# Patient Record
Sex: Female | Born: 1960
Health system: Southern US, Community
[De-identification: ages and names within clinical notes are randomized; demographics above are authoritative.]

## PROBLEM LIST (undated history)

## (undated) DIAGNOSIS — D689 Coagulation defect, unspecified: Secondary | ICD-10-CM

## (undated) DIAGNOSIS — Z9289 Personal history of other medical treatment: Secondary | ICD-10-CM

## (undated) DIAGNOSIS — R011 Cardiac murmur, unspecified: Secondary | ICD-10-CM

## (undated) DIAGNOSIS — M1990 Unspecified osteoarthritis, unspecified site: Secondary | ICD-10-CM

## (undated) DIAGNOSIS — M810 Age-related osteoporosis without current pathological fracture: Secondary | ICD-10-CM

## (undated) DIAGNOSIS — I2510 Atherosclerotic heart disease of native coronary artery without angina pectoris: Secondary | ICD-10-CM

## (undated) DIAGNOSIS — J189 Pneumonia, unspecified organism: Secondary | ICD-10-CM

## (undated) DIAGNOSIS — E785 Hyperlipidemia, unspecified: Secondary | ICD-10-CM

## (undated) DIAGNOSIS — K920 Hematemesis: Secondary | ICD-10-CM

## (undated) DIAGNOSIS — R569 Unspecified convulsions: Secondary | ICD-10-CM

## (undated) DIAGNOSIS — L409 Psoriasis, unspecified: Secondary | ICD-10-CM

## (undated) DIAGNOSIS — J45909 Unspecified asthma, uncomplicated: Secondary | ICD-10-CM

## (undated) DIAGNOSIS — R51 Headache: Secondary | ICD-10-CM

## (undated) DIAGNOSIS — I509 Heart failure, unspecified: Secondary | ICD-10-CM

## (undated) DIAGNOSIS — Z87442 Personal history of urinary calculi: Secondary | ICD-10-CM

## (undated) DIAGNOSIS — I214 Non-ST elevation (NSTEMI) myocardial infarction: Secondary | ICD-10-CM

## (undated) DIAGNOSIS — K5902 Outlet dysfunction constipation: Secondary | ICD-10-CM

## (undated) DIAGNOSIS — J42 Unspecified chronic bronchitis: Secondary | ICD-10-CM

## (undated) DIAGNOSIS — N200 Calculus of kidney: Secondary | ICD-10-CM

## (undated) DIAGNOSIS — R519 Headache, unspecified: Secondary | ICD-10-CM

## (undated) DIAGNOSIS — K759 Inflammatory liver disease, unspecified: Secondary | ICD-10-CM

## (undated) DIAGNOSIS — K2211 Ulcer of esophagus with bleeding: Secondary | ICD-10-CM

## (undated) DIAGNOSIS — I25110 Atherosclerotic heart disease of native coronary artery with unstable angina pectoris: Secondary | ICD-10-CM

## (undated) DIAGNOSIS — I951 Orthostatic hypotension: Secondary | ICD-10-CM

## (undated) DIAGNOSIS — K219 Gastro-esophageal reflux disease without esophagitis: Secondary | ICD-10-CM

## (undated) DIAGNOSIS — K223 Perforation of esophagus: Secondary | ICD-10-CM

## (undated) DIAGNOSIS — Z8719 Personal history of other diseases of the digestive system: Secondary | ICD-10-CM

## (undated) DIAGNOSIS — K859 Acute pancreatitis without necrosis or infection, unspecified: Secondary | ICD-10-CM

## (undated) HISTORY — DX: Hematemesis: K92.0

## (undated) HISTORY — DX: Atherosclerotic heart disease of native coronary artery with unstable angina pectoris: I25.110

## (undated) HISTORY — DX: Calculus of kidney: N20.0

## (undated) HISTORY — PX: OVARIAN CYST SURGERY: SHX726

## (undated) HISTORY — PX: CYSTOSCOPY WITH STENT PLACEMENT: SHX5790

## (undated) HISTORY — PX: KNEE ARTHROSCOPY: SHX127

## (undated) HISTORY — DX: Unspecified osteoarthritis, unspecified site: M19.90

## (undated) HISTORY — DX: Ulcer of esophagus with bleeding: K22.11

## (undated) HISTORY — DX: Unspecified convulsions: R56.9

## (undated) HISTORY — DX: Heart failure, unspecified: I50.9

## (undated) HISTORY — PX: UPPER GASTROINTESTINAL ENDOSCOPY: SHX188

## (undated) HISTORY — DX: Orthostatic hypotension: I95.1

## (undated) HISTORY — PX: NASAL SINUS SURGERY: SHX719

## (undated) HISTORY — DX: Outlet dysfunction constipation: K59.02

## (undated) HISTORY — DX: Coagulation defect, unspecified: D68.9

## (undated) HISTORY — PX: COLONOSCOPY: SHX174

## (undated) HISTORY — PX: BACK SURGERY: SHX140

## (undated) HISTORY — DX: Age-related osteoporosis without current pathological fracture: M81.0

## (undated) HISTORY — DX: Perforation of esophagus: K22.3

## (undated) HISTORY — DX: Psoriasis, unspecified: L40.9

---
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
## 1983-02-13 DIAGNOSIS — Z9289 Personal history of other medical treatment: Secondary | ICD-10-CM | POA: Insufficient documentation

## 1983-02-13 HISTORY — DX: Personal history of other medical treatment: Z92.89

## 1987-02-13 HISTORY — PX: DILATION AND CURETTAGE OF UTERUS: SHX78

## 1988-02-13 HISTORY — PX: ABDOMINAL HYSTERECTOMY: SHX81

## 1989-02-12 DIAGNOSIS — K859 Acute pancreatitis without necrosis or infection, unspecified: Secondary | ICD-10-CM

## 1989-02-12 DIAGNOSIS — K759 Inflammatory liver disease, unspecified: Secondary | ICD-10-CM

## 1989-02-12 HISTORY — PX: BILE DUCT EXPLORATION: SHX1225

## 1989-02-12 HISTORY — DX: Inflammatory liver disease, unspecified: K75.9

## 1989-02-12 HISTORY — PX: CHOLECYSTECTOMY OPEN: SUR202

## 1989-02-12 HISTORY — DX: Acute pancreatitis without necrosis or infection, unspecified: K85.90

## 1990-02-12 HISTORY — PX: APPENDECTOMY: SHX54

## 2016-11-04 DIAGNOSIS — I249 Acute ischemic heart disease, unspecified: Secondary | ICD-10-CM

## 2016-11-04 DIAGNOSIS — R079 Chest pain, unspecified: Secondary | ICD-10-CM

## 2016-11-05 ENCOUNTER — Ambulatory Visit (HOSPITAL_COMMUNITY): Admit: 2016-11-05 | Payer: Self-pay | Admitting: Cardiology

## 2016-11-05 ENCOUNTER — Inpatient Hospital Stay (HOSPITAL_COMMUNITY)
Admission: AD | Disposition: A | Discharge status: Home / Self Care | Payer: Self-pay | Source: Other Acute Inpatient Hospital | Attending: Cardiology

## 2016-11-05 ENCOUNTER — Encounter (HOSPITAL_COMMUNITY): Payer: Self-pay | Admitting: Cardiology

## 2016-11-05 ENCOUNTER — Inpatient Hospital Stay (HOSPITAL_COMMUNITY)
Admission: AD | Admit: 2016-11-05 | Discharge: 2016-11-07 | DRG: 247 | Disposition: A | Discharge status: Home / Self Care | Payer: Self-pay | Source: Other Acute Inpatient Hospital | Attending: Cardiology | Admitting: Cardiology

## 2016-11-05 DIAGNOSIS — J45909 Unspecified asthma, uncomplicated: Secondary | ICD-10-CM | POA: Diagnosis present

## 2016-11-05 DIAGNOSIS — Z8249 Family history of ischemic heart disease and other diseases of the circulatory system: Secondary | ICD-10-CM

## 2016-11-05 DIAGNOSIS — R748 Abnormal levels of other serum enzymes: Secondary | ICD-10-CM

## 2016-11-05 DIAGNOSIS — I447 Left bundle-branch block, unspecified: Secondary | ICD-10-CM

## 2016-11-05 DIAGNOSIS — I2510 Atherosclerotic heart disease of native coronary artery without angina pectoris: Secondary | ICD-10-CM | POA: Diagnosis present

## 2016-11-05 DIAGNOSIS — I214 Non-ST elevation (NSTEMI) myocardial infarction: Secondary | ICD-10-CM | POA: Insufficient documentation

## 2016-11-05 DIAGNOSIS — Z955 Presence of coronary angioplasty implant and graft: Secondary | ICD-10-CM

## 2016-11-05 DIAGNOSIS — Z888 Allergy status to other drugs, medicaments and biological substances status: Secondary | ICD-10-CM

## 2016-11-05 DIAGNOSIS — D649 Anemia, unspecified: Secondary | ICD-10-CM | POA: Diagnosis present

## 2016-11-05 DIAGNOSIS — E876 Hypokalemia: Secondary | ICD-10-CM | POA: Diagnosis present

## 2016-11-05 DIAGNOSIS — R079 Chest pain, unspecified: Secondary | ICD-10-CM

## 2016-11-05 HISTORY — DX: Pneumonia, unspecified organism: J18.9

## 2016-11-05 HISTORY — PX: CORONARY ANGIOPLASTY WITH STENT PLACEMENT: SHX49

## 2016-11-05 HISTORY — DX: Personal history of other medical treatment: Z92.89

## 2016-11-05 HISTORY — DX: Unspecified asthma, uncomplicated: J45.909

## 2016-11-05 HISTORY — DX: Unspecified chronic bronchitis: J42

## 2016-11-05 HISTORY — DX: Acute pancreatitis without necrosis or infection, unspecified: K85.90

## 2016-11-05 HISTORY — DX: Gastro-esophageal reflux disease without esophagitis: K21.9

## 2016-11-05 HISTORY — DX: Headache: R51

## 2016-11-05 HISTORY — PX: CORONARY STENT INTERVENTION: CATH118234

## 2016-11-05 HISTORY — DX: Left bundle-branch block, unspecified: I44.7

## 2016-11-05 HISTORY — DX: Headache, unspecified: R51.9

## 2016-11-05 HISTORY — DX: Non-ST elevation (NSTEMI) myocardial infarction: I21.4

## 2016-11-05 HISTORY — DX: Cardiac murmur, unspecified: R01.1

## 2016-11-05 HISTORY — DX: Personal history of urinary calculi: Z87.442

## 2016-11-05 HISTORY — DX: Inflammatory liver disease, unspecified: K75.9

## 2016-11-05 HISTORY — PX: LEFT HEART CATH AND CORONARY ANGIOGRAPHY: CATH118249

## 2016-11-05 LAB — CBC
HEMATOCRIT: 28.7 % — AB (ref 36.0–46.0)
Hemoglobin: 9.5 g/dL — ABNORMAL LOW (ref 12.0–15.0)
MCH: 28.1 pg (ref 26.0–34.0)
MCHC: 33.1 g/dL (ref 30.0–36.0)
MCV: 84.9 fL (ref 78.0–100.0)
PLATELETS: 484 10*3/uL — AB (ref 150–400)
RBC: 3.38 MIL/uL — AB (ref 3.87–5.11)
RDW: 16.2 % — AB (ref 11.5–15.5)
WBC: 8.1 10*3/uL (ref 4.0–10.5)

## 2016-11-05 LAB — CREATININE, SERUM: Creatinine, Ser: 0.59 mg/dL (ref 0.44–1.00)

## 2016-11-05 LAB — TROPONIN I
TROPONIN I: UNDETERMINED ng/mL (ref <0.03)
Troponin I: 4.02 ng/mL (ref <0.03)

## 2016-11-05 LAB — POCT ACTIVATED CLOTTING TIME: Activated Clotting Time: 219 seconds

## 2016-11-05 SURGERY — LEFT HEART CATH AND CORONARY ANGIOGRAPHY
Anesthesia: LOCAL

## 2016-11-05 MED ORDER — TICAGRELOR 90 MG PO TABS
ORAL_TABLET | ORAL | Status: AC
  Filled 2016-11-05: qty 1

## 2016-11-05 MED ORDER — FENTANYL CITRATE (PF) 100 MCG/2ML IJ SOLN
INTRAMUSCULAR | Status: AC
  Filled 2016-11-05: qty 2

## 2016-11-05 MED ORDER — FENTANYL CITRATE (PF) 100 MCG/2ML IJ SOLN: INTRAMUSCULAR | Status: DC | PRN

## 2016-11-05 MED ORDER — SODIUM CHLORIDE 0.9% FLUSH: 3.0000 mL | Freq: Two times a day (BID) | INTRAVENOUS | Status: DC

## 2016-11-05 MED ORDER — HEPARIN SODIUM (PORCINE) 1000 UNIT/ML IJ SOLN
INTRAMUSCULAR | Status: AC
  Filled 2016-11-05: qty 1

## 2016-11-05 MED ORDER — FAMOTIDINE IN NACL 20-0.9 MG/50ML-% IV SOLN
INTRAVENOUS | Status: AC
  Filled 2016-11-05: qty 50

## 2016-11-05 MED ORDER — TICAGRELOR 90 MG PO TABS
ORAL_TABLET | ORAL | Status: DC | PRN
  Administered 2016-11-05: 180 mg via ORAL

## 2016-11-05 MED ORDER — VERAPAMIL HCL 2.5 MG/ML IV SOLN
INTRAVENOUS | Status: DC | PRN
  Administered 2016-11-05: 10 mL via INTRA_ARTERIAL

## 2016-11-05 MED ORDER — METOPROLOL TARTRATE 5 MG/5ML IV SOLN
INTRAVENOUS | Status: DC | PRN
  Administered 2016-11-05: 5 mg via INTRAVENOUS

## 2016-11-05 MED ORDER — ENOXAPARIN SODIUM 40 MG/0.4ML ~~LOC~~ SOLN
40.0000 mg | SUBCUTANEOUS | Status: DC
  Administered 2016-11-06: 40 mg via SUBCUTANEOUS
  Filled 2016-11-05 (×2): qty 0.4

## 2016-11-05 MED ORDER — ONDANSETRON HCL 4 MG/2ML IJ SOLN: 4.0000 mg | Freq: Four times a day (QID) | INTRAMUSCULAR | Status: DC | PRN

## 2016-11-05 MED ORDER — LIDOCAINE HCL (PF) 1 % IJ SOLN
INTRAMUSCULAR | Status: DC | PRN
  Administered 2016-11-05: 2 mL via INTRADERMAL

## 2016-11-05 MED ORDER — NITROGLYCERIN IN D5W 200-5 MCG/ML-% IV SOLN
INTRAVENOUS | Status: AC
Start: 2016-11-05 — End: 2016-11-05
  Filled 2016-11-05: qty 250

## 2016-11-05 MED ORDER — IOPAMIDOL (ISOVUE-370) INJECTION 76%
INTRAVENOUS | Status: DC | PRN
  Administered 2016-11-05: 150 mL via INTRA_ARTERIAL

## 2016-11-05 MED ORDER — THE SENSUOUS HEART BOOK
Freq: Once | Status: AC
  Administered 2016-11-05: 20:00:00
  Filled 2016-11-05: qty 1

## 2016-11-05 MED ORDER — NITROGLYCERIN 1 MG/10 ML FOR IR/CATH LAB
INTRA_ARTERIAL | Status: DC | PRN
  Administered 2016-11-05: 200 ug via INTRACORONARY

## 2016-11-05 MED ORDER — METOPROLOL TARTRATE 5 MG/5ML IV SOLN
INTRAVENOUS | Status: AC
  Filled 2016-11-05: qty 5

## 2016-11-05 MED ORDER — ENSURE ENLIVE PO LIQD
237.0000 mL | Freq: Two times a day (BID) | ORAL | Status: DC
  Administered 2016-11-06 (×2): 237 mL via ORAL

## 2016-11-05 MED ORDER — SODIUM CHLORIDE 0.9 % IV SOLN: 250.0000 mL | INTRAVENOUS | Status: DC | PRN

## 2016-11-05 MED ORDER — MIDAZOLAM HCL 2 MG/2ML IJ SOLN
INTRAMUSCULAR | Status: DC | PRN
  Administered 2016-11-05 (×3): 1 mg via INTRAVENOUS

## 2016-11-05 MED ORDER — SODIUM CHLORIDE 0.9% FLUSH: 3.0000 mL | INTRAVENOUS | Status: DC | PRN

## 2016-11-05 MED ORDER — ASPIRIN 81 MG PO CHEW
81.0000 mg | CHEWABLE_TABLET | Freq: Every day | ORAL | Status: DC
  Administered 2016-11-06 – 2016-11-07 (×2): 81 mg via ORAL
  Filled 2016-11-05 (×2): qty 1

## 2016-11-05 MED ORDER — ACETAMINOPHEN 325 MG PO TABS
650.0000 mg | ORAL_TABLET | ORAL | Status: DC | PRN
  Administered 2016-11-05 – 2016-11-06 (×5): 650 mg via ORAL
  Filled 2016-11-05 (×6): qty 2

## 2016-11-05 MED ORDER — HEPARIN (PORCINE) IN NACL 2-0.9 UNIT/ML-% IJ SOLN
INTRAMUSCULAR | Status: DC | PRN
  Administered 2016-11-05: 1000 mL

## 2016-11-05 MED ORDER — HEART ATTACK BOUNCING BOOK
Freq: Once | Status: AC
  Administered 2016-11-05: 20:00:00
  Filled 2016-11-05: qty 1

## 2016-11-05 MED ORDER — METOPROLOL TARTRATE 50 MG PO TABS
50.0000 mg | ORAL_TABLET | Freq: Two times a day (BID) | ORAL | Status: DC
Start: 2016-11-05 — End: 2016-11-07
  Administered 2016-11-05 – 2016-11-07 (×4): 50 mg via ORAL
  Filled 2016-11-05 (×4): qty 1

## 2016-11-05 MED ORDER — SODIUM CHLORIDE 0.9 % WEIGHT BASED INFUSION: 1.0000 mL/kg/h | INTRAVENOUS | Status: DC

## 2016-11-05 MED ORDER — VERAPAMIL HCL 2.5 MG/ML IV SOLN
INTRAVENOUS | Status: AC
  Filled 2016-11-05: qty 2

## 2016-11-05 MED ORDER — TICAGRELOR 90 MG PO TABS
90.0000 mg | ORAL_TABLET | Freq: Two times a day (BID) | ORAL | Status: DC
  Administered 2016-11-06 – 2016-11-07 (×4): 90 mg via ORAL
  Filled 2016-11-05 (×4): qty 1

## 2016-11-05 MED ORDER — NITROGLYCERIN IN D5W 200-5 MCG/ML-% IV SOLN: 0.0000 ug/min | INTRAVENOUS | Status: DC

## 2016-11-05 MED ORDER — SODIUM CHLORIDE 0.9 % WEIGHT BASED INFUSION: 3.0000 mL/kg/h | INTRAVENOUS | Status: DC

## 2016-11-05 MED ORDER — MIDAZOLAM HCL 2 MG/2ML IJ SOLN
INTRAMUSCULAR | Status: AC
  Filled 2016-11-05: qty 2

## 2016-11-05 MED ORDER — IOPAMIDOL (ISOVUE-370) INJECTION 76%
INTRAVENOUS | Status: AC
  Filled 2016-11-05: qty 100

## 2016-11-05 MED ORDER — ATORVASTATIN CALCIUM 80 MG PO TABS
80.0000 mg | ORAL_TABLET | Freq: Every day | ORAL | Status: DC
  Administered 2016-11-05 – 2016-11-06 (×2): 80 mg via ORAL
  Filled 2016-11-05 (×2): qty 1

## 2016-11-05 MED ORDER — SODIUM CHLORIDE 0.9 % WEIGHT BASED INFUSION: 1.0000 mL/kg/h | INTRAVENOUS | Status: AC

## 2016-11-05 MED ORDER — NITROGLYCERIN 1 MG/10 ML FOR IR/CATH LAB
INTRA_ARTERIAL | Status: AC
  Filled 2016-11-05: qty 10

## 2016-11-05 MED ORDER — HEPARIN (PORCINE) IN NACL 2-0.9 UNIT/ML-% IJ SOLN
INTRAMUSCULAR | Status: AC
  Filled 2016-11-05: qty 1000

## 2016-11-05 MED ORDER — SODIUM CHLORIDE 0.9% FLUSH
3.0000 mL | Freq: Two times a day (BID) | INTRAVENOUS | Status: DC
  Administered 2016-11-06 – 2016-11-07 (×2): 3 mL via INTRAVENOUS

## 2016-11-05 MED ORDER — ALPRAZOLAM 0.5 MG PO TABS
1.0000 mg | ORAL_TABLET | Freq: Three times a day (TID) | ORAL | Status: DC | PRN
  Administered 2016-11-05 – 2016-11-06 (×2): 1 mg via ORAL
  Filled 2016-11-05 (×2): qty 2

## 2016-11-05 MED ORDER — LORAZEPAM 2 MG/ML IJ SOLN
INTRAMUSCULAR | Status: DC | PRN
  Administered 2016-11-05: 1 mg via INTRAVENOUS

## 2016-11-05 MED ORDER — ANGIOPLASTY BOOK
Freq: Once | Status: AC
  Administered 2016-11-05: 20:00:00
  Filled 2016-11-05: qty 1

## 2016-11-05 MED ORDER — NITROGLYCERIN IN D5W 200-5 MCG/ML-% IV SOLN
INTRAVENOUS | Status: AC | PRN
  Administered 2016-11-05: 10 ug/min via INTRAVENOUS

## 2016-11-05 MED ORDER — FENTANYL CITRATE (PF) 100 MCG/2ML IJ SOLN
INTRAMUSCULAR | Status: DC | PRN
  Administered 2016-11-05 (×3): 25 ug via INTRAVENOUS

## 2016-11-05 MED ORDER — HEPARIN SODIUM (PORCINE) 1000 UNIT/ML IJ SOLN
INTRAMUSCULAR | Status: DC | PRN
  Administered 2016-11-05 (×3): 3000 [IU] via INTRAVENOUS

## 2016-11-05 MED ORDER — LORAZEPAM 2 MG/ML IJ SOLN
INTRAMUSCULAR | Status: AC
  Filled 2016-11-05: qty 1

## 2016-11-05 SURGICAL SUPPLY — 20 items
BALLN SAPPHIRE 2.5X12 (BALLOONS) ×2
BALLN SAPPHIRE ~~LOC~~ 4.0X15 (BALLOONS) ×2 IMPLANT
BALLN ~~LOC~~ EMERGE MR 4.5X8 (BALLOONS) ×2
BALLOON SAPPHIRE 2.5X12 (BALLOONS) ×1 IMPLANT
BALLOON ~~LOC~~ EMERGE MR 4.5X8 (BALLOONS) ×1 IMPLANT
CATH 5FR JL3.5 JR4 ANG PIG MP (CATHETERS) ×2 IMPLANT
CATH VISTA GUIDE 6FR XBLAD3.5 (CATHETERS) ×2 IMPLANT
DEVICE RAD COMP TR BAND LRG (VASCULAR PRODUCTS) ×2 IMPLANT
GLIDESHEATH SLEND SS 6F .021 (SHEATH) ×2 IMPLANT
GUIDEWIRE INQWIRE 1.5J.035X260 (WIRE) ×1 IMPLANT
INQWIRE 1.5J .035X260CM (WIRE) ×2
KIT ENCORE 26 ADVANTAGE (KITS) ×2 IMPLANT
KIT HEART LEFT (KITS) ×2 IMPLANT
PACK CARDIAC CATHETERIZATION (CUSTOM PROCEDURE TRAY) ×2 IMPLANT
STENT PROMUS PREM MR 3.5X20 (Permanent Stent) ×2 IMPLANT
SYR MEDRAD MARK V 150ML (SYRINGE) ×2 IMPLANT
TRANSDUCER W/STOPCOCK (MISCELLANEOUS) ×2 IMPLANT
TUBING CIL FLEX 10 FLL-RA (TUBING) ×2 IMPLANT
WIRE ASAHI PROWATER 180CM (WIRE) ×4 IMPLANT
WIRE HI TORQ VERSACORE-J 145CM (WIRE) ×2 IMPLANT

## 2016-11-05 NOTE — Progress Notes (Signed)
1630 patient arrived from cath lab with complaints of chest pain, improved from pain in cath lab but still present. Patient and daughter informed that pain can be present because of spasm and irritation after procedure. Pain improved and patient reported feeling better after she relaxed and rested. Patient complained of sharp chest pain, stated it started  after air released from TR band. Site stable, Nitroglycerin infusion increased from 10 mcg to 15 mcg for pain. Instructed patient it would take a bit of time before pain decreased, verbalized understanding. 1930 reported relief from pain , pain decreased, achy with no more sharp pain.

## 2016-11-05 NOTE — Progress Notes (Signed)
Pt tranferred from [facility] via [place]. Pt alert and oriented, IV Heparin infusing and family at bedside. Pt denies any CP or any discomfort at this time. Heparin drip stopped and NS started.  NP in to see pt.  Consent will be signed, pt on bedside monitor  and daughter at bedside.  Pt waiting for cath procedure.

## 2016-11-05 NOTE — Progress Notes (Signed)
TR BAND REMOVAL  LOCATION:    right radial  DEFLATED PER PROTOCOL:    Yes.    TIME BAND OFF / DRESSING APPLIED:    21:15   SITE UPON ARRIVAL:    Level 1  SITE AFTER BAND REMOVAL:    Level 1(Bruise)  CIRCULATION SENSATION AND MOVEMENT:    Within Normal Limits   Yes  COMMENTS:   Post TR band instructions given. Pt tolerated well.

## 2016-11-05 NOTE — Interval H&P Note (Signed)
History and Physical Interval Note:  11/05/2016 2:23 PM  Tierra Thoma  has presented today for surgery, with the diagnosis of cp  The various methods of treatment have been discussed with the patient and family. After consideration of risks, benefits and other options for treatment, the patient has consented to  Procedure(s): LEFT HEART CATH AND CORONARY ANGIOGRAPHY (N/A) as a surgical intervention .  The patient's history has been reviewed, patient examined, no change in status, stable for surgery.  I have reviewed the patient's chart and labs.  Questions were answered to the patient's satisfaction.   Cath Lab Visit (complete for each Cath Lab visit)  Clinical Evaluation Leading to the Procedure:   ACS: Yes.    Non-ACS:    Anginal Classification: CCS IV  Anti-ischemic medical therapy: No Therapy  Non-Invasive Test Results: No non-invasive testing performed  Prior CABG: No previous CABG        [name] [facility] 11/05/2016 2:24 PM

## 2016-11-05 NOTE — Care Management Note (Addendum)
Case Management Note  Patient Details  Name: Gabrielle Page MRN: [id number] Date of Birth: 04-07-1960  Subjective/Objective:   From home, presents with NSTEMI, s/p coronary stent intervention, will be on brilinta, has no insurance listed or PCP listed.  NCM scheduled an apt at the [facility] for Oct 17 at 1 pm for hospital follow up and financial counseling services on Oct 3 at 10 am.  NCM gave patient the brochures for each apt and the 30 day free savings coupon for brilinta.  She will also be able to get other meds she is taking now like lipitor, imdur and lopressor at the [facility] clinic for $4 or $10 and patient states she can afford that.                  Action/Plan: NCM will follow for dc needs.   Expected Discharge Date:                  Expected Discharge Plan:  Home/Self Care  In-House Referral:  Clinical Social Work  Discharge planning Services  CM Consult, Follow-up appt scheduled, [facility], Medication Assistance  Post Acute Care Choice:    Choice offered to:     DME Arranged:    DME Agency:     HH Arranged:    [place] Agency:     Status of Service:  Completed, signed off  If discussed at [employer], dates discussed:    Additional Comments:  Zenon Mayo, RN 11/05/2016, 3:59 PM

## 2016-11-05 NOTE — H&P (Signed)
History & Physical    Patient ID: Gabrielle Page MRN: [id number], DOB/AGE: 54/17/1963   Admit date: 11/05/2016  Primary Physician: No primary care provider on file. Primary Cardiologist: New  Patient Profile    56 yo female with PMH of asthma who presented to [facility] with chest pain and found to have positive troponin.   Past Medical History   Past Medical History:  Diagnosis Date  . Asthma     No past surgical history on file.   Allergies  Allergies not on file  History of Present Illness    Gabrielle Page is a 56 yo female with no known hx of CAD. Denies any family hx of CAD. Reports that Gabrielle Page normally pretty active, and does not normally have chest pain. Noticed a couple of weeks ago that Gabrielle Page began to have a tightness in her chest, particularly when Gabrielle Page was walking up the hill from the mailbox to her house. Thought it was her asthma. This morning Gabrielle Page was awakened from her sleep at 5am with sharp left sided chest pain with radiation into the left arm. Was also diaphoretic and slightly short of breath. Drove herself to the ED at [facility]. There her initial EKG showed SR with new LBBB, and TWI in lead I, aVL and v6. Gabrielle Page was given fentanyl and started on IV heparin. Given 324 mg ASA. Also had nitro paste placed which resolved her pain. Follow up EKG showed resolved LBBB, no TWI. Troponin noted at 1.4. Cr 0.9, Hgb 11.6. CXR negative. CTA showed concern for CAD, noting the LAD, no dissection. Given symptoms and labs Gabrielle Page was transferred to [facility] for cardiac cath.   Home Medications    Prior to Admission medications   Not on File    Family History    Family History  Problem Relation Age of Onset  . Hypertension Father     Social History    Social History   Social History  . Marital status: Divorced    Spouse name: N/A  . Number of children: N/A  . Years of education: N/A   Occupational History  . Not on file.   Social History Main Topics  . Smoking status: Not  on file  . Smokeless tobacco: Not on file  . Alcohol use Not on file  . Drug use: Unknown  . Sexual activity: Not on file   Other Topics Concern  . Not on file   Social History Narrative  . No narrative on file     Review of Systems    See HPI  All other systems reviewed and are otherwise negative except as noted above.  Physical Exam    Blood pressure 113/78, pulse 87, resp. rate 17, SpO2 98 %.  General: Pleasant, NAD Psych: Normal affect. Neuro: Alert and oriented X 3. Moves all extremities spontaneously. HEENT: Normal  Neck: Supple without bruits or JVD. Lungs:  Resp regular and unlabored, CTA. Heart: RRR no s3, s4, or murmurs. Abdomen: Soft, non-tender, non-distended, BS + x 4.  Extremities: No clubbing, cyanosis or edema. DP/PT/Radials 2+ and equal bilaterally.  Labs    Troponin (Point of Care Test) No results for input(s): TROPIPOC in the last 72 hours. No results for input(s): CKTOTAL, CKMB, TROPONINI in the last 72 hours. No results found for: WBC, HGB, HCT, MCV, PLT No results for input(s): NA, K, CL, CO2, BUN, CREATININE, CALCIUM, PROT, BILITOT, ALKPHOS, ALT, AST, GLUCOSE in the last 168 hours.  Invalid input(s): LABALBU No results found for:  CHOL, HDL, LDLCALC, TRIG No results found for: [facility]   Radiology Studies    No results found.  ECG & Cardiac Imaging    EKG: Initial EKG showed LBBB with TWI in lead I, aVL, and v6, with follow up showing resolved LBBB and no TWI.   Assessment & Plan    56 yo female with PMH of asthma who presented to [facility] with chest pain and found to have positive troponin.   NSTEMI: Transferred from [facility] for cardiac cath. Troponin 1.44. Placed on IV heparin, and nitro paste. Initial EKG showed new LBBB, but resolved after nitro and improvement of pain.  -- plan for cardiac cath -- The patient understands that risks included but are not limited to stroke (1 in 1000), death (1 in 1000), kidney failure [usually  temporary] (1 in 500), bleeding (1 in 200), allergic reaction [possibly serious] (1 in 200).  -- further recommendations post cath   Signed, Reino Bellis, NP-C Pager [phone] 11/05/2016, 1:13 PM Patient seen and examined and history reviewed. Agree with above findings and plan. 56 yo WF previously in good health presented to [facility] with acute chest pain. Troponin elevated to 1.4. Ecg shows NSR with LBBB.  On exam Gabrielle Page is anxious. No current chest pain. VSS No JVD or bruits. Lungs clear.  CV RRR without gallop, murmur or click.  Abd soft NT no masses. Pulses 2+ throughout. No edema  Impression: NSTEMI.  Plan: check lipid status, A1c. On IV heparin and topical nitrates.  Plan cardiac cath +/- PCI today. Procedure and risks reviewed with patient.   Ziggy Chanthavong [place], [place] 11/05/2016 2:20 PM

## 2016-11-06 ENCOUNTER — Encounter (HOSPITAL_COMMUNITY): Payer: Self-pay | Admitting: Cardiology

## 2016-11-06 LAB — BASIC METABOLIC PANEL
ANION GAP: 8 (ref 5–15)
BUN: 15 mg/dL (ref 6–20)
CO2: 20 mmol/L — ABNORMAL LOW (ref 22–32)
Calcium: 8.8 mg/dL — ABNORMAL LOW (ref 8.9–10.3)
Chloride: 108 mmol/L (ref 101–111)
Creatinine, Ser: 0.57 mg/dL (ref 0.44–1.00)
Glucose, Bld: 127 mg/dL — ABNORMAL HIGH (ref 65–99)
POTASSIUM: 3.3 mmol/L — AB (ref 3.5–5.1)
SODIUM: 136 mmol/L (ref 135–145)

## 2016-11-06 LAB — CBC
HEMATOCRIT: 27.1 % — AB (ref 36.0–46.0)
HEMOGLOBIN: 8.7 g/dL — AB (ref 12.0–15.0)
MCH: 27.1 pg (ref 26.0–34.0)
MCHC: 32.1 g/dL (ref 30.0–36.0)
MCV: 84.4 fL (ref 78.0–100.0)
Platelets: 435 10*3/uL — ABNORMAL HIGH (ref 150–400)
RBC: 3.21 MIL/uL — AB (ref 3.87–5.11)
RDW: 15.7 % — ABNORMAL HIGH (ref 11.5–15.5)
WBC: 8.3 10*3/uL (ref 4.0–10.5)

## 2016-11-06 LAB — LIPID PANEL
CHOLESTEROL: 132 mg/dL (ref 0–200)
HDL: 36 mg/dL — AB (ref >40)
LDL CALC: 54 mg/dL (ref 0–99)
TRIGLYCERIDES: 211 mg/dL — AB (ref <150)
Total CHOL/HDL Ratio: 3.7 RATIO
VLDL: 42 mg/dL — AB (ref 0–40)

## 2016-11-06 LAB — HEMOGLOBIN A1C
Hgb A1c MFr Bld: 5.3 % (ref 4.8–5.6)
Mean Plasma Glucose: 105.41 mg/dL

## 2016-11-06 LAB — TROPONIN I
TROPONIN I: 3.58 ng/mL — AB (ref <0.03)
Troponin I: 4.97 ng/mL (ref <0.03)
Troponin I: 4.72 ng/mL (ref <0.03)
Troponin I: 3.89 ng/mL (ref <0.03)

## 2016-11-06 LAB — POCT ACTIVATED CLOTTING TIME: ACTIVATED CLOTTING TIME: 356 s

## 2016-11-06 MED ORDER — POTASSIUM CHLORIDE CRYS ER 20 MEQ PO TBCR
40.0000 meq | EXTENDED_RELEASE_TABLET | Freq: Once | ORAL | Status: AC
  Administered 2016-11-06: 40 meq via ORAL
  Filled 2016-11-06: qty 2

## 2016-11-06 MED ORDER — ISOSORBIDE MONONITRATE ER 30 MG PO TB24
15.0000 mg | ORAL_TABLET | Freq: Every day | ORAL | Status: DC
  Administered 2016-11-06 – 2016-11-07 (×2): 15 mg via ORAL
  Filled 2016-11-06 (×2): qty 1

## 2016-11-06 MED FILL — Lidocaine HCl Local Inj 2%: INTRAMUSCULAR | Qty: 10 | Status: AC

## 2016-11-06 NOTE — Progress Notes (Signed)
Nutrition Brief Note  Patient identified on the Malnutrition Screening Tool (MST) Report  Wt Readings from Last 15 Encounters:  11/06/16 134 lb 1.6 oz (60.8 kg)   56 yo female with PMH of asthma who presented to [facility] with chest pain and found to have positive troponin.   Spoke with pt, who endorses a 10# wt loss over the past 1-2 months. She reports UBW around 140-145#, however, no wt hx available to assess wt changes. 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   Pt shares she usually consumes 2 meals per day (Breakfast: grits, oatmeal, toast, coffee, Snacks: cake,  Dinner: meat, starch, and vegetable). She has been consuming 100% of meals in the hospital.   Nutrition-Focused physical exam completed. Findings are no fat depletion, no muscle depletion, and no edema. Pt reports she thinks her clothing is looser and feels more "bony". However, no physical signs of fat and muscle depletion noted.  Pt reports she has been thinking about modifying her diet to contain less salt and fat, as well as becoming more active. She reveals that cardiac rehab has done education with her, but politely declined further education needs.   Body mass index is 23.75 kg/m. Patient meets criteria for normal weight range based on current BMI.   Current diet order is Heart Healthy, patient is consuming approximately 100% of meals at this time. Labs and medications reviewed.   No nutrition interventions warranted at this time. If nutrition issues arise, please consult RD.   Nathalya Wolanski A. Jimmye Norman, RD, LDN, CDE Pager: [phone] After hours Pager: [phone]

## 2016-11-06 NOTE — Progress Notes (Signed)
The patient has been seen in conjunction with Reino Bellis, NP-C. All aspects of care have been considered and discussed. The patient has been personally interviewed, examined, and all clinical data has been reviewed.   No chest discomfort since early last evening.  ECG this morning reveals no evidence of ischemia.  Radial cath site is unremarkable.  Digital images were reviewed and as noted below the lesion was very hard and did not completely expand in the mid stent although was adequately dilated at high pressure. Minimal distal stent margin disruption but with good flow. Diagonal has good flow. Mild elevation in troponin secondary to interventional procedure.  Plan aggressive risk factor modification. Plan ambulation today and if clinically stable discharge tomorrow.   Progress Note  Patient Name: Gabrielle Page Date of Encounter: 11/06/2016  Primary Cardiologist: [place]  Subjective   Had some residual chest pain post cath. Still on nitro gtt this morning, but ambulated with cardiac rehab.   Inpatient Medications    Scheduled Meds: . aspirin  81 mg Oral Daily  . atorvastatin  80 mg Oral q1800  . enoxaparin (LOVENOX) injection  40 mg Subcutaneous Q24H  . feeding supplement (ENSURE ENLIVE)  237 mL Oral BID BM  . metoprolol tartrate  50 mg Oral BID  . sodium chloride flush  3 mL Intravenous Q12H  . ticagrelor  90 mg Oral BID   Continuous Infusions: . sodium chloride    . nitroGLYCERIN 10 mcg/min (11/06/16 0700)   PRN Meds: sodium chloride, acetaminophen, ALPRAZolam, ondansetron (ZOFRAN) IV, sodium chloride flush   Vital Signs    Vitals:   11/05/16 1930 11/05/16 2000 11/05/16 2100 11/06/16 0616  BP: 126/69 123/77 120/86 109/74  Pulse: 88 87 90 71  Resp: (!) 26 20 15 17   Temp: 98.2 F (36.8 C)   97.6 F (36.4 C)  TempSrc: Oral   Oral  SpO2: 99% 100% 98% 100%  Weight:    134 lb 1.6 oz (60.8 kg)  Height:        Intake/Output Summary (Last 24 hours)  at 11/06/16 0853 Last data filed at 11/06/16 0615  Gross per 24 hour  Intake          1167.31 ml  Output              800 ml  Net           367.31 ml   Filed Weights   11/05/16 1630 11/06/16 0616  Weight: 134 lb 1.6 oz (60.8 kg) 134 lb 1.6 oz (60.8 kg)    Telemetry    SR - Personally Reviewed  ECG    SR with new TWI in anterior leads - Personally Reviewed  Physical Exam   General: Well developed, well nourished, female appearing in no acute distress. Head: Normocephalic, atraumatic.  Neck: Supple without bruits, JVD. Lungs:  Resp regular and unlabored, CTA. Heart: RRR, S1, S2, no S3, S4, or murmur; no rub. Abdomen: Soft, non-tender, non-distended with normoactive bowel sounds. No hepatomegaly. No rebound/guarding. No obvious abdominal masses. Extremities: No clubbing, cyanosis, edema. Distal pedal pulses are 2+ bilaterally. Neuro: Alert and oriented X 3. Moves all extremities spontaneously. Psych: Normal affect.  Labs    Chemistry Recent Labs Lab 11/05/16 1633 11/06/16 0019  NA  --  136  K  --  3.3*  CL  --  108  CO2  --  20*  GLUCOSE  --  127*  BUN  --  15  CREATININE 0.59 0.57  CALCIUM  --  8.8*  GFRNONAA >60 >60  GFRAA >60 >60  ANIONGAP  --  8     Hematology Recent Labs Lab 11/05/16 1633 11/06/16 0019  WBC 8.1 8.3  RBC 3.38* 3.21*  HGB 9.5* 8.7*  HCT 28.7* 27.1*  MCV 84.9 84.4  MCH 28.1 27.1  MCHC 33.1 32.1  RDW 16.2* 15.7*  PLT 484* 435*    Cardiac Enzymes Recent Labs Lab 11/05/16 1633 11/05/16 1825 11/06/16 0018  TROPONINI QUANTITY NOT SUFFICIENT, UNABLE TO PERFORM TEST 4.02* 3.58*   No results for input(s): TROPIPOC in the last 168 hours.   BNPNo results for input(s): BNP, PROBNP in the last 168 hours.   DDimer No results for input(s): DDIMER in the last 168 hours.    Radiology    No results found.  Cardiac Studies   Cath: 11/05/16  Conclusion     Ost LM to LM lesion, 25 %stenosed.  The left ventricular systolic  function is normal.  LV end diastolic pressure is normal.  The left ventricular ejection fraction is 55-65% by visual estimate.  A STENT PROMUS PREM MR 3.5X20 drug eluting stent was successfully placed.  Prox LAD to Mid LAD lesion, 90 %stenosed.  Post intervention, there is a 0% residual stenosis.   1. Single vessel obstructive CAD    - 90% proximal to mid LAD 2. Normal LV function 3. Normal LVEDP 4. Successful stenting of the proximal to mid LAD with DES  Plan: DAPT for one year. High dose statin. Will continue IV Ntg overnight. Start beta blocker.    Digital images were personally reviewed. Incomplete mid stent expansion although dilated aggressively with noncompliant balloon at high pressure. Distal stent margin with some mild disruption but no flow limitation. Jailed diagonal has good flow.  Patient Profile     56 y.o. female with no known PMH of CAD who presented to [facility] ER with chest pain. Found to have an NSTEMI and transferred to [facility]   Assessment & Plan    1. NSTEMI: Troponin peaked at 4.08. Underwent successful PCI/DES to the p/mLAD. Plan for DAPT with ASA/Brilinta. Normal EF noted on LV gram. Has residual chest pain post cath, and was continued on nitro drip. Will Dc this morning. Add low dose Imdur. LDL at goal, and Hgb A1c 5.3 -- continue BB, statin, ASA, Brilinta  2. Anemia: Drop in Hgb 11.6>>9.5>>8.7. No s/s of bleeding.  -- follow up CBC in the am  3. Hypokalemia: Replete x1   Signed, Reino Bellis, NP  11/06/2016, 8:53 AM  Pager # [phone]   For questions or updates, please contact [place] Please consult [email or web address] for contact info under Cardiology/STEMI. Daytime calls, contact the Day Call APP (6a-8a) or assigned team (Teams A-D) provider (7:30a - 5p). All other daytime calls (7:30-5p), contact the Card Master @ [phone].   Nighttime calls, contact the assigned APP (5p-8p) or MD (6:30p-8p). Overnight calls (8p-6a), contact the on  call Fellow @ [phone].

## 2016-11-06 NOTE — Progress Notes (Signed)
CARDIAC REHAB PHASE I   PRE:  Rate/Rhythm: 72 SR  BP:  Sitting: 91/60   MODE:  Ambulation: 400 ft   POST:  Rate/Rhythm: 86 SR  BP:  Sitting: 118/76         SaO2: 100 RA  Pt ambulated 400 ft on RA, IV, handheld assist, steady gait, tolerated well.  Pt c/o mild soreness in her chest, denies any other complaints, declined rest stop. Completed MI/stent education with pt and significant other at bedside.  Reviewed risk factors, MI/PCI books, anti-platelet therapy, stent card, activity restrictions, ntg, exercise, heart healthy diet and  phase 2 cardiac rehab. Pt verbalized understanding, receptive to education, anxious. Pt agrees to phase 2 cardiac rehab referral, will send to [facility] per pt request. Pt does not have insurance, case manager to see prior to discharge regarding brilinta. Pt to recliner after walk, call bell within reach.     [place], RN, BSN 11/06/2016 9:15 AM

## 2016-11-07 ENCOUNTER — Encounter (HOSPITAL_COMMUNITY): Payer: Self-pay | Admitting: Cardiology

## 2016-11-07 ENCOUNTER — Telehealth: Payer: Self-pay | Admitting: Cardiology

## 2016-11-07 DIAGNOSIS — I25118 Atherosclerotic heart disease of native coronary artery with other forms of angina pectoris: Secondary | ICD-10-CM

## 2016-11-07 LAB — BASIC METABOLIC PANEL
ANION GAP: 7 (ref 5–15)
BUN: 12 mg/dL (ref 6–20)
CO2: 27 mmol/L (ref 22–32)
Calcium: 9.3 mg/dL (ref 8.9–10.3)
Chloride: 103 mmol/L (ref 101–111)
Creatinine, Ser: 0.52 mg/dL (ref 0.44–1.00)
Glucose, Bld: 92 mg/dL (ref 65–99)
POTASSIUM: 3.7 mmol/L (ref 3.5–5.1)
Sodium: 137 mmol/L (ref 135–145)

## 2016-11-07 LAB — CBC
HEMATOCRIT: 30.5 % — AB (ref 36.0–46.0)
Hemoglobin: 9.8 g/dL — ABNORMAL LOW (ref 12.0–15.0)
MCH: 27.2 pg (ref 26.0–34.0)
MCHC: 32.1 g/dL (ref 30.0–36.0)
MCV: 84.7 fL (ref 78.0–100.0)
Platelets: 433 10*3/uL — ABNORMAL HIGH (ref 150–400)
RBC: 3.6 MIL/uL — AB (ref 3.87–5.11)
RDW: 15.7 % — ABNORMAL HIGH (ref 11.5–15.5)
WBC: 6.9 10*3/uL (ref 4.0–10.5)

## 2016-11-07 MED ORDER — ASPIRIN 81 MG PO CHEW: 81.0000 mg | CHEWABLE_TABLET | Freq: Every day | ORAL | Status: DC

## 2016-11-07 MED ORDER — TICAGRELOR 90 MG PO TABS: 90.0000 mg | ORAL_TABLET | Freq: Two times a day (BID) | ORAL | 0 refills | Status: DC

## 2016-11-07 MED ORDER — NITROGLYCERIN 0.4 MG SL SUBL: 0.4000 mg | SUBLINGUAL_TABLET | SUBLINGUAL | 0 refills | Status: DC | PRN

## 2016-11-07 MED ORDER — ISOSORBIDE MONONITRATE ER 30 MG PO TB24: 15.0000 mg | ORAL_TABLET | Freq: Every day | ORAL | 0 refills | Status: DC

## 2016-11-07 MED ORDER — ATORVASTATIN CALCIUM 80 MG PO TABS: 80.0000 mg | ORAL_TABLET | Freq: Every day | ORAL | 0 refills | Status: DC

## 2016-11-07 MED ORDER — METOPROLOL TARTRATE 50 MG PO TABS: 50.0000 mg | ORAL_TABLET | Freq: Two times a day (BID) | ORAL | 0 refills | Status: DC

## 2016-11-07 MED FILL — ATORVASTATIN 80 MG TABLET: 80 | 30 days supply | Qty: 30 | Fill #0

## 2016-11-07 MED FILL — BRILINTA 90 MG TABLET: 90 | 8 days supply | Qty: 16 | Fill #0

## 2016-11-07 MED FILL — ?METOPROLOL 50 MG TABLET: 50 | 30 days supply | Qty: 60 | Fill #0

## 2016-11-07 MED FILL — ISOSORBIDE MN ER 30 MG TAB: 30 | 30 days supply | Qty: 15 | Fill #0

## 2016-11-07 NOTE — Telephone Encounter (Signed)
Left message for pt regarding TOC.

## 2016-11-07 NOTE — Progress Notes (Signed)
CARDIAC REHAB PHASE I   PRE:  Rate/Rhythm: 67 SR  BP:  Sitting: 103/65   MODE:  Ambulation: 600 ft   POST:  Rate/Rhythm: 83 SR  BP:  Sitting: 116/82       Pt ambulated 600 ft on RA, independent, steady gait, tolerated well with no complaints. Pt states she feels much better today, eager to go home. Briefly reviewed education, pt able to perform teach back, states she has no questions at this time other than whether or not she should continue taking premarin, advised pt to discuss with MD. Pt to recliner after walk, call bell within reach.   [phone] Lenna Sciara, RN, BSN 11/07/2016 8:35 AM

## 2016-11-07 NOTE — Discharge Summary (Signed)
The patient has been seen in conjunction with Reino Bellis, NP-C. All aspects of care have been considered and discussed. The patient has been personally interviewed, examined, and all clinical data has been reviewed.   Still with vague dyspnea. No chest discomfort.  Successful stenting of the borderline significant PDA. Previously stented LAD remains pristine. No postprocedure complications. Radial site unremarkable.  Continue dual antiplatelet therapy.  Clinical follow-up 7-14 days.   Discharge Summary    Patient ID: Gabrielle Page,  MRN: [id number], DOB/AGE: 1960/06/13 56 y.o.  Admit date: 11/05/2016 Discharge date: 11/07/2016  Primary Care Provider: [name] Primary Cardiologist: Martinique   Discharge Diagnoses    Principal Problem:   Non-ST elevation (NSTEMI) myocardial infarction [facility]) Active Problems:   LBBB (left bundle branch block)   NSTEMI (non-ST elevated myocardial infarction) ([place])   Allergies Allergies  Allergen Reactions  . Compazine [Prochlorperazine Edisylate] Other (See Comments)    siezure  . Other Other (See Comments)    Steroids cause uncontrollable coughing    Diagnostic Studies/Procedures    Cath: 11/05/16  Conclusion     Ost LM to LM lesion, 25 %stenosed.  The left ventricular systolic function is normal.  LV end diastolic pressure is normal.  The left ventricular ejection fraction is 55-65% by visual estimate.  A STENT PROMUS PREM MR 3.5X20 drug eluting stent was successfully placed.  Prox LAD to Mid LAD lesion, 90 %stenosed.  Post intervention, there is a 0% residual stenosis.   1. Single vessel obstructive CAD    - 90% proximal to mid LAD 2. Normal LV function 3. Normal LVEDP 4. Successful stenting of the proximal to mid LAD with DES  Plan: DAPT for one year. High dose statin. Will continue IV Ntg overnight. Start beta blocker.   _____________   History of Present Illness     Mrs. Gabrielle Page is a 56 yo  female with no known hx of CAD. Denied any family hx of CAD. Reported that she is normally pretty active, and does not normally have chest pain. Noticed a couple of weeks prior to admission that she began to have a tightness in her chest, particularly when she was walking up the hill from the mailbox to her house. Thought it was her asthma. The morning of admission she was awakened from her sleep at 5am with sharp left sided chest pain with radiation into the left arm. Was also diaphoretic and slightly short of breath. Drove herself to the ED at [facility]. There her initial EKG showed SR with new LBBB, and TWI in lead I, aVL and v6. She was given fentanyl and started on IV heparin. Given 324 mg ASA. Also had nitro paste placed which resolved her pain. Follow up EKG showed resolved LBBB, no TWI. Troponin noted at 1.4. Cr 0.9, Hgb 11.6. CXR negative. CTA showed concern for CAD, noting the LAD, no dissection. Given symptoms and labs she was transferred to [facility] for cardiac cath.   Hospital Course     Underwent cath with Dr. Martinique noted above with successful PCI/DES to the p/mLAD. Plan for DAPT with ASA/Brilinta for at least one year. Normal EF noted on LV gram. Troponin peaked at 4.97. She did have residual chest pain post cath and remained on IV nitro. This was weaned and she was placed on Imdur. No further chest pain. LDL 54, and Hgb A1c 5.3. Did have a mild drop in Hgb to 8.7, but this improved to 9.8 prior to discharge. She walked  with cardiac rehab without any complications. CM assisted with getting her set up with PCP follow up appt and assistance program with medications.   General: Well developed, well nourished, female appearing in no acute distress. Head: Normocephalic, atraumatic.  Neck: Supple without bruits, JVD. Lungs:  Resp regular and unlabored, CTA. Heart: RRR, S1, S2, no S3, S4, or murmur; no rub. Abdomen: Soft, non-tender, non-distended with normoactive bowel sounds. No hepatomegaly. No  rebound/guarding. No obvious abdominal masses. Extremities: No clubbing, cyanosis, edema. Distal pedal pulses are 2+ bilaterally. R radial cath site stable without bruising or hematoma Neuro: Alert and oriented X 3. Moves all extremities spontaneously. Psych: Normal affect.  Gabrielle Page was seen by Dr. Tamala Julian and determined stable for discharge home. Follow up in the office has been arranged. Medications are listed below.   _____________  Discharge Vitals Blood pressure 103/65, pulse 73, temperature 97.8 F (36.6 C), temperature source Oral, resp. rate 15, height 5\' 3"  (1.6 m), weight 139 lb 1.8 oz (63.1 kg), SpO2 99 %.  Filed Weights   11/05/16 1630 11/06/16 0616 11/07/16 0636  Weight: 134 lb 1.6 oz (60.8 kg) 134 lb 1.6 oz (60.8 kg) 139 lb 1.8 oz (63.1 kg)    Labs & Radiologic Studies    CBC  Recent Labs  11/06/16 0019 11/07/16 0540  WBC 8.3 6.9  HGB 8.7* 9.8*  HCT 27.1* 30.5*  MCV 84.4 84.7  PLT 435* 106*   Basic Metabolic Panel  Recent Labs  11/06/16 0019 11/07/16 0540  NA 136 137  K 3.3* 3.7  CL 108 103  CO2 20* 27  GLUCOSE 127* 92  BUN 15 12  CREATININE 0.57 0.52  CALCIUM 8.8* 9.3   Liver Function Tests No results for input(s): AST, ALT, ALKPHOS, BILITOT, PROT, ALBUMIN in the last 72 hours. No results for input(s): LIPASE, AMYLASE in the last 72 hours. Cardiac Enzymes  Recent Labs  11/06/16 1148 11/06/16 1755 11/06/16 2238  TROPONINI 4.97* 4.72* 3.89*   BNP Invalid input(s): POCBNP D-Dimer No results for input(s): DDIMER in the last 72 hours. Hemoglobin A1C  Recent Labs  11/06/16 0017  HGBA1C 5.3   Fasting Lipid Panel  Recent Labs  11/06/16 0018  CHOL 132  HDL 36*  LDLCALC 54  TRIG 211*  CHOLHDL 3.7   Thyroid Function Tests No results for input(s): TSH, T4TOTAL, T3FREE, THYROIDAB in the last 72 hours.  Invalid input(s): FREET3 _____________  No results found. Disposition   Pt is being discharged home today in good  condition.  Follow-up Plans & Appointments    Follow-up [facility] Follow up on 11/28/2016.   Why:  1 pm for hospital follow up Contact information: [place] [phone] [place] Follow up on 11/28/2016.   Why:  for financial services counseling Contact information: [place] [id number] [phone]       [facility], [place] Follow up on 11/20/2016.   Specialties:  Cardiology, Radiology Why:  at 10am for your follow up appt.  Contact information: [address] [place] [address] [phone]          Discharge Instructions    Amb Referral to Cardiac Rehabilitation    Complete by:  As directed    Diagnosis:   Coronary Stents Comment - to [place] NSTEMI  Discharge Medications     Medication List    STOP taking these medications   estradiol 0.5 MG tablet Commonly known as:  ESTRACE     TAKE these medications   aspirin 81 MG chewable tablet Chew 1 tablet (81 mg total) by mouth daily.   atorvastatin 80 MG tablet Commonly known as:  LIPITOR Take 1 tablet (80 mg total) by mouth daily at 6 PM.   isosorbide mononitrate 30 MG 24 hr tablet Commonly known as:  IMDUR Take 0.5 tablets (15 mg total) by mouth daily.   metoprolol tartrate 50 MG tablet Commonly known as:  LOPRESSOR Take 1 tablet (50 mg total) by mouth 2 (two) times daily.   montelukast 10 MG tablet Commonly known as:  SINGULAIR Take 10 mg by mouth at bedtime.   multivitamin with minerals Tabs tablet Take 1 tablet by mouth daily.   nitroGLYCERIN 0.4 MG SL tablet Commonly known as:  NITROSTAT Place 1 tablet (0.4 mg total) under the tongue every 5 (five) minutes as needed.   ticagrelor 90 MG Tabs tablet Commonly known as:  BRILINTA Take 1 tablet (90 mg total) by mouth 2 (two) times daily.         Aspirin prescribed at discharge?  Yes High Intensity Statin Prescribed? (Lipitor 40-80mg  or Crestor 20-40mg ): Yes Beta Blocker Prescribed? Yes For EF <40%, was ACEI/ARB Prescribed? No: EF ADP Receptor Inhibitor Prescribed? (i.e. Plavix etc.-Includes Medically Managed Patients): Yes For EF <40%, Aldosterone Inhibitor Prescribed? No: EF ok  Was EF assessed during THIS hospitalization? Yes Was Cardiac Rehab II ordered? (Included Medically managed Patients): Yes   Outstanding Labs/Studies   FLP/LFTs in 6 weeks.   Duration of Discharge Encounter   Greater than 30 minutes including physician time.  Signed, Reino Bellis NP-C 11/07/2016, 9:48 AM

## 2016-11-07 NOTE — Telephone Encounter (Signed)
New message     TOC appt made with Mendel Ryder.Appointment made 10/09 @10 :00 with Eulas Post

## 2016-11-08 NOTE — Telephone Encounter (Signed)
Left message to call back  

## 2016-11-12 NOTE — Telephone Encounter (Signed)
11/05/2016 - 11/07/2016 (2 days), [place], LEFT HEART CATH AND CORONARY ANGIOGRAPHY Appt 10/09 @10 :00 with Virgilio Belling

## 2016-11-13 NOTE — Telephone Encounter (Signed)
Tried to contact pt x3 will await call back since she is unreachable

## 2016-11-13 NOTE — Telephone Encounter (Signed)
Lm2cb 

## 2016-11-14 ENCOUNTER — Telehealth: Payer: Self-pay | Admitting: Physician Assistant

## 2016-11-14 ENCOUNTER — Ambulatory Visit: Payer: Self-pay

## 2016-11-14 MED FILL — BRILINTA 90 MG TABLET: 90 | 8 days supply | Qty: 16 | Fill #1

## 2016-11-14 NOTE — Telephone Encounter (Signed)
New message     Patient states she is having stomach cramps, thinks it may be coming from medication. Please call  Pt c/o medication issue:  1. Name of Medication:  atorvastatin (LIPITOR) 80 MG tablet isosorbide mononitrate (IMDUR) 30 MG 24 hr tablet metoprolol tartrate (LOPRESSOR) 50 MG tablet ticagrelor (BRILINTA) 90 MG TABS tablet  2. How are you currently taking this medication (dosage and times per day)? As prescribed   3. Are you having a reaction (difficulty breathing--STAT)?  NO  4. What is your medication issue? Not sure what medication is causing stomach cramps

## 2016-11-14 NOTE — Telephone Encounter (Signed)
Reviewed medication list w patient. She was recently discharged from hospital on DAPT, BB therapy, cholesterol med and nitrate. Notes concern for GI upset/stomach cramps, occurred intermittently since starting her new medications.  Reviewed w Raquel, she stated likely among these, lipitor would be safest to hold and one to potentially cause issue. Advised pt to hold lipitor and follow up as scheduled (will see Isaac Laud on the 9th for hosp f/u)  Informed pt Isaac Laud can advise further depending on her symptoms at that time. Pt voiced understanding and thanks.

## 2016-11-20 ENCOUNTER — Ambulatory Visit: Payer: Self-pay | Admitting: Physician Assistant

## 2016-11-22 ENCOUNTER — Ambulatory Visit: Payer: Self-pay | Attending: Internal Medicine

## 2016-11-22 MED FILL — NITROSTAT 0.4 MG TABLET SL: 0.4 | 25 days supply | Qty: 25 | Fill #0

## 2016-11-22 MED FILL — BRILINTA 90 MG TABLET: 90 | 8 days supply | Qty: 16 | Fill #2

## 2016-11-28 ENCOUNTER — Encounter: Payer: Self-pay | Admitting: Physician Assistant

## 2016-11-28 ENCOUNTER — Ambulatory Visit (INDEPENDENT_AMBULATORY_CARE_PROVIDER_SITE_OTHER): Payer: Self-pay | Admitting: Physician Assistant

## 2016-11-28 ENCOUNTER — Ambulatory Visit: Payer: Self-pay | Admitting: Family Medicine

## 2016-11-28 ENCOUNTER — Ambulatory Visit: Payer: Self-pay | Admitting: Physician Assistant

## 2016-11-28 VITALS — BP 112/62 | HR 59 | Ht 63.0 in | Wt 138.8 lb

## 2016-11-28 DIAGNOSIS — R079 Chest pain, unspecified: Secondary | ICD-10-CM

## 2016-11-28 DIAGNOSIS — Z79899 Other long term (current) drug therapy: Secondary | ICD-10-CM

## 2016-11-28 DIAGNOSIS — E785 Hyperlipidemia, unspecified: Secondary | ICD-10-CM

## 2016-11-28 DIAGNOSIS — I25119 Atherosclerotic heart disease of native coronary artery with unspecified angina pectoris: Secondary | ICD-10-CM

## 2016-11-28 MED ORDER — ISOSORBIDE MONONITRATE ER 30 MG PO TB24: 30.0000 mg | ORAL_TABLET | Freq: Every day | ORAL | 5 refills | Status: DC

## 2016-11-28 MED ORDER — PRAVASTATIN SODIUM 80 MG PO TABS: 80.0000 mg | ORAL_TABLET | Freq: Every evening | ORAL | 5 refills | Status: DC

## 2016-11-28 MED FILL — PRAVASTATIN NA 80 MG TAB: 80 | 30 days supply | Qty: 30 | Fill #0

## 2016-11-28 MED FILL — BRILINTA 90 MG TABLET: 90 | 6 days supply | Qty: 12 | Fill #3

## 2016-11-28 NOTE — Progress Notes (Signed)
Cardiology Office Note    Date:  11/30/2016   ID:  Gabrielle Page, DOB 1960/08/03, MRN [id number]  PCP:  [name], NP  Cardiologist:  Dr. Martinique  Chief Complaint  Patient presents with  . Follow-up    seen for Dr. Martinique    History of Present Illness:  Gabrielle Page is a 56 y.o. female with no prior cardiac history who recently presented to the hospital on 11/05/2016 with chest pain. EKG showed sinus rhythm with new left bundle-branch block. Troponin came back elevated, she was admitted for NSTEMI. Cardiac catheterization performed on 11/05/2016 showed 25% ostial left main, 90% proximal to mid LAD treated with 3.5 x 20 mm Promus Premiere DES, EF 55-65%. Initial hemoglobin was 9.5. Lipid panel obtained on 11/06/2016 showed HDL 36, LDL 54, triglyceride 211, total cholesterol 132. Hemoglobin A1c was 5.3. Based on Hospital record, there was incomplete and mid stent expansion although dilated aggressively with noncompliant balloon at high pressure. There was also some mild disruption at the distal stent margin with no flow limitation. Diagonal was jailed but had good flow.  She presents today for cardiology office visit. She had some chest discomfort on Saturday relieved with a single nitroglycerin, however since then she has not had any more chest discomfort. She had stomach cramps with Lipitor, Lipitor has been discontinued since. I will transition her to 80 mg daily of Pravachol. She will need of 6-8 weeks fasting lipid panel and LFT. Given the recent chest discomfort, I will also increase her Imdur to 30 mg daily. She has been instructed to contact cardiology service if she has any recurrence of chest discomfort and we can order a stress test if that happens. Otherwise she has been compliant with dual antiplatelet therapy. She does not smoke her only cardiac risk factor is age, family history and hyperlipidemia. She will need to control her lipids very well.   Past Medical  History:  Diagnosis Date  . Asthma   . Chronic bronchitis ([place])    "usually get it q yr" (11/05/2016)  . GERD (gastroesophageal reflux disease)   . Headache    "weekly" (11/05/2016)  . Heart murmur    "dx'd when I was a child"  . Hepatitis 1991   "when I had my gallbladder out" (11/05/2016)  . History of blood transfusion 1985   S/P childbirth  . History of kidney stones   . NSTEMI (non-ST elevated myocardial infarction) ([place]) 11/05/2016   9/18 PCI/DESx1 to p/mLAD  . Pancreatitis 1991  . Pneumonia    "twice" (11/05/2016)    Past Surgical History:  Procedure Laterality Date  . ABDOMINAL HYSTERECTOMY  1990  . APPENDECTOMY  1992  . BILE DUCT EXPLORATION  1991   "reconstruction"  . CESAREAN SECTION  1985  . CHOLECYSTECTOMY OPEN  1991  . CORONARY ANGIOPLASTY WITH STENT PLACEMENT  11/05/2016  . CORONARY STENT INTERVENTION N/A 11/05/2016   Procedure: CORONARY STENT INTERVENTION;  Surgeon: Martinique, Peter M, MD;  Location: [facility];  Service: Cardiovascular;  Laterality: N/A;  . CYSTOSCOPY WITH STENT PLACEMENT  "3 times" (11/05/2016)  . DILATION AND CURETTAGE OF UTERUS  1989  . KNEE ARTHROSCOPY Right 2000s  . LEFT HEART CATH AND CORONARY ANGIOGRAPHY N/A 11/05/2016   Procedure: LEFT HEART CATH AND CORONARY ANGIOGRAPHY;  Surgeon: Martinique, Peter M, MD;  Location: [facility];  Service: Cardiovascular;  Laterality: N/A;  . NASAL SINUS SURGERY  ~ 2009   "exposed to mildew; had to have the  infectoin drained; cut the outside of my face"  . OVARIAN CYST SURGERY  X 6    Current Medications: Outpatient Medications Prior to Visit  Medication Sig Dispense Refill  . aspirin 81 MG chewable tablet Chew 1 tablet (81 mg total) by mouth daily.    . metoprolol tartrate (LOPRESSOR) 50 MG tablet Take 1 tablet (50 mg total) by mouth 2 (two) times daily. 60 tablet 0  . montelukast (SINGULAIR) 10 MG tablet Take 10 mg by mouth at bedtime.    . Multiple Vitamin (MULTIVITAMIN WITH MINERALS) TABS  tablet Take 1 tablet by mouth daily.    . nitroGLYCERIN (NITROSTAT) 0.4 MG SL tablet Place 1 tablet (0.4 mg total) under the tongue every 5 (five) minutes as needed. 25 tablet 0  . atorvastatin (LIPITOR) 80 MG tablet Take 1 tablet (80 mg total) by mouth daily at 6 PM. 30 tablet 0  . isosorbide mononitrate (IMDUR) 30 MG 24 hr tablet Take 0.5 tablets (15 mg total) by mouth daily. 30 tablet 0  . ticagrelor (BRILINTA) 90 MG TABS tablet Take 1 tablet (90 mg total) by mouth 2 (two) times daily. 60 tablet 0   No facility-administered medications prior to visit.      Allergies:   Compazine [prochlorperazine edisylate] and Other   Social History   Social History  . Marital status: Divorced    Spouse name: N/A  . Number of children: N/A  . Years of education: N/A   Social History Main Topics  . Smoking status: Never Smoker  . Smokeless tobacco: Never Used  . Alcohol use Yes     Comment: 11/05/2016 "maybe 1 glass of wine/month"  . Drug use: No  . Sexual activity: Not Currently   Other Topics Concern  . None   Social History Narrative  . None     Family History:  The patient's family history includes Alcohol abuse in her father; Diabetes Mellitus II in her father; Heart attack in her father; Hypertension in her father; Suicidality in her mother.   ROS:   Please see the history of present illness.    ROS All other systems reviewed and are negative.   PHYSICAL EXAM:   VS:  BP 112/62   Pulse (!) 59   Ht 5\' 3"  (1.6 m)   Wt 138 lb 12.8 oz (63 kg)   BMI 24.59 kg/m    GEN: Well nourished, well developed, in no acute distress  HEENT: normal  Neck: no JVD, carotid bruits, or masses Cardiac: RRR; no murmurs, rubs, or gallops,no edema  Respiratory:  clear to auscultation bilaterally, normal work of breathing GI: soft, nontender, nondistended, + BS MS: no deformity or atrophy  Skin: warm and dry, no rash Neuro:  Alert and Oriented x 3, Strength and sensation are intact Psych: euthymic  mood, full affect  Wt Readings from Last 3 Encounters:  11/28/16 138 lb 12.8 oz (63 kg)  11/07/16 139 lb 1.8 oz (63.1 kg)      Studies/Labs Reviewed:   EKG:  EKG is ordered today.  The ekg ordered today demonstrates Sinus bradycardia, no significant ST-T wave changes  Recent Labs: 11/07/2016: BUN 12; Creatinine, Ser 0.52; Hemoglobin 9.8; Platelets 433; Potassium 3.7; Sodium 137   Lipid Panel    Component Value Date/Time   CHOL 132 11/06/2016 0018   TRIG 211 (H) 11/06/2016 0018   HDL 36 (L) 11/06/2016 0018   CHOLHDL 3.7 11/06/2016 0018   VLDL 42 (H) 11/06/2016 0018   LDLCALC 54  11/06/2016 0018    Additional studies/ records that were reviewed today include:   Cath 11/05/2016 Conclusion     Ost LM to LM lesion, 25 %stenosed.  The left ventricular systolic function is normal.  LV end diastolic pressure is normal.  The left ventricular ejection fraction is 55-65% by visual estimate.  A STENT PROMUS PREM MR 3.5X20 drug eluting stent was successfully placed.  Prox LAD to Mid LAD lesion, 90 %stenosed.  Post intervention, there is a 0% residual stenosis.   1. Single vessel obstructive CAD    - 90% proximal to mid LAD 2. Normal LV function 3. Normal LVEDP 4. Successful stenting of the proximal to mid LAD with DES      ASSESSMENT:    1. Coronary artery disease involving native coronary artery of native heart with angina pectoris ([place])   2. Dyslipidemia   3. Medication management   4. Chest pain, unspecified type      PLAN:  In order of problems listed above:  1. CAD: s/p DES to prox to mid LAD. Continue aspirin and Brilinta. She had a single episode of chest pain on Sunday that has resolved with nitroglycerin, this has not recurred since. I will increase her Imdur to 30 mg daily. If she does have recurrent chest pain, she has been instructed to contact cardiology service, we can consider outpatient Myoview.  2. Hyperlipidemia: Intolerant to Lipitor due to  stomach cramps, will change to Pravachol 80 mg daily. Will need fasting lipid panel and LFTs in 6-8 weeks.    Medication Adjustments/Labs and Tests Ordered: Current medicines are reviewed at length with the patient today.  Concerns regarding medicines are outlined above.  Medication changes, Labs and Tests ordered today are listed in the Patient Instructions below. Patient Instructions  Your physician has recommended you make the following change in your medication:  -- increase imdur to 30mg  daily (whole tablet) -- start pravastatin 80mg  daily -- stop atorvastatin  Your physician recommends that you return for lab work in: 6-8 weeks (fasting) - lipid, liver  If you have recurrent chest pain, please call our office  Your physician recommends that you schedule a follow-up appointment in: THREE MONTHS with Dr. Martinique        Signed, [place], [place]  11/30/2016 10:13 AM    [address], [place],   [address] Phone: [phone]; Fax: [phone]

## 2016-11-28 NOTE — Patient Instructions (Signed)
Your physician has recommended you make the following change in your medication:  -- increase imdur to 30mg  daily (whole tablet) -- start pravastatin 80mg  daily -- stop atorvastatin  Your physician recommends that you return for lab work in: 6-8 weeks (fasting) - lipid, liver  If you have recurrent chest pain, please call our office  Your physician recommends that you schedule a follow-up appointment in: [place] with Dr. Martinique

## 2016-11-29 ENCOUNTER — Other Ambulatory Visit: Payer: Self-pay | Admitting: Cardiology

## 2016-11-29 ENCOUNTER — Telehealth: Payer: Self-pay | Admitting: Physician Assistant

## 2016-11-29 MED ORDER — TICAGRELOR 90 MG PO TABS: 90.0000 mg | ORAL_TABLET | Freq: Two times a day (BID) | ORAL | 2 refills | Status: DC

## 2016-11-29 MED FILL — ISOSORBIDE MN ER 30 MG TAB: 30 | 30 days supply | Qty: 30 | Fill #0

## 2016-11-29 NOTE — Telephone Encounter (Signed)
New message     *STAT* If patient is at the pharmacy, call can be transferred to refill team.   1. Which medications need to be refilled? (please list name of each medication and dose if known) ticagrelor (BRILINTA) 90 MG TABS tablet  2. Which pharmacy/location (including street and city if local pharmacy) is [facility] to [facility] sent to[facility]  3. Do they need a 30 day or 90 day supply? [place]

## 2016-11-30 ENCOUNTER — Encounter: Payer: Self-pay | Admitting: Physician Assistant

## 2016-12-04 ENCOUNTER — Other Ambulatory Visit: Payer: Self-pay | Admitting: *Deleted

## 2016-12-04 MED ORDER — TICAGRELOR 90 MG PO TABS: 90.0000 mg | ORAL_TABLET | Freq: Two times a day (BID) | ORAL | 3 refills | Status: DC

## 2016-12-04 NOTE — Telephone Encounter (Signed)
PRINTED FOR PASS PROGRAM 

## 2016-12-06 ENCOUNTER — Telehealth: Payer: Self-pay | Admitting: Cardiology

## 2016-12-06 ENCOUNTER — Other Ambulatory Visit: Payer: Self-pay | Admitting: Cardiology

## 2016-12-06 ENCOUNTER — Other Ambulatory Visit: Payer: Self-pay | Admitting: *Deleted

## 2016-12-06 ENCOUNTER — Ambulatory Visit: Payer: Self-pay | Admitting: Physician Assistant

## 2016-12-06 MED ORDER — METOPROLOL TARTRATE 50 MG PO TABS: 50.0000 mg | ORAL_TABLET | Freq: Two times a day (BID) | ORAL | 2 refills | Status: DC

## 2016-12-06 MED ORDER — TICAGRELOR 90 MG PO TABS: 90.0000 mg | ORAL_TABLET | Freq: Two times a day (BID) | ORAL | 2 refills | Status: DC

## 2016-12-06 MED FILL — ?METOPROLOL 50 MG TABLET: 50 | 30 days supply | Qty: 60 | Fill #0

## 2016-12-06 MED FILL — BRILINTA 90 MG TABLET: 90 | 6 days supply | Qty: 12 | Fill #0

## 2016-12-06 NOTE — Telephone Encounter (Signed)
°*  STAT* If patient is at the pharmacy, call can be transferred to refill team.   1. Which medications need to be refilled? (please list name of each medication and dose if known) Metoprolol 50 mg twice daily ( needs a renewal )   2. Which pharmacy/location (including street and city if local pharmacy) is medication to be sent to?[place]  3. Do they need a 30 day or 90 day supply? [place]

## 2016-12-06 NOTE — Telephone Encounter (Signed)
New Message   Needs it called in asap to get this medication now before she heads back home   *STAT* If patient is at the pharmacy, call can be transferred to refill team.   1. Which medications need to be refilled? (please list name of each medication and dose if known)  Brilinta   2. Which pharmacy/location (including street and city if local pharmacy) [facility]   3. Do they need a 30 day or 90 day supply?  [place]

## 2016-12-13 MED FILL — BRILINTA 90 MG TABLET: 90 | 6 days supply | Qty: 12 | Fill #1

## 2016-12-18 MED FILL — BRILINTA 90 MG TABLET: 90 | 8 days supply | Qty: 16 | Fill #2

## 2016-12-26 MED FILL — BRILINTA 90 MG TABLET: 90 | 8 days supply | Qty: 16 | Fill #3

## 2017-01-01 MED FILL — PRAVASTATIN NA 80 MG TAB: 80 | 30 days supply | Qty: 30 | Fill #1

## 2017-01-01 MED FILL — ISOSORBIDE MN ER 30 MG TAB: 30 | 30 days supply | Qty: 30 | Fill #1

## 2017-01-02 MED FILL — BRILINTA 90 MG TABLET: 90 | 8 days supply | Qty: 16 | Fill #4

## 2017-01-08 MED FILL — BRILINTA 90 MG TABLET: 90 | 30 days supply | Qty: 60 | Fill #5

## 2017-01-09 MED FILL — ?METOPROLOL TARTRATE 50MG T: 50 | 30 days supply | Qty: 60 | Fill #1

## 2017-01-30 MED FILL — ?ISOSORBIDE MN 30 MG TAB SA: 30 | 30 days supply | Qty: 30 | Fill #2

## 2017-01-30 MED FILL — PRAVASTATIN NA 80 MG TAB: 80 | 30 days supply | Qty: 30 | Fill #2

## 2017-02-07 MED FILL — $BRILINTA 90 MG TABLET: 90 | 90 days supply | Qty: 180 | Fill #6

## 2017-02-19 MED FILL — ?METOPROLOL 50 MG TABLET: 50 | 30 days supply | Qty: 60 | Fill #2

## 2017-03-05 MED FILL — PRAVASTATIN SODIUM 80 MG TA: 80 | 30 days supply | Qty: 30 | Fill #3

## 2017-03-05 MED FILL — ?ISOSORBIDE MN 30 MG TAB SA: 30 | 30 days supply | Qty: 30 | Fill #3

## 2017-03-20 ENCOUNTER — Telehealth: Payer: Self-pay | Admitting: Cardiology

## 2017-03-20 DIAGNOSIS — R079 Chest pain, unspecified: Secondary | ICD-10-CM

## 2017-03-20 DIAGNOSIS — I2510 Atherosclerotic heart disease of native coronary artery without angina pectoris: Secondary | ICD-10-CM

## 2017-03-20 NOTE — Telephone Encounter (Signed)
New message    Pt c/o of Chest Pain: STAT if CP now or developed within 24 hours  1. Are you having CP right now? NO  2. Are you experiencing any other symptoms (ex. SOB, nausea, vomiting, sweating)? SOB  3. How long have you been experiencing CP? 2 WEEKS  4. Is your CP continuous or coming and going? COMING AND GOING  5. Have you taken Nitroglycerin? YES ?

## 2017-03-20 NOTE — Telephone Encounter (Signed)
Returned the call to the patient. She stated that she was having chest pain while at work and shortness of breath when exerting herself. She does not experience the symptoms while at rest. She had stents placed on 9/24 and has been compliant with her medications.   She refused an appointment for today. One was made for Monday 2/11. She has been advised and she confirmed that she will go to the ED if she feels worsening chest pian and shortness of breath.

## 2017-03-21 ENCOUNTER — Inpatient Hospital Stay (HOSPITAL_COMMUNITY)
Admission: AD | Admit: 2017-03-21 | Discharge: 2017-03-31 | DRG: 234 | Disposition: A | Discharge status: Home / Self Care | Payer: Self-pay | Source: Other Acute Inpatient Hospital | Attending: Cardiothoracic Surgery | Admitting: Cardiothoracic Surgery

## 2017-03-21 DIAGNOSIS — R079 Chest pain, unspecified: Secondary | ICD-10-CM

## 2017-03-21 DIAGNOSIS — Z09 Encounter for follow-up examination after completed treatment for conditions other than malignant neoplasm: Secondary | ICD-10-CM

## 2017-03-21 DIAGNOSIS — R945 Abnormal results of liver function studies: Secondary | ICD-10-CM

## 2017-03-21 DIAGNOSIS — I252 Old myocardial infarction: Secondary | ICD-10-CM

## 2017-03-21 DIAGNOSIS — Z7982 Long term (current) use of aspirin: Secondary | ICD-10-CM

## 2017-03-21 DIAGNOSIS — Z8249 Family history of ischemic heart disease and other diseases of the circulatory system: Secondary | ICD-10-CM

## 2017-03-21 DIAGNOSIS — I200 Unstable angina: Secondary | ICD-10-CM

## 2017-03-21 DIAGNOSIS — E785 Hyperlipidemia, unspecified: Secondary | ICD-10-CM | POA: Diagnosis present

## 2017-03-21 DIAGNOSIS — K219 Gastro-esophageal reflux disease without esophagitis: Secondary | ICD-10-CM | POA: Diagnosis present

## 2017-03-21 DIAGNOSIS — R7989 Other specified abnormal findings of blood chemistry: Secondary | ICD-10-CM

## 2017-03-21 DIAGNOSIS — Z9049 Acquired absence of other specified parts of digestive tract: Secondary | ICD-10-CM

## 2017-03-21 DIAGNOSIS — Z951 Presence of aortocoronary bypass graft: Secondary | ICD-10-CM

## 2017-03-21 DIAGNOSIS — Z90710 Acquired absence of both cervix and uterus: Secondary | ICD-10-CM

## 2017-03-21 DIAGNOSIS — E8770 Fluid overload, unspecified: Secondary | ICD-10-CM | POA: Diagnosis not present

## 2017-03-21 DIAGNOSIS — E876 Hypokalemia: Secondary | ICD-10-CM | POA: Diagnosis present

## 2017-03-21 DIAGNOSIS — D62 Acute posthemorrhagic anemia: Secondary | ICD-10-CM | POA: Diagnosis not present

## 2017-03-21 DIAGNOSIS — Z23 Encounter for immunization: Secondary | ICD-10-CM

## 2017-03-21 DIAGNOSIS — I2510 Atherosclerotic heart disease of native coronary artery without angina pectoris: Secondary | ICD-10-CM

## 2017-03-21 DIAGNOSIS — Z955 Presence of coronary angioplasty implant and graft: Secondary | ICD-10-CM

## 2017-03-21 DIAGNOSIS — I25110 Atherosclerotic heart disease of native coronary artery with unstable angina pectoris: Principal | ICD-10-CM | POA: Diagnosis present

## 2017-03-21 DIAGNOSIS — I447 Left bundle-branch block, unspecified: Secondary | ICD-10-CM

## 2017-03-21 DIAGNOSIS — J4520 Mild intermittent asthma, uncomplicated: Secondary | ICD-10-CM | POA: Diagnosis present

## 2017-03-21 LAB — CBC WITH DIFFERENTIAL/PLATELET
Basophils Absolute: 0 10*3/uL (ref 0.0–0.1)
Basophils Relative: 0 %
Eosinophils Absolute: 0.2 10*3/uL (ref 0.0–0.7)
Eosinophils Relative: 3 %
HCT: 29.8 % — ABNORMAL LOW (ref 36.0–46.0)
Hemoglobin: 9.7 g/dL — ABNORMAL LOW (ref 12.0–15.0)
Lymphocytes Relative: 30 %
Lymphs Abs: 1.9 10*3/uL (ref 0.7–4.0)
MCH: 29.3 pg (ref 26.0–34.0)
MCHC: 32.6 g/dL (ref 30.0–36.0)
MCV: 90 fL (ref 78.0–100.0)
Monocytes Absolute: 0.3 10*3/uL (ref 0.1–1.0)
Monocytes Relative: 5 %
Neutro Abs: 4.1 10*3/uL (ref 1.7–7.7)
Neutrophils Relative %: 62 %
Platelets: 268 10*3/uL (ref 150–400)
RBC: 3.31 MIL/uL — ABNORMAL LOW (ref 3.87–5.11)
RDW: 15.1 % (ref 11.5–15.5)
WBC: 6.4 10*3/uL (ref 4.0–10.5)

## 2017-03-21 LAB — COMPREHENSIVE METABOLIC PANEL
ALT: 20 U/L (ref 14–54)
AST: 21 U/L (ref 15–41)
Albumin: 3.5 g/dL (ref 3.5–5.0)
Alkaline Phosphatase: 51 U/L (ref 38–126)
Anion gap: 13 (ref 5–15)
BUN: 19 mg/dL (ref 6–20)
CO2: 23 mmol/L (ref 22–32)
Calcium: 9 mg/dL (ref 8.9–10.3)
Chloride: 105 mmol/L (ref 101–111)
Creatinine, Ser: 0.62 mg/dL (ref 0.44–1.00)
GFR calc Af Amer: >60 mL/min (ref >60)
GFR calc non Af Amer: >60 mL/min (ref >60)
Glucose, Bld: 111 mg/dL — ABNORMAL HIGH (ref 65–99)
Potassium: 3.6 mmol/L (ref 3.5–5.1)
Sodium: 141 mmol/L (ref 135–145)
Total Bilirubin: 0.3 mg/dL (ref 0.3–1.2)
Total Protein: 5.9 g/dL — ABNORMAL LOW (ref 6.5–8.1)

## 2017-03-21 LAB — TROPONIN I: Troponin I: <0.03 ng/mL (ref <0.03)

## 2017-03-21 MED ORDER — TICAGRELOR 90 MG PO TABS
90.0000 mg | ORAL_TABLET | Freq: Two times a day (BID) | ORAL | Status: DC
  Administered 2017-03-21: 90 mg via ORAL
  Filled 2017-03-21: qty 1

## 2017-03-21 MED ORDER — METOPROLOL TARTRATE 50 MG PO TABS
50.0000 mg | ORAL_TABLET | Freq: Two times a day (BID) | ORAL | Status: DC
  Administered 2017-03-21 – 2017-03-25 (×9): 50 mg via ORAL
  Filled 2017-03-21 (×9): qty 1

## 2017-03-21 MED ORDER — MONTELUKAST SODIUM 10 MG PO TABS
10.0000 mg | ORAL_TABLET | Freq: Every day | ORAL | Status: DC
  Administered 2017-03-21 – 2017-03-23 (×2): 10 mg via ORAL
  Filled 2017-03-21 (×3): qty 1

## 2017-03-21 MED ORDER — ASPIRIN EC 81 MG PO TBEC
81.0000 mg | DELAYED_RELEASE_TABLET | Freq: Every day | ORAL | Status: DC
  Administered 2017-03-23 – 2017-03-28 (×4): 81 mg via ORAL
  Filled 2017-03-21 (×4): qty 1

## 2017-03-21 MED ORDER — HEPARIN BOLUS VIA INFUSION
4000.0000 [IU] | Freq: Once | INTRAVENOUS | Status: AC
  Administered 2017-03-21: 4000 [IU] via INTRAVENOUS
  Filled 2017-03-21: qty 4000

## 2017-03-21 MED ORDER — ACETAMINOPHEN 325 MG PO TABS
650.0000 mg | ORAL_TABLET | ORAL | Status: DC | PRN
  Administered 2017-03-22 – 2017-03-23 (×2): 650 mg via ORAL
  Filled 2017-03-21 (×2): qty 2

## 2017-03-21 MED ORDER — PRAVASTATIN SODIUM 40 MG PO TABS
80.0000 mg | ORAL_TABLET | Freq: Every evening | ORAL | Status: DC
  Administered 2017-03-21 – 2017-03-22 (×2): 80 mg via ORAL
  Filled 2017-03-21 (×2): qty 2

## 2017-03-21 MED ORDER — ONDANSETRON HCL 4 MG/2ML IJ SOLN: 4.0000 mg | Freq: Four times a day (QID) | INTRAMUSCULAR | Status: DC | PRN

## 2017-03-21 MED ORDER — ADULT MULTIVITAMIN W/MINERALS CH
1.0000 | ORAL_TABLET | Freq: Every day | ORAL | Status: DC
  Administered 2017-03-23 – 2017-03-31 (×8): 1 via ORAL
  Filled 2017-03-21 (×8): qty 1

## 2017-03-21 MED ORDER — NITROGLYCERIN 0.4 MG SL SUBL
0.4000 mg | SUBLINGUAL_TABLET | SUBLINGUAL | Status: DC | PRN
  Administered 2017-03-22 – 2017-03-23 (×3): 0.4 mg via SUBLINGUAL
  Filled 2017-03-21 (×2): qty 1

## 2017-03-21 MED ORDER — HEPARIN (PORCINE) IN NACL 100-0.45 UNIT/ML-% IJ SOLN
800.0000 [IU]/h | INTRAMUSCULAR | Status: DC
  Administered 2017-03-21: 900 [IU]/h via INTRAVENOUS
  Filled 2017-03-21: qty 250

## 2017-03-21 NOTE — Progress Notes (Signed)
ANTICOAGULATION CONSULT NOTE - Initial Consult  Pharmacy Consult for heparin Indication: chest pain/ACS  Allergies  Allergen Reactions  . Compazine [Prochlorperazine Edisylate] Other (See Comments)    siezure  . Other Other (See Comments)    Steroids cause uncontrollable coughing    Patient Measurements: Height: 5\' 3"  (160 cm) Weight: 142 lb (64.4 kg) IBW/kg (Calculated) : 52.4 Heparin Dosing Weight: 64kg  Vital Signs: Temp: 97.5 F (36.4 C) (02/07 2107) BP: 116/62 (02/07 2107) Pulse Rate: 71 (02/07 2107)  Labs: No results for input(s): HGB, HCT, PLT, APTT, LABPROT, INR, HEPARINUNFRC, HEPRLOWMOCWT, CREATININE, CKTOTAL, CKMB, TROPONINI in the last 72 hours.  CrCl cannot be calculated (Patient's most recent lab result is older than the maximum 21 days allowed.).   Medical History: Past Medical History:  Diagnosis Date  . Asthma   . Chronic bronchitis ([place])    "usually get it q yr" (11/05/2016)  . GERD (gastroesophageal reflux disease)   . Headache    "weekly" (11/05/2016)  . Heart murmur    "dx'd when I was a child"  . Hepatitis 1991   "when I had my gallbladder out" (11/05/2016)  . History of blood transfusion 1985   S/P childbirth  . History of kidney stones   . NSTEMI (non-ST elevated myocardial infarction) ([place]) 11/05/2016   9/18 PCI/DESx1 to p/mLAD  . Pancreatitis 1991  . Pneumonia    "twice" (11/05/2016)    Assessment: 57 year old female with chest pain. New orders to start IV heparin.   Goal of Therapy:  Heparin level 0.3-0.7 units/ml Monitor platelets by anticoagulation protocol: Yes   Plan:  Give 4000 units bolus x 1 Start heparin infusion at 900 units/hr Check anti-Xa level in 6 hours and daily while on heparin Continue to monitor H&H and platelets  Erin Hearing PharmD., BCPS Clinical Pharmacist 03/21/2017 10:05 PM

## 2017-03-21 NOTE — H&P (Addendum)
Cardiology History & Physical    Patient ID: Gabrielle Page MRN: [id number], DOB: 1960-07-01 Date of Encounter: 03/21/2017, 10:50 PM Primary Physician: [name], NP  Chief Complaint: Chest pain, positive stress   HPI: Gabrielle Page is a 57 y.o. female with history of CAD status post and STEMI and 10/2016 (3.5 x 20 mm Promus DES to proximal/mid LAD, jailed D1), who presents with crescendo angina, found to have a markedly positive stress test.  Following her PCI in September 2018 patient did still have some residual chest pain.  This resolved with low-dose Imdur.  Since that time she had not had any chest pain until about a week prior to presentation where she noticed the relatively abrupt onset of significant chest pain and dyspnea with even minimal activity.  She ultimately presented to [facility], where she ruled out for MI with serial troponins.  She had a Lexiscan nuclear MPI, which showed a severe reversible anterior/anterolateral/anteroseptal defect consistent with ischemia in the LAD territory.  A heparin drip was started and she was sent to [facility] for cardiac catheterization.  Upon my interview, she denies any chest pain.  Her vital signs were reassuring within normal limits upon presentation here.  Past Medical History:  Diagnosis Date  . Asthma   . Chronic bronchitis ([place])    "usually get it q yr" (11/05/2016)  . GERD (gastroesophageal reflux disease)   . Headache    "weekly" (11/05/2016)  . Heart murmur    "dx'd when I was a child"  . Hepatitis 1991   "when I had my gallbladder out" (11/05/2016)  . History of blood transfusion 1985   S/P childbirth  . History of kidney stones   . NSTEMI (non-ST elevated myocardial infarction) ([place]) 11/05/2016   9/18 PCI/DESx1 to p/mLAD  . Pancreatitis 1991  . Pneumonia    "twice" (11/05/2016)     Surgical History:  Past Surgical History:  Procedure Laterality Date  . ABDOMINAL HYSTERECTOMY  1990  . APPENDECTOMY   1992  . BILE DUCT EXPLORATION  1991   "reconstruction"  . CESAREAN SECTION  1985  . CHOLECYSTECTOMY OPEN  1991  . CORONARY ANGIOPLASTY WITH STENT PLACEMENT  11/05/2016  . CORONARY STENT INTERVENTION N/A 11/05/2016   Procedure: CORONARY STENT INTERVENTION;  Surgeon: Martinique, Peter M, MD;  Location: [facility];  Service: Cardiovascular;  Laterality: N/A;  . CYSTOSCOPY WITH STENT PLACEMENT  "3 times" (11/05/2016)  . DILATION AND CURETTAGE OF UTERUS  1989  . KNEE ARTHROSCOPY Right 2000s  . LEFT HEART CATH AND CORONARY ANGIOGRAPHY N/A 11/05/2016   Procedure: LEFT HEART CATH AND CORONARY ANGIOGRAPHY;  Surgeon: Martinique, Peter M, MD;  Location: [facility];  Service: Cardiovascular;  Laterality: N/A;  . NASAL SINUS SURGERY  ~ 2009   "exposed to mildew; had to have the infectoin drained; cut the outside of my face"  . OVARIAN CYST SURGERY  X 6     Home Meds: Prior to Admission medications   Medication Sig Start Date End Date Taking? Authorizing Provider  aspirin 81 MG chewable tablet Chew 1 tablet (81 mg total) by mouth daily. 11/07/16   Cheryln Manly, NP  isosorbide mononitrate (IMDUR) 30 MG 24 hr tablet Take 1 tablet (30 mg total) by mouth daily. 11/28/16   Almyra Deforest, PA  metoprolol tartrate (LOPRESSOR) 50 MG tablet Take 1 tablet (50 mg total) by mouth 2 (two) times daily. 12/06/16   Martinique, Peter M, MD  montelukast (SINGULAIR) 10  MG tablet Take 10 mg by mouth at bedtime.    [provider]  Multiple Vitamin (MULTIVITAMIN WITH MINERALS) TABS tablet Take 1 tablet by mouth daily.    [provider]  nitroGLYCERIN (NITROSTAT) 0.4 MG SL tablet Place 1 tablet (0.4 mg total) under the tongue every 5 (five) minutes as needed. 11/07/16   Cheryln Manly, NP  pravastatin (PRAVACHOL) 80 MG tablet Take 1 tablet (80 mg total) by mouth every evening. 11/28/16 03/21/17  Almyra Deforest, PA  ticagrelor (BRILINTA) 90 MG TABS tablet Take 1 tablet (90 mg total) by mouth 2 (two) times  daily. 12/06/16   Martinique, Peter M, MD    Allergies:  Allergies  Allergen Reactions  . Compazine [Prochlorperazine Edisylate] Other (See Comments)    siezure  . Other Other (See Comments)    Steroids cause uncontrollable coughing    Social History   Socioeconomic History  . Marital status: Divorced    Spouse name: Not on file  . Number of children: Not on file  . Years of education: Not on file  . Highest education level: Not on file  Social Needs  . Financial resource strain: Not on file  . Food insecurity - worry: Not on file  . Food insecurity - inability: Not on file  . Transportation needs - medical: Not on file  . Transportation needs - non-medical: Not on file  Occupational History  . Not on file  Tobacco Use  . Smoking status: Never Smoker  . Smokeless tobacco: Never Used  Substance and Sexual Activity  . Alcohol use: Yes    Comment: 11/05/2016 "maybe 1 glass of wine/month"  . Drug use: No  . Sexual activity: Not Currently  Other Topics Concern  . Not on file  Social History Narrative  . Not on file     Family History  Problem Relation Age of Onset  . Hypertension Father   . Alcohol abuse Father   . Heart attack Father   . Diabetes Mellitus II Father   . Suicidality Mother     Review of Systems: All other systems reviewed and are otherwise negative except as noted above.  Labs:   Lab Results  Component Value Date   WBC 6.9 11/07/2016   HGB 9.8 (L) 11/07/2016   HCT 30.5 (L) 11/07/2016   MCV 84.7 11/07/2016   PLT 433 (H) 11/07/2016   No results for input(s): NA, K, CL, CO2, BUN, CREATININE, CALCIUM, PROT, BILITOT, ALKPHOS, ALT, AST, GLUCOSE in the last 168 hours.  Invalid input(s): LABALBU No results for input(s): CKTOTAL, CKMB, TROPONINI in the last 72 hours. Lab Results  Component Value Date   CHOL 132 11/06/2016   HDL 36 (L) 11/06/2016   LDLCALC 54 11/06/2016   TRIG 211 (H) 11/06/2016   No results found for: DDIMER  Radiology/Studies:   No results found. Wt Readings from Last 3 Encounters:  03/21/17 64.4 kg (142 lb)  11/28/16 63 kg (138 lb 12.8 oz)  11/07/16 63.1 kg (139 lb 1.8 oz)    OSH Labs reviewed; CBC, BMP, coags, and troponins all within normal limits.  Repeat labs ordered upon arrival here.  EKG: Ordered and pending  Physical Exam: Blood pressure 116/62, pulse 71, temperature (!) 97.5 F (36.4 C), resp. rate 16, height 5\' 3"  (1.6 m), weight 64.4 kg (142 lb), SpO2 96 %. Body mass index is 25.15 kg/m. General: Well developed, well nourished, in no acute distress. Head: Normocephalic, atraumatic, sclera non-icteric, no xanthomas, nares are  without discharge.  Neck: Negative for carotid bruits. JVD not elevated. Lungs: Clear bilaterally to auscultation without wheezes, rales, or rhonchi. Breathing is unlabored. Heart: RRR with S1 S2.  Soft SEM at base, no rubs or gallops appreciated. Abdomen: Soft, non-tender, non-distended with normoactive bowel sounds. No hepatomegaly. No rebound/guarding. No obvious abdominal masses. Msk:  Strength and tone appear normal for age. Extremities: No clubbing or cyanosis. No edema.  Distal pedal pulses are 2+ and equal bilaterally. Neuro: Alert and oriented X 3. No focal deficit. No facial asymmetry. Moves all extremities spontaneously. Psych:  Responds to questions appropriately with a normal affect.    Assessment and Plan  57 y.o. female with history of CAD status post and STEMI and 10/2016 (3.5 x 20 mm Promus DES to proximal/mid LAD, jailed D1), who presents with crescendo angina, found to have a markedly positive stress test.  1.  Unstable angina: Pain-free at present.  Severe reversible defect seen on myocardial perfusion imaging, as noted above.  This is worrisome for LAD territory ischemia.  Continue IV heparin drip, continue home Brilinta (she has been compliant with this without issue).  Holding home Margarita Sermons for now, if she has recurrent chest pain we will plan to use a  nitroglycerin drip to control symptoms.  Plan for cardiac catheterization in the morning, or urgently overnight if she has chest pain refractory to IV nitroglycerin.  2.  Hyperlipidemia: Continue home pravastatin.  Signed, Doylene Canning, MD 03/21/2017, 10:50 PM

## 2017-03-22 ENCOUNTER — Other Ambulatory Visit: Payer: Self-pay

## 2017-03-22 ENCOUNTER — Ambulatory Visit (HOSPITAL_COMMUNITY): Admission: RE | Admit: 2017-03-22 | Payer: Medicaid Other | Source: Ambulatory Visit | Admitting: Cardiology

## 2017-03-22 ENCOUNTER — Encounter (HOSPITAL_COMMUNITY)
Admission: AD | Disposition: A | Discharge status: Home / Self Care | Payer: Self-pay | Source: Other Acute Inpatient Hospital | Attending: Cardiothoracic Surgery

## 2017-03-22 ENCOUNTER — Other Ambulatory Visit: Payer: Self-pay | Admitting: *Deleted

## 2017-03-22 DIAGNOSIS — I2510 Atherosclerotic heart disease of native coronary artery without angina pectoris: Secondary | ICD-10-CM

## 2017-03-22 DIAGNOSIS — I25110 Atherosclerotic heart disease of native coronary artery with unstable angina pectoris: Principal | ICD-10-CM

## 2017-03-22 HISTORY — PX: LEFT HEART CATH AND CORONARY ANGIOGRAPHY: CATH118249

## 2017-03-22 HISTORY — PX: INTRAVASCULAR ULTRASOUND/IVUS: CATH118244

## 2017-03-22 LAB — CBC
HEMATOCRIT: 31.1 % — AB (ref 36.0–46.0)
Hemoglobin: 10.1 g/dL — ABNORMAL LOW (ref 12.0–15.0)
MCH: 29.4 pg (ref 26.0–34.0)
MCHC: 32.5 g/dL (ref 30.0–36.0)
MCV: 90.4 fL (ref 78.0–100.0)
PLATELETS: 270 10*3/uL (ref 150–400)
RBC: 3.44 MIL/uL — ABNORMAL LOW (ref 3.87–5.11)
RDW: 15.2 % (ref 11.5–15.5)
WBC: 5.3 10*3/uL (ref 4.0–10.5)

## 2017-03-22 LAB — HIV ANTIBODY (ROUTINE TESTING W REFLEX): HIV Screen 4th Generation wRfx: NONREACTIVE

## 2017-03-22 LAB — HEPARIN LEVEL (UNFRACTIONATED): HEPARIN UNFRACTIONATED: 0.8 [IU]/mL — AB (ref 0.30–0.70)

## 2017-03-22 LAB — PROTIME-INR
INR: 0.97
Prothrombin Time: 12.8 seconds (ref 11.4–15.2)

## 2017-03-22 LAB — APTT: aPTT: >200 seconds (ref 24–36)

## 2017-03-22 LAB — POCT ACTIVATED CLOTTING TIME: ACTIVATED CLOTTING TIME: 323 s

## 2017-03-22 SURGERY — LEFT HEART CATH AND CORONARY ANGIOGRAPHY
Anesthesia: LOCAL

## 2017-03-22 MED ORDER — HEPARIN SODIUM (PORCINE) 1000 UNIT/ML IJ SOLN
INTRAMUSCULAR | Status: DC | PRN
  Administered 2017-03-22 (×2): 3500 [IU] via INTRAVENOUS

## 2017-03-22 MED ORDER — FENTANYL CITRATE (PF) 100 MCG/2ML IJ SOLN
INTRAMUSCULAR | Status: DC | PRN
  Administered 2017-03-22 (×3): 25 ug via INTRAVENOUS

## 2017-03-22 MED ORDER — SODIUM CHLORIDE 0.9% FLUSH
3.0000 mL | Freq: Two times a day (BID) | INTRAVENOUS | Status: DC
  Administered 2017-03-23 – 2017-03-24 (×3): 3 mL via INTRAVENOUS

## 2017-03-22 MED ORDER — IOPAMIDOL (ISOVUE-370) INJECTION 76%: INTRAVENOUS | Status: DC | PRN

## 2017-03-22 MED ORDER — HEPARIN (PORCINE) IN NACL 2-0.9 UNIT/ML-% IJ SOLN
INTRAMUSCULAR | Status: AC | PRN
  Administered 2017-03-22: 1000 mL via INTRA_ARTERIAL

## 2017-03-22 MED ORDER — SODIUM CHLORIDE 0.9% FLUSH: 3.0000 mL | Freq: Two times a day (BID) | INTRAVENOUS | Status: DC

## 2017-03-22 MED ORDER — IOHEXOL 350 MG/ML SOLN
INTRAVENOUS | Status: DC | PRN
  Administered 2017-03-22: 90 mL via INTRAVENOUS

## 2017-03-22 MED ORDER — HEPARIN (PORCINE) IN NACL 100-0.45 UNIT/ML-% IJ SOLN
950.0000 [IU]/h | INTRAMUSCULAR | Status: DC
  Administered 2017-03-22: 900 [IU]/h via INTRAVENOUS
  Administered 2017-03-23 – 2017-03-25 (×2): 950 [IU]/h via INTRAVENOUS
  Filled 2017-03-22 (×3): qty 250

## 2017-03-22 MED ORDER — ASPIRIN 81 MG PO CHEW
81.0000 mg | CHEWABLE_TABLET | ORAL | Status: AC
  Administered 2017-03-22: 81 mg via ORAL
  Filled 2017-03-22: qty 1

## 2017-03-22 MED ORDER — MIDAZOLAM HCL 2 MG/2ML IJ SOLN
INTRAMUSCULAR | Status: DC | PRN
  Administered 2017-03-22: 2 mg via INTRAVENOUS
  Administered 2017-03-22 (×2): 1 mg via INTRAVENOUS

## 2017-03-22 MED ORDER — INFLUENZA VAC SPLIT QUAD 0.5 ML IM SUSY
0.5000 mL | PREFILLED_SYRINGE | INTRAMUSCULAR | Status: AC
  Administered 2017-03-25: 0.5 mL via INTRAMUSCULAR
  Filled 2017-03-22: qty 0.5

## 2017-03-22 MED ORDER — MIDAZOLAM HCL 2 MG/2ML IJ SOLN
INTRAMUSCULAR | Status: AC
  Filled 2017-03-22: qty 2

## 2017-03-22 MED ORDER — VERAPAMIL HCL 2.5 MG/ML IV SOLN
INTRAVENOUS | Status: AC
  Filled 2017-03-22: qty 2

## 2017-03-22 MED ORDER — LIDOCAINE HCL (PF) 1 % IJ SOLN
INTRAMUSCULAR | Status: DC | PRN
  Administered 2017-03-22: 2 mL

## 2017-03-22 MED ORDER — HEPARIN SODIUM (PORCINE) 1000 UNIT/ML IJ SOLN
INTRAMUSCULAR | Status: AC
  Filled 2017-03-22: qty 1

## 2017-03-22 MED ORDER — PNEUMOCOCCAL VAC POLYVALENT 25 MCG/0.5ML IJ INJ
0.5000 mL | INJECTION | INTRAMUSCULAR | Status: AC
  Administered 2017-03-25: 0.5 mL via INTRAMUSCULAR
  Filled 2017-03-22: qty 0.5

## 2017-03-22 MED ORDER — ALPRAZOLAM 0.5 MG PO TABS
1.0000 mg | ORAL_TABLET | Freq: Two times a day (BID) | ORAL | Status: DC | PRN
  Administered 2017-03-22 – 2017-03-25 (×8): 1 mg via ORAL
  Filled 2017-03-22 (×8): qty 2

## 2017-03-22 MED ORDER — SODIUM CHLORIDE 0.9 % WEIGHT BASED INFUSION: 1.0000 mL/kg/h | INTRAVENOUS | Status: AC

## 2017-03-22 MED ORDER — HEPARIN (PORCINE) IN NACL 2-0.9 UNIT/ML-% IJ SOLN
INTRAMUSCULAR | Status: AC
  Filled 2017-03-22: qty 1000

## 2017-03-22 MED ORDER — SODIUM CHLORIDE 0.9 % WEIGHT BASED INFUSION
1.0000 mL/kg/h | INTRAVENOUS | Status: DC
  Administered 2017-03-22: 1 mL/kg/h via INTRAVENOUS

## 2017-03-22 MED ORDER — SODIUM CHLORIDE 0.9% FLUSH: 3.0000 mL | INTRAVENOUS | Status: DC | PRN

## 2017-03-22 MED ORDER — FENTANYL CITRATE (PF) 100 MCG/2ML IJ SOLN
INTRAMUSCULAR | Status: AC
  Filled 2017-03-22: qty 2

## 2017-03-22 MED ORDER — SODIUM CHLORIDE 0.9 % IV SOLN: 250.0000 mL | INTRAVENOUS | Status: DC | PRN

## 2017-03-22 MED ORDER — MIDAZOLAM HCL 2 MG/2ML IJ SOLN
INTRAMUSCULAR | Status: AC
Start: 2017-03-22 — End: 2017-03-22
  Filled 2017-03-22: qty 2

## 2017-03-22 MED ORDER — VERAPAMIL HCL 2.5 MG/ML IV SOLN
INTRAVENOUS | Status: DC | PRN
  Administered 2017-03-22 (×2): via INTRA_ARTERIAL

## 2017-03-22 MED ORDER — SODIUM CHLORIDE 0.9 % WEIGHT BASED INFUSION
3.0000 mL/kg/h | INTRAVENOUS | Status: DC
  Administered 2017-03-22: 3 mL/kg/h via INTRAVENOUS

## 2017-03-22 SURGICAL SUPPLY — 17 items
CATH 5FR JL3.5 JR4 ANG PIG MP (CATHETERS) ×2 IMPLANT
CATH LAUNCHER 5F EBU3.5 (CATHETERS) ×2 IMPLANT
CATH OPTICROSS 40MHZ (CATHETERS) ×2 IMPLANT
COVER PRB 48X5XTLSCP FOLD TPE (BAG) ×1 IMPLANT
COVER PROBE 5X48 (BAG) ×1
DEVICE RAD TR BAND REGULAR (VASCULAR PRODUCTS) ×2 IMPLANT
GLIDESHEATH SLEND SS 6F .021 (SHEATH) ×2 IMPLANT
GUIDEWIRE INQWIRE 1.5J.035X260 (WIRE) ×1 IMPLANT
INQWIRE 1.5J .035X260CM (WIRE) ×2
KIT ESSENTIALS PG (KITS) ×2 IMPLANT
KIT HEART LEFT (KITS) ×2 IMPLANT
PACK CARDIAC CATHETERIZATION (CUSTOM PROCEDURE TRAY) ×2 IMPLANT
SLED PULL BACK IVUS (MISCELLANEOUS) ×2 IMPLANT
SYR MEDRAD MARK V 150ML (SYRINGE) ×2 IMPLANT
TRANSDUCER W/STOPCOCK (MISCELLANEOUS) ×2 IMPLANT
TUBING CIL FLEX 10 FLL-RA (TUBING) ×2 IMPLANT
WIRE ASAHI PROWATER 180CM (WIRE) ×2 IMPLANT

## 2017-03-22 NOTE — H&P (View-Only) (Signed)
Progress Note  Patient Name: Gabrielle Page Date of Encounter: 03/22/2017  Primary Cardiologist: Peter Martinique MD  Subjective   Anxious- denies any chest pain.  Inpatient Medications    Scheduled Meds: . aspirin EC  81 mg Oral Daily  . [START ON 03/23/2017] Influenza vac split quadrivalent PF  0.5 mL Intramuscular Tomorrow-1000  . metoprolol tartrate  50 mg Oral BID  . montelukast  10 mg Oral QHS  . multivitamin with minerals  1 tablet Oral Daily  . [START ON 03/23/2017] pneumococcal 23 valent vaccine  0.5 mL Intramuscular Tomorrow-1000  . pravastatin  80 mg Oral QPM  . sodium chloride flush  3 mL Intravenous Q12H  . ticagrelor  90 mg Oral BID   Continuous Infusions: . sodium chloride    . sodium chloride 1 mL/kg/hr (03/22/17 0646)  . heparin 800 Units/hr (03/22/17 0625)   PRN Meds: sodium chloride, acetaminophen, nitroGLYCERIN, ondansetron (ZOFRAN) IV, sodium chloride flush   Vital Signs    Vitals:   03/21/17 2107 03/22/17 0627 03/22/17 0630  BP: 116/62 113/72 113/72  Pulse: 71  (!) 59  Resp: 16  18  Temp: (!) 97.5 F (36.4 C)  98.1 F (36.7 C)  TempSrc:   Oral  SpO2: 96%  99%  Weight: 142 lb (64.4 kg)  141 lb 8.6 oz (64.2 kg)  Height: 5\' 3"  (1.6 m)     No intake or output data in the 24 hours ending 03/22/17 0730 Filed Weights   03/21/17 2107 03/22/17 0630  Weight: 142 lb (64.4 kg) 141 lb 8.6 oz (64.2 kg)    Telemetry    NSR - Personally Reviewed  ECG    NSR with LBBB - Personally Reviewed  Physical Exam   GEN: No acute distress.   Neck: No JVD Cardiac: RRR, no murmurs, rubs, or gallops.  Respiratory: Clear to auscultation bilaterally. GI: Soft, nontender, non-distended  MS: No edema; No deformity. Neuro:  Nonfocal  Psych: Normal affect   Labs    Chemistry Recent Labs  Lab 03/21/17 2253  NA 141  K 3.6  CL 105  CO2 23  GLUCOSE 111*  BUN 19  CREATININE 0.62  CALCIUM 9.0  PROT 5.9*  ALBUMIN 3.5  AST 21  ALT 20  ALKPHOS 51    BILITOT 0.3  GFRNONAA >60  GFRAA >60  ANIONGAP 13     Hematology Recent Labs  Lab 03/21/17 2253 03/22/17 0513  WBC 6.4 5.3  RBC 3.31* 3.44*  HGB 9.7* 10.1*  HCT 29.8* 31.1*  MCV 90.0 90.4  MCH 29.3 29.4  MCHC 32.6 32.5  RDW 15.1 15.2  PLT 268 270    Cardiac Enzymes Recent Labs  Lab 03/21/17 2253  TROPONINI <0.03   No results for input(s): TROPIPOC in the last 168 hours.   BNPNo results for input(s): BNP, PROBNP in the last 168 hours.   DDimer No results for input(s): DDIMER in the last 168 hours.   Radiology    No results found.  Cardiac Studies   none  Patient Profile     57 y.o. female s/p stenting of the mid LAD in September 2018 for NSTEMI presents with one week history of progressive angina and markedly positive nuclear stress test.  Assessment & Plan    1. Unstable angina. Myoview study suggests restenosis of the LAD. On DAPT. Will proceed with cardiac cath +/- PCI today. The procedure and risks were reviewed including but not limited to death, myocardial infarction, stroke, arrythmias, bleeding, transfusion,  emergency surgery, dye allergy, or renal dysfunction. The patient voices understanding and is agreeable to proceed. 2. HLD on pravastatin.  For questions or updates, please contact [place] Please consult [email or web address] for contact info under Cardiology/STEMI.      Signed, Peter Martinique, MD  03/22/2017, 7:30 AM

## 2017-03-22 NOTE — Progress Notes (Signed)
[facility] for heparin Indication: chest pain/ACS  Allergies  Allergen Reactions  . Compazine [Prochlorperazine Edisylate] Other (See Comments)    siezure  . Other Other (See Comments)    Steroids cause uncontrollable coughing    Patient Measurements: Height: 5\' 3"  (160 cm) Weight: 142 lb (64.4 kg) IBW/kg (Calculated) : 52.4 Heparin Dosing Weight: 64kg  Vital Signs: Temp: 97.5 F (36.4 C) (02/07 2107) BP: 116/62 (02/07 2107) Pulse Rate: 71 (02/07 2107)  Labs: Recent Labs    03/21/17 2253 03/22/17 0513  HGB 9.7* 10.1*  HCT 29.8* 31.1*  PLT 268 270  APTT >200*  --   LABPROT 12.8  --   INR 0.97  --   HEPARINUNFRC  --  0.80*  CREATININE 0.62  --   TROPONINI <0.03  --     Estimated Creatinine Clearance: 72.6 mL/min (by C-G formula based on SCr of 0.62 mg/dL).   Medical History: Past Medical History:  Diagnosis Date  . Asthma   . Chronic bronchitis ([place])    "usually get it q yr" (11/05/2016)  . GERD (gastroesophageal reflux disease)   . Headache    "weekly" (11/05/2016)  . Heart murmur    "dx'd when I was a child"  . Hepatitis 1991   "when I had my gallbladder out" (11/05/2016)  . History of blood transfusion 1985   S/P childbirth  . History of kidney stones   . NSTEMI (non-ST elevated myocardial infarction) ([place]) 11/05/2016   9/18 PCI/DESx1 to p/mLAD  . Pancreatitis 1991  . Pneumonia    "twice" (11/05/2016)    Assessment: 57 year old female with chest pain. New orders to start IV heparin.  Initial heparin level is supratherapeutic.  CBC wnl.  Goal of Therapy:  Heparin level 0.3-0.7 units/ml Monitor platelets by anticoagulation protocol: Yes   Plan:  -Reduce heparin to 800 units/hr -Daily HL, CBC -Check 6 hr level  Harvel Quale 03/22/2017 6:20 AM

## 2017-03-22 NOTE — Interval H&P Note (Signed)
History and Physical Interval Note:  03/22/2017 7:36 AM  Gabrielle Page  has presented today for surgery, with the diagnosis of cp  The various methods of treatment have been discussed with the patient and family. After consideration of risks, benefits and other options for treatment, the patient has consented to  Procedure(s): LEFT HEART CATH AND CORONARY ANGIOGRAPHY (N/A) as a surgical intervention .  The patient's history has been reviewed, patient examined, no change in status, stable for surgery.  I have reviewed the patient's chart and labs.  Questions were answered to the patient's satisfaction.   Cath Lab Visit (complete for each Cath Lab visit)  Clinical Evaluation Leading to the Procedure:   ACS: Yes.    Non-ACS:    Anginal Classification: CCS IV  Anti-ischemic medical therapy: Minimal Therapy (1 class of medications)  Non-Invasive Test Results: High-risk stress test findings: cardiac mortality >3%/year  Prior CABG: No previous CABG        Gabrielle Page [facility] 03/22/2017 7:36 AM

## 2017-03-22 NOTE — Progress Notes (Addendum)
CRITICAL VALUE STICKER  CRITICAL VALUE: PTT >200  RECEIVER (on-site recipient of call): Mellissa Kohut, RN  DATE & TIME NOTIFIED: 00:20 03/22/2017  MD NOTIFIED: Dr.  Lions   TIME OF NOTIFICATION: 00:20 03/22/2017  RESPONSE: Pharmacy said to disregard lab as it is an erroneous value due to being drawn after heparin bolus.

## 2017-03-22 NOTE — Progress Notes (Signed)
Gabrielle Page for heparin Indication: chest pain/ACS  Allergies  Allergen Reactions  . Compazine [Prochlorperazine Edisylate] Other (See Comments)    siezure  . Other Other (See Comments)    Steroids cause uncontrollable coughing    Patient Measurements: Height: 5\' 3"  (160 cm) Weight: 141 lb 8.6 oz (64.2 kg) IBW/kg (Calculated) : 52.4 Heparin Dosing Weight: 64kg  Vital Signs: Temp: 98.1 F (36.7 C) (02/08 0630) Temp Source: Oral (02/08 0630) BP: 152/95 (02/08 0921) Pulse Rate: 65 (02/08 0921)  Labs: Recent Labs    03/21/17 2253 03/22/17 0513  HGB 9.7* 10.1*  HCT 29.8* 31.1*  PLT 268 270  APTT >200*  --   LABPROT 12.8  --   INR 0.97  --   HEPARINUNFRC  --  0.80*  CREATININE 0.62  --   TROPONINI <0.03  --     Estimated Creatinine Clearance: 72.5 mL/min (by C-G formula based on SCr of 0.62 mg/dL).   Medical History: Past Medical History:  Diagnosis Date  . Asthma   . Chronic bronchitis ([place])    "usually get it q yr" (11/05/2016)  . GERD (gastroesophageal reflux disease)   . Headache    "weekly" (11/05/2016)  . Heart murmur    "dx'd when I was a child"  . Hepatitis 1991   "when I had my gallbladder out" (11/05/2016)  . History of blood transfusion 1985   S/P childbirth  . History of kidney stones   . NSTEMI (non-ST elevated myocardial infarction) ([place]) 11/05/2016   9/18 PCI/DESx1 to p/mLAD  . Pancreatitis 1991  . Pneumonia    "twice" (11/05/2016)    Assessment: 57 year old female s/p cath with plans for CABG To resume heparin  Goal of Therapy:  Heparin level 0.3-0.7 units/ml Monitor platelets by anticoagulation protocol: Yes   Plan:  To resume heparin tonight  Heparin at 900 units Daily HL, CBC  Thank you Gabrielle Page, PharmD [phone]  03/22/2017 10:18 AM

## 2017-03-22 NOTE — Progress Notes (Signed)
Pt denied cp throughout shift. VS stable. Will continue to monitor.

## 2017-03-22 NOTE — Progress Notes (Signed)
Progress Note  Patient Name: Gabrielle Page Date of Encounter: 03/22/2017  Primary Cardiologist: Lexa Coronado Martinique MD  Subjective   Anxious- denies any chest pain.  Inpatient Medications    Scheduled Meds: . aspirin EC  81 mg Oral Daily  . [START ON 03/23/2017] Influenza vac split quadrivalent PF  0.5 mL Intramuscular Tomorrow-1000  . metoprolol tartrate  50 mg Oral BID  . montelukast  10 mg Oral QHS  . multivitamin with minerals  1 tablet Oral Daily  . [START ON 03/23/2017] pneumococcal 23 valent vaccine  0.5 mL Intramuscular Tomorrow-1000  . pravastatin  80 mg Oral QPM  . sodium chloride flush  3 mL Intravenous Q12H  . ticagrelor  90 mg Oral BID   Continuous Infusions: . sodium chloride    . sodium chloride 1 mL/kg/hr (03/22/17 0646)  . heparin 800 Units/hr (03/22/17 0625)   PRN Meds: sodium chloride, acetaminophen, nitroGLYCERIN, ondansetron (ZOFRAN) IV, sodium chloride flush   Vital Signs    Vitals:   03/21/17 2107 03/22/17 0627 03/22/17 0630  BP: 116/62 113/72 113/72  Pulse: 71  (!) 59  Resp: 16  18  Temp: (!) 97.5 F (36.4 C)  98.1 F (36.7 C)  TempSrc:   Oral  SpO2: 96%  99%  Weight: 142 lb (64.4 kg)  141 lb 8.6 oz (64.2 kg)  Height: 5\' 3"  (1.6 m)     No intake or output data in the 24 hours ending 03/22/17 0730 Filed Weights   03/21/17 2107 03/22/17 0630  Weight: 142 lb (64.4 kg) 141 lb 8.6 oz (64.2 kg)    Telemetry    NSR - Personally Reviewed  ECG    NSR with LBBB - Personally Reviewed  Physical Exam   GEN: No acute distress.   Neck: No JVD Cardiac: RRR, no murmurs, rubs, or gallops.  Respiratory: Clear to auscultation bilaterally. GI: Soft, nontender, non-distended  MS: No edema; No deformity. Neuro:  Nonfocal  Psych: Normal affect   Labs    Chemistry Recent Labs  Lab 03/21/17 2253  NA 141  K 3.6  CL 105  CO2 23  GLUCOSE 111*  BUN 19  CREATININE 0.62  CALCIUM 9.0  PROT 5.9*  ALBUMIN 3.5  AST 21  ALT 20  ALKPHOS 51    BILITOT 0.3  GFRNONAA >60  GFRAA >60  ANIONGAP 13     Hematology Recent Labs  Lab 03/21/17 2253 03/22/17 0513  WBC 6.4 5.3  RBC 3.31* 3.44*  HGB 9.7* 10.1*  HCT 29.8* 31.1*  MCV 90.0 90.4  MCH 29.3 29.4  MCHC 32.6 32.5  RDW 15.1 15.2  PLT 268 270    Cardiac Enzymes Recent Labs  Lab 03/21/17 2253  TROPONINI <0.03   No results for input(s): TROPIPOC in the last 168 hours.   BNPNo results for input(s): BNP, PROBNP in the last 168 hours.   DDimer No results for input(s): DDIMER in the last 168 hours.   Radiology    No results found.  Cardiac Studies   none  Patient Profile     57 y.o. female s/p stenting of the mid LAD in September 2018 for NSTEMI presents with one week history of progressive angina and markedly positive nuclear stress test.  Assessment & Plan    1. Unstable angina. Myoview study suggests restenosis of the LAD. On DAPT. Will proceed with cardiac cath +/- PCI today. The procedure and risks were reviewed including but not limited to death, myocardial infarction, stroke, arrythmias, bleeding, transfusion,  emergency surgery, dye allergy, or renal dysfunction. The patient voices understanding and is agreeable to proceed. 2. HLD on pravastatin.  For questions or updates, please contact [place] Please consult [email or web address] for contact info under Cardiology/STEMI.      Signed, Noreen Mackintosh Martinique, MD  03/22/2017, 7:30 AM

## 2017-03-22 NOTE — Progress Notes (Signed)
Day of Surgery Procedure(s) (LRB): LEFT HEART CATH AND CORONARY ANGIOGRAPHY (N/A) Intravascular Ultrasound/IVUS (N/A) Subjective: Patient examined, coronary angiograms reviewed Full consult to follow Agree with recommendation  for CABG Surgery sched tues 2-12 after Brillinta washout Objective: Vital signs in last 24 hours: Temp:  [97.5 F (36.4 C)-98.1 F (36.7 C)] 97.9 F (36.6 C) (02/08 1723) Pulse Rate:  [0-76] 76 (02/08 1723) Cardiac Rhythm: Normal sinus rhythm (02/08 1600) Resp:  [0-22] 0 (02/08 0855) BP: (95-170)/(52-97) 131/94 (02/08 1723) SpO2:  [0 %-100 %] 100 % (02/08 1723) Weight:  [141 lb 8.6 oz (64.2 kg)-142 lb (64.4 kg)] 141 lb 8.6 oz (64.2 kg) (02/08 0630)  Hemodynamic parameters for last 24 hours:    Intake/Output from previous day: No intake/output data recorded. Intake/Output this shift: No intake/output data recorded.    Lab Results: Recent Labs    03/21/17 2253 03/22/17 0513  WBC 6.4 5.3  HGB 9.7* 10.1*  HCT 29.8* 31.1*  PLT 268 270   BMET:  Recent Labs    03/21/17 2253  NA 141  K 3.6  CL 105  CO2 23  GLUCOSE 111*  BUN 19  CREATININE 0.62  CALCIUM 9.0    PT/INR:  Recent Labs    03/21/17 2253  LABPROT 12.8  INR 0.97   ABG No results found for: PHART, HCO3, TCO2, ACIDBASEDEF, O2SAT CBG (last 3)  No results for input(s): GLUCAP in the last 72 hours.  Assessment/Plan: S/P Procedure(s) (LRB): LEFT HEART CATH AND CORONARY ANGIOGRAPHY (N/A) Intravascular Ultrasound/IVUS (N/A) CABG tues am    LOS: 1 day    Gabrielle Page 03/22/2017

## 2017-03-23 ENCOUNTER — Other Ambulatory Visit (HOSPITAL_COMMUNITY): Payer: Medicaid Other

## 2017-03-23 ENCOUNTER — Inpatient Hospital Stay (HOSPITAL_COMMUNITY): Payer: Self-pay

## 2017-03-23 DIAGNOSIS — R945 Abnormal results of liver function studies: Secondary | ICD-10-CM

## 2017-03-23 DIAGNOSIS — R7989 Other specified abnormal findings of blood chemistry: Secondary | ICD-10-CM | POA: Diagnosis not present

## 2017-03-23 DIAGNOSIS — I200 Unstable angina: Secondary | ICD-10-CM

## 2017-03-23 DIAGNOSIS — E876 Hypokalemia: Secondary | ICD-10-CM

## 2017-03-23 LAB — HEPARIN LEVEL (UNFRACTIONATED): HEPARIN UNFRACTIONATED: 0.31 [IU]/mL (ref 0.30–0.70)

## 2017-03-23 LAB — CBC
HEMATOCRIT: 31.2 % — AB (ref 36.0–46.0)
HEMOGLOBIN: 10.2 g/dL — AB (ref 12.0–15.0)
MCH: 29.5 pg (ref 26.0–34.0)
MCHC: 32.7 g/dL (ref 30.0–36.0)
MCV: 90.2 fL (ref 78.0–100.0)
Platelets: 270 10*3/uL (ref 150–400)
RBC: 3.46 MIL/uL — ABNORMAL LOW (ref 3.87–5.11)
RDW: 15 % (ref 11.5–15.5)
WBC: 3.9 10*3/uL — ABNORMAL LOW (ref 4.0–10.5)

## 2017-03-23 LAB — COMPREHENSIVE METABOLIC PANEL
ALT: 519 U/L — ABNORMAL HIGH (ref 14–54)
AST: 443 U/L — ABNORMAL HIGH (ref 15–41)
Albumin: 3.5 g/dL (ref 3.5–5.0)
Alkaline Phosphatase: 118 U/L (ref 38–126)
Anion gap: 11 (ref 5–15)
BUN: 14 mg/dL (ref 6–20)
CO2: 24 mmol/L (ref 22–32)
Calcium: 9.2 mg/dL (ref 8.9–10.3)
Chloride: 105 mmol/L (ref 101–111)
Creatinine, Ser: 0.54 mg/dL (ref 0.44–1.00)
GFR calc Af Amer: >60 mL/min (ref >60)
GFR calc non Af Amer: >60 mL/min (ref >60)
Glucose, Bld: 102 mg/dL — ABNORMAL HIGH (ref 65–99)
Potassium: 3.3 mmol/L — ABNORMAL LOW (ref 3.5–5.1)
Sodium: 140 mmol/L (ref 135–145)
Total Bilirubin: 0.6 mg/dL (ref 0.3–1.2)
Total Protein: 6.3 g/dL — ABNORMAL LOW (ref 6.5–8.1)

## 2017-03-23 LAB — PLATELET INHIBITION P2Y12: Platelet Function  P2Y12: 285 [PRU] (ref 194–418)

## 2017-03-23 LAB — PROTIME-INR
INR: 0.93
Prothrombin Time: 12.4 seconds (ref 11.4–15.2)

## 2017-03-23 LAB — HEMOGLOBIN A1C
Hgb A1c MFr Bld: 5.6 % (ref 4.8–5.6)
Hgb A1c MFr Bld: 5.7 % — ABNORMAL HIGH (ref 4.8–5.6)
Mean Plasma Glucose: 114.02 mg/dL
Mean Plasma Glucose: 116.89 mg/dL

## 2017-03-23 LAB — BASIC METABOLIC PANEL
Anion gap: 12 (ref 5–15)
BUN: 15 mg/dL (ref 6–20)
Calcium: 9.3 mg/dL (ref 8.9–10.3)
Creatinine, Ser: 0.55 mg/dL (ref 0.44–1.00)
GFR calc Af Amer: >60 mL/min (ref >60)
GFR calc non Af Amer: >60 mL/min (ref >60)
Sodium: 139 mmol/L (ref 135–145)

## 2017-03-23 LAB — BASIC METABOLIC PANEL WITH GFR
CO2: 25 mmol/L (ref 22–32)
Chloride: 102 mmol/L (ref 101–111)
Glucose, Bld: 126 mg/dL — ABNORMAL HIGH (ref 65–99)
Potassium: 3.8 mmol/L (ref 3.5–5.1)

## 2017-03-23 LAB — HEPATIC FUNCTION PANEL
ALT: 512 U/L — ABNORMAL HIGH (ref 14–54)
AST: 351 U/L — ABNORMAL HIGH (ref 15–41)
Albumin: 3.5 g/dL (ref 3.5–5.0)
Alkaline Phosphatase: 138 U/L — ABNORMAL HIGH (ref 38–126)
Bilirubin, Direct: 0.1 mg/dL (ref 0.1–0.5)
Indirect Bilirubin: 0.4 mg/dL (ref 0.3–0.9)
Total Bilirubin: 0.5 mg/dL (ref 0.3–1.2)
Total Protein: 6.3 g/dL — ABNORMAL LOW (ref 6.5–8.1)

## 2017-03-23 LAB — TSH: TSH: 1.353 u[IU]/mL (ref 0.350–4.500)

## 2017-03-23 LAB — ACETAMINOPHEN LEVEL: Acetaminophen (Tylenol), Serum: <10 ug/mL — ABNORMAL LOW (ref 10–30)

## 2017-03-23 MED ORDER — POTASSIUM CHLORIDE CRYS ER 20 MEQ PO TBCR
20.0000 meq | EXTENDED_RELEASE_TABLET | Freq: Two times a day (BID) | ORAL | Status: AC
  Administered 2017-03-23 – 2017-03-25 (×6): 20 meq via ORAL
  Filled 2017-03-23 (×6): qty 1

## 2017-03-23 NOTE — Progress Notes (Signed)
ANTICOAGULATION CONSULT NOTE - Follow Up Consult  Pharmacy Consult for heparin Indication: chest pain/ACS - CABG Tues 2/12  Allergies  Allergen Reactions  . Compazine [Prochlorperazine Edisylate] Other (See Comments)    siezure  . Other Other (See Comments)    Steroids cause uncontrollable coughing    Patient Measurements: Height: 5\' 3"  (160 cm) Weight: 142 lb 3.2 oz (64.5 kg) IBW/kg (Calculated) : 52.4 Heparin Dosing Weight: 64.4 kg  Vital Signs: Temp: 97.6 F (36.4 C) (02/09 0543) Temp Source: Oral (02/09 0543) BP: 142/84 (02/09 0543) Pulse Rate: 72 (02/09 0543)  Labs: Recent Labs    03/21/17 2253 03/22/17 0513 03/23/17 0653  HGB 9.7* 10.1* 10.2*  HCT 29.8* 31.1* 31.2*  PLT 268 270 270  APTT >200*  --   --   LABPROT 12.8  --  12.4  INR 0.97  --  0.93  HEPARINUNFRC  --  0.80* 0.31  CREATININE 0.62  --  0.54  TROPONINI <0.03  --   --     Estimated Creatinine Clearance: 72.6 mL/min (by C-G formula based on SCr of 0.54 mg/dL).   Medications:  Scheduled:  . aspirin EC  81 mg Oral Daily  . Influenza vac split quadrivalent PF  0.5 mL Intramuscular Tomorrow-1000  . metoprolol tartrate  50 mg Oral BID  . montelukast  10 mg Oral QHS  . multivitamin with minerals  1 tablet Oral Daily  . pneumococcal 23 valent vaccine  0.5 mL Intramuscular Tomorrow-1000  . pravastatin  80 mg Oral QPM  . sodium chloride flush  3 mL Intravenous Q12H   Infusions:  . sodium chloride    . heparin 900 Units/hr (03/22/17 2116)    Assessment: 57 y/o female with hx of STEMI (DES 10/2016) on heparin s/p cath revealing progression of stenosis of LAD. Heparin was restarted last night post-cath, heparin level now 0.31, at low end of goal range. Of note Brilinta is currently on hold in preparation for CABG next week. CBC is stable, no bleeding noted.   Goal of Therapy:  Heparin level 0.3-0.7 units/ml Monitor platelets by anticoagulation protocol: Yes   Plan:  Increase heparin to 950  units/hr Daily heparin level, CBC Monitor s/sx of bleeding CABG planned for Tues 2/12 - after Brilinta washout   Charlene Brooke, PharmD PGY1 Pharmacy Resident Phone: [phone] After 3:30PM please call Main Pharmacy [phone] 03/23/2017,9:40 AM

## 2017-03-23 NOTE — Consult Note (Signed)
Medical Consultation   Gabrielle Page  NOB:[id number]  DOB: 1960/03/13  DOA: 03/21/2017  PCP: [name], NP   Outpatient Specialists: Cardiology  Requesting physician: Dr. Acie Fredrickson cardiology  Reason for consultation: Elevated LFTs   History of Present Illness: Gabrielle Page is an 57 y.o. female with a past medical history relevant for coronary artery disease status post STEMI in September 2018, and admission to [facility] with angina and transferred to [facility].  Radial cardiac catheterization on 03/22/2017 showed ostial LAD stenosis of 80%, distal 40% left main stenosis, 85 diagonal stenosis.  She is pending waiting for ticagrelor to washout to have a CABG done.  Today on routine labs her comprehensive metabolic panel showed an AST of 443, and ALT of 519.  Her alkaline phosphatase and total bilirubin were normal.  Her INR was 0.93.  On 03/21/2017 at [facility] her liver function tests were completely normal. Patient reports that since coming into [facility] she has had some epigastric pain as well as some right upper quadrant pain.  She has had a decreased appetite.  However eating does not seem to exacerbate this pain.  She has had some nausea but no emesis.  She has had constipation but no diarrhea.  She is no history of liver disease.  She reports only occasional alcohol use.  The last time she received Tylenol was yesterday at [facility].  She does take it sometimes at home for headache.  She reports abdominal pain does not radiate.  However it can occasionally take her breath away.  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  She required PTC tubes placed which she mistakenly ripped out, and then had to have "her bile duct remade".  She denies any nausea vomiting, diarrhea, current chest pain, current shortness of breath, lower extremity edema, easy bruising or bleeding,  confusion, rashes, hematochezia, hematemesis.  She denies any IV drug use.  She reports a blood transfusion approximately 1985 after her daughter was born because of postpartum hemorrhage.     Review of Systems:  ROS As per HPI otherwise 10 point review of systems negative.     Past Medical History: Past Medical History:  Diagnosis Date  . Asthma   . Chronic bronchitis ([place])    "usually get it q yr" (11/05/2016)  . GERD (gastroesophageal reflux disease)   . Headache    "weekly" (11/05/2016)  . Heart murmur    "dx'd when I was a child"  . Hepatitis 1991   "when I had my gallbladder out" (11/05/2016)  . History of blood transfusion 1985   S/P childbirth  . History of kidney stones   . NSTEMI (non-ST elevated myocardial infarction) ([place]) 11/05/2016   9/18 PCI/DESx1 to p/mLAD  . Pancreatitis 1991  . Pneumonia    "twice" (11/05/2016)    Past Surgical History: Past Surgical History:  Procedure Laterality Date  . ABDOMINAL HYSTERECTOMY  1990  . APPENDECTOMY  1992  . BILE DUCT EXPLORATION  1991   "reconstruction"  . CESAREAN SECTION  1985  . CHOLECYSTECTOMY OPEN  1991  . CORONARY ANGIOPLASTY WITH STENT PLACEMENT  11/05/2016  . CORONARY STENT INTERVENTION N/A 11/05/2016   Procedure: CORONARY STENT INTERVENTION;  Surgeon: Martinique, Peter M, MD;  Location: [facility];  Service: Cardiovascular;  Laterality: N/A;  . CYSTOSCOPY WITH STENT PLACEMENT  "3 times" (11/05/2016)  . DILATION AND  CURETTAGE OF UTERUS  1989  . KNEE ARTHROSCOPY Right 2000s  . LEFT HEART CATH AND CORONARY ANGIOGRAPHY N/A 11/05/2016   Procedure: LEFT HEART CATH AND CORONARY ANGIOGRAPHY;  Surgeon: Martinique, Peter M, MD;  Location: [facility];  Service: Cardiovascular;  Laterality: N/A;  . NASAL SINUS SURGERY  ~ 2009   "exposed to mildew; had to have the infectoin drained; cut the outside of my face"  . OVARIAN CYST SURGERY  X 6     Allergies:   Allergies  Allergen Reactions  . Compazine  [Prochlorperazine Edisylate] Other (See Comments)    siezure  . Other Other (See Comments)    Steroids cause uncontrollable coughing     Social History:  reports that  has never smoked. she has never used smokeless tobacco. She reports that she drinks alcohol. She reports that she does not use drugs.   Family History: Family History  Problem Relation Age of Onset  . Hypertension Father   . Alcohol abuse Father   . Heart attack Father   . Diabetes Mellitus II Father   . Suicidality Mother     Unacceptable: Noncontributory, unremarkable, or negative. Acceptable: Family history reviewed and not pertinent (If you reviewed it)   Physical Exam: Vitals:   03/22/17 2059 03/23/17 0023 03/23/17 0543 03/23/17 1349  BP: (!) 152/77 138/84 (!) 142/84 135/74  Pulse: 71 74 72 73  Resp:  19    Temp: 98.1 F (36.7 C) 98.2 F (36.8 C) 97.6 F (36.4 C) 97.8 F (36.6 C)  TempSrc: Oral Oral Oral Oral  SpO2: 99% 99% 100% 100%  Weight:   64.5 kg (142 lb 3.2 oz)   Height:        Constitutional: No acute distress, anxious Eyes: Anicteric sclera,  ENMT: Moist mucous membranes, Neck: Neck midline, no lymphadenopathy CVS: Regular rate and rhythm, normal S1-S2, 2 out of 6 systolic murmur heard best at right upper sternal border Respiratory:  clear to auscultation bilaterally, no wheezing, rales or rhonchi. Respiratory effort normal. No accessory muscle use.  Abdomen: Soft, right upper quadrant tenderness to deep palpation, epigastric tenderness to deep palpation, no rebound, normal bowel sounds Musculoskeletal: : No edema, no contractures Neuro: Mostly intact, moving all extremities Psych: judgement and insight appear normal, stable mood and affect, quite anxious Skin: Well-healed incisions over right upper quadrant    Data reviewed:  I have personally reviewed following labs and imaging studies Labs:  CBC: Recent Labs  Lab 03/21/17 2253 03/22/17 0513 03/23/17 0653  WBC 6.4 5.3 3.9*   NEUTROABS 4.1  --   --   HGB 9.7* 10.1* 10.2*  HCT 29.8* 31.1* 31.2*  MCV 90.0 90.4 90.2  PLT 268 270 678    Basic Metabolic Panel: Recent Labs  Lab 03/21/17 2253 03/23/17 0653  NA 141 140  K 3.6 3.3*  CL 105 105  CO2 23 24  GLUCOSE 111* 102*  BUN 19 14  CREATININE 0.62 0.54  CALCIUM 9.0 9.2   GFR Estimated Creatinine Clearance: 72.6 mL/min (by C-G formula based on SCr of 0.54 mg/dL). Liver Function Tests: Recent Labs  Lab 03/21/17 2253 03/23/17 0653  AST 21 443*  ALT 20 519*  ALKPHOS 51 118  BILITOT 0.3 0.6  PROT 5.9* 6.3*  ALBUMIN 3.5 3.5   No results for input(s): LIPASE, AMYLASE in the last 168 hours. No results for input(s): AMMONIA in the last 168 hours. Coagulation profile Recent Labs  Lab 03/21/17 2253 03/23/17 0653  INR 0.97  0.93    Cardiac Enzymes: Recent Labs  Lab 03/21/17 2253  TROPONINI <0.03   BNP: Invalid input(s): POCBNP CBG: No results for input(s): GLUCAP in the last 168 hours. D-Dimer No results for input(s): DDIMER in the last 72 hours. Hgb A1c Recent Labs    03/23/17 0653  HGBA1C 5.6   Lipid Profile No results for input(s): CHOL, HDL, LDLCALC, TRIG, CHOLHDL, LDLDIRECT in the last 72 hours. Thyroid function studies Recent Labs    03/23/17 0653  TSH 1.353   Anemia work up No results for input(s): VITAMINB12, FOLATE, FERRITIN, TIBC, IRON, RETICCTPCT in the last 72 hours. Urinalysis No results found for: COLORURINE, APPEARANCEUR, LABSPEC, [place], GLUCOSEU, HGBUR, BILIRUBINUR, KETONESUR, PROTEINUR, UROBILINOGEN, NITRITE, LEUKOCYTESUR   Sepsis Labs Invalid input(s): PROCALCITONIN,  WBC,  LACTICIDVEN Microbiology No results found for this or any previous visit (from the past 240 hour(s)).     Inpatient Medications:   Scheduled Meds: . aspirin EC  81 mg Oral Daily  . Influenza vac split quadrivalent PF  0.5 mL Intramuscular Tomorrow-1000  . metoprolol tartrate  50 mg Oral BID  . montelukast  10 mg Oral QHS  .  multivitamin with minerals  1 tablet Oral Daily  . pneumococcal 23 valent vaccine  0.5 mL Intramuscular Tomorrow-1000  . potassium chloride  20 mEq Oral BID  . sodium chloride flush  3 mL Intravenous Q12H   Continuous Infusions: . sodium chloride    . heparin 950 Units/hr (03/23/17 1113)     Radiological Exams on Admission: Dg Chest 2 View  Result Date: 03/23/2017 CLINICAL DATA:  Chest pain.  Preop for heart surgery EXAM: CHEST  2 VIEW COMPARISON:  None. FINDINGS: Normal heart size and mediastinal contours. Coronary stent noted. No acute infiltrate or edema. No effusion or pneumothorax. No acute osseous findings. IMPRESSION: Negative chest. Electronically Signed   By: Monte Fantasia M.D.   On: 03/23/2017 09:33    Impression/Recommendations Active Problems:   Unstable angina (HCC)   Elevated LFTs  ##) Elevated LFTs: Her LFT elevation so soon after a normal value approximately 2 days ago is concerning for a fairly acute event.  She has good synthetic function and that her INR is normal.  However she does have right upper quadrant pain and a history of surgical procedures.  She has been exposed to some new agents including contrast however none of her medications listed other than montelukast which has been associated with some hepatotoxicity in case reports.  While statins and of themselves almost never do cause elevated LFTs it would be prudent to hold statins for now. -Repeat hepatic panel -Hold pravastatin, hold montelukast - Abdominal ultrasound with Dopplers -Follow-up acute hepatitis panel, EBV, CMV -Would hold on semi-elective surgery at this time until her LFTs can be sorted out - Will consider GI consult pending studies  ##) Mild intermittent asthma: Triggers apparently appear to be allergies -Hold montelukast  ##) Chest pain: Defer further cardiac workup to cardiology   Thank you for this consultation.  Our [facility] hospitalist team will follow the patient with you.   Time  Spent: 30  Cristy Folks M.D. [facility] 03/23/2017, 3:17 PM

## 2017-03-23 NOTE — Progress Notes (Signed)
Progress Note  Patient Name: Gabrielle Page Date of Encounter: 03/23/2017  Primary Cardiologist: Martinique   Subjective   57 year old female with a history of coronary artery disease-status post ST segment elevation myocardial infarction in September.. She was admitted on February 7 with crescendo angina.  She was found to have a markedly positive Myoview study with reversible anterior and anterolateral/anteroseptal defect.  She was started on heparin drip and was transferred to [facility].  Heart catheterization on February 8 revealed ostial LAD stenosis of 80%.  Mid LAD stent is widely patent.  There is a 40% distal left main stenosis.  First diagonal artery has an 85% stenosis.  She has been seen by surgery and the plan is to perform coronary artery bypass grafting once her Brilinta washes out.  Inpatient Medications    Scheduled Meds: . aspirin EC  81 mg Oral Daily  . Influenza vac split quadrivalent PF  0.5 mL Intramuscular Tomorrow-1000  . metoprolol tartrate  50 mg Oral BID  . montelukast  10 mg Oral QHS  . multivitamin with minerals  1 tablet Oral Daily  . pneumococcal 23 valent vaccine  0.5 mL Intramuscular Tomorrow-1000  . pravastatin  80 mg Oral QPM  . sodium chloride flush  3 mL Intravenous Q12H   Continuous Infusions: . sodium chloride    . heparin 900 Units/hr (03/22/17 2116)   PRN Meds: sodium chloride, acetaminophen, ALPRAZolam, nitroGLYCERIN, ondansetron (ZOFRAN) IV, sodium chloride flush   Vital Signs    Vitals:   03/22/17 1723 03/22/17 2059 03/23/17 0023 03/23/17 0543  BP: (!) 131/94 (!) 152/77 138/84 (!) 142/84  Pulse: 76 71 74 72  Resp:   19   Temp: 97.9 F (36.6 C) 98.1 F (36.7 C) 98.2 F (36.8 C) 97.6 F (36.4 C)  TempSrc: Oral Oral Oral Oral  SpO2: 100% 99% 99% 100%  Weight:    142 lb 3.2 oz (64.5 kg)  Height:        Intake/Output Summary (Last 24 hours) at 03/23/2017 1029 Last data filed at 03/23/2017 0500 Gross per 24 hour  Intake  531.6 ml  Output 200 ml  Net 331.6 ml   Filed Weights   03/21/17 2107 03/22/17 0630 03/23/17 0543  Weight: 142 lb (64.4 kg) 141 lb 8.6 oz (64.2 kg) 142 lb 3.2 oz (64.5 kg)    Telemetry    NSR 68  - Personally Reviewed  ECG     NSR. LBBB   - Personally Reviewed  Physical Exam   GEN:  middle age, female,  Neck: No JVD Cardiac: RRR,  Soft systolic murmur  Respiratory: Clear to auscultation bilaterally. GI: Soft, nontender, non-distended  MS: No edema; No deformity. ,  Right radial cath site looks good.  Good pulses. Neuro:  Nonfocal  Psych: Normal affect   Labs    Chemistry Recent Labs  Lab 03/21/17 2253 03/23/17 0653  NA 141 140  K 3.6 3.3*  CL 105 105  CO2 23 24  GLUCOSE 111* 102*  BUN 19 14  CREATININE 0.62 0.54  CALCIUM 9.0 9.2  PROT 5.9* 6.3*  ALBUMIN 3.5 3.5  AST 21 443*  ALT 20 519*  ALKPHOS 51 118  BILITOT 0.3 0.6  GFRNONAA >60 >60  GFRAA >60 >60  ANIONGAP 13 11     Hematology Recent Labs  Lab 03/21/17 2253 03/22/17 0513 03/23/17 0653  WBC 6.4 5.3 3.9*  RBC 3.31* 3.44* 3.46*  HGB 9.7* 10.1* 10.2*  HCT 29.8* 31.1* 31.2*  MCV  90.0 90.4 90.2  MCH 29.3 29.4 29.5  MCHC 32.6 32.5 32.7  RDW 15.1 15.2 15.0  PLT 268 270 270    Cardiac Enzymes Recent Labs  Lab 03/21/17 2253  TROPONINI <0.03   No results for input(s): TROPIPOC in the last 168 hours.   BNPNo results for input(s): BNP, PROBNP in the last 168 hours.   DDimer No results for input(s): DDIMER in the last 168 hours.   Radiology    Dg Chest 2 View  Result Date: 03/23/2017 CLINICAL DATA:  Chest pain.  Preop for heart surgery EXAM: CHEST  2 VIEW COMPARISON:  None. FINDINGS: Normal heart size and mediastinal contours. Coronary stent noted. No acute infiltrate or edema. No effusion or pneumothorax. No acute osseous findings. IMPRESSION: Negative chest. Electronically Signed   By: Monte Fantasia M.D.   On: 03/23/2017 09:33    Cardiac Studies     Patient Profile     57 y.o.  female with a history of coronary artery disease presents with crescendo angina and was found to have a tight left main/LAD stenosis.  Assessment & Plan    1.  Coronary artery disease: Patient has a history of stenting of the mid LAD.  She presented to [facility] with crescendo angina.  A stress Myoview study revealed a large anterior/anteroseptal defect.  She was transferred to [facility]. Cardiac catheterization reveals a left main/LAD stenosis.  The lesion is not favorable for Angie plasty.  She is being referred for coronary artery bypass grafting.  At present she appears to be very comfortable.  She does have some intermittent chest pain when she gets up to go to the bathroom.  She is on heparin drip.  2.  Hypokalemia: Her potassium level was 3.2.  Will supplement her today.  She is currently not on a diuretic.  For questions or updates, please contact [place] Please consult [email or web address] for contact info under Cardiology/STEMI.      Signed, Mertie Moores, MD  03/23/2017, 10:29 AM

## 2017-03-23 NOTE — Plan of Care (Signed)
Post cath right radial site level 0. Pt ambulating to BR without difficulty. Heparin restarted per post cath orders. Will continue to monitor. Jessie Foot, RN

## 2017-03-24 ENCOUNTER — Inpatient Hospital Stay (HOSPITAL_COMMUNITY): Payer: Self-pay

## 2017-03-24 DIAGNOSIS — I2510 Atherosclerotic heart disease of native coronary artery without angina pectoris: Secondary | ICD-10-CM

## 2017-03-24 DIAGNOSIS — I25110 Atherosclerotic heart disease of native coronary artery with unstable angina pectoris: Secondary | ICD-10-CM

## 2017-03-24 DIAGNOSIS — R945 Abnormal results of liver function studies: Secondary | ICD-10-CM

## 2017-03-24 DIAGNOSIS — Z01810 Encounter for preprocedural cardiovascular examination: Secondary | ICD-10-CM

## 2017-03-24 DIAGNOSIS — I340 Nonrheumatic mitral (valve) insufficiency: Secondary | ICD-10-CM

## 2017-03-24 LAB — ECHOCARDIOGRAM COMPLETE
Height: 63 in
Weight: 2305.6 oz

## 2017-03-24 LAB — PROTIME-INR
INR: 0.91
Prothrombin Time: 12.2 seconds (ref 11.4–15.2)

## 2017-03-24 LAB — BASIC METABOLIC PANEL
ANION GAP: 9 (ref 5–15)
BUN: 12 mg/dL (ref 6–20)
CHLORIDE: 104 mmol/L (ref 101–111)
CO2: 25 mmol/L (ref 22–32)
Calcium: 8.9 mg/dL (ref 8.9–10.3)
Creatinine, Ser: 0.47 mg/dL (ref 0.44–1.00)
GFR calc Af Amer: >60 mL/min (ref >60)
Glucose, Bld: 110 mg/dL — ABNORMAL HIGH (ref 65–99)
POTASSIUM: 3.7 mmol/L (ref 3.5–5.1)
SODIUM: 138 mmol/L (ref 135–145)

## 2017-03-24 LAB — EPSTEIN-BARR VIRUS VCA, IGG: EBV VCA IgG: 80.7 U/mL — ABNORMAL HIGH (ref 0.0–17.9)

## 2017-03-24 LAB — TROPONIN I
Troponin I: <0.03 ng/mL (ref <0.03)
Troponin I: <0.03 ng/mL (ref <0.03)

## 2017-03-24 LAB — CBC
HEMATOCRIT: 29.5 % — AB (ref 36.0–46.0)
HEMOGLOBIN: 9.8 g/dL — AB (ref 12.0–15.0)
MCH: 29.7 pg (ref 26.0–34.0)
MCHC: 33.2 g/dL (ref 30.0–36.0)
MCV: 89.4 fL (ref 78.0–100.0)
Platelets: 245 10*3/uL (ref 150–400)
RBC: 3.3 MIL/uL — AB (ref 3.87–5.11)
RDW: 14.8 % (ref 11.5–15.5)
WBC: 4.3 10*3/uL (ref 4.0–10.5)

## 2017-03-24 LAB — HEPARIN LEVEL (UNFRACTIONATED): HEPARIN UNFRACTIONATED: 0.36 [IU]/mL (ref 0.30–0.70)

## 2017-03-24 LAB — HEPATIC FUNCTION PANEL
ALBUMIN: 3.3 g/dL — AB (ref 3.5–5.0)
ALK PHOS: 146 U/L — AB (ref 38–126)
ALT: 460 U/L — ABNORMAL HIGH (ref 14–54)
AST: 234 U/L — AB (ref 15–41)
Bilirubin, Direct: 0.1 mg/dL (ref 0.1–0.5)
Indirect Bilirubin: 0.4 mg/dL (ref 0.3–0.9)
TOTAL PROTEIN: 5.9 g/dL — AB (ref 6.5–8.1)
Total Bilirubin: 0.5 mg/dL (ref 0.3–1.2)

## 2017-03-24 LAB — EPSTEIN-BARR VIRUS VCA, IGM: EBV VCA IgM: <36 U/mL (ref 0.0–35.9)

## 2017-03-24 LAB — HEPATITIS PANEL, ACUTE
HCV Ab: 0.1 {s_co_ratio} (ref 0.0–0.9)
Hep A IgM: NEGATIVE
Hep B C IgM: NEGATIVE
Hepatitis B Surface Ag: NEGATIVE

## 2017-03-24 LAB — CMV IGM: CMV IgM: <30 [AU]/ml (ref 0.0–29.9)

## 2017-03-24 MED ORDER — IOPAMIDOL (ISOVUE-300) INJECTION 61%
INTRAVENOUS | Status: AC
  Administered 2017-03-24: 75 mL via INTRAVENOUS
  Filled 2017-03-24: qty 75

## 2017-03-24 MED ORDER — ALBUTEROL SULFATE (2.5 MG/3ML) 0.083% IN NEBU: 2.5000 mg | INHALATION_SOLUTION | RESPIRATORY_TRACT | Status: DC | PRN

## 2017-03-24 NOTE — Progress Notes (Signed)
*  PRELIMINARY RESULTS* Echocardiogram 2D Echocardiogram has been performed.  Gabrielle Page 03/24/2017, 12:12 PM

## 2017-03-24 NOTE — Plan of Care (Signed)
Daily Labs. Monitor Liver enzymes and Troponin. CT angio ordered. POC CABG.

## 2017-03-24 NOTE — Progress Notes (Signed)
Progress Note  Patient Name: Gabrielle Page Date of Encounter: 03/24/2017  Primary Cardiologist:  Martinique   Subjective    57 year old female with a history of coronary artery disease-status post ST segment elevation myocardial infarction in September.. She was admitted on February 7 with crescendo angina.  She was found to have a markedly positive Myoview study with reversible anterior and anterolateral/anteroseptal defect.  She was started on heparin drip and was transferred to [facility].  Heart catheterization on February 8 revealed ostial LAD stenosis of 80%.  Mid LAD stent is widely patent.  There is a 40% distal left main stenosis.  First diagonal artery has an 85% stenosis.  She has been seen by surgery and the plan is to perform coronary artery bypass grafting once her Brilinta washes out    Inpatient Medications    Scheduled Meds: . aspirin EC  81 mg Oral Daily  . Influenza vac split quadrivalent PF  0.5 mL Intramuscular Tomorrow-1000  . metoprolol tartrate  50 mg Oral BID  . multivitamin with minerals  1 tablet Oral Daily  . pneumococcal 23 valent vaccine  0.5 mL Intramuscular Tomorrow-1000  . potassium chloride  20 mEq Oral BID  . sodium chloride flush  3 mL Intravenous Q12H   Continuous Infusions: . sodium chloride    . heparin 950 Units/hr (03/23/17 2338)   PRN Meds: sodium chloride, acetaminophen, ALPRAZolam, nitroGLYCERIN, ondansetron (ZOFRAN) IV, sodium chloride flush   Vital Signs    Vitals:   03/23/17 1825 03/23/17 1834 03/23/17 2015 03/24/17 0604  BP: 124/68 107/63 133/79 136/73  Pulse:   73 68  Resp:   18 18  Temp:   98.1 F (36.7 C) (!) 97.5 F (36.4 C)  TempSrc:   Oral Oral  SpO2:   100% 100%  Weight:    144 lb 1.6 oz (65.4 kg)  Height:        Intake/Output Summary (Last 24 hours) at 03/24/2017 0835 Last data filed at 03/23/2017 2151 Gross per 24 hour  Intake 963 ml  Output -  Net 963 ml   Filed Weights   03/22/17 0630 03/23/17  0543 03/24/17 0604  Weight: 141 lb 8.6 oz (64.2 kg) 142 lb 3.2 oz (64.5 kg) 144 lb 1.6 oz (65.4 kg)    Telemetry    NSR  - Personally Reviewed  ECG     NSR , LBBB  - Personally Reviewed  Physical Exam   GEN: No acute distress.   Neck: No JVD Cardiac: RRR,  Soft systolic murmur  Respiratory: Clear to auscultation bilaterally. GI: Soft, nontender, non-distended  MS: No edema; No deformity. Neuro:  Nonfocal  Psych: Normal affect   Labs    Chemistry Recent Labs  Lab 03/21/17 2253 03/23/17 0653 03/23/17 1453 03/24/17 0235  NA 141 140 139 138  K 3.6 3.3* 3.8 3.7  CL 105 105 102 104  CO2 23 24 25 25   GLUCOSE 111* 102* 126* 110*  BUN 19 14 15 12   CREATININE 0.62 0.54 0.55 0.47  CALCIUM 9.0 9.2 9.3 8.9  PROT 5.9* 6.3* 6.3*  --   ALBUMIN 3.5 3.5 3.5  --   AST 21 443* 351*  --   ALT 20 519* 512*  --   ALKPHOS 51 118 138*  --   BILITOT 0.3 0.6 0.5  --   GFRNONAA >60 >60 >60 >60  GFRAA >60 >60 >60 >60  ANIONGAP 13 11 12 9      Hematology Recent Labs  Lab  03/22/17 0513 03/23/17 0653 03/24/17 0235  WBC 5.3 3.9* 4.3  RBC 3.44* 3.46* 3.30*  HGB 10.1* 10.2* 9.8*  HCT 31.1* 31.2* 29.5*  MCV 90.4 90.2 89.4  MCH 29.4 29.5 29.7  MCHC 32.5 32.7 33.2  RDW 15.2 15.0 14.8  PLT 270 270 245    Cardiac Enzymes Recent Labs  Lab 03/21/17 2253  TROPONINI <0.03   No results for input(s): TROPIPOC in the last 168 hours.   BNPNo results for input(s): BNP, PROBNP in the last 168 hours.   DDimer No results for input(s): DDIMER in the last 168 hours.   Radiology    Dg Chest 2 View  Result Date: 03/23/2017 CLINICAL DATA:  Chest pain.  Preop for heart surgery EXAM: CHEST  2 VIEW COMPARISON:  None. FINDINGS: Normal heart size and mediastinal contours. Coronary stent noted. No acute infiltrate or edema. No effusion or pneumothorax. No acute osseous findings. IMPRESSION: Negative chest. Electronically Signed   By: Monte Fantasia M.D.   On: 03/23/2017 09:33    Cardiac Studies      Patient Profile     57 y.o. female with a new diagnosis of coronary artery disease.  She has acute hepatic failure.  We have consulted the internal medicine team to help [place] sort that out.  Assessment & Plan    1.  Coronary artery disease: The patient status post ST segment elevation myocardial infarction in September.  She now was admitted with crescendo angina.  Heart catheterization reveals a left main/LAD stenosis.  Because of the complexity of the LAD/left main stenosis and a diagonal stenosis, she will have coronary artery bypass grafting.  She developed acute elevation in her SGOT and SGPT yesterday. She remains pain-free at this point.  Will need to allow her liver enzymes to improve before she has bypass surgery.  2.  Elevated SGPT and SGOT.  I appreciate the assistance of the tried hospitalist team.  Abdominal ultrasound was performed this morning.  Further recommendations per internal medicine.  3.  Hypokalemia: Potassium level is better.   For questions or updates, please contact [place] Please consult [email or web address] for contact info under Cardiology/STEMI.      Signed, Mertie Moores, MD  03/24/2017, 8:35 AM

## 2017-03-24 NOTE — Consult Note (Signed)
[address]       [place] [address]             [phone]        Adelayde Creasey [place] Medical Record #[id number] Date of Birth: Jan 07, 1961  Referring: P Martinique , MD Primary Care: [name], NP Primary Cardiologist:P Nahser MD  Chief Complaint:   Chest pain unstable angina  History of Present Illness:     Patient examined, coronary angiograms images personally reviewed and discussed with patient  57 year old female presents with symptoms of unstable angina and nonspecific EKG changes. She had an anterior MI in September 2018 and her symptoms were similar. At the time of her MI she had a drug-eluting stent placed to the proximal LAD which also partially covered a diagonal branch. She did well until recently when she developed chest pain and shortness of breath with minimal activity. A myocardial nuclear scan showed severe anterior ischemia although enzymes are negative. She is placed on IV heparin and transferred to this hospital and underwent cardiac catheterization. This demonstrated a moderate left main stenosis with a high-grade 80% stenosis of the LAD proximal to the patent stent with a stenosis of the origin of a diagonal branch. She is felt to have left main disease proximal LAD diagonal stenosis and was recommended for CABG. I reviewed her coronary angiogram images and examined the patient and felt that she would 3 appropriate candidate for grafts to the LAD diagonal and OM-I agreed with the recommendation by her cardiologist for CABG. The patient has remained stable on IV heparin. Echocardiogram is pending.  The patient is been on [place] for 6 months which is now held with plans for multivessel CABG on Tuesday, February 12.  The patient has elevated LFTs with previous cholecystectomy and bile duct exploration performed at outside hospital.  Current Activity/ Functional Status: Patient is currently not working, living at home with husband   Zubrod Score: At the time of surgery this patient's most appropriate activity status/level should be described as: []     0    Normal activity, no symptoms []     1    Restricted in physical strenuous activity but ambulatory, able to do out light work []     2    Ambulatory and capable of self care, unable to do work activities, up and about                 more than 50%  Of the time                            [x]     3    Only limited self care, in bed greater than 50% of waking hours []     4    Completely disabled, no self care, confined to bed or chair []     5    Moribund  Past Medical History:  Diagnosis Date  . Asthma   . Chronic bronchitis ([place])    "usually get it q yr" (11/05/2016)  . GERD (gastroesophageal reflux disease)   . Headache    "weekly" (11/05/2016)  . Heart murmur    "dx'd when I was a child"  . Hepatitis 1991   "when I had my gallbladder out" (11/05/2016)  . History of blood transfusion 1985   S/P childbirth  . History of kidney stones   . NSTEMI (non-ST elevated myocardial infarction) ([place]) 11/05/2016   9/18  PCI/DESx1 to p/mLAD  . Pancreatitis 1991  . Pneumonia    "twice" (11/05/2016)    Past Surgical History:  Procedure Laterality Date  . ABDOMINAL HYSTERECTOMY  1990  . APPENDECTOMY  1992  . BILE DUCT EXPLORATION  1991   "reconstruction"  . CESAREAN SECTION  1985  . CHOLECYSTECTOMY OPEN  1991  . CORONARY ANGIOPLASTY WITH STENT PLACEMENT  11/05/2016  . CORONARY STENT INTERVENTION N/A 11/05/2016   Procedure: CORONARY STENT INTERVENTION;  Surgeon: Martinique, Caralyn Twining M, MD;  Location: [facility];  Service: Cardiovascular;  Laterality: N/A;  . CYSTOSCOPY WITH STENT PLACEMENT  "3 times" (11/05/2016)  . DILATION AND CURETTAGE OF UTERUS  1989  . KNEE ARTHROSCOPY Right 2000s  . LEFT HEART CATH AND CORONARY ANGIOGRAPHY N/A 11/05/2016   Procedure: LEFT HEART CATH AND CORONARY ANGIOGRAPHY;  Surgeon: Martinique, Kolbi Altadonna M, MD;  Location: [facility];  Service:  Cardiovascular;  Laterality: N/A;  . NASAL SINUS SURGERY  ~ 2009   "exposed to mildew; had to have the infectoin drained; cut the outside of my face"  . OVARIAN CYST SURGERY  X 6    Social History   Tobacco Use  Smoking Status Never Smoker  Smokeless Tobacco Never Used    Social History   Substance and Sexual Activity  Alcohol Use Yes   Comment: 11/05/2016 "maybe 1 glass of wine/month"    Social History   Socioeconomic History  . Marital status: Divorced    Spouse name: Not on file  . Number of children: Not on file  . Years of education: Not on file  . Highest education level: Not on file  Social Needs  . Financial resource strain: Not on file  . Food insecurity - worry: Not on file  . Food insecurity - inability: Not on file  . Transportation needs - medical: Not on file  . Transportation needs - non-medical: Not on file  Occupational History  . Not on file  Tobacco Use  . Smoking status: Never Smoker  . Smokeless tobacco: Never Used  Substance and Sexual Activity  . Alcohol use: Yes    Comment: 11/05/2016 "maybe 1 glass of wine/month"  . Drug use: No  . Sexual activity: Not Currently  Other Topics Concern  . Not on file  Social History Narrative  . Not on file    Allergies  Allergen Reactions  . Compazine [Prochlorperazine Edisylate] Other (See Comments)    siezure  . Other Other (See Comments)    Steroids cause uncontrollable coughing    Current Facility-Administered Medications  Medication Dose Route Frequency Provider Last Rate Last Dose  . 0.9 %  sodium chloride infusion  250 mL Intravenous PRN Martinique, Hulon Ferron M, MD      . acetaminophen (TYLENOL) tablet 650 mg  650 mg Oral Q4H PRN Doylene Canning, MD   650 mg at 03/23/17 2150  . ALPRAZolam Duanne Moron) tablet 1 mg  1 mg Oral BID PRN Martinique, Eston Heslin M, MD   1 mg at 03/23/17 2150  . aspirin EC tablet 81 mg  81 mg Oral Daily Chakravartti, Jaidip, MD   81 mg at 03/23/17 0907  . heparin ADULT infusion 100  units/mL (25000 units/266mL sodium chloride 0.45%)  950 Units/hr Intravenous Continuous Charlton Haws, RPH 9.5 mL/hr at 03/23/17 2338 950 Units/hr at 03/23/17 2338  . Influenza vac split quadrivalent PF (FLUARIX) injection 0.5 mL  0.5 mL Intramuscular Tomorrow-1000 [name], NP      . metoprolol tartrate (LOPRESSOR)  tablet 50 mg  50 mg Oral BID Chakravartti, Jaidip, MD   50 mg at 03/23/17 2150  . multivitamin with minerals tablet 1 tablet  1 tablet Oral Daily Chakravartti, Jaidip, MD   1 tablet at 03/23/17 0907  . nitroGLYCERIN (NITROSTAT) SL tablet 0.4 mg  0.4 mg Sublingual Q5 Min x 3 PRN Chakravartti, Jaidip, MD   0.4 mg at 03/23/17 1825  . ondansetron (ZOFRAN) injection 4 mg  4 mg Intravenous Q6H PRN Chakravartti, Jaidip, MD      . pneumococcal 23 valent vaccine (PNU-IMMUNE) injection 0.5 mL  0.5 mL Intramuscular Tomorrow-1000 [name], NP      . potassium chloride SA (K-DUR,KLOR-CON) CR tablet 20 mEq  20 mEq Oral BID Nahser, Wonda Cheng, MD   20 mEq at 03/23/17 2149  . sodium chloride flush (NS) 0.9 % injection 3 mL  3 mL Intravenous Q12H Martinique, Audelia Knape M, MD   3 mL at 03/23/17 2151  . sodium chloride flush (NS) 0.9 % injection 3 mL  3 mL Intravenous PRN Martinique, Theophil Thivierge M, MD        Medications Prior to Admission  Medication Sig Dispense Refill Last Dose  . aspirin 81 MG chewable tablet Chew 1 tablet (81 mg total) by mouth daily.   03/22/2017 at Unknown time  . isosorbide mononitrate (IMDUR) 30 MG 24 hr tablet Take 1 tablet (30 mg total) by mouth daily. 30 tablet 5 03/21/2017 at Unknown time  . metoprolol tartrate (LOPRESSOR) 50 MG tablet Take 1 tablet (50 mg total) by mouth 2 (two) times daily. 180 tablet 2 03/22/2017 at AM  . montelukast (SINGULAIR) 10 MG tablet Take 10 mg by mouth at bedtime.   03/21/2017 at Unknown time  . Multiple Vitamin (MULTIVITAMIN WITH MINERALS) TABS tablet Take 1 tablet by mouth daily.   03/21/2017 at Unknown time  . nitroGLYCERIN (NITROSTAT) 0.4 MG SL tablet Place  1 tablet (0.4 mg total) under the tongue every 5 (five) minutes as needed. 25 tablet 0  at PRN  . pravastatin (PRAVACHOL) 80 MG tablet Take 1 tablet (80 mg total) by mouth every evening. 30 tablet 5 03/21/2017 at Unknown time  . ticagrelor (BRILINTA) 90 MG TABS tablet Take 1 tablet (90 mg total) by mouth 2 (two) times daily. 180 tablet 2 03/21/2017 at Unknown time    Family History  Problem Relation Age of Onset  . Hypertension Father   . Alcohol abuse Father   . Heart attack Father   . Diabetes Mellitus II Father   . Suicidality Mother      Review of Systems:   ROS      Cardiac Review of Systems: Y or  [  no  ]= no  Chest Pain [ yes   ]  Resting SOB [ no  ] Exertional SOB  [ yes ]  Orthopnea [  yes]   Pedal Edema [ no  ]    Palpitations [place]  ] Syncope  [no  ]   Presyncope [  no ]  General Review of Systems: [Y] = yes [  ]=no Constitional: recent weight change [  ]; anorexia [  ]; fatigue [ yes ]; nausea [ yes ]; night sweats [  ]; fever [  ]; or chills [  ]  Dental: poor dentition[  ]; Last Dentist visit: One year  Eye : blurred vision [  ]; diplopia [   ]; vision changes [  ];  Amaurosis fugax[  ]; Resp: cough [  ];  wheezing[  ];  hemoptysis[  ]; shortness of breath[ yes ]; paroxysmal nocturnal dyspnea[  ]; dyspnea on exertion[  ]; or orthopnea[  ];  GI:  gallstones[ previous cholecystectomy ], vomiting[ yes ];  dysphagia[  ]; melena[  ];  hematochezia [  ]; heartburn[  ];   Hx of  Colonoscopy[  ]; GU: kidney stones [  ]; hematuria[  ];   dysuria [  ];  nocturia[  ];  history of     obstruction [  ]; urinary frequency [  ]             Skin: rash, swelling[  ];, hair loss[  ];  peripheral edema[  ];  or itching[  ]; Musculosketetal: myalgias[  ];  joint swelling[  ];  joint erythema[  ];  joint pain[  ];  back pain[ yes ];  Heme/Lymph: bruising[  ];  bleeding[  ];  anemia[  ];  Neuro: TIA[  ];  headaches[  ];  stroke[  ];   vertigo[  ];  seizures[  ];   paresthesias[  ];  difficulty walking[  ];  Psych:depression[  ]; anxiety[  ];  Endocrine: diabetes[  ];  thyroid dysfunction[  ];  Immunizations: Flu [  ]; Pneumococcal[  ];    Physical Exam: BP 136/73 (BP Location: Left Arm)   Pulse 68   Temp (!) 97.5 F (36.4 C) (Oral)   Resp 18   Ht 5\' 3"  (1.6 m)   Wt 144 lb 1.6 oz (65.4 kg)   SpO2 100%   BMI 25.53 kg/m        Physical Exam  General: Thin middle-aged female anxious but in no acute distress HEENT: Normocephalic pupils equal , dentition adequate Neck: Supple without JVD, adenopathy, or bruit Chest: Clear to auscultation, symmetrical breath sounds, no rhonchi, no tenderness             or deformity Cardiovascular: Regular rate and rhythm, no murmur, no gallop, peripheral pulses             palpable in all extremities Abdomen:  Soft, nontender, no palpable mass or organomegaly. Well-healed surgical scars. Extremities: Warm, well-perfused, no clubbing cyanosis edema or tenderness,              no venous stasis changes of the legs Rectal/GU: Deferred Neuro: Grossly non--focal and symmetrical throughout Skin: Clean and dry without rash or ulceration   Diagnostic Studies & Laboratory data:     Recent Radiology Findings:   Dg Chest 2 View  Result Date: 03/23/2017 CLINICAL DATA:  Chest pain.  Preop for heart surgery EXAM: CHEST  2 VIEW COMPARISON:  None. FINDINGS: Normal heart size and mediastinal contours. Coronary stent noted. No acute infiltrate or edema. No effusion or pneumothorax. No acute osseous findings. IMPRESSION: Negative chest. Electronically Signed   By: Monte Fantasia M.D.   On: 03/23/2017 09:33     I have independently reviewed the above radiologic studies.  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  Plan multivessel CABG on Tuesday, February 12. Patient will need preoperative echocardiogram and CT scan of chest as well as pre-CABG Dopplers and PFTs      @ME1 @ 03/24/2017 9:04 AM

## 2017-03-24 NOTE — Progress Notes (Signed)
[facility] PROGRESS NOTE  Deitra Craine IRJ:[id number] DOB: [birth year] DOA: 03/21/2017 PCP: [name], NP  Assessment/Plan:  Elevated LFTs: Her LFT elevation so soon after a normal value approximately 2 days ago is concerning for a fairly acute event.  She has good synthetic function and that her INR is normal.  However she does have right upper quadrant pain and a history of surgical procedures.  She has been exposed to some new agents including contrast however none of her medications listed other than montelukast which has been associated with some hepatotoxicity in case reports an will hold statins for now. -Repeat hepatic panel better today, will monitor -Hold pravastatin, hold montelukast - Abdominal ultrasound with Dopplers--> neg for abns -Follow-up acute hepatitis panel, EBV, CMV - LFTS normalizing - cause: passage of small stone? Vs contrast from heart cath  ##) Mild intermittent asthma: Triggers apparently appear to be allergies -Hold montelukast  ##) Chest pain: Defer further cardiac workup to cardiology    HPI/Subjective: Pt well and no complaints.  Objective: Vitals:   03/23/17 2015 03/24/17 0604  BP: 133/79 136/73  Pulse: 73 68  Resp: 18 18  Temp: 98.1 F (36.7 C) (!) 97.5 F (36.4 C)  SpO2: 100% 100%    Intake/Output Summary (Last 24 hours) at 03/24/2017 1422 Last data filed at 03/24/2017 1300 Gross per 24 hour  Intake 843 ml  Output -  Net 843 ml   Filed Weights   03/22/17 0630 03/23/17 0543 03/24/17 0604  Weight: 64.2 kg (141 lb 8.6 oz) 64.5 kg (142 lb 3.2 oz) 65.4 kg (144 lb 1.6 oz)    Exam:   General:  NAD, NCAT  Cardiovascular: RRR, no MRG  Respiratory: CTAB, nl wob  Abdomen: NT, ND, BS+  Musculoskeletal: Moving all extr   Data Reviewed: Basic Metabolic Panel: Recent Labs  Lab 03/21/17 2253 03/23/17 0653 03/23/17 1453 03/24/17 0235  NA 141 140 139 138  K 3.6 3.3* 3.8 3.7  CL 105 105 102 104  CO2 23 24 25 25    GLUCOSE 111* 102* 126* 110*  BUN 19 14 15 12   CREATININE 0.62 0.54 0.55 0.47  CALCIUM 9.0 9.2 9.3 8.9   Liver Function Tests: Recent Labs  Lab 03/21/17 2253 03/23/17 0653 03/23/17 1453 03/24/17 0235  AST 21 443* 351* 234*  ALT 20 519* 512* 460*  ALKPHOS 51 118 138* 146*  BILITOT 0.3 0.6 0.5 0.5  PROT 5.9* 6.3* 6.3* 5.9*  ALBUMIN 3.5 3.5 3.5 3.3*   No results for input(s): LIPASE, AMYLASE in the last 168 hours. No results for input(s): AMMONIA in the last 168 hours. CBC: Recent Labs  Lab 03/21/17 2253 03/22/17 0513 03/23/17 0653 03/24/17 0235  WBC 6.4 5.3 3.9* 4.3  NEUTROABS 4.1  --   --   --   HGB 9.7* 10.1* 10.2* 9.8*  HCT 29.8* 31.1* 31.2* 29.5*  MCV 90.0 90.4 90.2 89.4  PLT 268 270 270 245   Cardiac Enzymes: Recent Labs  Lab 03/21/17 2253 03/24/17 1249  TROPONINI <0.03 <0.03   BNP (last 3 results) No results for input(s): BNP in the last 8760 hours.  ProBNP (last 3 results) No results for input(s): PROBNP in the last 8760 hours.  CBG: No results for input(s): GLUCAP in the last 168 hours.  No results found for this or any previous visit (from the past 240 hour(s)).   Studies: Dg Chest 2 View  Result Date: 03/23/2017 CLINICAL DATA:  Chest pain.  Preop for heart surgery EXAM:  CHEST  2 VIEW COMPARISON:  None. FINDINGS: Normal heart size and mediastinal contours. Coronary stent noted. No acute infiltrate or edema. No effusion or pneumothorax. No acute osseous findings. IMPRESSION: Negative chest. Electronically Signed   By: Monte Fantasia M.D.   On: 03/23/2017 09:33   Ct Chest W Contrast  Result Date: 03/24/2017 CLINICAL DATA:  Pleural effusion, chest pain. EXAM: CT CHEST WITH CONTRAST TECHNIQUE: Multidetector CT imaging of the chest was performed during intravenous contrast administration. CONTRAST:  75mL ISOVUE-300 IOPAMIDOL (ISOVUE-300) INJECTION 61% COMPARISON:  Chest x-ray 03/23/2017.  Chest CT 11/05/2016. FINDINGS: Cardiovascular: Stent noted in the  left anterior descending coronary artery. Heart is normal size. Aorta is normal caliber. No filling defects in the pulmonary arteries to suggest pulmonary emboli. Mediastinum/Nodes: No mediastinal, hilar, or axillary adenopathy. Trachea and esophagus are unremarkable. Small hiatal hernia. Lungs/Pleura: Lungs are clear. No focal airspace opacities or suspicious nodules. No effusions. Upper Abdomen: Pneumobilia noted within the liver and common bile duct, presumably from prior sphincterotomy. Musculoskeletal: Chest wall soft tissues are unremarkable. No acute bony abnormality. IMPRESSION: No evidence of pulmonary embolus. No acute cardiopulmonary disease. Lad stent noted. Pneumobilia, presumably from prior sphincterotomy. Recommend clinical correlation. Electronically Signed   By: Rolm Baptise M.D.   On: 03/24/2017 14:13   US Liver Doppler  Result Date: 03/24/2017 CLINICAL DATA:  Elevated LFTs EXAM: DUPLEX ULTRASOUND OF LIVER TECHNIQUE: Color and duplex Doppler ultrasound was performed to evaluate the hepatic in-flow and out-flow vessels. 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 IMPRESSION: Normal portal vein and  hepatic vein velocities and waveforms. Electronically Signed   By: Suzy Bouchard M.D.   On: 03/24/2017 10:48   US Abdomen Limited Ruq  Result Date: 03/24/2017 CLINICAL DATA:  Elevated liver enzymes EXAM: ULTRASOUND ABDOMEN LIMITED RIGHT UPPER QUADRANT COMPARISON:  CT 11/05/2016 FINDINGS: Gallbladder: Surgically absent Common bile duct: Diameter: Normal 3.3 mm Liver: Normal echogenicity. No biliary duct  dilatation. Small amount pneumobilia in the LEFT hepatic lobe compares with prior CT. Portal vein is patent on color Doppler imaging with normal direction of blood flow towards the liver. IMPRESSION: 1. No acute findings the liver. Normal echogenicity. No biliary duct dilatation. 2. Pneumobilia presumed related to prior sphincterotomy and not changed from comparison CT Electronically Signed   By: Suzy Bouchard M.D.   On: 03/24/2017 10:53    Scheduled Meds: . aspirin EC  81 mg Oral Daily  . Influenza vac split quadrivalent PF  0.5 mL Intramuscular Tomorrow-1000  . metoprolol tartrate  50 mg Oral BID  . multivitamin with minerals  1 tablet Oral Daily  . pneumococcal 23 valent vaccine  0.5 mL Intramuscular Tomorrow-1000  . potassium chloride  20 mEq Oral BID  . sodium chloride flush  3 mL Intravenous Q12H   Continuous Infusions: . sodium chloride    . heparin 950 Units/hr (03/23/17 2338)    Active Problems:   Unstable angina (HCC)   Elevated LFTs    Time spent: [facility] Pager [place]. If 7PM-7AM, please contact night-coverage at [email or web address], password [facility] 03/24/2017, 2:22 PM  LOS: 3 days

## 2017-03-24 NOTE — Progress Notes (Addendum)
ANTICOAGULATION CONSULT NOTE - Follow Up Consult  Pharmacy Consult for heparin Indication: chest pain/ACS - possible CABG Tues 2/12  Allergies  Allergen Reactions  . Compazine [Prochlorperazine Edisylate] Other (See Comments)    siezure  . Other Other (See Comments)    Steroids cause uncontrollable coughing    Patient Measurements: Height: 5\' 3"  (160 cm) Weight: 144 lb 1.6 oz (65.4 kg) IBW/kg (Calculated) : 52.4 Heparin Dosing Weight: 64.4 kg  Vital Signs: Temp: 97.5 F (36.4 C) (02/10 0604) Temp Source: Oral (02/10 0604) BP: 136/73 (02/10 0604) Pulse Rate: 68 (02/10 0604)  Labs: Recent Labs    03/21/17 2253 03/22/17 0513 03/23/17 0653 03/23/17 1453 03/24/17 0235  HGB 9.7* 10.1* 10.2*  --  9.8*  HCT 29.8* 31.1* 31.2*  --  29.5*  PLT 268 270 270  --  245  APTT >200*  --   --   --   --   LABPROT 12.8  --  12.4  --  12.2  INR 0.97  --  0.93  --  0.91  HEPARINUNFRC  --  0.80* 0.31  --  0.36  CREATININE 0.62  --  0.54 0.55 0.47  TROPONINI <0.03  --   --   --   --     Estimated Creatinine Clearance: 73.1 mL/min (by C-G formula based on SCr of 0.47 mg/dL).   Medications:  Scheduled:  . aspirin EC  81 mg Oral Daily  . Influenza vac split quadrivalent PF  0.5 mL Intramuscular Tomorrow-1000  . metoprolol tartrate  50 mg Oral BID  . multivitamin with minerals  1 tablet Oral Daily  . pneumococcal 23 valent vaccine  0.5 mL Intramuscular Tomorrow-1000  . potassium chloride  20 mEq Oral BID  . sodium chloride flush  3 mL Intravenous Q12H   Infusions:  . sodium chloride    . heparin 950 Units/hr (03/23/17 2338)    Assessment: 57 y/o female with hx of STEMI (DES 10/2016) on heparin s/p cath revealing progression of stenosis of LAD. Heparin was restarted post-cath. Brilinta is currently on hold in preparation for CABG. Of note patient is currently undergoing liver workup for acutely elevated AST/ALT.   Heparin level 0.36 today, at low end of goal range after slight rate  increase yesterday. CBC is down slightly but near baseline, no bleeding noted.  Goal of Therapy:  Heparin level 0.3-0.7 units/ml Monitor platelets by anticoagulation protocol: Yes   Plan:  Continue heparin to 950 units/hr Daily heparin level, CBC Monitor s/sx of bleeding CABG planned for Tues 2/12 - after Brilinta washout (pending liver workup)   Charlene Brooke, PharmD PGY1 Pharmacy Resident Phone: [phone] After 3:30PM please call [place] 03/24/2017,9:33 AM

## 2017-03-24 NOTE — Progress Notes (Signed)
Carotid duplex prelim: 1-39% ICA stenosis. UE Doppler prelim: right obliterates with radial compression, normal with ulnar compression. Left radial and ulnar decreases >50% with compression. Pedal artery waveforms WNL. Landry Mellow, RDMS, RVT

## 2017-03-25 ENCOUNTER — Other Ambulatory Visit (HOSPITAL_COMMUNITY): Payer: Medicaid Other

## 2017-03-25 ENCOUNTER — Inpatient Hospital Stay (HOSPITAL_COMMUNITY): Payer: Self-pay

## 2017-03-25 ENCOUNTER — Ambulatory Visit: Payer: Medicaid Other | Admitting: Physician Assistant

## 2017-03-25 ENCOUNTER — Other Ambulatory Visit: Payer: Self-pay

## 2017-03-25 ENCOUNTER — Encounter (HOSPITAL_COMMUNITY): Payer: Self-pay | Admitting: Cardiology

## 2017-03-25 LAB — URINALYSIS, ROUTINE W REFLEX MICROSCOPIC
Bilirubin Urine: NEGATIVE
Glucose, UA: NEGATIVE mg/dL
Ketones, ur: NEGATIVE mg/dL
Leukocytes, UA: NEGATIVE
Nitrite: NEGATIVE
Protein, ur: NEGATIVE mg/dL
Specific Gravity, Urine: 1.019 (ref 1.005–1.030)
pH: 6 (ref 5.0–8.0)

## 2017-03-25 LAB — PULMONARY FUNCTION TEST
FEF 25-75 Pre: 2.23 L/sec
FEF2575-%Pred-Pre: 88 %
FEV1-%Pred-Pre: 97 %
FEV1-Pre: 2.53 L
FEV1FVC-%Pred-Pre: 100 %
FEV6-%Pred-Pre: 98 %
FEV6-Pre: 3.18 L
FEV6FVC-%Pred-Pre: 103 %
FVC-%Pred-Pre: 95 %
FVC-Pre: 3.18 L
Pre FEV1/FVC ratio: 79 %
Pre FEV6/FVC Ratio: 100 %

## 2017-03-25 LAB — PREPARE RBC (CROSSMATCH)

## 2017-03-25 LAB — CBC
HCT: 30.9 % — ABNORMAL LOW (ref 36.0–46.0)
Hemoglobin: 10.1 g/dL — ABNORMAL LOW (ref 12.0–15.0)
MCH: 29.4 pg (ref 26.0–34.0)
MCHC: 32.7 g/dL (ref 30.0–36.0)
MCV: 89.8 fL (ref 78.0–100.0)
PLATELETS: 256 10*3/uL (ref 150–400)
RBC: 3.44 MIL/uL — AB (ref 3.87–5.11)
RDW: 15.1 % (ref 11.5–15.5)
WBC: 5.7 10*3/uL (ref 4.0–10.5)

## 2017-03-25 LAB — BLOOD GAS, ARTERIAL
Acid-Base Excess: 4.8 mmol/L — ABNORMAL HIGH (ref 0.0–2.0)
Bicarbonate: 28.9 mmol/L — ABNORMAL HIGH (ref 20.0–28.0)
Drawn by: 275531
O2 Saturation: 96.4 %
Patient temperature: 98.6
pCO2 arterial: 44 mmHg (ref 32.0–48.0)
pH, Arterial: 7.433 (ref 7.350–7.450)
pO2, Arterial: 86.1 mmHg (ref 83.0–108.0)

## 2017-03-25 LAB — COMPREHENSIVE METABOLIC PANEL
ALT: 362 U/L — ABNORMAL HIGH (ref 14–54)
ANION GAP: 10 (ref 5–15)
AST: 121 U/L — ABNORMAL HIGH (ref 15–41)
Albumin: 3.5 g/dL (ref 3.5–5.0)
Alkaline Phosphatase: 165 U/L — ABNORMAL HIGH (ref 38–126)
BILIRUBIN TOTAL: 0.4 mg/dL (ref 0.3–1.2)
BUN: 8 mg/dL (ref 6–20)
CHLORIDE: 101 mmol/L (ref 101–111)
CO2: 27 mmol/L (ref 22–32)
Calcium: 9.7 mg/dL (ref 8.9–10.3)
Creatinine, Ser: 0.53 mg/dL (ref 0.44–1.00)
Glucose, Bld: 101 mg/dL — ABNORMAL HIGH (ref 65–99)
POTASSIUM: 4.1 mmol/L (ref 3.5–5.1)
Sodium: 138 mmol/L (ref 135–145)
Total Protein: 6.3 g/dL — ABNORMAL LOW (ref 6.5–8.1)

## 2017-03-25 LAB — SURGICAL PCR SCREEN
MRSA, PCR: NEGATIVE
Staphylococcus aureus: POSITIVE — AB

## 2017-03-25 LAB — ABO/RH: ABO/RH(D): A NEG

## 2017-03-25 LAB — HEPARIN LEVEL (UNFRACTIONATED): HEPARIN UNFRACTIONATED: 0.36 [IU]/mL (ref 0.30–0.70)

## 2017-03-25 MED ORDER — TRANEXAMIC ACID 1000 MG/10ML IV SOLN
1.5000 mg/kg/h | INTRAVENOUS | Status: AC
  Administered 2017-03-26: 1.5 mg/kg/h via INTRAVENOUS
  Filled 2017-03-25: qty 25

## 2017-03-25 MED ORDER — ALBUTEROL SULFATE (2.5 MG/3ML) 0.083% IN NEBU
2.5000 mg | INHALATION_SOLUTION | Freq: Once | RESPIRATORY_TRACT | Status: AC
  Administered 2017-03-25: 2.5 mg via RESPIRATORY_TRACT

## 2017-03-25 MED ORDER — MAGNESIUM SULFATE 50 % IJ SOLN
40.0000 meq | INTRAMUSCULAR | Status: DC
  Filled 2017-03-25: qty 9.85

## 2017-03-25 MED ORDER — CHLORHEXIDINE GLUCONATE 4 % EX LIQD
60.0000 mL | Freq: Once | CUTANEOUS | Status: AC
  Administered 2017-03-26: 4 via TOPICAL
  Filled 2017-03-25: qty 60

## 2017-03-25 MED ORDER — TEMAZEPAM 15 MG PO CAPS: 15.0000 mg | ORAL_CAPSULE | Freq: Once | ORAL | Status: DC | PRN

## 2017-03-25 MED ORDER — PAPAVERINE HCL 30 MG/ML IJ SOLN
INTRAMUSCULAR | Status: AC
  Administered 2017-03-26: 500 mL
  Filled 2017-03-25: qty 2.5

## 2017-03-25 MED ORDER — EPINEPHRINE PF 1 MG/ML IJ SOLN
0.0000 ug/min | INTRAVENOUS | Status: DC
  Filled 2017-03-25: qty 4

## 2017-03-25 MED ORDER — SODIUM CHLORIDE 0.9 % IV SOLN
1.5000 g | INTRAVENOUS | Status: AC
  Administered 2017-03-26: 1.5 g via INTRAVENOUS
  Filled 2017-03-25: qty 1.5

## 2017-03-25 MED ORDER — DIAZEPAM 2 MG PO TABS
2.0000 mg | ORAL_TABLET | Freq: Once | ORAL | Status: AC
  Administered 2017-03-26: 2 mg via ORAL
  Filled 2017-03-25: qty 1

## 2017-03-25 MED ORDER — MILRINONE LACTATE IN DEXTROSE 20-5 MG/100ML-% IV SOLN
0.1250 ug/kg/min | INTRAVENOUS | Status: DC
  Filled 2017-03-25: qty 100

## 2017-03-25 MED ORDER — BISACODYL 5 MG PO TBEC
5.0000 mg | DELAYED_RELEASE_TABLET | Freq: Once | ORAL | Status: AC
  Administered 2017-03-25: 5 mg via ORAL
  Filled 2017-03-25: qty 1

## 2017-03-25 MED ORDER — SODIUM CHLORIDE 0.9 % IV SOLN
750.0000 mg | INTRAVENOUS | Status: DC
  Filled 2017-03-25: qty 750

## 2017-03-25 MED ORDER — POTASSIUM CHLORIDE 2 MEQ/ML IV SOLN
80.0000 meq | INTRAVENOUS | Status: DC
  Filled 2017-03-25: qty 40

## 2017-03-25 MED ORDER — CHLORHEXIDINE GLUCONATE 4 % EX LIQD
60.0000 mL | Freq: Once | CUTANEOUS | Status: AC
  Administered 2017-03-25: 4 via TOPICAL
  Filled 2017-03-25: qty 60

## 2017-03-25 MED ORDER — HEPARIN SODIUM (PORCINE) 1000 UNIT/ML IJ SOLN
INTRAMUSCULAR | Status: DC
  Filled 2017-03-25: qty 30

## 2017-03-25 MED ORDER — DOPAMINE-DEXTROSE 3.2-5 MG/ML-% IV SOLN
0.0000 ug/kg/min | INTRAVENOUS | Status: AC
  Administered 2017-03-26: 3 ug/kg/min via INTRAVENOUS
  Filled 2017-03-25: qty 250

## 2017-03-25 MED ORDER — TRANEXAMIC ACID (OHS) BOLUS VIA INFUSION
15.0000 mg/kg | INTRAVENOUS | Status: AC
Start: 2017-03-26 — End: 2017-03-26
  Administered 2017-03-26: 969 mg via INTRAVENOUS
  Filled 2017-03-25: qty 969

## 2017-03-25 MED ORDER — NITROGLYCERIN IN D5W 200-5 MCG/ML-% IV SOLN
2.0000 ug/min | INTRAVENOUS | Status: AC
  Administered 2017-03-26: 16.6 ug/min via INTRAVENOUS
  Filled 2017-03-25: qty 250

## 2017-03-25 MED ORDER — DEXMEDETOMIDINE HCL IN NACL 400 MCG/100ML IV SOLN
0.1000 ug/kg/h | INTRAVENOUS | Status: AC
  Administered 2017-03-26: 0.7 ug/kg/h via INTRAVENOUS
  Filled 2017-03-25: qty 100

## 2017-03-25 MED ORDER — SODIUM CHLORIDE 0.9 % IV SOLN
30.0000 ug/min | INTRAVENOUS | Status: AC
  Administered 2017-03-26: 40 ug/min via INTRAVENOUS
  Filled 2017-03-25: qty 2

## 2017-03-25 MED ORDER — CHLORHEXIDINE GLUCONATE 0.12 % MT SOLN
15.0000 mL | Freq: Once | OROMUCOSAL | Status: AC
  Administered 2017-03-26: 15 mL via OROMUCOSAL
  Filled 2017-03-25: qty 15

## 2017-03-25 MED ORDER — VANCOMYCIN HCL 10 G IV SOLR
1250.0000 mg | INTRAVENOUS | Status: AC
  Administered 2017-03-26: 1250 mg via INTRAVENOUS
  Filled 2017-03-25: qty 1250

## 2017-03-25 MED ORDER — INSULIN REGULAR HUMAN 100 UNIT/ML IJ SOLN
INTRAMUSCULAR | Status: AC
  Administered 2017-03-26: .9 [IU]/h via INTRAVENOUS
  Filled 2017-03-25: qty 1

## 2017-03-25 MED ORDER — TRANEXAMIC ACID (OHS) PUMP PRIME SOLUTION
2.0000 mg/kg | INTRAVENOUS | Status: DC
  Filled 2017-03-25: qty 1.29

## 2017-03-25 MED ORDER — METOPROLOL TARTRATE 12.5 MG HALF TABLET
12.5000 mg | ORAL_TABLET | Freq: Once | ORAL | Status: AC
  Administered 2017-03-26: 12.5 mg via ORAL
  Filled 2017-03-25: qty 1

## 2017-03-25 MED FILL — Heparin Sodium (Porcine) 2 Unit/ML in Sodium Chloride 0.9%: INTRAMUSCULAR | Qty: 1000 | Status: AC

## 2017-03-25 NOTE — Care Management Note (Signed)
Case Management Note  Patient Details  Name: Gabrielle Page MRN: [id number] Date of Birth: [birth year]  Subjective/Objective: Pt presented for angina. S/p cath 03-22-17-plan for CABG 03-26-17 post Brilinta Washout. Pt is without insurance and gets her medications from the [facility]. Pt will need an appointment for PCP once stable for d/c.                   Action/Plan: CM will continue to monitor post procedure.   Expected Discharge Date:                  Expected Discharge Plan:  Home/Self Care  In-House Referral:  NA  Discharge planning Services CM Consult, Medication Assistance    Post Acute Care Choice:    Choice offered to:     DME Arranged:    DME Agency:     HH Arranged:    HH Agency:     Status of Service:  In process, will continue to follow  If discussed at Long Length of Stay Meetings, dates discussed:    Additional Comments:  Bethena Roys, RN 03/25/2017, 4:41 PM

## 2017-03-25 NOTE — Progress Notes (Signed)
Progress Note  Patient Name: Gabrielle Page Date of Encounter: 03/25/2017  Primary Cardiologist:  Dr Martinique   Subjective   No chest pain- ambulating in the hall.   Inpatient Medications    Scheduled Meds: . aspirin EC  81 mg Oral Daily  . chlorhexidine  60 mL Topical Once   And  . [START ON 03/26/2017] chlorhexidine  60 mL Topical Once  . [START ON 03/26/2017] chlorhexidine  15 mL Mouth/Throat Once  . [START ON 03/26/2017] diazepam  2 mg Oral Once  . [START ON 03/26/2017] heparin-papaverine-plasmalyte irrigation   Irrigation To OR  . [START ON 03/26/2017] magnesium sulfate  40 mEq Other To OR  . [START ON 03/26/2017] metoprolol tartrate  12.5 mg Oral Once  . metoprolol tartrate  50 mg Oral BID  . multivitamin with minerals  1 tablet Oral Daily  . [START ON 03/26/2017] potassium chloride  80 mEq Other To OR  . potassium chloride  20 mEq Oral BID  . sodium chloride flush  3 mL Intravenous Q12H  . [START ON 03/26/2017] tranexamic acid  15 mg/kg Intravenous To OR  . [START ON 03/26/2017] tranexamic acid  2 mg/kg Intracatheter To OR   Continuous Infusions: . sodium chloride    . [START ON 03/26/2017] cefUROXime (ZINACEF)  IV    . [START ON 03/26/2017] cefUROXime (ZINACEF)  IV    . [START ON 03/26/2017] dexmedetomidine    . [START ON 03/26/2017] DOPamine    . [START ON 03/26/2017] epinephrine    . [START ON 03/26/2017] heparin 30,000 units/NS 1000 mL solution for CELLSAVER    . heparin 950 Units/hr (03/25/17 0444)  . [START ON 03/26/2017] insulin (NOVOLIN-R) infusion    . [START ON 03/26/2017] milrinone    . [START ON 03/26/2017] nitroGLYCERIN    . [START ON 03/26/2017] phenylephrine 20mg /277mL NS (0.08mg /ml) infusion    . [START ON 03/26/2017] tranexamic acid (CYKLOKAPRON) infusion ([facility])    . [START ON 03/26/2017] vancomycin     PRN Meds: sodium chloride, acetaminophen, albuterol, ALPRAZolam, nitroGLYCERIN, ondansetron (ZOFRAN) IV, sodium chloride flush, temazepam   Vital Signs      Vitals:   03/24/17 1954 03/24/17 2222 03/25/17 0445 03/25/17 0828  BP: (!) 145/67  124/67 (!) 119/59  Pulse: 67 74 67 68  Resp:      Temp: 97.9 F (36.6 C)  98 F (36.7 C)   TempSrc: Oral  Oral   SpO2: 100%  100%   Weight:   142 lb 6.4 oz (64.6 kg)   Height:        Intake/Output Summary (Last 24 hours) at 03/25/2017 1024 Last data filed at 03/25/2017 0843 Gross per 24 hour  Intake 1417.89 ml  Output -  Net 1417.89 ml   Filed Weights   03/23/17 0543 03/24/17 0604 03/25/17 0445  Weight: 142 lb 3.2 oz (64.5 kg) 144 lb 1.6 oz (65.4 kg) 142 lb 6.4 oz (64.6 kg)    Telemetry    NSR  - Personally Reviewed  ECG     NSR , LBBB  - Personally Reviewed  Physical Exam   GEN: No acute distress.   Neck: No JVD Cardiac: RRR,  Soft systolic murmur  Respiratory: Clear to auscultation bilaterally. GI: Soft, nontender, non-distended  MS: No edema; No deformity. Neuro:  Nonfocal  Psych: Normal affect   Labs    Chemistry Recent Labs  Lab 03/23/17 1453 03/24/17 0235 03/25/17 0433  NA 139 138 138  K 3.8 3.7 4.1  CL 102  104 101  CO2 25 25 27   GLUCOSE 126* 110* 101*  BUN 15 12 8   CREATININE 0.55 0.47 0.53  CALCIUM 9.3 8.9 9.7  PROT 6.3* 5.9* 6.3*  ALBUMIN 3.5 3.3* 3.5  AST 351* 234* 121*  ALT 512* 460* 362*  ALKPHOS 138* 146* 165*  BILITOT 0.5 0.5 0.4  GFRNONAA >60 >60 >60  GFRAA >60 >60 >60  ANIONGAP 12 9 10      Hematology Recent Labs  Lab 03/23/17 0653 03/24/17 0235 03/25/17 0433  WBC 3.9* 4.3 5.7  RBC 3.46* 3.30* 3.44*  HGB 10.2* 9.8* 10.1*  HCT 31.2* 29.5* 30.9*  MCV 90.2 89.4 89.8  MCH 29.5 29.7 29.4  MCHC 32.7 33.2 32.7  RDW 15.0 14.8 15.1  PLT 270 245 256    Cardiac Enzymes Recent Labs  Lab 03/21/17 2253 03/24/17 1249 03/24/17 1557 03/24/17 2122  TROPONINI <0.03 <0.03 <0.03 <0.03   No results for input(s): TROPIPOC in the last 168 hours.   BNPNo results for input(s): BNP, PROBNP in the last 168 hours.   DDimer No results for input(s):  DDIMER in the last 168 hours.   Radiology    Ct Chest W Contrast  Result Date: 03/24/2017 CLINICAL DATA:  Pleural effusion, chest pain. EXAM: CT CHEST WITH CONTRAST TECHNIQUE: Multidetector CT imaging of the chest was performed during intravenous contrast administration. CONTRAST:  44mL ISOVUE-300 IOPAMIDOL (ISOVUE-300) INJECTION 61% COMPARISON:  Chest x-ray 03/23/2017.  Chest CT 11/05/2016. FINDINGS: Cardiovascular: Stent noted in the left anterior descending coronary artery. Heart is normal size. Aorta is normal caliber. No filling defects in the pulmonary arteries to suggest pulmonary emboli. Mediastinum/Nodes: No mediastinal, hilar, or axillary adenopathy. Trachea and esophagus are unremarkable. Small hiatal hernia. Lungs/Pleura: Lungs are clear. No focal airspace opacities or suspicious nodules. No effusions. Upper Abdomen: Pneumobilia noted within the liver and common bile duct, presumably from prior sphincterotomy. Musculoskeletal: Chest wall soft tissues are unremarkable. No acute bony abnormality. IMPRESSION: No evidence of pulmonary embolus. No acute cardiopulmonary disease. Lad stent noted. Pneumobilia, presumably from prior sphincterotomy. Recommend clinical correlation. Electronically Signed   By: Rolm Baptise M.D.   On: 03/24/2017 14:13   US Liver Doppler  Result Date: 03/24/2017 CLINICAL DATA:  Elevated LFTs EXAM: DUPLEX ULTRASOUND OF LIVER TECHNIQUE: Color and duplex Doppler ultrasound was performed to evaluate the hepatic in-flow and out-flow vessels. 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  IMPRESSION: Normal portal vein and  hepatic vein velocities and waveforms. Electronically Signed   By: Suzy Bouchard M.D.   On: 03/24/2017 10:48   US Abdomen Limited Ruq  Result Date: 03/24/2017 CLINICAL DATA:  Elevated liver enzymes EXAM: ULTRASOUND ABDOMEN LIMITED RIGHT UPPER QUADRANT COMPARISON:  CT 11/05/2016 FINDINGS: Gallbladder: Surgically absent Common bile duct: Diameter: Normal 3.3 mm Liver: Normal echogenicity. No biliary duct dilatation. Small amount pneumobilia in the LEFT hepatic lobe compares with prior CT. Portal vein is patent on color Doppler imaging with normal direction of blood flow towards the liver. IMPRESSION: 1. No acute findings the liver. Normal echogenicity. No biliary duct dilatation. 2. Pneumobilia presumed related to prior sphincterotomy and not changed  from comparison CT Electronically Signed   By: Suzy Bouchard M.D.   On: 03/24/2017 10:53    Cardiac Studies   Echo 03/24/17- Study Conclusions  - Left ventricle: The cavity size was normal. There was mild   concentric hypertrophy. Systolic function was normal. The   estimated ejection fraction was in the range of 55% to 60%. Wall   motion was normal; there were no regional wall motion   abnormalities. Left ventricular diastolic function parameters   were normal. - Mitral valve: There was mild regurgitation. - Atrial septum: No defect or patent foramen ovale was identified. - Pulmonary arteries: PA peak pressure: 31 mm Hg (S).   Patient Profile     57 year old female with a history of coronary artery disease-status post STEMI in September 2018 treated with LAD DES. She was admitted on 03/21/17 to [facility] with crescendo angina.  She was found to have a markedly positive Myoview study with reversible anterior and anterolateral/anteroseptal defect.  She was started on heparin drip and was transferred to [facility]. Cath done February 8 revealed ostial LAD stenosis of 80%.  Mid LAD stent is widely patent.  There is a 40%  distal left main stenosis.  First diagonal artery has an 85% stenosis. LVF and LVEDP WNL. The plan is to perform CABG once her Brilinta washes out-03/26/17.  Assessment & Plan    1.  [place] with abnormal Myoview. CAD progression at cath, Brilinta on hold.     2.  Elevated SGPT and SGOT on adm.  Abd US unremarkable, statin on hold and      LFTs improving   3.  Hypokalemia: Potassium level is better.  4. Dyslipidemia: on Pravachol prior to adm doubt she will be able to take a statin as it appears her LFT elevation was secondary to this. Consider PCSK9. ? If she is a candidate for AEGIS II.   Plan: CABG tomorrow.  For questions or updates, please contact [place] Please consult [email or web address] for contact info under Cardiology/STEMI.      Signed, Kerin Ransom, PA-C  03/25/2017, 10:24 AM    I have seen and examined the patient along with Kerin Ransom, PA-C .  I have reviewed the chart, notes and new data.  I agree with PA's note.  Key new complaints: walking briskly in hall, feels well Key examination changes: normal CV exam Key new findings / data: transaminases improving  PLAN: CABG tomorrow. LFTs normal on admission and increased after that - I am not convinced that I a statin related problem. After recovery from surgery I would re-challenge with statin (LDL was 54 on pravastatin). Switch to PCSK9 if LFTs rise again.  Sanda Klein, MD, [place] [phone] 03/25/2017, 11:33 AM

## 2017-03-25 NOTE — Progress Notes (Signed)
3 Days Post-Op Procedure(s) (LRB): LEFT HEART CATH AND CORONARY ANGIOGRAPHY (N/A) Intravascular Ultrasound/IVUS (N/A) Subjective:  stable on IV heparin Plan multivessel CABG in a.m. Procedure indications benefits and risks have been discussed with patient and she agrees.  Objective: Vital signs in last 24 hours: Temp:  [97.9 F (36.6 C)-98 F (36.7 C)] 98 F (36.7 C) (02/11 1519) Pulse Rate:  [66-74] 66 (02/11 1519) Cardiac Rhythm: Normal sinus rhythm (02/11 0820) BP: (113-145)/(59-67) 113/61 (02/11 1519) SpO2:  [100 %] 100 % (02/11 1519) Weight:  [142 lb 6.4 oz (64.6 kg)] 142 lb 6.4 oz (64.6 kg) (02/11 0445)  Hemodynamic parameters for last 24 hours:  sinus rhythm afebrile \  Intake/Output from previous day: 02/10 0701 - 02/11 0700 In: 1057.9 [P.O.:720; I.V.:337.9] Out: -  Intake/Output this shift: Total I/O In: 720 [P.O.:720] Out: 450 [Urine:450]       Exam    General- alert and comfortable    Neck- no JVD, no cervical adenopathy palpable, no carotid bruit   Lungs- clear without rales, wheezes   Cor- regular rate and rhythm, no murmur , gallop   Abdomen- soft, non-tender   Extremities - warm, non-tender, minimal edema   Neuro- oriented, appropriate, no focal weakness   Lab Results: Recent Labs    03/24/17 0235 03/25/17 0433  WBC 4.3 5.7  HGB 9.8* 10.1*  HCT 29.5* 30.9*  PLT 245 256   BMET:  Recent Labs    03/24/17 0235 03/25/17 0433  NA 138 138  K 3.7 4.1  CL 104 101  CO2 25 27  GLUCOSE 110* 101*  BUN 12 8  CREATININE 0.47 0.53  CALCIUM 8.9 9.7    PT/INR:  Recent Labs    03/24/17 0235  LABPROT 12.2  INR 0.91   ABG    Component Value Date/Time   PHART 7.433 03/25/2017 1540   HCO3 28.9 (H) 03/25/2017 1540   O2SAT 96.4 03/25/2017 1540   CBG (last 3)  No results for input(s): GLUCAP in the last 72 hours.  Assessment/Plan: S/P Procedure(s) (LRB): LEFT HEART CATH AND CORONARY ANGIOGRAPHY (N/A) Intravascular Ultrasound/IVUS (N/A) Plan  multivessel CABG for restenosis after PCI last September Plavix washout adequate   LOS: 4 days    Tharon Aquas Trigt III 03/25/2017

## 2017-03-25 NOTE — Progress Notes (Addendum)
[facility] PROGRESS NOTE  Gabrielle Page DPO:[id number] DOB: 14-Sep-1960 DOA: 03/21/2017 PCP: [name], NP  Assessment/Plan:  Elevated LFTs: Her LFT elevation so soon after a normal value approximately 2 days ago is concerning for a fairly acute event.  She has good synthetic function and that her INR is normal.  However she does have right upper quadrant pain and a history of surgical procedures.  She has been exposed to some new agents including contrast however none of her medications listed other than montelukast which has been associated with some hepatotoxicity in case reports an will hold statins for now. -Repeat hepatic panel better today, cleared for surgery, will monitor -Hold pravastatin, hold montelukast - Abdominal ultrasound with Dopplers--> neg for abns -Follow-up acute hepatitis panel, EBV, CMV - cause: passage of small stone? Vs contrast from heart cath  ##) Mild intermittent asthma: Triggers apparently appear to be allergies -Hold montelukast  ##) Chest pain: Defer further cardiac workup to cardiology    HPI/Subjective: Pt well and no complaints.  Objective: Vitals:   03/25/17 0445 03/25/17 0828  BP: 124/67 (!) 119/59  Pulse: 67 68  Resp:    Temp: 98 F (36.7 C)   SpO2: 100%     Intake/Output Summary (Last 24 hours) at 03/25/2017 1242 Last data filed at 03/25/2017 0843 Gross per 24 hour  Intake 1417.89 ml  Output -  Net 1417.89 ml   Filed Weights   03/23/17 0543 03/24/17 0604 03/25/17 0445  Weight: 64.5 kg (142 lb 3.2 oz) 65.4 kg (144 lb 1.6 oz) 64.6 kg (142 lb 6.4 oz)    Exam:   General:  NAD, NCAT  Cardiovascular: RRR, no MRG  Respiratory: CTAB, nl wob  Abdomen: NT, ND, BS+  Musculoskeletal: Moving all extr   Data Reviewed: Basic Metabolic Panel: Recent Labs  Lab 03/21/17 2253 03/23/17 0653 03/23/17 1453 03/24/17 0235 03/25/17 0433  NA 141 140 139 138 138  K 3.6 3.3* 3.8 3.7 4.1  CL 105 105 102 104 101  CO2 23  24 25 25 27   GLUCOSE 111* 102* 126* 110* 101*  BUN 19 14 15 12 8   CREATININE 0.62 0.54 0.55 0.47 0.53  CALCIUM 9.0 9.2 9.3 8.9 9.7   Liver Function Tests: Recent Labs  Lab 03/21/17 2253 03/23/17 0653 03/23/17 1453 03/24/17 0235 03/25/17 0433  AST 21 443* 351* 234* 121*  ALT 20 519* 512* 460* 362*  ALKPHOS 51 118 138* 146* 165*  BILITOT 0.3 0.6 0.5 0.5 0.4  PROT 5.9* 6.3* 6.3* 5.9* 6.3*  ALBUMIN 3.5 3.5 3.5 3.3* 3.5   No results for input(s): LIPASE, AMYLASE in the last 168 hours. No results for input(s): AMMONIA in the last 168 hours. CBC: Recent Labs  Lab 03/21/17 2253 03/22/17 0513 03/23/17 0653 03/24/17 0235 03/25/17 0433  WBC 6.4 5.3 3.9* 4.3 5.7  NEUTROABS 4.1  --   --   --   --   HGB 9.7* 10.1* 10.2* 9.8* 10.1*  HCT 29.8* 31.1* 31.2* 29.5* 30.9*  MCV 90.0 90.4 90.2 89.4 89.8  PLT 268 270 270 245 256   Cardiac Enzymes: Recent Labs  Lab 03/21/17 2253 03/24/17 1249 03/24/17 1557 03/24/17 2122  TROPONINI <0.03 <0.03 <0.03 <0.03   BNP (last 3 results) No results for input(s): BNP in the last 8760 hours.  ProBNP (last 3 results) No results for input(s): PROBNP in the last 8760 hours.  CBG: No results for input(s): GLUCAP in the last 168 hours.  No results found for this  or any previous visit (from the past 240 hour(s)).   Studies: Ct Chest W Contrast  Result Date: 03/24/2017 CLINICAL DATA:  Pleural effusion, chest pain. EXAM: CT CHEST WITH CONTRAST TECHNIQUE: Multidetector CT imaging of the chest was performed during intravenous contrast administration. CONTRAST:  75mL ISOVUE-300 IOPAMIDOL (ISOVUE-300) INJECTION 61% COMPARISON:  Chest x-ray 03/23/2017.  Chest CT 11/05/2016. FINDINGS: Cardiovascular: Stent noted in the left anterior descending coronary artery. Heart is normal size. Aorta is normal caliber. No filling defects in the pulmonary arteries to suggest pulmonary emboli. Mediastinum/Nodes: No mediastinal, hilar, or axillary adenopathy. Trachea and  esophagus are unremarkable. Small hiatal hernia. Lungs/Pleura: Lungs are clear. No focal airspace opacities or suspicious nodules. No effusions. Upper Abdomen: Pneumobilia noted within the liver and common bile duct, presumably from prior sphincterotomy. Musculoskeletal: Chest wall soft tissues are unremarkable. No acute bony abnormality. IMPRESSION: No evidence of pulmonary embolus. No acute cardiopulmonary disease. Lad stent noted. Pneumobilia, presumably from prior sphincterotomy. Recommend clinical correlation. Electronically Signed   By: Rolm Baptise M.D.   On: 03/24/2017 14:13   US Liver Doppler  Result Date: 03/24/2017 CLINICAL DATA:  Elevated LFTs EXAM: DUPLEX ULTRASOUND OF LIVER TECHNIQUE: Color and duplex Doppler ultrasound was performed to evaluate the hepatic in-flow and out-flow vessels. 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 IMPRESSION: Normal portal vein and  hepatic vein velocities and waveforms. Electronically Signed   By: Suzy Bouchard M.D.   On: 03/24/2017 10:48   US Abdomen Limited Ruq  Result Date: 03/24/2017 CLINICAL DATA:  Elevated liver enzymes EXAM: ULTRASOUND ABDOMEN LIMITED RIGHT UPPER QUADRANT COMPARISON:  CT 11/05/2016 FINDINGS: Gallbladder: Surgically absent Common bile duct: Diameter: Normal 3.3 mm Liver: Normal echogenicity. No biliary duct dilatation. Small amount pneumobilia in the LEFT hepatic lobe compares with prior CT. Portal vein is patent on color Doppler imaging with normal direction of blood flow towards the liver. IMPRESSION: 1. No acute findings the liver. Normal  echogenicity. No biliary duct dilatation. 2. Pneumobilia presumed related to prior sphincterotomy and not changed from comparison CT Electronically Signed   By: Suzy Bouchard M.D.   On: 03/24/2017 10:53    Scheduled Meds: . aspirin EC  81 mg Oral Daily  . chlorhexidine  60 mL Topical Once   And  . [START ON 03/26/2017] chlorhexidine  60 mL Topical Once  . [START ON 03/26/2017] chlorhexidine  15 mL Mouth/Throat Once  . [START ON 03/26/2017] diazepam  2 mg Oral Once  . [START ON 03/26/2017] heparin-papaverine-plasmalyte irrigation   Irrigation To OR  . [START ON 03/26/2017] magnesium sulfate  40 mEq Other To OR  . [START ON 03/26/2017] metoprolol tartrate  12.5 mg Oral Once  . metoprolol tartrate  50 mg Oral BID  . multivitamin with minerals  1 tablet Oral Daily  . [START ON 03/26/2017] potassium chloride  80 mEq Other To OR  . potassium chloride  20 mEq Oral BID  . sodium chloride flush  3 mL Intravenous Q12H  . [START ON 03/26/2017] tranexamic acid  15 mg/kg Intravenous To OR  . [START ON 03/26/2017] tranexamic acid  2 mg/kg Intracatheter To  OR   Continuous Infusions: . sodium chloride    . [START ON 03/26/2017] cefUROXime (ZINACEF)  IV    . [START ON 03/26/2017] cefUROXime (ZINACEF)  IV    . [START ON 03/26/2017] dexmedetomidine    . [START ON 03/26/2017] DOPamine    . [START ON 03/26/2017] epinephrine    . [START ON 03/26/2017] heparin 30,000 units/NS 1000 mL solution for CELLSAVER    . heparin 950 Units/hr (03/25/17 0444)  . [START ON 03/26/2017] insulin (NOVOLIN-R) infusion    . [START ON 03/26/2017] milrinone    . [START ON 03/26/2017] nitroGLYCERIN    . [START ON 03/26/2017] phenylephrine 20mg /259mL NS (0.08mg /ml) infusion    . [START ON 03/26/2017] tranexamic acid (CYKLOKAPRON) infusion ([facility])    . [START ON 03/26/2017] vancomycin      Active Problems:   Unstable angina (HCC)   Elevated LFTs    Time spent: [facility] Pager AMION. If 7PM-7AM, please  contact night-coverage at [email or web address], password [facility] 03/25/2017, 12:42 PM  LOS: 4 days

## 2017-03-25 NOTE — Progress Notes (Signed)
ANTICOAGULATION CONSULT NOTE - Follow Up Consult  Pharmacy Consult for heparin Indication: chest pain/ACS - CABG planned Tues 2/12  Allergies  Allergen Reactions  . Compazine [Prochlorperazine Edisylate] Other (See Comments)    siezure  . Other Other (See Comments)    Steroids cause uncontrollable coughing    Patient Measurements: Height: 5\' 3"  (160 cm) Weight: 142 lb 6.4 oz (64.6 kg) IBW/kg (Calculated) : 52.4 Heparin Dosing Weight: 64.4 kg  Vital Signs: Temp: 98 F (36.7 C) (02/11 0445) Temp Source: Oral (02/11 0445) BP: 124/67 (02/11 0445) Pulse Rate: 67 (02/11 0445)  Labs: Recent Labs    03/23/17 0653 03/23/17 1453 03/24/17 0235 03/24/17 1249 03/24/17 1557 03/24/17 2122 03/25/17 0433  HGB 10.2*  --  9.8*  --   --   --  10.1*  HCT 31.2*  --  29.5*  --   --   --  30.9*  PLT 270  --  245  --   --   --  256  LABPROT 12.4  --  12.2  --   --   --   --   INR 0.93  --  0.91  --   --   --   --   HEPARINUNFRC 0.31  --  0.36  --   --   --  0.36  CREATININE 0.54 0.55 0.47  --   --   --  0.53  TROPONINI  --   --   --  <0.03 <0.03 <0.03  --     Estimated Creatinine Clearance: 72.7 mL/min (by C-G formula based on SCr of 0.53 mg/dL).   Medications:  Scheduled:  . aspirin EC  81 mg Oral Daily  . bisacodyl  5 mg Oral Once  . chlorhexidine  60 mL Topical Once   And  . [START ON 03/26/2017] chlorhexidine  60 mL Topical Once  . [START ON 03/26/2017] chlorhexidine  15 mL Mouth/Throat Once  . [START ON 03/26/2017] diazepam  2 mg Oral Once  . Influenza vac split quadrivalent PF  0.5 mL Intramuscular Tomorrow-1000  . [START ON 03/26/2017] metoprolol tartrate  12.5 mg Oral Once  . metoprolol tartrate  50 mg Oral BID  . multivitamin with minerals  1 tablet Oral Daily  . pneumococcal 23 valent vaccine  0.5 mL Intramuscular Tomorrow-1000  . potassium chloride  20 mEq Oral BID  . sodium chloride flush  3 mL Intravenous Q12H   Infusions:  . sodium chloride    . heparin 950  Units/hr (03/25/17 0444)    Assessment: 57 y/o female with hx of STEMI (DES 10/2016) on heparin s/p cath revealing progression of stenosis of LAD. Heparin was restarted post-cath. Brilinta is currently on hold in preparation for CABG. Of note patient is currently undergoing liver workup for acutely elevated AST/ALT.   Heparin level unchanged and at goal today. CBC is stable overnight, no bleeding noted.  Goal of Therapy:  Heparin level 0.3-0.7 units/ml Monitor platelets by anticoagulation protocol: Yes   Plan:  Continue heparin to 950 units/hr Daily heparin level, CBC Monitor s/sx of bleeding CABG planned for Tues 2/12 - after Brilinta washout (pending liver workup)  Erin Hearing PharmD., BCPS Clinical Pharmacist 03/25/2017 8:27 AM

## 2017-03-25 NOTE — Progress Notes (Signed)
CARDIAC REHAB PHASE I   Holding amb due to CP with activity. Discussed sternal precautions, IS, mobility, and d/c planning. Pt voices understanding, is quite anxious today. Able to inspire 1500 mL on IS. Encouraged practice today. Her husband works so I encouraged her to arrange supervision at home for d/c. Gave her [facility] booklet, careguide, Moving in the Tube info sheet, and preop video to watch. Alexander, ACSM 03/25/2017 9:37 AM

## 2017-03-26 ENCOUNTER — Inpatient Hospital Stay (HOSPITAL_COMMUNITY): Payer: Self-pay | Admitting: Certified Registered"

## 2017-03-26 ENCOUNTER — Inpatient Hospital Stay (HOSPITAL_COMMUNITY)
Admission: AD | Disposition: A | Discharge status: Home / Self Care | Payer: Self-pay | Source: Other Acute Inpatient Hospital | Attending: Cardiothoracic Surgery

## 2017-03-26 ENCOUNTER — Inpatient Hospital Stay (HOSPITAL_COMMUNITY): Payer: Self-pay

## 2017-03-26 ENCOUNTER — Encounter (HOSPITAL_COMMUNITY): Payer: Self-pay | Admitting: Certified Registered"

## 2017-03-26 DIAGNOSIS — R079 Chest pain, unspecified: Secondary | ICD-10-CM

## 2017-03-26 DIAGNOSIS — Z951 Presence of aortocoronary bypass graft: Secondary | ICD-10-CM

## 2017-03-26 HISTORY — PX: CORONARY ARTERY BYPASS GRAFT: SHX141

## 2017-03-26 HISTORY — DX: Presence of aortocoronary bypass graft: Z95.1

## 2017-03-26 HISTORY — PX: TEE WITHOUT CARDIOVERSION: SHX5443

## 2017-03-26 LAB — MAGNESIUM: Magnesium: 2.6 mg/dL — ABNORMAL HIGH (ref 1.7–2.4)

## 2017-03-26 LAB — CBC
HCT: 30.7 % — ABNORMAL LOW (ref 36.0–46.0)
HCT: 27.7 % — ABNORMAL LOW (ref 36.0–46.0)
HCT: 26.3 % — ABNORMAL LOW (ref 36.0–46.0)
HEMOGLOBIN: 9.3 g/dL — AB (ref 12.0–15.0)
Hemoglobin: 10.1 g/dL — ABNORMAL LOW (ref 12.0–15.0)
Hemoglobin: 8.8 g/dL — ABNORMAL LOW (ref 12.0–15.0)
MCH: 29.7 pg (ref 26.0–34.0)
MCH: 30 pg (ref 26.0–34.0)
MCH: 29.5 pg (ref 26.0–34.0)
MCHC: 32.9 g/dL (ref 30.0–36.0)
MCHC: 33.6 g/dL (ref 30.0–36.0)
MCHC: 33.5 g/dL (ref 30.0–36.0)
MCV: 90.3 fL (ref 78.0–100.0)
MCV: 89.4 fL (ref 78.0–100.0)
MCV: 88.3 fL (ref 78.0–100.0)
PLATELETS: 129 10*3/uL — AB (ref 150–400)
Platelets: 230 10*3/uL (ref 150–400)
Platelets: 171 10*3/uL (ref 150–400)
RBC: 3.4 MIL/uL — ABNORMAL LOW (ref 3.87–5.11)
RBC: 3.1 MIL/uL — AB (ref 3.87–5.11)
RBC: 2.98 MIL/uL — ABNORMAL LOW (ref 3.87–5.11)
RDW: 15.3 % (ref 11.5–15.5)
RDW: 14.7 % (ref 11.5–15.5)
RDW: 15 % (ref 11.5–15.5)
WBC: 6.3 10*3/uL (ref 4.0–10.5)
WBC: 7.9 10*3/uL (ref 4.0–10.5)
WBC: 9.3 10*3/uL (ref 4.0–10.5)

## 2017-03-26 LAB — CREATININE, SERUM
Creatinine, Ser: 0.58 mg/dL (ref 0.44–1.00)
GFR calc Af Amer: >60 mL/min (ref >60)
GFR calc non Af Amer: >60 mL/min (ref >60)

## 2017-03-26 LAB — POCT I-STAT, CHEM 8
BUN: 11 mg/dL (ref 6–20)
BUN: 10 mg/dL (ref 6–20)
BUN: 11 mg/dL (ref 6–20)
BUN: 11 mg/dL (ref 6–20)
BUN: 10 mg/dL (ref 6–20)
BUN: 9 mg/dL (ref 6–20)
CALCIUM ION: 1.27 mmol/L (ref 1.15–1.40)
CALCIUM ION: 0.98 mmol/L — AB (ref 1.15–1.40)
CALCIUM ION: 1.24 mmol/L (ref 1.15–1.40)
CALCIUM ION: 1.03 mmol/L — AB (ref 1.15–1.40)
CALCIUM ION: 1.29 mmol/L (ref 1.15–1.40)
CHLORIDE: 98 mmol/L — AB (ref 101–111)
CHLORIDE: 96 mmol/L — AB (ref 101–111)
CREATININE: 0.4 mg/dL — AB (ref 0.44–1.00)
CREATININE: 0.4 mg/dL — AB (ref 0.44–1.00)
CREATININE: 0.5 mg/dL (ref 0.44–1.00)
Calcium, Ion: 1.26 mmol/L (ref 1.15–1.40)
Chloride: 100 mmol/L — ABNORMAL LOW (ref 101–111)
Chloride: 100 mmol/L — ABNORMAL LOW (ref 101–111)
Chloride: 97 mmol/L — ABNORMAL LOW (ref 101–111)
Chloride: 102 mmol/L (ref 101–111)
Creatinine, Ser: 0.4 mg/dL — ABNORMAL LOW (ref 0.44–1.00)
Creatinine, Ser: 0.4 mg/dL — ABNORMAL LOW (ref 0.44–1.00)
Creatinine, Ser: 0.3 mg/dL — ABNORMAL LOW (ref 0.44–1.00)
GLUCOSE: 104 mg/dL — AB (ref 65–99)
GLUCOSE: 109 mg/dL — AB (ref 65–99)
GLUCOSE: 111 mg/dL — AB (ref 65–99)
GLUCOSE: 136 mg/dL — AB (ref 65–99)
Glucose, Bld: 91 mg/dL (ref 65–99)
Glucose, Bld: 119 mg/dL — ABNORMAL HIGH (ref 65–99)
HCT: 26 % — ABNORMAL LOW (ref 36.0–46.0)
HCT: 20 % — ABNORMAL LOW (ref 36.0–46.0)
HCT: 26 % — ABNORMAL LOW (ref 36.0–46.0)
HCT: 26 % — ABNORMAL LOW (ref 36.0–46.0)
HCT: 22 % — ABNORMAL LOW (ref 36.0–46.0)
HEMATOCRIT: 26 % — AB (ref 36.0–46.0)
HEMOGLOBIN: 8.8 g/dL — AB (ref 12.0–15.0)
HEMOGLOBIN: 8.8 g/dL — AB (ref 12.0–15.0)
HEMOGLOBIN: 7.5 g/dL — AB (ref 12.0–15.0)
Hemoglobin: 8.8 g/dL — ABNORMAL LOW (ref 12.0–15.0)
Hemoglobin: 6.8 g/dL — CL (ref 12.0–15.0)
Hemoglobin: 8.8 g/dL — ABNORMAL LOW (ref 12.0–15.0)
POTASSIUM: 4.2 mmol/L (ref 3.5–5.1)
Potassium: 4 mmol/L (ref 3.5–5.1)
Potassium: 4.2 mmol/L (ref 3.5–5.1)
Potassium: 4.5 mmol/L (ref 3.5–5.1)
Potassium: 6 mmol/L — ABNORMAL HIGH (ref 3.5–5.1)
Potassium: 5.1 mmol/L (ref 3.5–5.1)
SODIUM: 137 mmol/L (ref 135–145)
SODIUM: 137 mmol/L (ref 135–145)
Sodium: 137 mmol/L (ref 135–145)
Sodium: 137 mmol/L (ref 135–145)
Sodium: 138 mmol/L (ref 135–145)
Sodium: 133 mmol/L — ABNORMAL LOW (ref 135–145)
TCO2: 30 mmol/L (ref 22–32)
TCO2: 28 mmol/L (ref 22–32)
TCO2: 28 mmol/L (ref 22–32)
TCO2: 32 mmol/L (ref 22–32)
TCO2: 29 mmol/L (ref 22–32)
TCO2: 26 mmol/L (ref 22–32)

## 2017-03-26 LAB — PROTIME-INR
INR: 1.15
PROTHROMBIN TIME: 14.6 s (ref 11.4–15.2)

## 2017-03-26 LAB — POCT I-STAT 3, ART BLOOD GAS (G3+)
Acid-Base Excess: 7 mmol/L — ABNORMAL HIGH (ref 0.0–2.0)
Acid-Base Excess: 1 mmol/L (ref 0.0–2.0)
BICARBONATE: 24.6 mmol/L (ref 20.0–28.0)
Bicarbonate: 31 mmol/L — ABNORMAL HIGH (ref 20.0–28.0)
Bicarbonate: 24.7 mmol/L (ref 20.0–28.0)
O2 SAT: 100 %
O2 SAT: 98 %
O2 Saturation: 100 %
PCO2 ART: 35.3 mmHg (ref 32.0–48.0)
PCO2 ART: 36.9 mmHg (ref 32.0–48.0)
PH ART: 7.492 — AB (ref 7.350–7.450)
PH ART: 7.453 — AB (ref 7.350–7.450)
PO2 ART: 103 mmHg (ref 83.0–108.0)
Patient temperature: 35.8
TCO2: 32 mmol/L (ref 22–32)
TCO2: 26 mmol/L (ref 22–32)
TCO2: 26 mmol/L (ref 22–32)
pCO2 arterial: 40.5 mmHg (ref 32.0–48.0)
pH, Arterial: 7.427 (ref 7.350–7.450)
pO2, Arterial: 352 mmHg — ABNORMAL HIGH (ref 83.0–108.0)
pO2, Arterial: 239 mmHg — ABNORMAL HIGH (ref 83.0–108.0)

## 2017-03-26 LAB — POCT I-STAT 4, (NA,K, GLUC, HGB,HCT)
Glucose, Bld: 126 mg/dL — ABNORMAL HIGH (ref 65–99)
HCT: 28 % — ABNORMAL LOW (ref 36.0–46.0)
Hemoglobin: 9.5 g/dL — ABNORMAL LOW (ref 12.0–15.0)
Potassium: 4 mmol/L (ref 3.5–5.1)
SODIUM: 140 mmol/L (ref 135–145)

## 2017-03-26 LAB — COMPREHENSIVE METABOLIC PANEL
ALBUMIN: 3.5 g/dL (ref 3.5–5.0)
ALT: 282 U/L — ABNORMAL HIGH (ref 14–54)
ANION GAP: 11 (ref 5–15)
AST: 72 U/L — ABNORMAL HIGH (ref 15–41)
Alkaline Phosphatase: 153 U/L — ABNORMAL HIGH (ref 38–126)
BILIRUBIN TOTAL: 0.3 mg/dL (ref 0.3–1.2)
BUN: 12 mg/dL (ref 6–20)
CALCIUM: 9.7 mg/dL (ref 8.9–10.3)
CO2: 27 mmol/L (ref 22–32)
Chloride: 99 mmol/L — ABNORMAL LOW (ref 101–111)
Creatinine, Ser: 0.63 mg/dL (ref 0.44–1.00)
Glucose, Bld: 108 mg/dL — ABNORMAL HIGH (ref 65–99)
Potassium: 4.1 mmol/L (ref 3.5–5.1)
Sodium: 137 mmol/L (ref 135–145)
TOTAL PROTEIN: 6.5 g/dL (ref 6.5–8.1)

## 2017-03-26 LAB — GLUCOSE, CAPILLARY
GLUCOSE-CAPILLARY: 120 mg/dL — AB (ref 65–99)
GLUCOSE-CAPILLARY: 128 mg/dL — AB (ref 65–99)
GLUCOSE-CAPILLARY: 125 mg/dL — AB (ref 65–99)
GLUCOSE-CAPILLARY: 159 mg/dL — AB (ref 65–99)
GLUCOSE-CAPILLARY: 124 mg/dL — AB (ref 65–99)
Glucose-Capillary: 134 mg/dL — ABNORMAL HIGH (ref 65–99)
Glucose-Capillary: 122 mg/dL — ABNORMAL HIGH (ref 65–99)
Glucose-Capillary: 125 mg/dL — ABNORMAL HIGH (ref 65–99)
Glucose-Capillary: 147 mg/dL — ABNORMAL HIGH (ref 65–99)

## 2017-03-26 LAB — RSV(RESPIRATORY SYNCYTIAL VIRUS) AB, BLOOD: RSV Ab: NEGATIVE

## 2017-03-26 LAB — APTT: aPTT: 31 seconds (ref 24–36)

## 2017-03-26 LAB — HEMOGLOBIN AND HEMATOCRIT, BLOOD
HCT: 26.1 % — ABNORMAL LOW (ref 36.0–46.0)
Hemoglobin: 8.9 g/dL — ABNORMAL LOW (ref 12.0–15.0)

## 2017-03-26 LAB — CMV DNA, QUANTITATIVE, PCR: Log10 CMV Qn DNA Pl: UNDETERMINED log10 IU/mL

## 2017-03-26 LAB — HEPARIN LEVEL (UNFRACTIONATED): Heparin Unfractionated: 0.31 IU/mL (ref 0.30–0.70)

## 2017-03-26 LAB — PLATELET COUNT: Platelets: 145 10*3/uL — ABNORMAL LOW (ref 150–400)

## 2017-03-26 LAB — CYTOMEGALOVIRUS DNA, QUANTITATIVE REAL-TIME PCR, PLASMA: CMV DNA Quant: NEGATIVE [IU]/mL

## 2017-03-26 LAB — PREPARE RBC (CROSSMATCH)

## 2017-03-26 SURGERY — CORONARY ARTERY BYPASS GRAFTING (CABG)
Anesthesia: General | Site: Chest

## 2017-03-26 MED ORDER — SODIUM CHLORIDE 0.9 % IV SOLN: Freq: Once | INTRAVENOUS | Status: AC

## 2017-03-26 MED ORDER — HEMOSTATIC AGENTS (NO CHARGE) OPTIME
TOPICAL | Status: DC | PRN
  Administered 2017-03-26 (×3): 1 via TOPICAL

## 2017-03-26 MED ORDER — SUFENTANIL CITRATE 250 MCG/5ML IV SOLN
INTRAVENOUS | Status: AC
  Filled 2017-03-26: qty 5

## 2017-03-26 MED ORDER — METOPROLOL TARTRATE 12.5 MG HALF TABLET
12.5000 mg | ORAL_TABLET | Freq: Two times a day (BID) | ORAL | Status: DC
  Administered 2017-03-27 – 2017-03-29 (×5): 12.5 mg via ORAL
  Filled 2017-03-26 (×5): qty 1

## 2017-03-26 MED ORDER — MIDAZOLAM HCL 2 MG/2ML IJ SOLN
INTRAMUSCULAR | Status: AC
  Administered 2017-03-26: 2 mg via INTRAVENOUS
  Filled 2017-03-26: qty 2

## 2017-03-26 MED ORDER — ROCURONIUM BROMIDE 100 MG/10ML IV SOLN
INTRAVENOUS | Status: DC | PRN
  Administered 2017-03-26: 30 mg via INTRAVENOUS

## 2017-03-26 MED ORDER — SODIUM CHLORIDE 0.9 % IV SOLN
INTRAVENOUS | Status: DC | PRN
  Administered 2017-03-26: 750 mg via INTRAVENOUS

## 2017-03-26 MED ORDER — METOPROLOL TARTRATE 25 MG/10 ML ORAL SUSPENSION: 12.5000 mg | Freq: Two times a day (BID) | ORAL | Status: DC

## 2017-03-26 MED ORDER — CALCIUM CHLORIDE 10 % IV SOLN
INTRAVENOUS | Status: AC
  Filled 2017-03-26: qty 10

## 2017-03-26 MED ORDER — HYDRALAZINE HCL 20 MG/ML IJ SOLN
10.0000 mg | INTRAMUSCULAR | Status: DC | PRN
  Administered 2017-03-26: 10 mg via INTRAVENOUS
  Filled 2017-03-26: qty 1

## 2017-03-26 MED ORDER — CHLORHEXIDINE GLUCONATE CLOTH 2 % EX PADS
6.0000 | MEDICATED_PAD | Freq: Every day | CUTANEOUS | Status: DC
  Administered 2017-03-26 – 2017-03-28 (×3): 6 via TOPICAL

## 2017-03-26 MED ORDER — VECURONIUM BROMIDE 10 MG IV SOLR
INTRAVENOUS | Status: AC
  Filled 2017-03-26: qty 20

## 2017-03-26 MED ORDER — GELATIN ABSORBABLE MT POWD
OROMUCOSAL | Status: DC | PRN
  Administered 2017-03-26 (×3): 4 mL via TOPICAL

## 2017-03-26 MED ORDER — PROPOFOL 10 MG/ML IV BOLUS
INTRAVENOUS | Status: AC
  Filled 2017-03-26: qty 20

## 2017-03-26 MED ORDER — ALBUMIN HUMAN 5 % IV SOLN
INTRAVENOUS | Status: DC | PRN
  Administered 2017-03-26: 13:00:00 via INTRAVENOUS

## 2017-03-26 MED ORDER — SODIUM CHLORIDE 0.9 % IV SOLN
1.5000 g | Freq: Two times a day (BID) | INTRAVENOUS | Status: AC
  Administered 2017-03-26 – 2017-03-28 (×4): 1.5 g via INTRAVENOUS
  Filled 2017-03-26 (×4): qty 1.5

## 2017-03-26 MED ORDER — LACTATED RINGERS IV SOLN: INTRAVENOUS | Status: DC

## 2017-03-26 MED ORDER — MORPHINE SULFATE (PF) 4 MG/ML IV SOLN
2.0000 mg | INTRAVENOUS | Status: DC | PRN
  Administered 2017-03-26: 2 mg via INTRAVENOUS
  Administered 2017-03-26 – 2017-03-27 (×5): 4 mg via INTRAVENOUS
  Filled 2017-03-26 (×5): qty 1

## 2017-03-26 MED ORDER — MIDAZOLAM HCL 2 MG/2ML IJ SOLN
2.0000 mg | INTRAMUSCULAR | Status: DC | PRN
  Administered 2017-03-26: 2 mg via INTRAVENOUS

## 2017-03-26 MED ORDER — VANCOMYCIN HCL IN DEXTROSE 1-5 GM/200ML-% IV SOLN
1000.0000 mg | Freq: Two times a day (BID) | INTRAVENOUS | Status: AC
  Administered 2017-03-26 – 2017-03-27 (×2): 1000 mg via INTRAVENOUS
  Filled 2017-03-26 (×2): qty 200

## 2017-03-26 MED ORDER — INSULIN REGULAR BOLUS VIA INFUSION
0.0000 [IU] | Freq: Three times a day (TID) | INTRAVENOUS | Status: DC
  Filled 2017-03-26: qty 10

## 2017-03-26 MED ORDER — SODIUM CHLORIDE 0.9% FLUSH: 3.0000 mL | INTRAVENOUS | Status: DC | PRN

## 2017-03-26 MED ORDER — HEPARIN SODIUM (PORCINE) 1000 UNIT/ML IJ SOLN
INTRAMUSCULAR | Status: AC
  Filled 2017-03-26: qty 2

## 2017-03-26 MED ORDER — SUFENTANIL CITRATE 50 MCG/ML IV SOLN
INTRAVENOUS | Status: DC | PRN
  Administered 2017-03-26: 30 ug via INTRAVENOUS
  Administered 2017-03-26 (×2): 20 ug via INTRAVENOUS
  Administered 2017-03-26: 5 ug via INTRAVENOUS
  Administered 2017-03-26: 20 ug via INTRAVENOUS
  Administered 2017-03-26: 5 ug via INTRAVENOUS
  Administered 2017-03-26: 20 ug via INTRAVENOUS

## 2017-03-26 MED ORDER — MORPHINE SULFATE (PF) 4 MG/ML IV SOLN
1.0000 mg | INTRAVENOUS | Status: DC | PRN
  Administered 2017-03-26: 1 mg via INTRAVENOUS
  Administered 2017-03-26 (×2): 2 mg via INTRAVENOUS
  Administered 2017-03-26 – 2017-03-27 (×2): 4 mg via INTRAVENOUS
  Filled 2017-03-26 (×5): qty 1

## 2017-03-26 MED ORDER — ORAL CARE MOUTH RINSE
15.0000 mL | OROMUCOSAL | Status: DC
  Administered 2017-03-26 (×2): 15 mL via OROMUCOSAL

## 2017-03-26 MED ORDER — PANTOPRAZOLE SODIUM 40 MG PO TBEC
40.0000 mg | DELAYED_RELEASE_TABLET | Freq: Every day | ORAL | Status: DC
  Administered 2017-03-28 – 2017-03-31 (×4): 40 mg via ORAL
  Filled 2017-03-26 (×4): qty 1

## 2017-03-26 MED ORDER — DOCUSATE SODIUM 100 MG PO CAPS
200.0000 mg | ORAL_CAPSULE | Freq: Every day | ORAL | Status: DC
  Administered 2017-03-27 – 2017-03-31 (×4): 200 mg via ORAL
  Filled 2017-03-26 (×5): qty 2

## 2017-03-26 MED ORDER — VANCOMYCIN HCL IN DEXTROSE 1-5 GM/200ML-% IV SOLN
1000.0000 mg | Freq: Once | INTRAVENOUS | Status: DC
  Filled 2017-03-26: qty 200

## 2017-03-26 MED ORDER — POTASSIUM CHLORIDE 10 MEQ/50ML IV SOLN: 10.0000 meq | INTRAVENOUS | Status: AC

## 2017-03-26 MED ORDER — PHENYLEPHRINE HCL 10 MG/ML IJ SOLN: 0.0000 ug/min | INTRAMUSCULAR | Status: DC

## 2017-03-26 MED ORDER — CALCIUM CHLORIDE 10 % IV SOLN
INTRAVENOUS | Status: DC | PRN
  Administered 2017-03-26: 200 mg via INTRAVENOUS
  Administered 2017-03-26: 100 mg via INTRAVENOUS
  Administered 2017-03-26 (×3): 200 mg via INTRAVENOUS

## 2017-03-26 MED ORDER — SODIUM CHLORIDE 0.9 % IV SOLN
20.0000 ug | Freq: Once | INTRAVENOUS | Status: DC
  Filled 2017-03-26: qty 5

## 2017-03-26 MED ORDER — SODIUM CHLORIDE 0.9% FLUSH: 10.0000 mL | INTRAVENOUS | Status: DC | PRN

## 2017-03-26 MED ORDER — ASPIRIN 81 MG PO CHEW: 324.0000 mg | CHEWABLE_TABLET | Freq: Every day | ORAL | Status: DC

## 2017-03-26 MED ORDER — METOPROLOL TARTRATE 5 MG/5ML IV SOLN: 2.5000 mg | INTRAVENOUS | Status: DC | PRN

## 2017-03-26 MED ORDER — ARTIFICIAL TEARS OPHTHALMIC OINT
TOPICAL_OINTMENT | OPHTHALMIC | Status: DC | PRN
  Administered 2017-03-26: 1 via OPHTHALMIC

## 2017-03-26 MED ORDER — ARTIFICIAL TEARS OPHTHALMIC OINT
TOPICAL_OINTMENT | OPHTHALMIC | Status: AC
  Filled 2017-03-26: qty 3.5

## 2017-03-26 MED ORDER — LACTATED RINGERS IV SOLN
INTRAVENOUS | Status: DC | PRN
  Administered 2017-03-26: 07:00:00 via INTRAVENOUS

## 2017-03-26 MED ORDER — LACTATED RINGERS IV SOLN
INTRAVENOUS | Status: DC
  Administered 2017-03-26: 20:00:00 via INTRAVENOUS

## 2017-03-26 MED ORDER — SODIUM CHLORIDE 0.9 % IV SOLN: 250.0000 mL | INTRAVENOUS | Status: DC

## 2017-03-26 MED ORDER — MORPHINE SULFATE (PF) 4 MG/ML IV SOLN
INTRAVENOUS | Status: AC
  Administered 2017-03-26: 4 mg via INTRAVENOUS
  Filled 2017-03-26: qty 1

## 2017-03-26 MED ORDER — SODIUM CHLORIDE 0.9% FLUSH
3.0000 mL | Freq: Two times a day (BID) | INTRAVENOUS | Status: DC
  Administered 2017-03-27 – 2017-03-28 (×3): 3 mL via INTRAVENOUS

## 2017-03-26 MED ORDER — CHLORHEXIDINE GLUCONATE 0.12 % MT SOLN
15.0000 mL | OROMUCOSAL | Status: AC
  Administered 2017-03-26: 15 mL via OROMUCOSAL

## 2017-03-26 MED ORDER — CHLORHEXIDINE GLUCONATE CLOTH 2 % EX PADS: 6.0000 | MEDICATED_PAD | Freq: Every day | CUTANEOUS | Status: DC

## 2017-03-26 MED ORDER — NITROGLYCERIN IN D5W 200-5 MCG/ML-% IV SOLN: 0.0000 ug/min | INTRAVENOUS | Status: DC

## 2017-03-26 MED ORDER — PROPOFOL 10 MG/ML IV BOLUS
INTRAVENOUS | Status: DC | PRN
  Administered 2017-03-26: 80 mg via INTRAVENOUS

## 2017-03-26 MED ORDER — HEPARIN SODIUM (PORCINE) 1000 UNIT/ML IJ SOLN
INTRAMUSCULAR | Status: DC | PRN
  Administered 2017-03-26: 18000 [IU] via INTRAVENOUS
  Administered 2017-03-26: 2000 [IU] via INTRAVENOUS

## 2017-03-26 MED ORDER — ONDANSETRON HCL 4 MG/2ML IJ SOLN: 4.0000 mg | Freq: Four times a day (QID) | INTRAMUSCULAR | Status: DC | PRN

## 2017-03-26 MED ORDER — SODIUM CHLORIDE 0.9 % IV SOLN: INTRAVENOUS | Status: DC

## 2017-03-26 MED ORDER — MAGNESIUM SULFATE 4 GM/100ML IV SOLN
4.0000 g | Freq: Once | INTRAVENOUS | Status: AC
  Administered 2017-03-26: 4 g via INTRAVENOUS
  Filled 2017-03-26: qty 100

## 2017-03-26 MED ORDER — CHLORHEXIDINE GLUCONATE 0.12% ORAL RINSE (MEDLINE KIT): 15.0000 mL | Freq: Two times a day (BID) | OROMUCOSAL | Status: DC

## 2017-03-26 MED ORDER — OXYCODONE HCL 5 MG PO TABS
5.0000 mg | ORAL_TABLET | ORAL | Status: DC | PRN
  Administered 2017-03-26 – 2017-03-27 (×5): 10 mg via ORAL
  Administered 2017-03-27: 5 mg via ORAL
  Administered 2017-03-27 – 2017-03-28 (×3): 10 mg via ORAL
  Filled 2017-03-26 (×2): qty 2
  Filled 2017-03-26: qty 1
  Filled 2017-03-26 (×6): qty 2

## 2017-03-26 MED ORDER — VECURONIUM BROMIDE 10 MG IV SOLR
INTRAVENOUS | Status: DC | PRN
  Administered 2017-03-26 (×4): 2 mg via INTRAVENOUS
  Administered 2017-03-26 (×2): 1 mg via INTRAVENOUS
  Administered 2017-03-26: 8 mg via INTRAVENOUS
  Administered 2017-03-26: 2 mg via INTRAVENOUS

## 2017-03-26 MED ORDER — BISACODYL 5 MG PO TBEC
10.0000 mg | DELAYED_RELEASE_TABLET | Freq: Every day | ORAL | Status: DC
  Administered 2017-03-27 – 2017-03-31 (×4): 10 mg via ORAL
  Filled 2017-03-26 (×5): qty 2

## 2017-03-26 MED ORDER — SODIUM CHLORIDE 0.45 % IV SOLN
INTRAVENOUS | Status: DC | PRN
  Administered 2017-03-26: 14:00:00 via INTRAVENOUS

## 2017-03-26 MED ORDER — BISACODYL 10 MG RE SUPP
10.0000 mg | Freq: Every day | RECTAL | Status: DC
  Filled 2017-03-26: qty 1

## 2017-03-26 MED ORDER — PHENYLEPHRINE 40 MCG/ML (10ML) SYRINGE FOR IV PUSH (FOR BLOOD PRESSURE SUPPORT)
PREFILLED_SYRINGE | INTRAVENOUS | Status: AC
  Filled 2017-03-26: qty 10

## 2017-03-26 MED ORDER — MUPIROCIN 2 % EX OINT: 1.0000 "application " | TOPICAL_OINTMENT | Freq: Two times a day (BID) | CUTANEOUS | Status: DC

## 2017-03-26 MED ORDER — SODIUM CHLORIDE 0.9% FLUSH
10.0000 mL | Freq: Two times a day (BID) | INTRAVENOUS | Status: DC
  Administered 2017-03-26 – 2017-03-28 (×5): 10 mL

## 2017-03-26 MED ORDER — TRAMADOL HCL 50 MG PO TABS
50.0000 mg | ORAL_TABLET | ORAL | Status: DC | PRN
  Administered 2017-03-27 – 2017-03-31 (×8): 100 mg via ORAL
  Filled 2017-03-26 (×8): qty 2

## 2017-03-26 MED ORDER — FAMOTIDINE IN NACL 20-0.9 MG/50ML-% IV SOLN
20.0000 mg | Freq: Two times a day (BID) | INTRAVENOUS | Status: AC
  Administered 2017-03-26 (×2): 20 mg via INTRAVENOUS
  Filled 2017-03-26: qty 50

## 2017-03-26 MED ORDER — ORAL CARE MOUTH RINSE
15.0000 mL | Freq: Two times a day (BID) | OROMUCOSAL | Status: DC
  Administered 2017-03-26 – 2017-03-28 (×4): 15 mL via OROMUCOSAL

## 2017-03-26 MED ORDER — MIDAZOLAM HCL 10 MG/2ML IJ SOLN
INTRAMUSCULAR | Status: AC
  Filled 2017-03-26: qty 2

## 2017-03-26 MED ORDER — ASPIRIN EC 325 MG PO TBEC
325.0000 mg | DELAYED_RELEASE_TABLET | Freq: Every day | ORAL | Status: DC
  Administered 2017-03-27 – 2017-03-31 (×4): 325 mg via ORAL
  Filled 2017-03-26 (×4): qty 1

## 2017-03-26 MED ORDER — MIDAZOLAM HCL 5 MG/5ML IJ SOLN
INTRAMUSCULAR | Status: DC | PRN
  Administered 2017-03-26: 2 mg via INTRAVENOUS
  Administered 2017-03-26: 1 mg via INTRAVENOUS
  Administered 2017-03-26: 2 mg via INTRAVENOUS
  Administered 2017-03-26: 1 mg via INTRAVENOUS
  Administered 2017-03-26: 2 mg via INTRAVENOUS

## 2017-03-26 MED ORDER — ALBUMIN HUMAN 5 % IV SOLN
250.0000 mL | INTRAVENOUS | Status: DC | PRN
  Administered 2017-03-27: 250 mL via INTRAVENOUS
  Filled 2017-03-26: qty 250

## 2017-03-26 MED ORDER — PROTAMINE SULFATE 10 MG/ML IV SOLN
INTRAVENOUS | Status: DC | PRN
  Administered 2017-03-26 (×2): 100 mg via INTRAVENOUS

## 2017-03-26 MED ORDER — LACTATED RINGERS IV SOLN: 500.0000 mL | Freq: Once | INTRAVENOUS | Status: DC | PRN

## 2017-03-26 MED ORDER — MUPIROCIN 2 % EX OINT
1.0000 "application " | TOPICAL_OINTMENT | Freq: Two times a day (BID) | CUTANEOUS | Status: AC
  Administered 2017-03-26 – 2017-03-29 (×8): 1 via NASAL
  Filled 2017-03-26 (×3): qty 22

## 2017-03-26 MED ORDER — PHENYLEPHRINE 40 MCG/ML (10ML) SYRINGE FOR IV PUSH (FOR BLOOD PRESSURE SUPPORT)
PREFILLED_SYRINGE | INTRAVENOUS | Status: DC | PRN
  Administered 2017-03-26: 80 ug via INTRAVENOUS

## 2017-03-26 MED ORDER — SODIUM CHLORIDE 0.9 % IV SOLN
0.0000 ug/kg/h | INTRAVENOUS | Status: DC
  Administered 2017-03-26: 0.1 ug/kg/h via INTRAVENOUS
  Filled 2017-03-26: qty 2

## 2017-03-26 MED ORDER — 0.9 % SODIUM CHLORIDE (POUR BTL) OPTIME
TOPICAL | Status: DC | PRN
  Administered 2017-03-26: 1000 mL
  Administered 2017-03-26: 5000 mL

## 2017-03-26 SURGICAL SUPPLY — 104 items
ADAPTER CARDIO PERF ANTE/RETRO (ADAPTER) ×4 IMPLANT
BAG DECANTER FOR FLEXI CONT (MISCELLANEOUS) ×4 IMPLANT
BANDAGE ACE 4X5 VEL STRL LF (GAUZE/BANDAGES/DRESSINGS) ×4 IMPLANT
BANDAGE ACE 6X5 VEL STRL LF (GAUZE/BANDAGES/DRESSINGS) ×4 IMPLANT
BASKET HEART  (ORDER IN 25'S) (MISCELLANEOUS) ×1
BASKET HEART (ORDER IN 25'S) (MISCELLANEOUS) ×1
BASKET HEART (ORDER IN 25S) (MISCELLANEOUS) ×2 IMPLANT
BINDER BREAST LRG (GAUZE/BANDAGES/DRESSINGS) ×4 IMPLANT
BLADE CLIPPER SURG (BLADE) IMPLANT
BLADE STERNUM SYSTEM 6 (BLADE) ×4 IMPLANT
BLADE SURG 12 STRL SS (BLADE) ×4 IMPLANT
BNDG GAUZE ELAST 4 BULKY (GAUZE/BANDAGES/DRESSINGS) ×4 IMPLANT
CANISTER SUCT 3000ML PPV (MISCELLANEOUS) ×4 IMPLANT
CANNULA GUNDRY RCSP 15FR (MISCELLANEOUS) ×4 IMPLANT
CATH CPB KIT VANTRIGT (MISCELLANEOUS) ×4 IMPLANT
CATH ROBINSON RED A/P 18FR (CATHETERS) ×12 IMPLANT
CATH THORACIC 36FR RT ANG (CATHETERS) ×4 IMPLANT
CLIP VESOCCLUDE SM WIDE 24/CT (CLIP) ×4 IMPLANT
CRADLE DONUT ADULT HEAD (MISCELLANEOUS) ×4 IMPLANT
DRAIN CHANNEL 32F RND 10.7 FF (WOUND CARE) ×4 IMPLANT
DRAPE CARDIOVASCULAR INCISE (DRAPES) ×2
DRAPE SLUSH/WARMER DISC (DRAPES) ×4 IMPLANT
DRAPE SRG 135X102X78XABS (DRAPES) ×2 IMPLANT
DRSG AQUACEL AG ADV 3.5X14 (GAUZE/BANDAGES/DRESSINGS) ×4 IMPLANT
ELECT BLADE 4.0 EZ CLEAN MEGAD (MISCELLANEOUS) ×4
ELECT BLADE 6.5 EXT (BLADE) ×4 IMPLANT
ELECT CAUTERY BLADE 6.4 (BLADE) ×4 IMPLANT
ELECT REM PT RETURN 9FT ADLT (ELECTROSURGICAL) ×8
ELECTRODE BLDE 4.0 EZ CLN MEGD (MISCELLANEOUS) ×2 IMPLANT
ELECTRODE REM PT RTRN 9FT ADLT (ELECTROSURGICAL) ×4 IMPLANT
FELT TEFLON 1X6 (MISCELLANEOUS) ×4 IMPLANT
FLOSEAL 10ML (HEMOSTASIS) ×4 IMPLANT
GAUZE SPONGE 4X4 12PLY STRL (GAUZE/BANDAGES/DRESSINGS) ×8 IMPLANT
GLOVE BIO SURGEON STRL SZ7.5 (GLOVE) ×12 IMPLANT
GLOVE BIO SURGEON STRL SZ8 (GLOVE) ×8 IMPLANT
GLOVE BIOGEL M STER SZ 6 (GLOVE) ×8 IMPLANT
GLOVE BIOGEL M STRL SZ7.5 (GLOVE) ×8 IMPLANT
GLOVE BIOGEL PI IND STRL 6.5 (GLOVE) ×4 IMPLANT
GLOVE BIOGEL PI IND STRL 8.5 (GLOVE) ×4 IMPLANT
GLOVE BIOGEL PI INDICATOR 6.5 (GLOVE) ×4
GLOVE BIOGEL PI INDICATOR 8.5 (GLOVE) ×4
GOWN STRL REUS W/ TWL LRG LVL3 (GOWN DISPOSABLE) ×12 IMPLANT
GOWN STRL REUS W/TWL 2XL LVL3 (GOWN DISPOSABLE) ×8 IMPLANT
GOWN STRL REUS W/TWL LRG LVL3 (GOWN DISPOSABLE) ×12
HEMOSTAT POWDER SURGIFOAM 1G (HEMOSTASIS) ×12 IMPLANT
HEMOSTAT SURGICEL 2X14 (HEMOSTASIS) ×4 IMPLANT
INSERT FOGARTY XLG (MISCELLANEOUS) IMPLANT
KIT BASIN OR (CUSTOM PROCEDURE TRAY) ×4 IMPLANT
KIT ROOM TURNOVER OR (KITS) ×4 IMPLANT
KIT SUCTION CATH 14FR (SUCTIONS) ×4 IMPLANT
KIT VASOVIEW HEMOPRO VH 3000 (KITS) ×4 IMPLANT
LEAD PACING MYOCARDI (MISCELLANEOUS) ×4 IMPLANT
MARKER GRAFT CORONARY BYPASS (MISCELLANEOUS) ×12 IMPLANT
NS IRRIG 1000ML POUR BTL (IV SOLUTION) ×24 IMPLANT
PACK E OPEN HEART (SUTURE) ×4 IMPLANT
PACK OPEN HEART (CUSTOM PROCEDURE TRAY) ×4 IMPLANT
PAD ARMBOARD 7.5X6 YLW CONV (MISCELLANEOUS) ×8 IMPLANT
PAD ELECT DEFIB RADIOL ZOLL (MISCELLANEOUS) ×4 IMPLANT
PENCIL BUTTON HOLSTER BLD 10FT (ELECTRODE) ×4 IMPLANT
POWDER SURGICEL 3.0 GRAM (HEMOSTASIS) ×4 IMPLANT
PUNCH AORTIC ROTATE 4.0MM (MISCELLANEOUS) IMPLANT
PUNCH AORTIC ROTATE 4.5MM 8IN (MISCELLANEOUS) IMPLANT
PUNCH AORTIC ROTATE 5MM 8IN (MISCELLANEOUS) IMPLANT
SET CARDIOPLEGIA MPS 5001102 (MISCELLANEOUS) ×4 IMPLANT
SPONGE LAP 18X18 X RAY DECT (DISPOSABLE) ×4 IMPLANT
SPONGE LAP 4X18 X RAY DECT (DISPOSABLE) ×4 IMPLANT
SURGIFLO W/THROMBIN 8M KIT (HEMOSTASIS) IMPLANT
SUT BONE WAX W31G (SUTURE) ×4 IMPLANT
SUT ETHIBOND 2 0 SH (SUTURE) ×2
SUT ETHIBOND 2 0 SH 36X2 (SUTURE) ×2 IMPLANT
SUT MNCRL AB 4-0 PS2 18 (SUTURE) IMPLANT
SUT PROLENE 3 0 SH DA (SUTURE) ×4 IMPLANT
SUT PROLENE 3 0 SH1 36 (SUTURE) IMPLANT
SUT PROLENE 4 0 RB 1 (SUTURE) ×4
SUT PROLENE 4 0 SH DA (SUTURE) ×4 IMPLANT
SUT PROLENE 4-0 RB1 .5 CRCL 36 (SUTURE) ×4 IMPLANT
SUT PROLENE 5 0 C 1 36 (SUTURE) IMPLANT
SUT PROLENE 6 0 C 1 30 (SUTURE) ×12 IMPLANT
SUT PROLENE 6 0 CC (SUTURE) ×20 IMPLANT
SUT PROLENE 8 0 BV175 6 (SUTURE) ×4 IMPLANT
SUT PROLENE BLUE 7 0 (SUTURE) ×4 IMPLANT
SUT SILK  1 MH (SUTURE)
SUT SILK 1 MH (SUTURE) IMPLANT
SUT SILK 2 0 SH CR/8 (SUTURE) ×4 IMPLANT
SUT SILK 3 0 SH CR/8 (SUTURE) IMPLANT
SUT STEEL 6MS V (SUTURE) ×4 IMPLANT
SUT STEEL SZ 6 DBL 3X14 BALL (SUTURE) ×4 IMPLANT
SUT VIC AB 1 CTX 27 (SUTURE) ×4 IMPLANT
SUT VIC AB 1 CTX 36 (SUTURE) ×4
SUT VIC AB 1 CTX36XBRD ANBCTR (SUTURE) ×4 IMPLANT
SUT VIC AB 2-0 CT1 27 (SUTURE) ×2
SUT VIC AB 2-0 CT1 TAPERPNT 27 (SUTURE) ×2 IMPLANT
SUT VIC AB 2-0 CTX 27 (SUTURE) IMPLANT
SUT VIC AB 3-0 X1 27 (SUTURE) ×4 IMPLANT
SYSTEM SAHARA CHEST DRAIN ATS (WOUND CARE) ×4 IMPLANT
TAPE CLOTH SURG 4X10 WHT LF (GAUZE/BANDAGES/DRESSINGS) ×8 IMPLANT
TAPE PAPER 2X10 WHT MICROPORE (GAUZE/BANDAGES/DRESSINGS) ×4 IMPLANT
TOWEL GREEN STERILE (TOWEL DISPOSABLE) ×4 IMPLANT
TOWEL GREEN STERILE FF (TOWEL DISPOSABLE) ×4 IMPLANT
TRAY FOLEY SILVER 16FR TEMP (SET/KITS/TRAYS/PACK) ×4 IMPLANT
TUBING INSUFFLATION (TUBING) ×4 IMPLANT
UNDERPAD 30X30 (UNDERPADS AND DIAPERS) ×4 IMPLANT
WATER STERILE IRR 1000ML POUR (IV SOLUTION) ×8 IMPLANT
YANKAUER SUCT BULB TIP NO VENT (SUCTIONS) ×4 IMPLANT

## 2017-03-26 NOTE — Progress Notes (Signed)
Patient ID: Gabrielle Page, female   DOB: 03-Dec-1960, 57 y.o.   MRN: [id number] EVENING ROUNDS NOTE :     [address]       [place] [address]             [phone]                 Day of Surgery Procedure(s) (LRB): CORONARY ARTERY BYPASS GRAFTING (CABG) x 3 WITH ENDOSCOPIC HARVESTING OF RIGHT SAPHENOUS VEIN (N/A) TRANSESOPHAGEAL ECHOCARDIOGRAM (TEE) (N/A)  Total Length of Stay:  LOS: 5 days  BP (!) 87/65   Pulse 81   Temp 98.2 F (36.8 C)   Resp 18   Ht 5\' 3"  (1.6 m)   Wt 140 lb 11.2 oz (63.8 kg)   SpO2 98%   BMI 24.92 kg/m   .Intake/Output      02/11 0701 - 02/12 0700 02/12 0701 - 02/13 0700   P.O. 1080    I.V. (mL/kg)  2578.6 (40.4)   Blood  779   IV Piggyback  850   Total Intake(mL/kg) 1080 (16.9) 4207.6 (65.9)   Urine (mL/kg/hr) 450 (0.3) 1760 (2.3)   Blood  800   Chest Tube  170   Total Output 450 2730   Net +630 +1477.6        Urine Occurrence 2 x      . sodium chloride 20 mL/hr at 03/26/17 1400  . [START ON 03/27/2017] sodium chloride    . sodium chloride 20 mL/hr at 03/26/17 1400  . albumin human    . cefUROXime (ZINACEF)  IV Stopped (03/26/17 1643)  . desmopressin (DDAVP) IV    . dexmedetomidine (PRECEDEX) IV infusion 0.1 mcg/kg/hr (03/26/17 1838)  . famotidine (PEPCID) IV Stopped (03/26/17 1533)  . heparin Stopped (03/26/17 0505)  . insulin (NOVOLIN-R) infusion 1.3 Units/hr (03/26/17 1857)  . lactated ringers Stopped (03/26/17 1400)  . lactated ringers 20 mL/hr at 03/26/17 1534  . magnesium sulfate 4 g (03/26/17 1400)  . nitroGLYCERIN 20 mcg/min (03/26/17 1845)  . phenylephrine (NEO-SYNEPHRINE) Adult infusion Stopped (03/26/17 1536)  . vancomycin       Lab Results  Component Value Date   WBC 7.9 03/26/2017   HGB 9.5 (L) 03/26/2017   HCT 28.0 (L) 03/26/2017   PLT 129 (L) 03/26/2017   GLUCOSE 126 (H) 03/26/2017   CHOL 132 11/06/2016   TRIG 211 (H) 11/06/2016   HDL 36 (L) 11/06/2016   LDLCALC 54 11/06/2016   ALT 282  (H) 03/26/2017   AST 72 (H) 03/26/2017   NA 140 03/26/2017   K 4.0 03/26/2017   CL 102 03/26/2017   CREATININE 0.30 (L) 03/26/2017   BUN 9 03/26/2017   CO2 27 03/26/2017   TSH 1.353 03/23/2017   INR 1.15 03/26/2017   HGBA1C 5.7 (H) 03/23/2017   Not bleeding Just extubated    Grace Isaac MD  Beeper [phone] Office [phone] 03/26/2017 6:58 PM

## 2017-03-26 NOTE — Progress Notes (Signed)
[facility] hospitalist note  Review labs and patient has much improved.  Patient has looked well with serial examination.  No abnormal imaging.  As stated in other notes is likely due to acute exposure.  I recommend getting follow-up LFTs in the morning and at follow-up after discharge.  Hospitalists signing off please reconsult [place] if further assistance is needed.  Thank you for allowing [place] to share in the care of this patient.  Elwin Mocha MD

## 2017-03-26 NOTE — Progress Notes (Addendum)
Ed Redmond Baseman is patients significant other and would like to be called with any information about patients procedure today.  He will be on site at [facility].  His contact number is [phone].

## 2017-03-26 NOTE — Progress Notes (Signed)
  Echocardiogram Echocardiogram Transesophageal has been performed.  Bobbye Charleston 03/26/2017, 9:11 AM

## 2017-03-26 NOTE — Anesthesia Preprocedure Evaluation (Signed)
Anesthesia Evaluation  Patient identified by MRN, date of birth, ID band Patient awake    Reviewed: Allergy & Precautions, NPO status , Patient's Chart, lab work & pertinent test results  Airway Mallampati: II       Dental no notable dental hx.    Pulmonary asthma ,    breath sounds clear to auscultation       Cardiovascular + angina + CAD and + Past MI  + dysrhythmias  Rhythm:Regular Rate:Normal     Neuro/Psych    GI/Hepatic GERD  ,(+) Hepatitis -  Endo/Other  negative endocrine ROS  Renal/GU negative Renal ROS     Musculoskeletal   Abdominal   Peds  Hematology  (+) anemia ,   Anesthesia Other Findings   Reproductive/Obstetrics                             Anesthesia Physical Anesthesia Plan  ASA: III  Anesthesia Plan: General   Post-op Pain Management:    Induction: Intravenous  PONV Risk Score and Plan: 3 and 4 or greater and Treatment may vary due to age or medical condition, Dexamethasone and Ondansetron  Airway Management Planned: Oral ETT  Additional Equipment: Arterial line, PA Cath, TEE and Ultrasound Guidance Line Placement  Intra-op Plan:   Post-operative Plan: Post-operative intubation/ventilation  Informed Consent: I have reviewed the patients History and Physical, chart, labs and discussed the procedure including the risks, benefits and alternatives for the proposed anesthesia with the patient or authorized representative who has indicated his/her understanding and acceptance.   Dental advisory given  Plan Discussed with: CRNA  Anesthesia Plan Comments:         Anesthesia Quick Evaluation

## 2017-03-26 NOTE — Transfer of Care (Signed)
Immediate Anesthesia Transfer of Care Note  Patient: Gabrielle Page  Procedure(s) Performed: CORONARY ARTERY BYPASS GRAFTING (CABG) x 3 WITH ENDOSCOPIC HARVESTING OF RIGHT SAPHENOUS VEIN (N/A Chest) TRANSESOPHAGEAL ECHOCARDIOGRAM (TEE) (N/A )  Patient Location: ICU  Anesthesia Type:General  Level of Consciousness: unresponsive and Patient remains intubated per anesthesia plan  Airway & Oxygen Therapy: Patient remains intubated per anesthesia plan  Post-op Assessment: Report given to RN and Post -op Vital signs reviewed and stable  Post vital signs: Reviewed and stable  Last Vitals:  Vitals:   03/26/17 0505 03/26/17 1335  BP:  (!) 165/89  Pulse: 88 88  Resp:  (!) 25  Temp:    SpO2:  100%    Last Pain:  Vitals:   03/26/17 0503  TempSrc: Oral  PainSc:       Patients Stated Pain Goal: 0 (56/43/32 9518)  Complications: No apparent anesthesia complications

## 2017-03-26 NOTE — Procedures (Signed)
Extubation Procedure Note  Patient Details:   Name: Gabrielle Page DOB: [birth year] MRN: [id number]   Airway Documentation:     Evaluation  O2 sats: stable throughout Complications: No apparent complications Patient did tolerate procedure well. Bilateral Breath Sounds: Clear   Yes   Patient extubated to 2L nasal cannula per Rapid Wean protocol.  NIF of -24, VC of 1.5L performed with good effort.  Positive cuff leak noted.  No evidence of stridor.  Patient able to speak post extubation.  Sats currently 100%.  Vitals are stable.  Incentive spirometry performed with RN with achieved goal of 500.  No complications noted.   Philomena Doheny 03/26/2017, 5:26 PM

## 2017-03-26 NOTE — Anesthesia Procedure Notes (Signed)
Procedure Name: Intubation Date/Time: 03/26/2017 7:41 AM Performed by: Moshe Salisbury, CRNA Pre-anesthesia Checklist: Patient identified, Emergency Drugs available, Suction available and Patient being monitored Patient Re-evaluated:Patient Re-evaluated prior to induction Oxygen Delivery Method: Circle System Utilized Preoxygenation: Pre-oxygenation with 100% oxygen Induction Type: IV induction Ventilation: Mask ventilation without difficulty Laryngoscope Size: Mac and 3 Grade View: Grade I Tube type: Oral Tube size: 8.0 mm Number of attempts: 1 Airway Equipment and Method: Stylet Placement Confirmation: ETT inserted through vocal cords under direct vision,  positive ETCO2 and breath sounds checked- equal and bilateral Secured at: 21 cm Tube secured with: Tape Dental Injury: Teeth and Oropharynx as per pre-operative assessment

## 2017-03-26 NOTE — Progress Notes (Signed)
Pre Procedure note for inpatients:   Gabrielle Page has been scheduled for Procedure(s): CORONARY ARTERY BYPASS GRAFTING (CABG) (N/A) TRANSESOPHAGEAL ECHOCARDIOGRAM (TEE) (N/A) today. The various methods of treatment have been discussed with the patient. After consideration of the risks, benefits and treatment options the patient has consented to the planned procedure.   The patient has been seen and labs reviewed. There are no changes in the patient's condition to prevent proceeding with the planned procedure today.  Recent labs:  Lab Results  Component Value Date   WBC 6.3 03/26/2017   HGB 10.1 (L) 03/26/2017   HCT 30.7 (L) 03/26/2017   PLT 230 03/26/2017   GLUCOSE 108 (H) 03/26/2017   CHOL 132 11/06/2016   TRIG 211 (H) 11/06/2016   HDL 36 (L) 11/06/2016   LDLCALC 54 11/06/2016   ALT 282 (H) 03/26/2017   AST 72 (H) 03/26/2017   NA 137 03/26/2017   K 4.1 03/26/2017   CL 99 (L) 03/26/2017   CREATININE 0.63 03/26/2017   BUN 12 03/26/2017   CO2 27 03/26/2017   TSH 1.353 03/23/2017   INR 0.91 03/24/2017   HGBA1C 5.7 (H) 03/23/2017    Len Childs, MD 03/26/2017 7:15 AM

## 2017-03-26 NOTE — Anesthesia Procedure Notes (Signed)
Arterial Line Insertion Start/End2/01/2018 6:50 AM, 03/26/2017 7:00 AM Performed by: Moshe Salisbury, CRNA, CRNA  Patient location: Pre-op. Preanesthetic checklist: patient identified, IV checked, site marked, risks and benefits discussed, surgical consent, monitors and equipment checked, pre-op evaluation, timeout performed and anesthesia consent Lidocaine 1% used for infiltration and patient sedated Left, radial was placed Catheter size: 20 G Hand hygiene performed , maximum sterile barriers used  and Seldinger technique used  Attempts: 1 Procedure performed without using ultrasound guided technique. Following insertion, dressing applied and Biopatch. Post procedure assessment: normal  Patient tolerated the procedure well with no immediate complications.

## 2017-03-26 NOTE — Progress Notes (Signed)
Patient is alert and oriented. Patient is ready for her procedure this morning. Patients significant other and sister are at bedside.

## 2017-03-26 NOTE — Progress Notes (Signed)
RT note: rapid wean protocol initiated at 1628.

## 2017-03-26 NOTE — Brief Op Note (Signed)
03/21/2017 - 03/26/2017  11:38 AM  PATIENT:  Hughie Closs  57 y.o. female  PRE-OPERATIVE DIAGNOSIS:  CAD  POST-OPERATIVE DIAGNOSIS:  CAD  PROCEDURE:  Procedure(s): CORONARY ARTERY BYPASS GRAFTING (CABG) x 3 WITH ENDOSCOPIC HARVESTING OF RIGHT SAPHENOUS VEIN (N/A) TRANSESOPHAGEAL ECHOCARDIOGRAM (TEE) (N/A)  SURGEON:  Surgeon(s) and Role:    Ivin Poot, MD - Primary  PHYSICIAN ASSISTANT: WAYNE GOLD PA-C  ANESTHESIA:   general  EBL:  500cc   BLOOD ADMINISTERED:2 UNITS PRBC'S, , 1 FFP, 1 PLATELETS  DRAINS: ROUTINE MEDIASTINAL AND PLEURAL CHEST TUBES   LOCAL MEDICATIONS USED:  NONE  SPECIMEN:  No Specimen  DISPOSITION OF SPECIMEN:  N/A  COUNTS:  YES  TOURNIQUET:  * No tourniquets in log *  DICTATION: PENDING  PLAN OF CARE: Admit to inpatient   PATIENT DISPOSITION:  ICU - intubated and hemodynamically stable.   Delay start of Pharmacological VTE agent (>24hrs) due to surgical blood loss or risk of bleeding: yes  COMPLICATIONS: NO KNOWN

## 2017-03-26 NOTE — Anesthesia Procedure Notes (Signed)
Central Venous Catheter Insertion Performed by: Albertha Ghee, MD, anesthesiologist Start/End2/01/2018 7:01 AM, 03/26/2017 7:13 AM Patient location: Pre-op. Preanesthetic checklist: patient identified, IV checked, site marked, risks and benefits discussed, surgical consent, monitors and equipment checked, pre-op evaluation, timeout performed and anesthesia consent Position: Trendelenburg Lidocaine 1% used for infiltration and patient sedated Hand hygiene performed , maximum sterile barriers used  and Seldinger technique used Catheter size: 8.5 Fr Central line was placed.Sheath introducer Swan type:thermodilation Procedure performed using ultrasound guided technique. Ultrasound Notes:anatomy identified, needle tip was noted to be adjacent to the nerve/plexus identified, no ultrasound evidence of intravascular and/or intraneural injection and image(s) printed for medical record Attempts: 1 Following insertion, line sutured, dressing applied and Biopatch. Post procedure assessment: blood return through all ports, free fluid flow and no air  Patient tolerated the procedure well with no immediate complications.

## 2017-03-27 ENCOUNTER — Inpatient Hospital Stay (HOSPITAL_COMMUNITY): Payer: Self-pay

## 2017-03-27 ENCOUNTER — Encounter (HOSPITAL_COMMUNITY): Payer: Self-pay | Admitting: Cardiothoracic Surgery

## 2017-03-27 LAB — POCT I-STAT 3, ART BLOOD GAS (G3+)
Acid-base deficit: 1 mmol/L (ref 0.0–2.0)
BICARBONATE: 25.5 mmol/L (ref 20.0–28.0)
BICARBONATE: 25 mmol/L (ref 20.0–28.0)
O2 Saturation: 97 %
O2 Saturation: 96 %
PCO2 ART: 45.2 mmHg (ref 32.0–48.0)
PCO2 ART: 45.6 mmHg (ref 32.0–48.0)
PH ART: 7.357 (ref 7.350–7.450)
PH ART: 7.347 — AB (ref 7.350–7.450)
PO2 ART: 87 mmHg (ref 83.0–108.0)
Patient temperature: 36.5
Patient temperature: 36.8
TCO2: 27 mmol/L (ref 22–32)
TCO2: 26 mmol/L (ref 22–32)
pO2, Arterial: 97 mmHg (ref 83.0–108.0)

## 2017-03-27 LAB — POCT I-STAT, CHEM 8
BUN: 11 mg/dL (ref 6–20)
BUN: 17 mg/dL (ref 6–20)
CHLORIDE: 101 mmol/L (ref 101–111)
CREATININE: 0.6 mg/dL (ref 0.44–1.00)
Calcium, Ion: 1.21 mmol/L (ref 1.15–1.40)
Calcium, Ion: 1.33 mmol/L (ref 1.15–1.40)
Chloride: 94 mmol/L — ABNORMAL LOW (ref 101–111)
Creatinine, Ser: 0.4 mg/dL — ABNORMAL LOW (ref 0.44–1.00)
GLUCOSE: 127 mg/dL — AB (ref 65–99)
Glucose, Bld: 128 mg/dL — ABNORMAL HIGH (ref 65–99)
HCT: 27 % — ABNORMAL LOW (ref 36.0–46.0)
HEMATOCRIT: 28 % — AB (ref 36.0–46.0)
HEMOGLOBIN: 9.2 g/dL — AB (ref 12.0–15.0)
Hemoglobin: 9.5 g/dL — ABNORMAL LOW (ref 12.0–15.0)
POTASSIUM: 4 mmol/L (ref 3.5–5.1)
Potassium: 3.9 mmol/L (ref 3.5–5.1)
SODIUM: 138 mmol/L (ref 135–145)
Sodium: 133 mmol/L — ABNORMAL LOW (ref 135–145)
TCO2: 25 mmol/L (ref 22–32)
TCO2: 26 mmol/L (ref 22–32)

## 2017-03-27 LAB — BASIC METABOLIC PANEL
ANION GAP: 12 (ref 5–15)
BUN: 12 mg/dL (ref 6–20)
CALCIUM: 9 mg/dL (ref 8.9–10.3)
CO2: 22 mmol/L (ref 22–32)
Chloride: 102 mmol/L (ref 101–111)
Creatinine, Ser: 0.59 mg/dL (ref 0.44–1.00)
Glucose, Bld: 118 mg/dL — ABNORMAL HIGH (ref 65–99)
Potassium: 4 mmol/L (ref 3.5–5.1)
Sodium: 136 mmol/L (ref 135–145)

## 2017-03-27 LAB — GLUCOSE, CAPILLARY
GLUCOSE-CAPILLARY: 115 mg/dL — AB (ref 65–99)
GLUCOSE-CAPILLARY: 108 mg/dL — AB (ref 65–99)
GLUCOSE-CAPILLARY: 94 mg/dL (ref 65–99)
GLUCOSE-CAPILLARY: 105 mg/dL — AB (ref 65–99)
GLUCOSE-CAPILLARY: 122 mg/dL — AB (ref 65–99)
GLUCOSE-CAPILLARY: 94 mg/dL (ref 65–99)
Glucose-Capillary: 106 mg/dL — ABNORMAL HIGH (ref 65–99)
Glucose-Capillary: 109 mg/dL — ABNORMAL HIGH (ref 65–99)
Glucose-Capillary: 109 mg/dL — ABNORMAL HIGH (ref 65–99)
Glucose-Capillary: 111 mg/dL — ABNORMAL HIGH (ref 65–99)
Glucose-Capillary: 116 mg/dL — ABNORMAL HIGH (ref 65–99)
Glucose-Capillary: 118 mg/dL — ABNORMAL HIGH (ref 65–99)
Glucose-Capillary: 149 mg/dL — ABNORMAL HIGH (ref 65–99)
Glucose-Capillary: 91 mg/dL (ref 65–99)
Glucose-Capillary: 95 mg/dL (ref 65–99)
Glucose-Capillary: 96 mg/dL (ref 65–99)

## 2017-03-27 LAB — COMPREHENSIVE METABOLIC PANEL
ALBUMIN: 3.5 g/dL (ref 3.5–5.0)
ALT: 139 U/L — ABNORMAL HIGH (ref 14–54)
AST: 53 U/L — AB (ref 15–41)
Alkaline Phosphatase: 88 U/L (ref 38–126)
Anion gap: 10 (ref 5–15)
BUN: 14 mg/dL (ref 6–20)
CHLORIDE: 100 mmol/L — AB (ref 101–111)
CO2: 23 mmol/L (ref 22–32)
Calcium: 9.1 mg/dL (ref 8.9–10.3)
Creatinine, Ser: 0.64 mg/dL (ref 0.44–1.00)
GFR calc Af Amer: >60 mL/min (ref >60)
GFR calc non Af Amer: >60 mL/min (ref >60)
Glucose, Bld: 91 mg/dL (ref 65–99)
POTASSIUM: 4 mmol/L (ref 3.5–5.1)
Sodium: 133 mmol/L — ABNORMAL LOW (ref 135–145)
Total Bilirubin: 0.8 mg/dL (ref 0.3–1.2)
Total Protein: 5.7 g/dL — ABNORMAL LOW (ref 6.5–8.1)

## 2017-03-27 LAB — CBC
HEMATOCRIT: 27.4 % — AB (ref 36.0–46.0)
HEMATOCRIT: 27 % — AB (ref 36.0–46.0)
HEMOGLOBIN: 9.3 g/dL — AB (ref 12.0–15.0)
Hemoglobin: 8.9 g/dL — ABNORMAL LOW (ref 12.0–15.0)
MCH: 30.3 pg (ref 26.0–34.0)
MCH: 30 pg (ref 26.0–34.0)
MCHC: 33.9 g/dL (ref 30.0–36.0)
MCHC: 33 g/dL (ref 30.0–36.0)
MCV: 89.3 fL (ref 78.0–100.0)
MCV: 90.9 fL (ref 78.0–100.0)
PLATELETS: 166 10*3/uL (ref 150–400)
Platelets: 194 10*3/uL (ref 150–400)
RBC: 3.07 MIL/uL — ABNORMAL LOW (ref 3.87–5.11)
RBC: 2.97 MIL/uL — ABNORMAL LOW (ref 3.87–5.11)
RDW: 15.9 % — AB (ref 11.5–15.5)
RDW: 15.8 % — AB (ref 11.5–15.5)
WBC: 9.5 10*3/uL (ref 4.0–10.5)
WBC: 10.5 10*3/uL (ref 4.0–10.5)

## 2017-03-27 LAB — CREATININE, SERUM
CREATININE: 0.68 mg/dL (ref 0.44–1.00)
GFR calc Af Amer: >60 mL/min (ref >60)

## 2017-03-27 LAB — HEPARIN LEVEL (UNFRACTIONATED)

## 2017-03-27 LAB — BPAM FFP
Blood Product Expiration Date: 201902162359
ISSUE DATE / TIME: 201902121111
Unit Type and Rh: 6200

## 2017-03-27 LAB — MAGNESIUM
MAGNESIUM: 1.9 mg/dL (ref 1.7–2.4)
Magnesium: 2.1 mg/dL (ref 1.7–2.4)

## 2017-03-27 LAB — PREPARE FRESH FROZEN PLASMA: Unit division: 0

## 2017-03-27 LAB — BPAM PLATELET PHERESIS
Blood Product Expiration Date: 201902142359
ISSUE DATE / TIME: 201902121111
Unit Type and Rh: 6200

## 2017-03-27 LAB — PREPARE PLATELET PHERESIS: Unit division: 0

## 2017-03-27 MED ORDER — KETOROLAC TROMETHAMINE 15 MG/ML IJ SOLN
15.0000 mg | Freq: Four times a day (QID) | INTRAMUSCULAR | Status: DC
  Administered 2017-03-27 – 2017-03-28 (×4): 15 mg via INTRAVENOUS
  Filled 2017-03-27 (×4): qty 1

## 2017-03-27 MED ORDER — INSULIN ASPART 100 UNIT/ML ~~LOC~~ SOLN
0.0000 [IU] | SUBCUTANEOUS | Status: DC
  Administered 2017-03-27 – 2017-03-28 (×3): 2 [IU] via SUBCUTANEOUS

## 2017-03-27 MED ORDER — KETOROLAC TROMETHAMINE 15 MG/ML IJ SOLN: 15.0000 mg | Freq: Four times a day (QID) | INTRAMUSCULAR | Status: DC

## 2017-03-27 MED ORDER — FUROSEMIDE 10 MG/ML IJ SOLN
20.0000 mg | Freq: Once | INTRAMUSCULAR | Status: AC
  Administered 2017-03-27: 20 mg via INTRAVENOUS
  Filled 2017-03-27: qty 2

## 2017-03-27 MED ORDER — FUROSEMIDE 10 MG/ML IJ SOLN
40.0000 mg | Freq: Two times a day (BID) | INTRAMUSCULAR | Status: DC
  Administered 2017-03-28 – 2017-03-31 (×7): 40 mg via INTRAVENOUS
  Filled 2017-03-27 (×7): qty 4

## 2017-03-27 MED ORDER — INSULIN DETEMIR 100 UNIT/ML ~~LOC~~ SOLN
10.0000 [IU] | Freq: Once | SUBCUTANEOUS | Status: AC
  Administered 2017-03-27: 10 [IU] via SUBCUTANEOUS
  Filled 2017-03-27: qty 0.1

## 2017-03-27 MED ORDER — MIDAZOLAM HCL 2 MG/2ML IJ SOLN
INTRAMUSCULAR | Status: AC
  Filled 2017-03-27: qty 2

## 2017-03-27 MED ORDER — FENTANYL CITRATE (PF) 100 MCG/2ML IJ SOLN
50.0000 ug | INTRAMUSCULAR | Status: DC | PRN
Start: 2017-03-27 — End: 2017-03-29
  Administered 2017-03-27 – 2017-03-28 (×10): 50 ug via INTRAVENOUS
  Filled 2017-03-27 (×10): qty 2

## 2017-03-27 MED ORDER — FUROSEMIDE 10 MG/ML IJ SOLN
20.0000 mg | Freq: Two times a day (BID) | INTRAMUSCULAR | Status: DC
  Administered 2017-03-27 (×2): 20 mg via INTRAVENOUS
  Filled 2017-03-27 (×2): qty 2

## 2017-03-27 MED FILL — Magnesium Sulfate Inj 50%: INTRAMUSCULAR | Qty: 10 | Status: AC

## 2017-03-27 MED FILL — Potassium Chloride Inj 2 mEq/ML: INTRAVENOUS | Qty: 40 | Status: AC

## 2017-03-27 MED FILL — Heparin Sodium (Porcine) Inj 1000 Unit/ML: INTRAMUSCULAR | Qty: 30 | Status: AC

## 2017-03-27 NOTE — Progress Notes (Signed)
CT surgery p.m. Rounds  Patient had good day with 2 ambulation's Pain is improved Needs more Lasix for diuresis P.m. labs satisfactory Maintained sinus rhythm and stable blood pressure

## 2017-03-27 NOTE — Care Management Note (Addendum)
Case Management Note  Patient Details  Name: Gabrielle Page MRN: [id number] Date of Birth: 03-29-1960  Subjective/Objective:    Per previous NCM note  2/11 by Gabrielle Page -Pt presented for angina. S/p cath 03-22-17-plan for CABG 03-26-17 post Brilinta Washout. Pt is without insurance and gets her medications from the [facility]. Pt will need an appointment for PCP once stable for d/c.   2/13 [place] RN, BSN-from home with boyfriend, pta indep, POD 1 CABG, conts with chest tube to suction, conts on iv abx, iv lasix, iv pain meds, nitro drip. Plan is home when stable.  2/14 [place], BSN- NCM tried to schedule hospital follow up apt, but [facility] not taking any apts til 3/1 and [name] not taking apts til 3/1.                      Action/Plan: NCM will follow for transition of care.   Expected Discharge Date:  04/01/17               Expected Discharge Plan:  Home/Self Care  In-House Referral:  NA  Discharge planning Services  CM Consult, Medication Assistance  Post Acute Care Choice:    Choice offered to:     DME Arranged:    DME Agency:     HH Arranged:    HH Agency:     Status of Service:  In process, will continue to follow  If discussed at Long Length of Stay Meetings, dates discussed:    Additional Comments:  Gabrielle Mayo, RN 03/27/2017, 7:04 PM

## 2017-03-27 NOTE — Progress Notes (Signed)
1 Day Post-Op Procedure(s) (LRB): CORONARY ARTERY BYPASS GRAFTING (CABG) x 3 WITH ENDOSCOPIC HARVESTING OF RIGHT SAPHENOUS VEIN (N/A) TRANSESOPHAGEAL ECHOCARDIOGRAM (TEE) (N/A) Subjective: Needs better pain control Check postop LFTs nsr off drips   Objective: Vital signs in last 24 hours: Temp:  [96.1 F (35.6 C)-99.7 F (37.6 C)] 99.1 F (37.3 C) (02/13 0700) Pulse Rate:  [59-104] 100 (02/13 0700) Cardiac Rhythm: Normal sinus rhythm (02/13 0400) Resp:  [10-42] 19 (02/13 0800) BP: (87-165)/(60-89) 134/80 (02/13 0800) SpO2:  [91 %-100 %] 92 % (02/13 0800) Arterial Line BP: (98-209)/(19-84) 138/60 (02/13 0800) FiO2 (%):  [40 %-50 %] 40 % (02/12 1650) Weight:  [154 lb 15.7 oz (70.3 kg)] 154 lb 15.7 oz (70.3 kg) (02/13 0500)  Hemodynamic parameters for last 24 hours: PAP: (12-56)/(5-24) 34/14 CO:  [3.3 L/min-5.8 L/min] 5.8 L/min CI:  [1.9 L/min/m2-3.5 L/min/m2] 3.5 L/min/m2  Intake/Output from previous day: 02/12 0701 - 02/13 0700 In: 5252.3 [I.V.:3373.3; Blood:779; IV Piggyback:1100] Out: 3488 [Urine:2270; Blood:800; Chest Tube:418] Intake/Output this shift: Total I/O In: 161.4 [P.O.:120; I.V.:41.4] Out: -        Exam    General- alert and c/o chest wall pain    Neck- no JVD, no cervical adenopathy palpable, no carotid bruit   Lungs- clear without rales, wheezes   Cor- regular rate and rhythm, no murmur , gallop   Abdomen- soft, non-tender   Extremities - warm, non-tender, minimal edema   Neuro- oriented, appropriate, no focal weakness   Lab Results: Recent Labs    03/26/17 1904 03/26/17 1919 03/27/17 0406  WBC 9.3  --  9.5  HGB 8.8* 9.5* 9.3*  HCT 26.3* 28.0* 27.4*  PLT 171  --  194   BMET:  Recent Labs    03/26/17 0424  03/26/17 1919 03/27/17 0406  NA 137   < > 138 136  K 4.1   < > 4.0 4.0  CL 99*   < > 101 102  CO2 27  --   --  22  GLUCOSE 108*   < > 128* 118*  BUN 12   < > 11 12  CREATININE 0.63   < > 0.40* 0.59  CALCIUM 9.7  --   --  9.0   < > = values in this interval not displayed.    PT/INR:  Recent Labs    03/26/17 1337  LABPROT 14.6  INR 1.15   ABG    Component Value Date/Time   PHART 7.347 (L) 03/26/2017 1826   HCO3 25.0 03/26/2017 1826   TCO2 25 03/26/2017 1919   ACIDBASEDEF 1.0 03/26/2017 1826   O2SAT 96.0 03/26/2017 1826   CBG (last 3)  Recent Labs    03/27/17 0459 03/27/17 0606 03/27/17 0704  GLUCAP 111* 115* 108*    Assessment/Plan: S/P Procedure(s) (LRB): CORONARY ARTERY BYPASS GRAFTING (CABG) x 3 WITH ENDOSCOPIC HARVESTING OF RIGHT SAPHENOUS VEIN (N/A) TRANSESOPHAGEAL ECHOCARDIOGRAM (TEE) (N/A) Mobilize Diuresis See progression orders follow daily LFTs - preop elevated transaminases   LOS: 6 days    Gabrielle Page 03/27/2017

## 2017-03-27 NOTE — Progress Notes (Signed)
  Dr. Aggie Moats had signed off on 03/26/17.  I called and discussed with Dr. Prescott Gum, TCTS attending to confirm the same and he indicated that he did not need [facility] assistance at this time and [facility] can sign off.  Please consult [facility] again for any further assistance.  Vernell Leep, MD, FACP, [facility]. [facility] Pager [phone]  If 7PM-7AM, please contact night-coverage [email or web address] Password [email or web address] 03/27/2017, 11:35 AM

## 2017-03-27 NOTE — Op Note (Signed)
NAME:  Gabrielle Page, Gabrielle Page              ACCOUNT NO.:  MEDICAL RECORD NO.:  [id number]  LOCATION:                                 FACILITY:  PHYSICIAN:  Ivin Poot, M.D.  DATE OF BIRTH:  09/29/1961  DATE OF PROCEDURE:  03/26/2017 DATE OF DISCHARGE:                              OPERATIVE REPORT   OPERATIONS: 1. Coronary artery bypass grafting x3 (left internal mammary artery to     left anterior descending, saphenous vein graft to diagonal,     saphenous vein graft to circumflex marginal). 2. Endoscopic harvest of right leg greater saphenous vein.  SURGEON:  Ivin Poot, MD.  ASSISTANT:  [name], PA-C.  ANESTHESIA:  General by Dr. Rica Koyanagi.  PREOPERATIVE DIAGNOSES:  Unstable angina, non ST-elevation myocardial infarction, severe multivessel coronary artery disease with left main stenosis, prior percutaneous coronary intervention of the proximal left anterior descending in September 2018.  POSTOPERATIVE DIAGNOSES:  Unstable angina, non ST-elevation myocardial infarction, severe multivessel coronary artery disease with left main stenosis, prior percutaneous coronary intervention of the proximal left anterior descending in September 2018.  CLINICAL NOTE:  The patient is a 57 year old female, nonsmoker, who had an anterior MI last fall and underwent PCI of a high-grade proximal LAD stenosis.  She was placed on Brilinta.  She did well for several weeks, but then had recurrent symptoms of unstable angina and was re-evaluated and found to have positive troponins.  She underwent repeat catheterization, which showed a patent stent, but a new high-grade 75- 80% stenosis of the origin of the LAD extending into the left main. There was a diagonal branch of the LAD, which also had a 90% stenosis. Her ejection fraction remained at 45%.  She was placed on IV heparin and nitroglycerin.  She was recommended for CABG and I saw the patient in consultation.  I agreed with  the recommendation for CABG for treatment of her progressive coronary artery disease.  I recommend a 3- to- 4-day period of washout of her Brilinta that she had been taking for the stent.  She remained stable in the hospital until the surgery.  Prior to the surgery, I had a discussion on more than one occasion with the patient and her husband-family regarding the details of surgery to include the use of general anesthesia and cardiopulmonary bypass, the location of the surgical incisions, and the expected postoperative hospital recovery.  I reviewed with the patient the risks to her of the operation including risk of stroke, bleeding, blood transfusion requirement, postoperative pulmonary problems including pleural effusion, postoperative infection, postoperative organ failure, and death.  She demonstrated her understanding and agreed to proceed with surgery under what I felt was an informed consent.  OPERATIVE FINDINGS: 1. Adequate conduit. 2. Small but adequate targets. 3. Preoperative anemia requiring 2 units of packed cell transfusion     for the surgery.  DESCRIPTION OF PROCEDURE:  The patient was brought from the preop holding area to the operating room and placed supine on the operating table.  General anesthesia was induced under invasive hemodynamic monitoring.  The patient remained stable.  A transesophageal echo probe was placed by the Anesthesia team.  This showed  some anterior hypokinesia, but no significant valvular disease.  The patient was prepped and draped as a sterile field.  A proper time-out was performed. A sternal incision was made as the saphenous vein was harvested endoscopically from the right leg.  The left internal mammary artery was harvested as a pedicle graft from its origin at the subclavian vessels. This was a 1.5-mm vessel with good flow.  The sternal retractor was placed and the pericardium was opened and suspended.  Pursestrings were placed in  ascending aorta and right atrium and heparin was administered. When the ACT was therapeutic, the patient was cannulated and placed on bypass.  The coronaries were identified for grafting.  The mammary artery and vein grafts were prepared for the distal anastomosis and cardioplegia cannulas were placed both antegrade and retrograde cold blood cardioplegia.  The patient was cooled to 32 degrees and aortic crossclamp was applied.  One liter of cold blood cardioplegia was delivered in split doses between the antegrade aortic and retrograde coronary sinus catheters.  There was good cardioplegic arrest and septal temperature dropped less than 14 degrees.  Cardioplegia was delivered every 20 minutes.  The distal coronary anastomoses were performed.  The first distal anastomosis was to the OM branch of the left circumflex.  This had a proximal 75% left main.  The reversed saphenous vein was sewn end-to- side with running 7-0 Prolene and there was good flow through the graft. Cardioplegia was redosed.  The second distal anastomosis was to the diagonal branch of the LAD, which had a proximal 90% stenosis in the area of the previously placed stent.  A reversed saphenous vein was sewn end-to-side with running 7-0 Prolene and there was good flow through the graft.  Cardioplegia was redosed.  The third distal anastomosis was to the mid portion the LAD.  This was a 1.5-mm vessel with proximal 80% stenosis.  The left IMA pedicle was brought through an opening in the left lateral pericardium, was brought down onto the LAD and sewn end-to-side with running 8-0 Prolene.  There was good flow through the anastomosis after briefly releasing the pedicle bulldog on the mammary artery.  The bulldog was reapplied and the pedicle was secured to epicardium.  Cardioplegia was redosed.  With the crossclamp still in place, 2 proximal vein anastomoses were performed with a 4.0-mm punch and running 6-0 Prolene.   Prior to tying down the final proximal anastomosis, air was vented from the coronaries with a dose of retrograde warm blood cardioplegia in the usual de-airing maneuvers.  The crossclamp was removed.  The heart resumed a spontaneous rhythm.  The vein grafts were de-aired and opened and each had good flow and hemostasis was documented at the proximal and distal anastomoses.  The patient was rewarmed and reperfused.  Temporary pacing wires were applied.  The lungs were expanded.  The ventilator was resumed.  The patient was placed on low- dose inotropic support and was weaned off bypass easily after 1 initial attempt resulted in transient RV dysfunction, which completely resolved with resting the patient a few minutes additionally on bypass.  Protamine was administered without adverse reaction.  The cannulas were removed.  The echo showed normal biventricular function.  The patient still had coagulopathy and with preoperative history of Brilinta use, was given 1 unit of platelets and FFP with improved coagulation function.  The mediastinum was irrigated.  The superior pericardial fat was closed over the aorta and vein grafts.  Anterior mediastinal and left pleural chest tubes  were placed and brought through separate incisions.  The sternum was closed with wire.  The patient remained stable.  The pectoralis fascia was closed with a running #1 Vicryl.  The subcutaneous and skin layers were closed in running Vicryl and sterile dressings were applied.  Total cardiopulmonary bypass time was 110 minutes.     Ivin Poot, M.D.     PV/MEDQ  D:  03/26/2017  T:  03/26/2017  Job:  [id number]

## 2017-03-28 ENCOUNTER — Inpatient Hospital Stay (HOSPITAL_COMMUNITY): Payer: Self-pay

## 2017-03-28 LAB — COMPREHENSIVE METABOLIC PANEL
ALT: 106 U/L — ABNORMAL HIGH (ref 14–54)
AST: 33 U/L (ref 15–41)
Albumin: 3.1 g/dL — ABNORMAL LOW (ref 3.5–5.0)
Alkaline Phosphatase: 98 U/L (ref 38–126)
Anion gap: 10 (ref 5–15)
BUN: 17 mg/dL (ref 6–20)
CO2: 26 mmol/L (ref 22–32)
Calcium: 9.1 mg/dL (ref 8.9–10.3)
Chloride: 97 mmol/L — ABNORMAL LOW (ref 101–111)
Creatinine, Ser: 0.61 mg/dL (ref 0.44–1.00)
GFR calc Af Amer: >60 mL/min (ref >60)
GFR calc non Af Amer: >60 mL/min (ref >60)
Glucose, Bld: 116 mg/dL — ABNORMAL HIGH (ref 65–99)
Potassium: 3.8 mmol/L (ref 3.5–5.1)
Sodium: 133 mmol/L — ABNORMAL LOW (ref 135–145)
Total Bilirubin: 0.9 mg/dL (ref 0.3–1.2)
Total Protein: 5.4 g/dL — ABNORMAL LOW (ref 6.5–8.1)

## 2017-03-28 LAB — POCT I-STAT, CHEM 8
BUN: 15 mg/dL (ref 6–20)
CALCIUM ION: 1.21 mmol/L (ref 1.15–1.40)
Chloride: 92 mmol/L — ABNORMAL LOW (ref 101–111)
Creatinine, Ser: 0.5 mg/dL (ref 0.44–1.00)
GLUCOSE: 134 mg/dL — AB (ref 65–99)
HCT: 25 % — ABNORMAL LOW (ref 36.0–46.0)
HEMOGLOBIN: 8.5 g/dL — AB (ref 12.0–15.0)
Potassium: 3.5 mmol/L (ref 3.5–5.1)
SODIUM: 134 mmol/L — AB (ref 135–145)
TCO2: 29 mmol/L (ref 22–32)

## 2017-03-28 LAB — CBC
HCT: 25.8 % — ABNORMAL LOW (ref 36.0–46.0)
Hemoglobin: 8.6 g/dL — ABNORMAL LOW (ref 12.0–15.0)
MCH: 30 pg (ref 26.0–34.0)
MCHC: 33.3 g/dL (ref 30.0–36.0)
MCV: 89.9 fL (ref 78.0–100.0)
Platelets: 167 10*3/uL (ref 150–400)
RBC: 2.87 MIL/uL — ABNORMAL LOW (ref 3.87–5.11)
RDW: 15.6 % — ABNORMAL HIGH (ref 11.5–15.5)
WBC: 9.6 10*3/uL (ref 4.0–10.5)

## 2017-03-28 LAB — GLUCOSE, CAPILLARY
GLUCOSE-CAPILLARY: 86 mg/dL (ref 65–99)
GLUCOSE-CAPILLARY: 73 mg/dL (ref 65–99)
GLUCOSE-CAPILLARY: 66 mg/dL (ref 65–99)
Glucose-Capillary: 120 mg/dL — ABNORMAL HIGH (ref 65–99)
Glucose-Capillary: 104 mg/dL — ABNORMAL HIGH (ref 65–99)
Glucose-Capillary: 79 mg/dL (ref 65–99)

## 2017-03-28 MED ORDER — CYCLOBENZAPRINE HCL 10 MG PO TABS
10.0000 mg | ORAL_TABLET | Freq: Two times a day (BID) | ORAL | Status: DC | PRN
  Administered 2017-03-28 – 2017-03-29 (×2): 10 mg via ORAL
  Filled 2017-03-28 (×2): qty 1

## 2017-03-28 MED ORDER — POTASSIUM CHLORIDE 10 MEQ/50ML IV SOLN
10.0000 meq | INTRAVENOUS | Status: AC | PRN
  Administered 2017-03-28 (×3): 10 meq via INTRAVENOUS
  Filled 2017-03-28 (×3): qty 50

## 2017-03-28 MED ORDER — GUAIFENESIN ER 600 MG PO TB12
600.0000 mg | ORAL_TABLET | Freq: Two times a day (BID) | ORAL | Status: DC
  Administered 2017-03-28 – 2017-03-31 (×7): 600 mg via ORAL
  Filled 2017-03-28 (×7): qty 1

## 2017-03-28 MED ORDER — FENTANYL 25 MCG/HR TD PT72
75.0000 ug | MEDICATED_PATCH | TRANSDERMAL | Status: DC
  Administered 2017-03-28: 75 ug via TRANSDERMAL
  Filled 2017-03-28: qty 3
  Filled 2017-03-28: qty 1

## 2017-03-28 MED ORDER — HYDROMORPHONE HCL 2 MG PO TABS
2.0000 mg | ORAL_TABLET | ORAL | Status: DC | PRN
  Administered 2017-03-28 – 2017-03-31 (×5): 2 mg via ORAL
  Filled 2017-03-28 (×5): qty 1

## 2017-03-28 MED ORDER — POTASSIUM CHLORIDE CRYS ER 20 MEQ PO TBCR
40.0000 meq | EXTENDED_RELEASE_TABLET | Freq: Once | ORAL | Status: AC
  Administered 2017-03-28: 40 meq via ORAL
  Filled 2017-03-28: qty 2

## 2017-03-28 NOTE — Progress Notes (Signed)
Patient ID: Gabrielle Page, female   DOB: 1960-06-11, 57 y.o.   MRN: [id number] TCTS Evening Rounds:  Hemodynamically stable in sinus rhythm.  Excellent diuresis today.  BMET    Component Value Date/Time   NA 134 (L) 03/28/2017 1523   K 3.5 03/28/2017 1523   CL 92 (L) 03/28/2017 1523   CO2 26 03/28/2017 0410   GLUCOSE 134 (H) 03/28/2017 1523   BUN 15 03/28/2017 1523   CREATININE 0.50 03/28/2017 1523   CALCIUM 9.1 03/28/2017 0410   GFRNONAA >60 03/28/2017 0410   GFRAA >60 03/28/2017 0410   Replace K+  Ambulated 3 times.

## 2017-03-28 NOTE — Plan of Care (Signed)
Patient pain under better control. Ambulated in hall x3. Sat up in chair.

## 2017-03-28 NOTE — Progress Notes (Signed)
2 Days Post-Op Procedure(s) (LRB): CORONARY ARTERY BYPASS GRAFTING (CABG) x 3 WITH ENDOSCOPIC HARVESTING OF RIGHT SAPHENOUS VEIN (N/A) TRANSESOPHAGEAL ECHOCARDIOGRAM (TEE) (N/A) Subjective: C/o chest wall pain CXR with edema, low volumes Walked in hall NSR  Objective: Vital signs in last 24 hours: Temp:  [98 F (36.7 C)-98.8 F (37.1 C)] 98.4 F (36.9 C) (02/14 0731) Pulse Rate:  [89] 89 (02/14 0731) Cardiac Rhythm: Normal sinus rhythm (02/14 0400) Resp:  [14-39] 38 (02/14 0731) BP: (91-143)/(53-91) 137/72 (02/14 0731) SpO2:  [92 %-100 %] 100 % (02/14 0731) Arterial Line BP: (138-139)/(60) 139/60 (02/13 0900) Weight:  [155 lb (70.3 kg)] 155 lb (70.3 kg) (02/14 0500)  Hemodynamic parameters for last 24 hours: PAP: (34-38)/(14-18) 38/18 CO:  [5.8 L/min] 5.8 L/min CI:  [3.5 L/min/m2] 3.5 L/min/m2  Intake/Output from previous day: 02/13 0701 - 02/14 0700 In: 2067.2 [P.O.:1560; I.V.:407.2; IV Piggyback:100] Out: 2025 [ASNKN:3976; Chest Tube:280] Intake/Output this shift: No intake/output data recorded.       Exam    General- alert and uncomfortable from pain    Neck- no JVD, no cervical adenopathy palpable, no carotid bruit   Lungs- clear without rales, wheezes   Cor- regular rate and rhythm, no murmur , gallop   Abdomen- soft, non-tender   Extremities - warm, non-tender, minimal edema   Neuro- oriented, appropriate, no focal weakness   Lab Results: Recent Labs    03/27/17 1636 03/27/17 1651 03/28/17 0410  WBC 10.5  --  9.6  HGB 8.9* 9.2* 8.6*  HCT 27.0* 27.0* 25.8*  PLT 166  --  167   BMET:  Recent Labs    03/27/17 1105  03/27/17 1651 03/28/17 0410  NA 133*  --  133* 133*  K 4.0  --  3.9 3.8  CL 100*  --  94* 97*  CO2 23  --   --  26  GLUCOSE 91  --  127* 116*  BUN 14  --  17 17  CREATININE 0.64   < > 0.60 0.61  CALCIUM 9.1  --   --  9.1   < > = values in this interval not displayed.    PT/INR:  Recent Labs    03/26/17 1337  LABPROT 14.6  INR  1.15   ABG    Component Value Date/Time   PHART 7.347 (L) 03/26/2017 1826   HCO3 25.0 03/26/2017 1826   TCO2 26 03/27/2017 1651   ACIDBASEDEF 1.0 03/26/2017 1826   O2SAT 96.0 03/26/2017 1826   CBG (last 3)  Recent Labs    03/27/17 1933 03/27/17 2345 03/28/17 0329  GLUCAP 94 96 120*    Assessment/Plan: S/P Procedure(s) (LRB): CORONARY ARTERY BYPASS GRAFTING (CABG) x 3 WITH ENDOSCOPIC HARVESTING OF RIGHT SAPHENOUS VEIN (N/A) TRANSESOPHAGEAL ECHOCARDIOGRAM (TEE) (N/A) Mobilize Diuresis chang pain meds  keep in ICU for pulmonary status   LOS: 7 days    Gabrielle Page 03/28/2017

## 2017-03-28 NOTE — Plan of Care (Signed)
Pt ambulated in the hall for a total distance of 370 ft.

## 2017-03-29 ENCOUNTER — Inpatient Hospital Stay (HOSPITAL_COMMUNITY): Payer: Self-pay

## 2017-03-29 LAB — GLUCOSE, CAPILLARY
GLUCOSE-CAPILLARY: 91 mg/dL (ref 65–99)
GLUCOSE-CAPILLARY: 62 mg/dL — AB (ref 65–99)
GLUCOSE-CAPILLARY: 90 mg/dL (ref 65–99)
Glucose-Capillary: 84 mg/dL (ref 65–99)
Glucose-Capillary: 65 mg/dL (ref 65–99)
Glucose-Capillary: 100 mg/dL — ABNORMAL HIGH (ref 65–99)

## 2017-03-29 LAB — BPAM RBC
Blood Product Expiration Date: 201902202359
Blood Product Expiration Date: 201902202359
Blood Product Expiration Date: 201902222359
Blood Product Expiration Date: 201902222359
Blood Product Expiration Date: 201902222359
Blood Product Expiration Date: 201902232359
ISSUE DATE / TIME: 201902081008
ISSUE DATE / TIME: 201902120755
ISSUE DATE / TIME: 201902120755
ISSUE DATE / TIME: 201902121017
ISSUE DATE / TIME: 201902142246
Unit Type and Rh: 600
Unit Type and Rh: 600
Unit Type and Rh: 600
Unit Type and Rh: 600
Unit Type and Rh: 600
Unit Type and Rh: 600

## 2017-03-29 LAB — TYPE AND SCREEN
ABO/RH(D): A NEG
Antibody Screen: NEGATIVE
Unit division: 0
Unit division: 0
Unit division: 0
Unit division: 0
Unit division: 0
Unit division: 0

## 2017-03-29 LAB — COMPREHENSIVE METABOLIC PANEL
ALT: 88 U/L — ABNORMAL HIGH (ref 14–54)
AST: 25 U/L (ref 15–41)
Albumin: 3.2 g/dL — ABNORMAL LOW (ref 3.5–5.0)
Alkaline Phosphatase: 110 U/L (ref 38–126)
Anion gap: 12 (ref 5–15)
BUN: 11 mg/dL (ref 6–20)
CO2: 29 mmol/L (ref 22–32)
Calcium: 9.3 mg/dL (ref 8.9–10.3)
Chloride: 94 mmol/L — ABNORMAL LOW (ref 101–111)
Creatinine, Ser: 0.57 mg/dL (ref 0.44–1.00)
GFR calc Af Amer: >60 mL/min (ref >60)
GFR calc non Af Amer: >60 mL/min (ref >60)
Glucose, Bld: 109 mg/dL — ABNORMAL HIGH (ref 65–99)
Potassium: 3.5 mmol/L (ref 3.5–5.1)
Sodium: 135 mmol/L (ref 135–145)
Total Bilirubin: 0.9 mg/dL (ref 0.3–1.2)
Total Protein: 6.3 g/dL — ABNORMAL LOW (ref 6.5–8.1)

## 2017-03-29 LAB — CBC
HCT: 27.3 % — ABNORMAL LOW (ref 36.0–46.0)
Hemoglobin: 9.1 g/dL — ABNORMAL LOW (ref 12.0–15.0)
MCH: 30.4 pg (ref 26.0–34.0)
MCHC: 33.3 g/dL (ref 30.0–36.0)
MCV: 91.3 fL (ref 78.0–100.0)
Platelets: 255 10*3/uL (ref 150–400)
RBC: 2.99 MIL/uL — ABNORMAL LOW (ref 3.87–5.11)
RDW: 15.4 % (ref 11.5–15.5)
WBC: 8 10*3/uL (ref 4.0–10.5)

## 2017-03-29 MED ORDER — SODIUM CHLORIDE 0.9% FLUSH
3.0000 mL | INTRAVENOUS | Status: DC | PRN
Start: 2017-03-29 — End: 2017-03-31

## 2017-03-29 MED ORDER — MOVING RIGHT ALONG BOOK
Freq: Once | Status: AC
  Administered 2017-03-29: 09:00:00
  Filled 2017-03-29: qty 1

## 2017-03-29 MED ORDER — INSULIN ASPART 100 UNIT/ML ~~LOC~~ SOLN: 0.0000 [IU] | Freq: Three times a day (TID) | SUBCUTANEOUS | Status: DC

## 2017-03-29 MED ORDER — SODIUM CHLORIDE 0.9% FLUSH
3.0000 mL | Freq: Two times a day (BID) | INTRAVENOUS | Status: DC
  Administered 2017-03-29 – 2017-03-30 (×4): 3 mL via INTRAVENOUS

## 2017-03-29 MED ORDER — MAGNESIUM HYDROXIDE 400 MG/5ML PO SUSP: 30.0000 mL | Freq: Every day | ORAL | Status: DC | PRN

## 2017-03-29 MED ORDER — METOPROLOL TARTRATE 25 MG PO TABS
25.0000 mg | ORAL_TABLET | Freq: Two times a day (BID) | ORAL | Status: DC
  Administered 2017-03-29 – 2017-03-31 (×4): 25 mg via ORAL
  Filled 2017-03-29 (×4): qty 1

## 2017-03-29 MED ORDER — POTASSIUM CHLORIDE CRYS ER 20 MEQ PO TBCR
40.0000 meq | EXTENDED_RELEASE_TABLET | Freq: Every day | ORAL | Status: DC
  Administered 2017-03-29 – 2017-03-31 (×3): 40 meq via ORAL
  Filled 2017-03-29 (×3): qty 2

## 2017-03-29 MED ORDER — AMIODARONE HCL 200 MG PO TABS
200.0000 mg | ORAL_TABLET | Freq: Two times a day (BID) | ORAL | Status: DC
  Administered 2017-03-29 – 2017-03-31 (×4): 200 mg via ORAL
  Filled 2017-03-29 (×4): qty 1

## 2017-03-29 MED ORDER — SORBITOL 70 % SOLN
45.0000 mL | Freq: Once | Status: AC
  Administered 2017-03-29: 45 mL via ORAL
  Filled 2017-03-29: qty 60

## 2017-03-29 MED ORDER — SODIUM CHLORIDE 0.9 % IV SOLN: 250.0000 mL | INTRAVENOUS | Status: DC | PRN

## 2017-03-29 MED FILL — Lidocaine HCl IV Inj 20 MG/ML: INTRAVENOUS | Qty: 10 | Status: AC

## 2017-03-29 MED FILL — Sodium Chloride IV Soln 0.9%: INTRAVENOUS | Qty: 2000 | Status: AC

## 2017-03-29 MED FILL — Sodium Bicarbonate IV Soln 8.4%: INTRAVENOUS | Qty: 50 | Status: AC

## 2017-03-29 MED FILL — Mannitol IV Soln 20%: INTRAVENOUS | Qty: 500 | Status: AC

## 2017-03-29 MED FILL — Heparin Sodium (Porcine) Inj 1000 Unit/ML: INTRAMUSCULAR | Qty: 20 | Status: AC

## 2017-03-29 MED FILL — Electrolyte-R (PH 7.4) Solution: INTRAVENOUS | Qty: 4000 | Status: AC

## 2017-03-29 NOTE — Progress Notes (Signed)
Patient ambulated in hall approx. 3 total laps the length of the unit hall.

## 2017-03-29 NOTE — Progress Notes (Signed)
Instructed pt in use of Flutter device and IS.  Good effort and technique, able to repeat back instructions accurately.  Encouraged use Q1H w/a.

## 2017-03-29 NOTE — Plan of Care (Signed)
Pt is eliminating greater than 30cc of urine per hour w/o assistance of med.

## 2017-03-29 NOTE — Anesthesia Postprocedure Evaluation (Signed)
Anesthesia Post Note  Patient: Gabrielle Page  Procedure(s) Performed: CORONARY ARTERY BYPASS GRAFTING (CABG) x 3 WITH ENDOSCOPIC HARVESTING OF RIGHT SAPHENOUS VEIN (N/A Chest) TRANSESOPHAGEAL ECHOCARDIOGRAM (TEE) (N/A )     Patient location during evaluation: SICU Anesthesia Type: General Level of consciousness: sedated Pain management: pain level controlled Vital Signs Assessment: post-procedure vital signs reviewed and stable Respiratory status: patient remains intubated per anesthesia plan Cardiovascular status: stable Postop Assessment: no apparent nausea or vomiting Anesthetic complications: no    Last Vitals:  Vitals:   03/29/17 0620 03/29/17 0727  BP: 105/64   Pulse:    Resp: (!) 29   Temp:  36.6 C  SpO2: 100%     Last Pain:  Vitals:   03/29/17 0727  TempSrc: Oral  PainSc:                  Saintclair Schroader,JAMES TERRILL

## 2017-03-29 NOTE — Plan of Care (Signed)
  Progressing Education: Knowledge of General Education information will improve 03/29/2017 0946 - Progressing by Boris Lown, RN Clinical Measurements: Ability to maintain clinical measurements within normal limits will improve 03/29/2017 0946 - Progressing by Boris Lown, RN Will remain free from infection 03/29/2017 0946 - Progressing by Boris Lown, RN Respiratory complications will improve 03/29/2017 0946 - Progressing by Boris Lown, RN Note Weaned to room air today.  Cardiovascular complication will be avoided 03/29/2017 0946 - Progressing by Boris Lown, RN Activity: Risk for activity intolerance will decrease 03/29/2017 0946 - Progressing by Boris Lown, RN Note Able to ambulate in the room without difficulty. Ambulated in hallway.  Pain Managment: General experience of comfort will improve 03/29/2017 0946 - Progressing by Boris Lown, RN Note Controlled with fentanyl patch and PO pain meds. Safety: Ability to remain free from injury will improve 03/29/2017 0946 - Progressing by Boris Lown, RN Education: Knowledge of disease or condition will improve 03/29/2017 0946 - Progressing by Boris Lown, RN Note "Moving right along" book given to patient.

## 2017-03-29 NOTE — Progress Notes (Signed)
3 Days Post-Op Procedure(s) (LRB): CORONARY ARTERY BYPASS GRAFTING (CABG) x 3 WITH ENDOSCOPIC HARVESTING OF RIGHT SAPHENOUS VEIN (N/A) TRANSESOPHAGEAL ECHOCARDIOGRAM (TEE) (N/A) Subjective: Pain much better  CXR shows atelectasis RML but  O2 sats ok - flutter valve, mucinex ordered nsr Ready for tx stepdown Objective: Vital signs in last 24 hours: Temp:  [97.9 F (36.6 C)-98.9 F (37.2 C)] 97.9 F (36.6 C) (02/15 0727) Pulse Rate:  [89] 89 (02/14 0800) Cardiac Rhythm: Normal sinus rhythm (02/15 0400) Resp:  [15-38] 29 (02/15 0620) BP: (93-137)/(62-90) 105/64 (02/15 0620) SpO2:  [91 %-100 %] 100 % (02/15 0620) Weight:  [148 lb 12.8 oz (67.5 kg)] 148 lb 12.8 oz (67.5 kg) (02/15 0500)  Hemodynamic parameters for last 24 hours:    Intake/Output from previous day: 02/14 0701 - 02/15 0700 In: 1350 [P.O.:840; I.V.:360; IV Piggyback:150] Out: 3354 [Urine:3635; Chest Tube:10] Intake/Output this shift: No intake/output data recorded.       Exam    General- alert and comfortable    Neck- no JVD, no cervical adenopathy palpable, no carotid bruit   Lungs- clear without rales, wheezes   Cor- regular rate and rhythm, no murmur , gallop   Abdomen- soft, non-tender   Extremities - warm, non-tender, minimal edema   Neuro- oriented, appropriate, no focal weakness   Lab Results: Recent Labs    03/28/17 0410 03/28/17 1523 03/29/17 0556  WBC 9.6  --  8.0  HGB 8.6* 8.5* 9.1*  HCT 25.8* 25.0* 27.3*  PLT 167  --  255   BMET:  Recent Labs    03/28/17 0410 03/28/17 1523 03/29/17 0556  NA 133* 134* 135  K 3.8 3.5 3.5  CL 97* 92* 94*  CO2 26  --  29  GLUCOSE 116* 134* 109*  BUN 17 15 11   CREATININE 0.61 0.50 0.57  CALCIUM 9.1  --  9.3    PT/INR:  Recent Labs    03/26/17 1337  LABPROT 14.6  INR 1.15   ABG    Component Value Date/Time   PHART 7.347 (L) 03/26/2017 1826   HCO3 25.0 03/26/2017 1826   TCO2 29 03/28/2017 1523   ACIDBASEDEF 1.0 03/26/2017 1826   O2SAT  96.0 03/26/2017 1826   CBG (last 3)  Recent Labs    03/28/17 1633 03/28/17 1909 03/29/17 0506  GLUCAP 104* 79 84    Assessment/Plan: S/P Procedure(s) (LRB): CORONARY ARTERY BYPASS GRAFTING (CABG) x 3 WITH ENDOSCOPIC HARVESTING OF RIGHT SAPHENOUS VEIN (N/A) TRANSESOPHAGEAL ECHOCARDIOGRAM (TEE) (N/A) Mobilize Diuresis Plan for transfer to step-down: see transfer orders Pulmonary toilette for RML atelectasis  LOS: 8 days    Tharon Aquas Trigt III 03/29/2017

## 2017-03-29 NOTE — Plan of Care (Signed)
  Progressing Education: Knowledge of General Education information will improve 03/29/2017 2116 - Progressing by Drenda Freeze, RN Health Behavior/Discharge Planning: Ability to manage health-related needs will improve 03/29/2017 2116 - Progressing by Drenda Freeze, RN

## 2017-03-29 NOTE — Progress Notes (Signed)
Pt arrived to 4e from 2h. Pt oriented to room and staff. Telemetry box placed and CCMD notified x2. Vitals obtained. Pt refused CHG bath because she bathed right before transfer to this unit. Pt in chair with call light within reach. Pt denies needs. Will continue current plan of care.   Grant Fontana BSN, RN

## 2017-03-30 ENCOUNTER — Inpatient Hospital Stay (HOSPITAL_COMMUNITY): Payer: Self-pay

## 2017-03-30 DIAGNOSIS — Z951 Presence of aortocoronary bypass graft: Secondary | ICD-10-CM

## 2017-03-30 LAB — GLUCOSE, CAPILLARY
Glucose-Capillary: 75 mg/dL (ref 65–99)
Glucose-Capillary: 83 mg/dL (ref 65–99)
Glucose-Capillary: 76 mg/dL (ref 65–99)
Glucose-Capillary: 91 mg/dL (ref 65–99)

## 2017-03-30 LAB — BASIC METABOLIC PANEL
Anion gap: 13 (ref 5–15)
BUN: 15 mg/dL (ref 6–20)
CO2: 28 mmol/L (ref 22–32)
Calcium: 9.4 mg/dL (ref 8.9–10.3)
Chloride: 93 mmol/L — ABNORMAL LOW (ref 101–111)
Creatinine, Ser: 0.67 mg/dL (ref 0.44–1.00)
GFR calc Af Amer: >60 mL/min (ref >60)
GFR calc non Af Amer: >60 mL/min (ref >60)
Glucose, Bld: 122 mg/dL — ABNORMAL HIGH (ref 65–99)
Potassium: 4.2 mmol/L (ref 3.5–5.1)
Sodium: 134 mmol/L — ABNORMAL LOW (ref 135–145)

## 2017-03-30 LAB — CBC
HCT: 26.8 % — ABNORMAL LOW (ref 36.0–46.0)
Hemoglobin: 8.9 g/dL — ABNORMAL LOW (ref 12.0–15.0)
MCH: 30.3 pg (ref 26.0–34.0)
MCHC: 33.2 g/dL (ref 30.0–36.0)
MCV: 91.2 fL (ref 78.0–100.0)
Platelets: 341 10*3/uL (ref 150–400)
RBC: 2.94 MIL/uL — ABNORMAL LOW (ref 3.87–5.11)
RDW: 15 % (ref 11.5–15.5)
WBC: 9.2 10*3/uL (ref 4.0–10.5)

## 2017-03-30 NOTE — Progress Notes (Addendum)
CARDIAC REHAB PHASE I   PRE:  Rate/Rhythm: Sinus 90  BP:    Sitting: 91/73 recheck blood pressure 108/64   SaO2: 94% Room Air  MODE:  Ambulation: 800 ft   POST:  Rate/Rhythem: 101  BP:    Sitting: 108/93   SaO2: 97% Room Air  1105-1200 Patient ambulated in the hallway  independently without complaints or difficulty.  Reviewed exercise instructions. Ms Saldierna says she is interested in phase 2 cardiac rehab in [place]. Reviewed heart healthy diet with patient and family member. Discussed sternal precautions and restrictions with the patient.   Harrell Gave RN. BSN

## 2017-03-30 NOTE — Discharge Summary (Signed)
Physician Discharge Summary  Patient ID: Rylee Huestis MRN: [id number] DOB/AGE: Apr 21, 1960 57 y.o.  Admit date: 03/21/2017 Discharge date: 03/31/2017  Admission Diagnoses: Severe coronary artery disease with unstable angina  Discharge Diagnoses:  Active Problems:   Unstable angina (HCC)   Elevated LFTs   S/P CABG x 3   Patient Active Problem List   Diagnosis Date Noted  . 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 Elevated LFTs 03/23/2017  . Unstable angina ([place]) 03/21/2017  . Non-ST elevation (NSTEMI) myocardial infarction ([place]) 11/05/2016  . LBBB (left bundle branch block) 11/05/2016    History of Present Illness:    at time of consultation Patient examined, coronary angiograms images personally reviewed and discussed with patient  57 year old female presents with symptoms of unstable angina and nonspecific EKG changes. She had an anterior MI in September 2018 and her symptoms were similar. At the time of her MI she had a drug-eluting stent placed to the proximal LAD which also partially covered a diagonal branch. She did well until recently when she developed chest pain and shortness of breath with minimal activity. A myocardial nuclear scan showed severe anterior ischemia although enzymes are negative. She is placed on IV heparin and transferred to this hospital and underwent cardiac catheterization. This demonstrated a moderate left main stenosis with a high-grade 80% stenosis of the LAD proximal to the patent stent with a stenosis of the origin of a diagonal branch. She is felt to have left main disease proximal LAD diagonal stenosis and was recommended for CABG. I reviewed her coronary angiogram images and examined the patient and felt that she would 3 appropriate candidate for grafts to the LAD diagonal and OM-I agreed with the recommendation by her cardiologist for CABG. The patient has remained stable on IV heparin. Echocardiogram is pending.  The patient is been on [place] for 6  months which is now held with plans for multivessel CABG on Tuesday, February 12.  The patient has elevated LFTs with previous cholecystectomy and bile duct exploration performed at outside hospital.    Discharged Condition: good  Hospital Course: The patient was admitted with unstable angina.  A Myoview study suggested restenosis of the on DA PT therapy.  Cardiology felt she should undergo repeat Cardiac catheterization which was performed and found to show significant coronary artery disease.  Cardiothoracic surgical consultation was obtained the patient was felt to be a candidate for surgical intervention.  On 03/26/2017 she was taken to the operating room where she underwent the below described procedure.  She tolerated well was taken to the surgical intensive care unit in stable condition.  Postoperative hospital course: The patient is overall progressed nicely.  We have followed LFTs daily.  He did have elevated preoperative transaminases.  They showed steady improvement over time.  She has an expected acute blood loss anemia which is stabilized.  Renal function has remained in the normal range.  She did have some pulmonary congestion postoperatively and required aggressive pulmonary toilet and incentive spirometry.  She showed gradual improvement with this over time.  She is noted to have an elevated right hemidiaphragm.  She did have some postoperative volume overload but is responding to diuretics.  She was transferred to 4 E. on 03/29/2017.  She continued to progress well with her rehabilitation.  Epicardial pacing wires were removed on 03/31/2017.  The patient was felt to be quite stable for discharge on 03/31/2017.  Consults: cardiology  Significant Diagnostic Studies: angiography: Cardiac cath Martinique, Peter M, MD (Primary)  Procedures   Intravascular Ultrasound/IVUS  LEFT HEART CATH AND CORONARY ANGIOGRAPHY  Conclusion     Previously placed Prox LAD to Mid LAD stent (unknown  type) is widely patent.  Ost LAD to Prox LAD lesion is 80% stenosed.  Dist LM lesion is 40% stenosed.  Ost 1st Diag lesion is 85% stenosed.  The left ventricular systolic function is normal.  LV end diastolic pressure is normal.  The left ventricular ejection fraction is 55-65% by visual estimate.   1. Single vessel obstructive CAD. There is progression of disease in the ostium of the LAD. This area was difficult to see angiographically but clearly worse than prior angiogram in September.  IVUS confirms significant stenosis in this area and this correlates with her clinical presentation and Nuclear stress test findings. 2. Normal LV function 3. Normal LVEDP  Plan: Hold Brilinta. Patient will need CABG after Brilinta washout. Will resume IV heparin. If she has recurrent chest pain would start IV Ntg.     Treatments: surgery:   PHYSICIAN:  Ivin Poot, M.D.  DATE OF BIRTH:  09/29/1961  DATE OF PROCEDURE:  03/26/2017 DATE OF DISCHARGE:                              OPERATIVE REPORT   OPERATIONS: 1. Coronary artery bypass grafting x3 (left internal mammary artery to     left anterior descending, saphenous vein graft to diagonal,     saphenous vein graft to circumflex marginal). 2. Endoscopic harvest of right leg greater saphenous vein.  SURGEON:  Ivin Poot, MD.  ASSISTANT:  [name], PA-C.  ANESTHESIA:  General by Dr. Rica Koyanagi.  PREOPERATIVE DIAGNOSES:  Unstable angina, non ST-elevation myocardial infarction, severe multivessel coronary artery disease with left main stenosis, prior percutaneous coronary intervention of the proximal left anterior descending in September 2018.  POSTOPERATIVE DIAGNOSES:  Unstable angina, non ST-elevation myocardial infarction, severe multivessel coronary artery disease with left main stenosis, prior percutaneous coronary intervention of the proximal left anterior descending in September 2018.    Discharge  Exam: Blood pressure 106/67, pulse 84, temperature 98.1 F (36.7 C), temperature source Oral, resp. rate (!) 23, height 5\' 3"  (1.6 m), weight 144 lb 6.4 oz (65.5 kg), SpO2 100 %.   General appearance: alert, cooperative and no distress Heart: regular rate and rhythm Lungs: dim right lower field Abdomen: benign Extremities: no edema Wound: incis healing well    Disposition: 01-Home or Self Care  Discharge Instructions    Amb Referral to Cardiac Rehabilitation   Complete by:  As directed    Diagnosis:  CABG   CABG X ___:  3   Discharge patient   Complete by:  As directed    Discharge disposition:  01-Home or Self Care   Discharge patient date:  03/31/2017     Allergies as of 03/31/2017      Reactions   Compazine [prochlorperazine Edisylate] Other (See Comments)   SEIZURE   Other Other (See Comments)   Steroids cause uncontrollable coughing      Medication List    STOP taking these medications   aspirin 81 MG chewable tablet Replaced by:  aspirin 325 MG EC tablet   isosorbide mononitrate 30 MG 24 hr tablet Commonly known as:  IMDUR   montelukast 10 MG tablet Commonly known as:  SINGULAIR   nitroGLYCERIN 0.4 MG SL tablet Commonly known as:  NITROSTAT   pravastatin 80 MG  tablet Commonly known as:  PRAVACHOL   ticagrelor 90 MG Tabs tablet Commonly known as:  BRILINTA     TAKE these medications   amiodarone 200 MG tablet Commonly known as:  PACERONE Take 1 tablet (200 mg total) by mouth 2 (two) times daily.   aspirin 325 MG EC tablet Take 1 tablet (325 mg total) by mouth daily. Start taking on:  04/01/2017 Replaces:  aspirin 81 MG chewable tablet   HYDROmorphone 2 MG tablet Commonly known as:  DILAUDID Take 1 tablet (2 mg total) by mouth every 6 (six) hours as needed for moderate pain or severe pain.   metoprolol tartrate 25 MG tablet Commonly known as:  LOPRESSOR Take 1 tablet (25 mg total) by mouth 2 (two) times daily. What changed:    medication  strength  how much to take   multivitamin with minerals Tabs tablet Take 1 tablet by mouth daily.      Follow-up [facility] Follow up.   Why:  please call and schedule follow up apt by 3/1 Contact information: [place] [id number] [phone]       Nahser, Wonda Cheng, MD Follow up.   Specialty:  Cardiology Why:  A 2-week cardiology follow-up appointment will be arranged and he will be contacted. Contact information: [place] [address] [phone]        Ivin Poot, MD Follow up.   Specialty:  Cardiothoracic Surgery Why:  The office will contact you about a follow-up appointment with Dr. Darcey Nora.  Please obtain a chest x-ray at [facility] 1/2-hour prior to this.  [facility] is located in the same office complex on the first floor. Contact information: [address] [place] [address] [phone]          The patient has been discharged on:   1.Beta Blocker:  Yes [ y  ]                              No   [   ]                              If No, reason:  2.Ace Inhibitor/ARB: Yes [   ]                                     No  [  n  ]                                     If No, reason:low bp  3.Statin:   Yes [   ]                  No  [n   ]                  If No, reason:elevated LFT's  4.Ecasa:  Yes  [ y  ]                  No   [   ]  If No, reason:  Signed: [name] 03/31/2017, 11:04 AM

## 2017-03-30 NOTE — Progress Notes (Signed)
[address]       ,[place] [address]             [phone]      4 Days Post-Op Procedure(s) (LRB): CORONARY ARTERY BYPASS GRAFTING (CABG) x 3 WITH ENDOSCOPIC HARVESTING OF RIGHT SAPHENOUS VEIN (N/A) TRANSESOPHAGEAL ECHOCARDIOGRAM (TEE) (N/A) Subjective: Feels well overall    Objective: Vital signs in last 24 hours: Temp:  [97.8 F (36.6 C)-98.6 F (37 C)] 98.3 F (36.8 C) (02/16 0800) Pulse Rate:  [95-106] 106 (02/16 0934) Cardiac Rhythm: Normal sinus rhythm (02/16 0700) Resp:  [16-24] 16 (02/16 0800) BP: (99-127)/(63-99) 127/75 (02/16 0934) SpO2:  [91 %-98 %] 94 % (02/16 0800) Weight:  [144 lb 14.4 oz (65.7 kg)] 144 lb 14.4 oz (65.7 kg) (02/16 0515)  Hemodynamic parameters for last 24 hours:    Intake/Output from previous day: 02/15 0701 - 02/16 0700 In: 0  Out: 1025 [Urine:1025] Intake/Output this shift: Total I/O In: 240 [P.O.:240] Out: -   General appearance: alert, cooperative and no distress Heart: regular rate and rhythm Lungs: dim right lower fields Abdomen: benign Extremities: no edema Wound: incis healing well  Lab Results: Recent Labs    03/29/17 0556 03/30/17 0221  WBC 8.0 9.2  HGB 9.1* 8.9*  HCT 27.3* 26.8*  PLT 255 341   BMET:  Recent Labs    03/29/17 0556 03/30/17 0221  NA 135 134*  K 3.5 4.2  CL 94* 93*  CO2 29 28  GLUCOSE 109* 122*  BUN 11 15  CREATININE 0.57 0.67  CALCIUM 9.3 9.4    PT/INR: No results for input(s): LABPROT, INR in the last 72 hours. ABG    Component Value Date/Time   PHART 7.347 (L) 03/26/2017 1826   HCO3 25.0 03/26/2017 1826   TCO2 29 03/28/2017 1523   ACIDBASEDEF 1.0 03/26/2017 1826   O2SAT 96.0 03/26/2017 1826   CBG (last 3)  Recent Labs    03/29/17 1647 03/29/17 2046 03/30/17 0610  GLUCAP 62* 90 75    Meds Scheduled Meds: . amiodarone  200 mg Oral BID  . aspirin EC  325 mg Oral Daily  . bisacodyl  10 mg Oral Daily   Or  . bisacodyl  10 mg Rectal Daily  .  docusate sodium  200 mg Oral Daily  . fentaNYL  75 mcg Transdermal Q72H  . furosemide  40 mg Intravenous BID  . guaiFENesin  600 mg Oral BID  . insulin aspart  0-24 Units Subcutaneous TID AC & HS  . metoprolol tartrate  25 mg Oral BID  . multivitamin with minerals  1 tablet Oral Daily  . mupirocin ointment  1 application Nasal BID  . pantoprazole  40 mg Oral Daily  . potassium chloride  40 mEq Oral Daily  . sodium chloride flush  3 mL Intravenous Q12H   Continuous Infusions: . sodium chloride    . sodium chloride    . desmopressin (DDAVP) IV    . lactated ringers Stopped (03/26/17 1400)   PRN Meds:.sodium chloride, acetaminophen, cyclobenzaprine, HYDROmorphone, magnesium hydroxide, metoprolol tartrate, ondansetron (ZOFRAN) IV, sodium chloride flush, sodium chloride flush, traMADol  Xrays Dg Chest 2 View  Result Date: 03/30/2017 CLINICAL DATA:  CABG 03/26/2017 EXAM: CHEST  2 VIEW COMPARISON:  03/29/2017 FINDINGS: Elevated right hemidiaphragm unchanged. Right pleural effusion and right lower lobe atelectasis unchanged. Mild left lower lobe atelectasis improved from the prior study. Minimal left effusion. Cardiac enlargement.  Mild vascular congestion without edema.  IMPRESSION: Elevated right hemidiaphragm. Right lower lobe atelectasis and effusion unchanged. Improvement in mild left lower lobe atelectasis. Electronically Signed   By: Franchot Gallo M.D.   On: 03/30/2017 09:00   Dg Chest 2 View  Result Date: 03/29/2017 CLINICAL DATA:  CABG EXAM: CHEST  2 VIEW COMPARISON:  03/28/2017 FINDINGS: Central venous catheter removed. Mediastinal drain removed. No pneumothorax. Elevated right hemidiaphragm unchanged.  Bibasilar atelectasis. Improvement in pulmonary edema. Small bilateral pleural effusions have improved. IMPRESSION: Decrease in pulmonary edema. No change in bibasilar atelectasis. Improvement in small pleural effusions. Electronically Signed   By: Franchot Gallo M.D.   On: 03/29/2017  07:33    Assessment/Plan: S/P Procedure(s) (LRB): CORONARY ARTERY BYPASS GRAFTING (CABG) x 3 WITH ENDOSCOPIC HARVESTING OF RIGHT SAPHENOUS VEIN (N/A) TRANSESOPHAGEAL ECHOCARDIOGRAM (TEE) (N/A)   1 doing well 2 elevated right diaphragm RLL atx stable, cont pulm toilet/rehab 3 excellent diuresis - decrease lasix 4 labs stable 5 sinus rhythm and hemodynamically stable 6 poss home 1-2 days   LOS: 9 days    [name] 03/30/2017

## 2017-03-30 NOTE — Discharge Instructions (Signed)
Endoscopic Saphenous Vein Harvesting, Care After °Refer to this sheet in the next few weeks. These instructions provide you with information about caring for yourself after your procedure. Your health care provider may also give you more specific instructions. Your treatment has been planned according to current medical practices, but problems sometimes occur. Call your health care provider if you have any problems or questions after your procedure. °What can I expect after the procedure? °After the procedure, it is common to have: °· Pain. °· Bruising. °· Swelling. °· Numbness. ° °Follow these instructions at home: °Medicine °· Take over-the-counter and prescription medicines only as told by your health care provider. °· Do not drive or operate heavy machinery while taking prescription pain medicine. °Incision care ° °· Follow instructions from your health care provider about how to take care of the cut made during surgery (incision). Make sure you: °? Wash your hands with soap and water before you change your bandage (dressing). If soap and water are not available, use hand sanitizer. °? Change your dressing as told by your health care provider. °? Leave stitches (sutures), skin glue, or adhesive strips in place. These skin closures may need to be in place for 2 weeks or longer. If adhesive strip edges start to loosen and curl up, you may trim the loose edges. Do not remove adhesive strips completely unless your health care provider tells you to do that. °· Check your incision area every day for signs of infection. Check for: °? More redness, swelling, or pain. °? More fluid or blood. °? Warmth. °? Pus or a bad smell. °General instructions °· Raise (elevate) your legs above the level of your heart while you are sitting or lying down. °· Do any exercises your health care providers have given you. These may include deep breathing, coughing, and walking exercises. °· Do not shower, take baths, swim, or use a hot tub  unless told by your health care provider. °· Wear your elastic stocking if told by your health care provider. °· Keep all follow-up visits as told by your health care provider. This is important. °Contact a health care provider if: °· Medicine does not help your pain. °· Your pain gets worse. °· You have new leg bruises or your leg bruises get bigger. °· You have a fever. °· Your leg feels numb. °· You have more redness, swelling, or pain around your incision. °· You have more fluid or blood coming from your incision. °· Your incision feels warm to the touch. °· You have pus or a bad smell coming from your incision. °Get help right away if: °· Your pain is severe. °· You develop pain, tenderness, warmth, redness, or swelling in any part of your leg. °· You have chest pain. °· You have trouble breathing. °This information is not intended to replace advice given to you by your health care provider. Make sure you discuss any questions you have with your health care provider. °Document Released: 10/11/2010 Document Revised: 07/07/2015 Document Reviewed: 12/13/2014 °Elsevier Interactive Patient Education © 2018 Elsevier Inc[employer] °Coronary Artery Bypass Grafting, Care After °These instructions give you information on caring for yourself after your procedure. Your doctor may also give you more specific instructions. Call your doctor if you have any problems or questions after your procedure. °Follow these instructions at home: °· Only take medicine as told by your doctor. Take medicines exactly as told. Do not stop taking medicines or start any new medicines without talking to your doctor first. °·   Take your pulse as told by your doctor. °· Do deep breathing as told by your doctor. Use your breathing device (incentive spirometer), if given, to practice deep breathing several times a day. Support your chest with a pillow or your arms when you take deep breaths or cough. °· Keep the area clean, dry, and protected where the  surgery cuts (incisions) were made. Remove bandages (dressings) only as told by your doctor. If strips were applied to surgical area, do not take them off. They fall off on their own. °· Check the surgery area daily for puffiness (swelling), redness, or leaking fluid. °· If surgery cuts were made in your legs: °? Avoid crossing your legs. °? Avoid sitting for long periods of time. Change positions every 30 minutes. °? Raise your legs when you are sitting. Place them on pillows. °· Wear stockings that help keep blood clots from forming in your legs (compression stockings). °· Only take sponge baths until your doctor says it is okay to take showers. Pat the surgery area dry. Do not rub the surgery area with a washcloth or towel. Do not bathe, swim, or use a hot tub until your doctor says it is okay. °· Eat foods that are high in fiber. These include raw fruits and vegetables, whole grains, beans, and nuts. Choose lean meats. Avoid canned, processed, and fried foods. °· Drink enough fluids to keep your pee (urine) clear or pale yellow. °· Weigh yourself every day. °· Rest and limit activity as told by your doctor. You may be told to: °? Stop any activity if you have chest pain, shortness of breath, changes in heartbeat, or dizziness. Get help right away if this happens. °? Move around often for short amounts of time or take short walks as told by your doctor. Gradually become more active. You may need help to strengthen your muscles and build endurance. °? Avoid lifting, pushing, or pulling anything heavier than 10 pounds (4.5 kg) for at least 6 weeks after surgery. °· Do not drive until your doctor says it is okay. °· Ask your doctor when you can go back to work. °· Ask your doctor when you can begin sexual activity again. °· Follow up with your doctor as told. °Contact a doctor if: °· You have puffiness, redness, more pain, or fluid draining from the incision site. °· You have a fever. °· You have puffiness in your  ankles or legs. °· You have pain in your legs. °· You gain 2 or more pounds (0.9 kg) a day. °· You feel sick to your stomach (nauseous) or throw up (vomit). °· You have watery poop (diarrhea). °Get help right away if: °· You have chest pain that goes to your jaw or arms. °· You have shortness of breath. °· You have a fast or irregular heartbeat. °· You notice a "clicking" in your breastbone when you move. °· You have numbness or weakness in your arms or legs. °· You feel dizzy or light-headed. °This information is not intended to replace advice given to you by your health care provider. Make sure you discuss any questions you have with your health care provider. °Document Released: 02/03/2013 Document Revised: 07/07/2015 Document Reviewed: 07/08/2012 °Elsevier Interactive Patient Education © 2017 Elsevier Inc. ° °

## 2017-03-31 LAB — GLUCOSE, CAPILLARY
Glucose-Capillary: 131 mg/dL — ABNORMAL HIGH (ref 65–99)
Glucose-Capillary: 93 mg/dL (ref 65–99)

## 2017-03-31 MED ORDER — AMIODARONE HCL 200 MG PO TABS: 200.0000 mg | ORAL_TABLET | Freq: Two times a day (BID) | ORAL | 1 refills | Status: DC

## 2017-03-31 MED ORDER — HYDROMORPHONE HCL 2 MG PO TABS: 2.0000 mg | ORAL_TABLET | Freq: Four times a day (QID) | ORAL | 0 refills | Status: DC | PRN

## 2017-03-31 MED ORDER — METOPROLOL TARTRATE 25 MG PO TABS: 25.0000 mg | ORAL_TABLET | Freq: Two times a day (BID) | ORAL | 1 refills | Status: DC

## 2017-03-31 MED ORDER — ASPIRIN 325 MG PO TBEC: 325.0000 mg | DELAYED_RELEASE_TABLET | Freq: Every day | ORAL | Status: DC

## 2017-03-31 NOTE — Progress Notes (Signed)
EPW wires removed per order. VSS. Pt instructed to remain in bed for one hour. Call light in reach. Will continue to monitor.  Clyde Canterbury, RN

## 2017-03-31 NOTE — Progress Notes (Signed)
[address]       [place] [address]             [phone]      5 Days Post-Op Procedure(s) (LRB): CORONARY ARTERY BYPASS GRAFTING (CABG) x 3 WITH ENDOSCOPIC HARVESTING OF RIGHT SAPHENOUS VEIN (N/A) TRANSESOPHAGEAL ECHOCARDIOGRAM (TEE) (N/A) Subjective: Feels well, no new complaints  Objective: Vital signs in last 24 hours: Temp:  [97.6 F (36.4 C)-98.1 F (36.7 C)] 98.1 F (36.7 C) (02/17 0800) Pulse Rate:  [84] 84 (02/16 2141) Cardiac Rhythm: Normal sinus rhythm (02/16 1930) Resp:  [13-26] 23 (02/17 0830) BP: (106-134)/(57-79) 106/67 (02/17 0830) SpO2:  [100 %] 100 % (02/17 0830) Weight:  [144 lb 6.4 oz (65.5 kg)] 144 lb 6.4 oz (65.5 kg) (02/17 0255)  Hemodynamic parameters for last 24 hours:    Intake/Output from previous day: 02/16 0701 - 02/17 0700 In: 360 [P.O.:360] Out: -  Intake/Output this shift: No intake/output data recorded.  General appearance: alert, cooperative and no distress Heart: regular rate and rhythm Lungs: dim right lower field Abdomen: benign Extremities: no edema Wound: incis healing well  Lab Results: Recent Labs    03/29/17 0556 03/30/17 0221  WBC 8.0 9.2  HGB 9.1* 8.9*  HCT 27.3* 26.8*  PLT 255 341   BMET:  Recent Labs    03/29/17 0556 03/30/17 0221  NA 135 134*  K 3.5 4.2  CL 94* 93*  CO2 29 28  GLUCOSE 109* 122*  BUN 11 15  CREATININE 0.57 0.67  CALCIUM 9.3 9.4    PT/INR: No results for input(s): LABPROT, INR in the last 72 hours. ABG    Component Value Date/Time   PHART 7.347 (L) 03/26/2017 1826   HCO3 25.0 03/26/2017 1826   TCO2 29 03/28/2017 1523   ACIDBASEDEF 1.0 03/26/2017 1826   O2SAT 96.0 03/26/2017 1826   CBG (last 3)  Recent Labs    03/30/17 1643 03/30/17 2205 03/31/17 0638  GLUCAP 76 91 131*    Meds Scheduled Meds: . amiodarone  200 mg Oral BID  . aspirin EC  325 mg Oral Daily  . bisacodyl  10 mg Oral Daily   Or  . bisacodyl  10 mg Rectal Daily  . docusate  sodium  200 mg Oral Daily  . fentaNYL  75 mcg Transdermal Q72H  . furosemide  40 mg Intravenous BID  . guaiFENesin  600 mg Oral BID  . insulin aspart  0-24 Units Subcutaneous TID AC & HS  . metoprolol tartrate  25 mg Oral BID  . multivitamin with minerals  1 tablet Oral Daily  . pantoprazole  40 mg Oral Daily  . potassium chloride  40 mEq Oral Daily  . sodium chloride flush  3 mL Intravenous Q12H   Continuous Infusions: . sodium chloride    . sodium chloride    . desmopressin (DDAVP) IV    . lactated ringers Stopped (03/26/17 1400)   PRN Meds:.sodium chloride, acetaminophen, cyclobenzaprine, HYDROmorphone, magnesium hydroxide, metoprolol tartrate, ondansetron (ZOFRAN) IV, sodium chloride flush, sodium chloride flush, traMADol  Xrays Dg Chest 2 View  Result Date: 03/30/2017 CLINICAL DATA:  CABG 03/26/2017 EXAM: CHEST  2 VIEW COMPARISON:  03/29/2017 FINDINGS: Elevated right hemidiaphragm unchanged. Right pleural effusion and right lower lobe atelectasis unchanged. Mild left lower lobe atelectasis improved from the prior study. Minimal left effusion. Cardiac enlargement.  Mild vascular congestion without edema. IMPRESSION: Elevated right hemidiaphragm. Right lower lobe atelectasis and effusion unchanged. Improvement in mild  left lower lobe atelectasis. Electronically Signed   By: Franchot Gallo M.D.   On: 03/30/2017 09:00    Assessment/Plan: S/P Procedure(s) (LRB): CORONARY ARTERY BYPASS GRAFTING (CABG) x 3 WITH ENDOSCOPIC HARVESTING OF RIGHT SAPHENOUS VEIN (N/A) TRANSESOPHAGEAL ECHOCARDIOGRAM (TEE) (N/A) Plan for discharge: see discharge orders   LOS: 10 days    Gabrielle Page 03/31/2017

## 2017-03-31 NOTE — Progress Notes (Signed)
Attempted to order Going Home After Cardiac Surgery education video for patient. Eduction videos not working. Reviewed booklet with patient and answered questions.  Clyde Canterbury, RN

## 2017-03-31 NOTE — Care Management Note (Signed)
Case Management Note Previous CM note completed by Zenon Mayo, RN 03/27/2017, 7:04 PM   Patient Details  Name: Gabrielle Page MRN: [id number] Date of Birth: 07-Jun-1960  Subjective/Objective:    Per previous NCM note  2/11 by Jacqlyn Krauss -Pt presented for angina. S/p cath 03-22-17-plan for CABG 03-26-17 post Brilinta Washout. Pt is without insurance and gets her [facility]. Pt will need an appointment for PCP once stable for d/c.   2/13 [place] RN, BSN-from home with boyfriend, pta indep, POD 1 CABG, conts with chest tube to suction, conts on iv abx, iv lasix, iv pain meds, nitro drip. Plan is home when stable.  2/14 [address], BSN- NCM tried to schedule hospital follow up apt, but [facility] not taking any apts til 3/1 and [facility] not taking apts til 3/1.                      Action/Plan: NCM will follow for transition of care.   Expected Discharge Date:  03/31/17               Expected Discharge Plan:  Home/Self Care  In-House Referral:  NA  Discharge planning Services  CM Consult, Medication Assistance  Post Acute Care Choice:  NA Choice offered to:  NA  DME Arranged:    DME Agency:     HH Arranged:    HH Agency:     Status of Service:  Completed, signed off  If discussed at [employer] of Stay Meetings, dates discussed:    Additional Comments:  03/31/17- 1200- Waino Mounsey RN, CM- pt for d/c home today, spoke with pt at bedside, discussed f/u with [facility]- pt to call on 3/1 to see about scheduling f/u appointment- also reviewed d/c medications pt states she thinks she will be ok with cost of meds- she usually goes to [facility] to fill- advised pt that if she gets to pharmacy and cost if more than she expected to please call CM back so that she can be assisted with MATCH for her discharge medications- pt voices understanding.   Dawayne Patricia, RN 03/31/2017, 12:12 PM

## 2017-03-31 NOTE — Progress Notes (Signed)
D/c instructions and prescriptions given to patient. Chest tube sutures removed, steri-strips placed. Fentanyl patch removed. IV d/c, clean and intact. Telemetry removed.  Clyde Canterbury, RN

## 2017-04-02 ENCOUNTER — Ambulatory Visit: Payer: Medicaid Other | Admitting: Physician Assistant

## 2017-04-02 NOTE — Progress Notes (Deleted)
Cardiology Office Note    Date:  04/02/2017   ID:  Gabrielle Page, DOB 1961-02-08, MRN [id number]  PCP:  [name], NP  Cardiologist:  Dr. Martinique  No chief complaint on file.   History of Present Illness:  Gabrielle Page is a 57 y.o. female with no prior cardiac history who recently presented to the hospital on 11/05/2016 with chest pain. EKG showed sinus rhythm with new left bundle-branch block. Troponin came back elevated, she was admitted for NSTEMI. Cardiac catheterization performed on 11/05/2016 showed 25% ostial left main, 90% proximal to mid LAD treated with 3.5 x 20 mm Promus Premiere DES, EF 55-65%. Initial hemoglobin was 9.5. Lipid panel obtained on 11/06/2016 showed HDL 36, LDL 54, triglyceride 211, total cholesterol 132. Hemoglobin A1c was 5.3. Based on Hospital record, there was incomplete and mid stent expansion although dilated aggressively with noncompliant balloon at high pressure. There was also some mild disruption at the distal stent margin with no flow limitation. Diagonal was jailed but had good flow.    Past Medical History:  Diagnosis Date  . Asthma   . Chronic bronchitis ([place])    "usually get it q yr" (11/05/2016)  . GERD (gastroesophageal reflux disease)   . Headache    "weekly" (11/05/2016)  . Heart murmur    "dx'd when I was a child"  . Hepatitis 1991   "when I had my gallbladder out" (11/05/2016)  . History of blood transfusion 1985   S/P childbirth  . History of kidney stones   . NSTEMI (non-ST elevated myocardial infarction) ([place]) 11/05/2016   9/18 PCI/DESx1 to p/mLAD  . Pancreatitis 1991  . Pneumonia    "twice" (11/05/2016)    Past Surgical History:  Procedure Laterality Date  . ABDOMINAL HYSTERECTOMY  1990  . APPENDECTOMY  1992  . BILE DUCT EXPLORATION  1991   "reconstruction"  . CESAREAN SECTION  1985  . CHOLECYSTECTOMY OPEN  1991  . CORONARY ANGIOPLASTY WITH STENT PLACEMENT  11/05/2016  . CORONARY ARTERY BYPASS GRAFT N/A  03/26/2017   Procedure: CORONARY ARTERY BYPASS GRAFTING (CABG) x 3 WITH ENDOSCOPIC HARVESTING OF RIGHT SAPHENOUS VEIN;  Surgeon: Ivin Poot, MD;  Location: [facility];  Service: Open Heart Surgery;  Laterality: N/A;  . CORONARY STENT INTERVENTION N/A 11/05/2016   Procedure: CORONARY STENT INTERVENTION;  Surgeon: Martinique, Peter M, MD;  Location: [facility];  Service: Cardiovascular;  Laterality: N/A;  . CYSTOSCOPY WITH STENT PLACEMENT  "3 times" (11/05/2016)  . DILATION AND CURETTAGE OF UTERUS  1989  . INTRAVASCULAR ULTRASOUND/IVUS N/A 03/22/2017   Procedure: Intravascular Ultrasound/IVUS;  Surgeon: Martinique, Peter M, MD;  Location: [facility];  Service: Cardiovascular;  Laterality: N/A;  . KNEE ARTHROSCOPY Right 2000s  . LEFT HEART CATH AND CORONARY ANGIOGRAPHY N/A 11/05/2016   Procedure: LEFT HEART CATH AND CORONARY ANGIOGRAPHY;  Surgeon: Martinique, Peter M, MD;  Location: [facility];  Service: Cardiovascular;  Laterality: N/A;  . LEFT HEART CATH AND CORONARY ANGIOGRAPHY N/A 03/22/2017   Procedure: LEFT HEART CATH AND CORONARY ANGIOGRAPHY;  Surgeon: Martinique, Peter M, MD;  Location: [facility];  Service: Cardiovascular;  Laterality: N/A;  . NASAL SINUS SURGERY  ~ 2009   "exposed to mildew; had to have the infectoin drained; cut the outside of my face"  . OVARIAN CYST SURGERY  X 6  . TEE WITHOUT CARDIOVERSION N/A 03/26/2017   Procedure: TRANSESOPHAGEAL ECHOCARDIOGRAM (TEE);  Surgeon: Prescott Gum, [name], MD;  Location: [facility];  Service: Open Heart Surgery;  Laterality: N/A;    Current Medications: Outpatient Medications Prior to Visit  Medication Sig Dispense Refill  . amiodarone (PACERONE) 200 MG tablet Take 1 tablet (200 mg total) by mouth 2 (two) times daily. 60 tablet 1  . aspirin EC 325 MG EC tablet Take 1 tablet (325 mg total) by mouth daily.    Marland Kitchen HYDROmorphone (DILAUDID) 2 MG tablet Take 1 tablet (2 mg total) by mouth every 6 (six) hours as needed for moderate pain or  severe pain. 30 tablet 0  . metoprolol tartrate (LOPRESSOR) 25 MG tablet Take 1 tablet (25 mg total) by mouth 2 (two) times daily. 60 tablet 1  . Multiple Vitamin (MULTIVITAMIN WITH MINERALS) TABS tablet Take 1 tablet by mouth daily.     No facility-administered medications prior to visit.      Allergies:   Compazine [prochlorperazine edisylate] and Other   Social History   Socioeconomic History  . Marital status: Divorced    Spouse name: Not on file  . Number of children: Not on file  . Years of education: Not on file  . Highest education level: Not on file  Social Needs  . Financial resource strain: Not on file  . Food insecurity - worry: Not on file  . Food insecurity - inability: Not on file  . Transportation needs - medical: Not on file  . Transportation needs - non-medical: Not on file  Occupational History  . Not on file  Tobacco Use  . Smoking status: Never Smoker  . Smokeless tobacco: Never Used  Substance and Sexual Activity  . Alcohol use: Yes    Comment: 11/05/2016 "maybe 1 glass of wine/month"  . Drug use: No  . Sexual activity: Not Currently  Other Topics Concern  . Not on file  Social History Narrative  . Not on file     Family History:  The patient's ***family history includes Alcohol abuse in her father; Diabetes Mellitus II in her father; Heart attack in her father; Hypertension in her father; Suicidality in her mother.   ROS:   Please see the history of present illness.    ROS All other systems reviewed and are negative.   PHYSICAL EXAM:   VS:  There were no vitals taken for this visit.   GEN: Well nourished, well developed, in no acute distress  HEENT: normal  Neck: no JVD, carotid bruits, or masses Cardiac: ***RRR; no murmurs, rubs, or gallops,no edema  Respiratory:  clear to auscultation bilaterally, normal work of breathing GI: soft, nontender, nondistended, + BS MS: no deformity or atrophy  Skin: warm and dry, no rash Neuro:  Alert and  Oriented x 3, Strength and sensation are intact Psych: euthymic mood, full affect  Wt Readings from Last 3 Encounters:  03/31/17 144 lb 6.4 oz (65.5 kg)  11/28/16 138 lb 12.8 oz (63 kg)  11/07/16 139 lb 1.8 oz (63.1 kg)      Studies/Labs Reviewed:   EKG:  EKG is*** ordered today.  The ekg ordered today demonstrates ***  Recent Labs: 03/23/2017: TSH 1.353 03/27/2017: Magnesium 1.9 03/29/2017: ALT 88 03/30/2017: BUN 15; Creatinine, Ser 0.67; Hemoglobin 8.9; Platelets 341; Potassium 4.2; Sodium 134   Lipid Panel    Component Value Date/Time   CHOL 132 11/06/2016 0018   TRIG 211 (H) 11/06/2016 0018   HDL 36 (L) 11/06/2016 0018   CHOLHDL 3.7 11/06/2016 0018   VLDL 42 (H) 11/06/2016 0018   LDLCALC 54 11/06/2016 0018  Additional studies/ records that were reviewed today include:   Cath 11/05/2016 Conclusion     Ost LM to LM lesion, 25 %stenosed.  The left ventricular systolic function is normal.  LV end diastolic pressure is normal.  The left ventricular ejection fraction is 55-65% by visual estimate.  A STENT PROMUS PREM MR [id number] drug eluting stent was successfully placed.  Prox LAD to Mid LAD lesion, 90 %stenosed.  Post intervention, there is a 0% residual stenosis.  1. 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 Normal LV function 3. Normal LVEDP 4. Successful stenting of the proximal to mid LAD with DES    CABG2/01/2018 PROCEDURE:  Procedure(s): CORONARY ARTERY BYPASS GRAFTING (CABG) x 3 WITH ENDOSCOPIC HARVESTING OF RIGHT SAPHENOUS VEIN (N/A) TRANSESOPHAGEAL ECHOCARDIOGRAM (TEE) (N/A) 1. Coronary artery bypass grafting x3 (left internal mammary artery to     left anterior descending, saphenous vein graft to diagonal,     saphenous vein graft to circumflex marginal). 2. Endoscopic harvest of right leg greater saphenous vein.      ASSESSMENT:    No diagnosis found.   PLAN:  In order of problems listed  above:  1. ***    Medication Adjustments/Labs and Tests Ordered: Current medicines are reviewed at length with the patient today.  Concerns regarding medicines are outlined above.  Medication changes, Labs and Tests ordered today are listed in the Patient Instructions below. There are no Patient Instructions on file for this visit.   Hilbert Corrigan, [place]  04/02/2017 8:16 AM    [facility] [place], [place], [place]  [address] Phone: [phone]; Fax: [phone]

## 2017-04-03 ENCOUNTER — Encounter: Payer: Self-pay | Admitting: *Deleted

## 2017-04-07 ENCOUNTER — Encounter (HOSPITAL_COMMUNITY): Payer: Self-pay | Admitting: Emergency Medicine

## 2017-04-07 ENCOUNTER — Emergency Department (HOSPITAL_COMMUNITY): Payer: Medicaid Other

## 2017-04-07 ENCOUNTER — Other Ambulatory Visit: Payer: Self-pay

## 2017-04-07 ENCOUNTER — Inpatient Hospital Stay (HOSPITAL_COMMUNITY)
Admission: EM | Admit: 2017-04-07 | Discharge: 2017-04-10 | DRG: 314 | Disposition: A | Discharge status: Home / Self Care | Payer: Self-pay | Attending: Internal Medicine | Admitting: Internal Medicine

## 2017-04-07 DIAGNOSIS — J90 Pleural effusion, not elsewhere classified: Secondary | ICD-10-CM | POA: Diagnosis present

## 2017-04-07 DIAGNOSIS — J181 Lobar pneumonia, unspecified organism: Secondary | ICD-10-CM | POA: Diagnosis present

## 2017-04-07 DIAGNOSIS — Z7982 Long term (current) use of aspirin: Secondary | ICD-10-CM

## 2017-04-07 DIAGNOSIS — J440 Chronic obstructive pulmonary disease with acute lower respiratory infection: Secondary | ICD-10-CM | POA: Diagnosis present

## 2017-04-07 DIAGNOSIS — K219 Gastro-esophageal reflux disease without esophagitis: Secondary | ICD-10-CM | POA: Diagnosis present

## 2017-04-07 DIAGNOSIS — R109 Unspecified abdominal pain: Secondary | ICD-10-CM | POA: Diagnosis present

## 2017-04-07 DIAGNOSIS — Z9049 Acquired absence of other specified parts of digestive tract: Secondary | ICD-10-CM

## 2017-04-07 DIAGNOSIS — J9811 Atelectasis: Secondary | ICD-10-CM | POA: Diagnosis present

## 2017-04-07 DIAGNOSIS — I2510 Atherosclerotic heart disease of native coronary artery without angina pectoris: Secondary | ICD-10-CM | POA: Diagnosis present

## 2017-04-07 DIAGNOSIS — I252 Old myocardial infarction: Secondary | ICD-10-CM

## 2017-04-07 DIAGNOSIS — M25512 Pain in left shoulder: Secondary | ICD-10-CM

## 2017-04-07 DIAGNOSIS — I313 Pericardial effusion (noninflammatory): Principal | ICD-10-CM | POA: Diagnosis present

## 2017-04-07 DIAGNOSIS — Z79899 Other long term (current) drug therapy: Secondary | ICD-10-CM

## 2017-04-07 DIAGNOSIS — Z951 Presence of aortocoronary bypass graft: Secondary | ICD-10-CM

## 2017-04-07 DIAGNOSIS — Z87442 Personal history of urinary calculi: Secondary | ICD-10-CM

## 2017-04-07 DIAGNOSIS — R079 Chest pain, unspecified: Secondary | ICD-10-CM | POA: Diagnosis present

## 2017-04-07 DIAGNOSIS — I3139 Other pericardial effusion (noninflammatory): Secondary | ICD-10-CM | POA: Diagnosis present

## 2017-04-07 DIAGNOSIS — I25119 Atherosclerotic heart disease of native coronary artery with unspecified angina pectoris: Secondary | ICD-10-CM | POA: Diagnosis present

## 2017-04-07 DIAGNOSIS — Z955 Presence of coronary angioplasty implant and graft: Secondary | ICD-10-CM

## 2017-04-07 LAB — CBC
HEMATOCRIT: 30.9 % — AB (ref 36.0–46.0)
Hemoglobin: 9.9 g/dL — ABNORMAL LOW (ref 12.0–15.0)
MCH: 29.6 pg (ref 26.0–34.0)
MCHC: 32 g/dL (ref 30.0–36.0)
MCV: 92.2 fL (ref 78.0–100.0)
PLATELETS: 766 10*3/uL — AB (ref 150–400)
RBC: 3.35 MIL/uL — ABNORMAL LOW (ref 3.87–5.11)
RDW: 16.2 % — AB (ref 11.5–15.5)
WBC: 15.9 10*3/uL — ABNORMAL HIGH (ref 4.0–10.5)

## 2017-04-07 LAB — BASIC METABOLIC PANEL
Anion gap: 14 (ref 5–15)
BUN: 19 mg/dL (ref 6–20)
CO2: 21 mmol/L — ABNORMAL LOW (ref 22–32)
CREATININE: 0.86 mg/dL (ref 0.44–1.00)
Calcium: 9 mg/dL (ref 8.9–10.3)
Chloride: 100 mmol/L — ABNORMAL LOW (ref 101–111)
GFR calc Af Amer: >60 mL/min (ref >60)
GLUCOSE: 123 mg/dL — AB (ref 65–99)
POTASSIUM: 3.8 mmol/L (ref 3.5–5.1)
Sodium: 135 mmol/L (ref 135–145)

## 2017-04-07 LAB — I-STAT TROPONIN, ED: Troponin i, poc: 0.03 ng/mL (ref 0.00–0.08)

## 2017-04-07 NOTE — ED Triage Notes (Signed)
Pt arrived [facility] for midsternal CP with radiation to both arms. Pt was recently here and recently admitted for surgery for multivessel disease. BP124/76 P70SR 20RAC 96% RA 98% O2 29mcg fentanyl given en route brought pain from 10 to a 5 but pain has since returned.

## 2017-04-08 ENCOUNTER — Emergency Department (HOSPITAL_COMMUNITY): Payer: Medicaid Other

## 2017-04-08 ENCOUNTER — Observation Stay (HOSPITAL_COMMUNITY): Payer: Medicaid Other

## 2017-04-08 ENCOUNTER — Other Ambulatory Visit: Payer: Self-pay

## 2017-04-08 ENCOUNTER — Encounter (HOSPITAL_COMMUNITY): Payer: Self-pay | Admitting: General Practice

## 2017-04-08 DIAGNOSIS — Z951 Presence of aortocoronary bypass graft: Secondary | ICD-10-CM

## 2017-04-08 DIAGNOSIS — I25119 Atherosclerotic heart disease of native coronary artery with unspecified angina pectoris: Secondary | ICD-10-CM

## 2017-04-08 DIAGNOSIS — J181 Lobar pneumonia, unspecified organism: Secondary | ICD-10-CM | POA: Diagnosis present

## 2017-04-08 DIAGNOSIS — I2510 Atherosclerotic heart disease of native coronary artery without angina pectoris: Secondary | ICD-10-CM

## 2017-04-08 DIAGNOSIS — I313 Pericardial effusion (noninflammatory): Secondary | ICD-10-CM

## 2017-04-08 DIAGNOSIS — R109 Unspecified abdominal pain: Secondary | ICD-10-CM | POA: Diagnosis present

## 2017-04-08 DIAGNOSIS — I3139 Other pericardial effusion (noninflammatory): Secondary | ICD-10-CM | POA: Diagnosis present

## 2017-04-08 DIAGNOSIS — K219 Gastro-esophageal reflux disease without esophagitis: Secondary | ICD-10-CM | POA: Diagnosis present

## 2017-04-08 DIAGNOSIS — R079 Chest pain, unspecified: Secondary | ICD-10-CM

## 2017-04-08 HISTORY — DX: Atherosclerotic heart disease of native coronary artery with unspecified angina pectoris: I25.119

## 2017-04-08 HISTORY — DX: Pericardial effusion (noninflammatory): I31.3

## 2017-04-08 HISTORY — DX: Unspecified abdominal pain: R10.9

## 2017-04-08 HISTORY — DX: Other pericardial effusion (noninflammatory): I31.39

## 2017-04-08 LAB — URINALYSIS, ROUTINE W REFLEX MICROSCOPIC
Bilirubin Urine: NEGATIVE
Glucose, UA: NEGATIVE mg/dL
Hgb urine dipstick: NEGATIVE
Ketones, ur: NEGATIVE mg/dL
Leukocytes, UA: NEGATIVE
Nitrite: NEGATIVE
Protein, ur: NEGATIVE mg/dL
Specific Gravity, Urine: >1.046 — ABNORMAL HIGH (ref 1.005–1.030)
pH: 6 (ref 5.0–8.0)

## 2017-04-08 LAB — ECHOCARDIOGRAM COMPLETE
AOASC: 30 cm
Area-P 1/2: 2.86 cm2
E decel time: 264 msec
FS: 30 % (ref 28–44)
Height: 63 in
IV/PV OW: 1.2
LA diam end sys: 43 mm
LA diam index: 2.5 cm/m2
LASIZE: 43 mm
LDCA: 2.54 cm2
LV PW d: 10.9 mm — AB (ref 0.6–1.1)
LVOT diameter: 18 mm
MV Dec: 264
MV pk E vel: 107 m/s
MVPG: 5 mmHg
MVPKAVEL: 103 m/s
P 1/2 time: 77 ms
Reg peak vel: 242 cm/s
TRMAXVEL: 242 cm/s
Weight: 2304 oz

## 2017-04-08 LAB — PROCALCITONIN: Procalcitonin: <0.1 ng/mL

## 2017-04-08 LAB — INFLUENZA PANEL BY PCR (TYPE A & B)
INFLAPCR: NEGATIVE
INFLBPCR: NEGATIVE

## 2017-04-08 LAB — TROPONIN I
TROPONIN I: 0.07 ng/mL — AB (ref <0.03)
TROPONIN I: 0.05 ng/mL — AB (ref <0.03)
Troponin I: 0.06 ng/mL (ref <0.03)

## 2017-04-08 LAB — APTT: aPTT: 32 seconds (ref 24–36)

## 2017-04-08 LAB — LACTIC ACID, PLASMA
Lactic Acid, Venous: 1.7 mmol/L (ref 0.5–1.9)
Lactic Acid, Venous: 1.3 mmol/L (ref 0.5–1.9)

## 2017-04-08 LAB — PROTIME-INR
INR: 1.16
PROTHROMBIN TIME: 14.7 s (ref 11.4–15.2)

## 2017-04-08 LAB — LIPID PANEL
CHOLESTEROL: 75 mg/dL (ref 0–200)
HDL: 20 mg/dL — ABNORMAL LOW (ref >40)
LDL Cholesterol: 34 mg/dL (ref 0–99)
TRIGLYCERIDES: 107 mg/dL (ref <150)
Total CHOL/HDL Ratio: 3.8 RATIO
VLDL: 21 mg/dL (ref 0–40)

## 2017-04-08 LAB — HIV ANTIBODY (ROUTINE TESTING W REFLEX): HIV Screen 4th Generation wRfx: NONREACTIVE

## 2017-04-08 LAB — LIPASE, BLOOD: Lipase: 20 U/L (ref 11–51)

## 2017-04-08 LAB — MRSA PCR SCREENING: MRSA by PCR: NEGATIVE

## 2017-04-08 LAB — GLUCOSE, CAPILLARY: GLUCOSE-CAPILLARY: 62 mg/dL — AB (ref 65–99)

## 2017-04-08 LAB — STREP PNEUMONIAE URINARY ANTIGEN: Strep Pneumo Urinary Antigen: NEGATIVE

## 2017-04-08 MED ORDER — ALPRAZOLAM 0.25 MG PO TABS
0.2500 mg | ORAL_TABLET | Freq: Two times a day (BID) | ORAL | Status: DC | PRN
Start: 2017-04-08 — End: 2017-04-10
  Administered 2017-04-08 – 2017-04-09 (×2): 0.25 mg via ORAL
  Filled 2017-04-08 (×2): qty 1

## 2017-04-08 MED ORDER — BLISTEX MEDICATED EX OINT
1.0000 "application " | TOPICAL_OINTMENT | Freq: Once | CUTANEOUS | Status: AC
  Administered 2017-04-08: 1 via TOPICAL
  Filled 2017-04-08: qty 6.3

## 2017-04-08 MED ORDER — ALBUTEROL SULFATE (2.5 MG/3ML) 0.083% IN NEBU: 2.5000 mg | INHALATION_SOLUTION | RESPIRATORY_TRACT | Status: DC | PRN

## 2017-04-08 MED ORDER — HYDROMORPHONE HCL 1 MG/ML IJ SOLN
1.0000 mg | Freq: Once | INTRAMUSCULAR | Status: AC
  Administered 2017-04-08: 1 mg via INTRAVENOUS
  Filled 2017-04-08: qty 1

## 2017-04-08 MED ORDER — OXYCODONE HCL 5 MG PO TABS
5.0000 mg | ORAL_TABLET | ORAL | Status: DC | PRN
  Administered 2017-04-08: 5 mg via ORAL
  Filled 2017-04-08: qty 1

## 2017-04-08 MED ORDER — KETOROLAC TROMETHAMINE 15 MG/ML IJ SOLN
15.0000 mg | Freq: Four times a day (QID) | INTRAMUSCULAR | Status: AC | PRN
  Administered 2017-04-08 – 2017-04-09 (×2): 15 mg via INTRAVENOUS
  Filled 2017-04-08 (×2): qty 1

## 2017-04-08 MED ORDER — LEVOFLOXACIN IN D5W 750 MG/150ML IV SOLN: 750.0000 mg | INTRAVENOUS | Status: DC

## 2017-04-08 MED ORDER — ZOLPIDEM TARTRATE 5 MG PO TABS
5.0000 mg | ORAL_TABLET | Freq: Every evening | ORAL | Status: DC | PRN
  Administered 2017-04-09 (×2): 5 mg via ORAL
  Filled 2017-04-08 (×2): qty 1

## 2017-04-08 MED ORDER — PANTOPRAZOLE SODIUM 40 MG PO TBEC: 40.0000 mg | DELAYED_RELEASE_TABLET | Freq: Every day | ORAL | Status: DC | PRN

## 2017-04-08 MED ORDER — SODIUM CHLORIDE 0.9 % IV BOLUS (SEPSIS)
1000.0000 mL | Freq: Once | INTRAVENOUS | Status: AC
  Administered 2017-04-08: 1000 mL via INTRAVENOUS

## 2017-04-08 MED ORDER — VANCOMYCIN HCL 10 G IV SOLR
1250.0000 mg | Freq: Once | INTRAVENOUS | Status: AC
  Administered 2017-04-08: 1250 mg via INTRAVENOUS
  Filled 2017-04-08: qty 1250

## 2017-04-08 MED ORDER — LEVOFLOXACIN IN D5W 750 MG/150ML IV SOLN
750.0000 mg | Freq: Once | INTRAVENOUS | Status: AC
  Administered 2017-04-08: 750 mg via INTRAVENOUS
  Filled 2017-04-08: qty 150

## 2017-04-08 MED ORDER — HEPARIN SODIUM (PORCINE) 5000 UNIT/ML IJ SOLN
5000.0000 [IU] | Freq: Three times a day (TID) | INTRAMUSCULAR | Status: DC
  Administered 2017-04-08 – 2017-04-10 (×6): 5000 [IU] via SUBCUTANEOUS
  Filled 2017-04-08 (×6): qty 1

## 2017-04-08 MED ORDER — MORPHINE SULFATE (PF) 4 MG/ML IV SOLN: 4.0000 mg | Freq: Once | INTRAVENOUS | Status: DC

## 2017-04-08 MED ORDER — METHOCARBAMOL 1000 MG/10ML IJ SOLN
500.0000 mg | Freq: Once | INTRAMUSCULAR | Status: AC
  Administered 2017-04-08: 500 mg via INTRAVENOUS
  Filled 2017-04-08: qty 5

## 2017-04-08 MED ORDER — HYDROMORPHONE HCL 2 MG PO TABS
2.0000 mg | ORAL_TABLET | Freq: Four times a day (QID) | ORAL | Status: DC | PRN
Start: 2017-04-08 — End: 2017-04-10
  Administered 2017-04-09 (×3): 2 mg via ORAL
  Filled 2017-04-08 (×3): qty 1

## 2017-04-08 MED ORDER — IBUPROFEN 200 MG PO TABS
400.0000 mg | ORAL_TABLET | Freq: Four times a day (QID) | ORAL | Status: DC | PRN
  Administered 2017-04-08 – 2017-04-10 (×4): 400 mg via ORAL
  Filled 2017-04-08 (×2): qty 2
  Filled 2017-04-08: qty 1
  Filled 2017-04-08: qty 2

## 2017-04-08 MED ORDER — AMIODARONE HCL 200 MG PO TABS
200.0000 mg | ORAL_TABLET | Freq: Two times a day (BID) | ORAL | Status: DC
  Administered 2017-04-08 – 2017-04-10 (×4): 200 mg via ORAL
  Filled 2017-04-08 (×4): qty 1

## 2017-04-08 MED ORDER — DM-GUAIFENESIN ER 30-600 MG PO TB12: 1.0000 | ORAL_TABLET | Freq: Two times a day (BID) | ORAL | Status: DC | PRN

## 2017-04-08 MED ORDER — IOPAMIDOL (ISOVUE-370) INJECTION 76%
INTRAVENOUS | Status: AC
  Administered 2017-04-08: 100 mL
  Filled 2017-04-08: qty 100

## 2017-04-08 MED ORDER — KETOROLAC TROMETHAMINE 15 MG/ML IJ SOLN
15.0000 mg | Freq: Once | INTRAMUSCULAR | Status: AC
Start: 2017-04-08 — End: 2017-04-08
  Administered 2017-04-08: 15 mg via INTRAVENOUS
  Filled 2017-04-08: qty 1

## 2017-04-08 MED ORDER — COLCHICINE 0.6 MG PO TABS
0.6000 mg | ORAL_TABLET | Freq: Two times a day (BID) | ORAL | Status: DC
  Administered 2017-04-08 – 2017-04-10 (×5): 0.6 mg via ORAL
  Filled 2017-04-08 (×5): qty 1

## 2017-04-08 MED ORDER — METOPROLOL TARTRATE 25 MG PO TABS
25.0000 mg | ORAL_TABLET | Freq: Two times a day (BID) | ORAL | Status: DC
  Administered 2017-04-08 – 2017-04-10 (×5): 25 mg via ORAL
  Filled 2017-04-08 (×4): qty 1

## 2017-04-08 MED ORDER — ONDANSETRON HCL 4 MG/2ML IJ SOLN
4.0000 mg | Freq: Four times a day (QID) | INTRAMUSCULAR | Status: DC | PRN
  Administered 2017-04-08 – 2017-04-09 (×2): 4 mg via INTRAVENOUS
  Filled 2017-04-08 (×2): qty 2

## 2017-04-08 MED ORDER — ASPIRIN EC 325 MG PO TBEC
325.0000 mg | DELAYED_RELEASE_TABLET | Freq: Every day | ORAL | Status: DC
  Administered 2017-04-08 – 2017-04-10 (×3): 325 mg via ORAL
  Filled 2017-04-08 (×3): qty 1

## 2017-04-08 MED ORDER — VANCOMYCIN HCL IN DEXTROSE 750-5 MG/150ML-% IV SOLN
750.0000 mg | Freq: Two times a day (BID) | INTRAVENOUS | Status: DC
  Administered 2017-04-08 – 2017-04-09 (×3): 750 mg via INTRAVENOUS
  Filled 2017-04-08 (×5): qty 150

## 2017-04-08 MED ORDER — HYDROMORPHONE HCL 1 MG/ML IJ SOLN
0.5000 mg | Freq: Once | INTRAMUSCULAR | Status: AC
  Administered 2017-04-08: 0.5 mg via INTRAVENOUS
  Filled 2017-04-08: qty 1

## 2017-04-08 MED ORDER — SODIUM CHLORIDE 0.9 % IV SOLN
INTRAVENOUS | Status: DC
  Administered 2017-04-08 – 2017-04-09 (×2): via INTRAVENOUS

## 2017-04-08 MED ORDER — HYDROMORPHONE HCL 1 MG/ML IJ SOLN
1.0000 mg | INTRAMUSCULAR | Status: DC | PRN
  Administered 2017-04-08: 1 mg via INTRAVENOUS
  Filled 2017-04-08: qty 1

## 2017-04-08 MED ORDER — IBUPROFEN 200 MG PO TABS: 200.0000 mg | ORAL_TABLET | Freq: Four times a day (QID) | ORAL | Status: DC | PRN

## 2017-04-08 MED ORDER — SODIUM CHLORIDE 0.9 % IV SOLN
1.0000 g | Freq: Three times a day (TID) | INTRAVENOUS | Status: DC
  Administered 2017-04-08 – 2017-04-10 (×7): 1 g via INTRAVENOUS
  Filled 2017-04-08 (×8): qty 1

## 2017-04-08 MED ORDER — ADULT MULTIVITAMIN W/MINERALS CH
1.0000 | ORAL_TABLET | Freq: Every day | ORAL | Status: DC
  Administered 2017-04-08 – 2017-04-10 (×3): 1 via ORAL
  Filled 2017-04-08 (×3): qty 1

## 2017-04-08 NOTE — Progress Notes (Signed)
Procedure(s) (LRB): SUBXYPHOID PERICARDIAL WINDOW (N/A) TRANSESOPHAGEAL ECHOCARDIOGRAM (TEE) (N/A) Subjective: Patient re-admitted after CABG with chest pain Enzymes , EKG normal Echocardiogram image s personally reviewed- small posterior postop effusion does not need drainage CT images reviewed showing sternum healing appropriately , no evidence of infection. Sub segmental atelectasis of RLL. WBC 16k- checking urine culture  Objective: Vital signs in last 24 hours: Temp:  [98.3 F (36.8 C)] 98.3 F (36.8 C) (02/25 0331) Pulse Rate:  [31-141] 62 (02/25 1757) Resp:  [13-31] 17 (02/25 1800) BP: (71-127)/(42-87) 125/75 (02/25 1800) SpO2:  [86 %-100 %] 100 % (02/25 1757) Weight:  [144 lb (65.3 kg)] 144 lb (65.3 kg) (02/24 2255)  Hemodynamic parameters for last 24 hours:    Intake/Output from previous day: 02/24 0701 - 02/25 0700 In: 1150 [IV Piggyback:1150] Out: -  Intake/Output this shift: Total I/O In: 350 [IV Piggyback:350] Out: 200 [Urine:200]    Lab Results: Recent Labs    04/07/17 2304  WBC 15.9*  HGB 9.9*  HCT 30.9*  PLT 766*   BMET:  Recent Labs    04/07/17 2304  NA 135  K 3.8  CL 100*  CO2 21*  GLUCOSE 123*  BUN 19  CREATININE 0.86  CALCIUM 9.0    PT/INR:  Recent Labs    04/08/17 0440  LABPROT 14.7  INR 1.16   ABG    Component Value Date/Time   PHART 7.347 (L) 03/26/2017 1826   HCO3 25.0 03/26/2017 1826   TCO2 29 03/28/2017 1523   ACIDBASEDEF 1.0 03/26/2017 1826   O2SAT 96.0 03/26/2017 1826   CBG (last 3)  No results for input(s): GLUCAP in the last 72 hours.  Assessment/Plan: S/P Procedure(s) (LRB): SUBXYPHOID PERICARDIAL WINDOW (N/A) TRANSESOPHAGEAL ECHOCARDIOGRAM (TEE) (N/A) Probable musculoskeletal pain - prn toradol ordered   LOS: 0 days    Gabrielle Page 04/08/2017

## 2017-04-08 NOTE — ED Provider Notes (Signed)
[facility] EMERGENCY DEPARTMENT Provider Note   CSN: [id number] Arrival date & time: 04/07/17  2249     History   Chief Complaint Chief Complaint  Patient presents with  . Chest Pain  . Shortness of Breath    HPI Gabrielle Page is a 57 y.o. female.  Patient with past medical history of asthma, chronic bronchitis, prior MI, and is status post 2 weeks triple bypass surgery performed by Dr. Darcey Nora, presents to the emergency department with a chief complaint of left chest pain and shoulder pain.  She rates the pain is severe.  She states that the pain has been worsening over the past several days, but significantly worsened today.  She reports some dry cough.  Denies fevers, or chills.  Denies shortness of breath.  She states that the pain comes in waves.  She has not taken anything for the symptoms.  There are no aggravating factors.  She takes a full aspirin, but is not otherwise anticoagulated.   The history is provided by the patient. No language interpreter was used.    Past Medical History:  Diagnosis Date  . Asthma   . Chronic bronchitis ([place])    "usually get it q yr" (11/05/2016)  . GERD (gastroesophageal reflux disease)   . Headache    "weekly" (11/05/2016)  . Heart murmur    "dx'd when I was a child"  . Hepatitis 1991   "when I had my gallbladder out" (11/05/2016)  . History of blood transfusion 1985   S/P childbirth  . History of kidney stones   . NSTEMI (non-ST elevated myocardial infarction) ([place]) 11/05/2016   9/18 PCI/DESx1 to p/mLAD  . Pancreatitis 1991  . Pneumonia    "twice" (11/05/2016)    Patient Active Problem List   Diagnosis Date Noted  . 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 Elevated LFTs 03/23/2017  . Unstable angina ([place]) 03/21/2017  . Non-ST elevation (NSTEMI) myocardial infarction ([place]) 11/05/2016  . LBBB (left bundle branch block) 11/05/2016    Past Surgical History:  Procedure Laterality Date  . ABDOMINAL HYSTERECTOMY   1990  . APPENDECTOMY  1992  . BILE DUCT EXPLORATION  1991   "reconstruction"  . CESAREAN SECTION  1985  . CHOLECYSTECTOMY OPEN  1991  . CORONARY ANGIOPLASTY WITH STENT PLACEMENT  11/05/2016  . CORONARY ARTERY BYPASS GRAFT N/A 03/26/2017   Procedure: CORONARY ARTERY BYPASS GRAFTING (CABG) x 3 WITH ENDOSCOPIC HARVESTING OF RIGHT SAPHENOUS VEIN;  Surgeon: Ivin Poot, MD;  Location: [facility];  Service: Open Heart Surgery;  Laterality: N/A;  . CORONARY STENT INTERVENTION N/A 11/05/2016   Procedure: CORONARY STENT INTERVENTION;  Surgeon: Martinique, Peter M, MD;  Location: [facility];  Service: Cardiovascular;  Laterality: N/A;  . CYSTOSCOPY WITH STENT PLACEMENT  "3 times" (11/05/2016)  . DILATION AND CURETTAGE OF UTERUS  1989  . INTRAVASCULAR ULTRASOUND/IVUS N/A 03/22/2017   Procedure: Intravascular Ultrasound/IVUS;  Surgeon: Martinique, Peter M, MD;  Location: [facility];  Service: Cardiovascular;  Laterality: N/A;  . KNEE ARTHROSCOPY Right 2000s  . LEFT HEART CATH AND CORONARY ANGIOGRAPHY N/A 11/05/2016   Procedure: LEFT HEART CATH AND CORONARY ANGIOGRAPHY;  Surgeon: Martinique, Peter M, MD;  Location: [facility];  Service: Cardiovascular;  Laterality: N/A;  . LEFT HEART CATH AND CORONARY ANGIOGRAPHY N/A 03/22/2017   Procedure: LEFT HEART CATH AND CORONARY ANGIOGRAPHY;  Surgeon: Martinique, Peter M, MD;  Location: [facility];  Service: Cardiovascular;  Laterality: N/A;  .  NASAL SINUS SURGERY  ~ 2009   "exposed to mildew; had to have the infectoin drained; cut the outside of my face"  . OVARIAN CYST SURGERY  X 6  . TEE WITHOUT CARDIOVERSION N/A 03/26/2017   Procedure: TRANSESOPHAGEAL ECHOCARDIOGRAM (TEE);  Surgeon: Prescott Gum, [name], MD;  Location: [facility];  Service: Open Heart Surgery;  Laterality: N/A;    OB History    No data available       Home Medications    Prior to Admission medications   Medication Sig Start Date End Date Taking? Authorizing Provider  amiodarone  (PACERONE) 200 MG tablet Take 1 tablet (200 mg total) by mouth 2 (two) times daily. 03/31/17   [name], PA-C  aspirin EC 325 MG EC tablet Take 1 tablet (325 mg total) by mouth daily. 04/01/17   Gold, Wilder Glade, PA-C  HYDROmorphone (DILAUDID) 2 MG tablet Take 1 tablet (2 mg total) by mouth every 6 (six) hours as needed for moderate pain or severe pain. 03/31/17   Gold, Patrick Jupiter E, PA-C  metoprolol tartrate (LOPRESSOR) 25 MG tablet Take 1 tablet (25 mg total) by mouth 2 (two) times daily. 03/31/17   [name], PA-C  Multiple Vitamin (MULTIVITAMIN WITH MINERALS) TABS tablet Take 1 tablet by mouth daily.    [provider]    Family History Family History  Problem Relation Age of Onset  . Hypertension Father   . Alcohol abuse Father   . Heart attack Father   . Diabetes Mellitus II Father   . Suicidality Mother     Social History Social History   Tobacco Use  . Smoking status: Never Smoker  . Smokeless tobacco: Never Used  Substance Use Topics  . Alcohol use: Yes    Comment: 11/05/2016 "maybe 1 glass of wine/month"  . Drug use: No     Allergies   Compazine [prochlorperazine edisylate] and Other   Review of Systems Review of Systems  All other systems reviewed and are negative.    Physical Exam Updated Vital Signs BP 121/79 (BP Location: Left Arm)   Pulse 68   Resp 18   Ht 5\' 3"  (1.6 m)   Wt 65.3 kg (144 lb)   SpO2 100%   BMI 25.51 kg/m   Physical Exam  Constitutional: She is oriented to person, place, and time. She appears well-developed and well-nourished.  HENT:  Head: Normocephalic and atraumatic.  Eyes: Conjunctivae and EOM are normal. Pupils are equal, round, and reactive to light.  Neck: Normal range of motion. Neck supple.  Cardiovascular: Normal rate and regular rhythm. Exam reveals no gallop and no friction rub.  No murmur heard. Healing incision to central chest wall, TTP  Pulmonary/Chest: Effort normal and breath sounds normal. No respiratory  distress. She has no wheezes. She has no rales. She exhibits no tenderness.  Diminished right sided lower lung sounds  Abdominal: Soft. Bowel sounds are normal. She exhibits no distension and no mass. There is no tenderness. There is no rebound and no guarding.  Musculoskeletal: Normal range of motion. She exhibits no edema or tenderness.  Neurological: She is alert and oriented to person, place, and time.  Skin: Skin is warm and dry.  Psychiatric: She has a normal mood and affect. Her behavior is normal. Judgment and thought content normal.  Nursing note and vitals reviewed.    ED Treatments / Results  Labs (all labs ordered are listed, but only abnormal results are displayed) Labs Reviewed  BASIC METABOLIC  PANEL - Abnormal; Notable for the following components:      Result Value   Chloride 100 (*)    CO2 21 (*)    Glucose, Bld 123 (*)    All other components within normal limits  CBC - Abnormal; Notable for the following components:   WBC 15.9 (*)    RBC 3.35 (*)    Hemoglobin 9.9 (*)    HCT 30.9 (*)    RDW 16.2 (*)    Platelets 766 (*)    All other components within normal limits  I-STAT TROPONIN, ED    EKG  EKG Interpretation  Date/Time:  Sunday April 07 2017 23:01:35 EST Ventricular Rate:  71 PR Interval:  152 QRS Duration: 90 QT Interval:  398 QTC Calculation: 432 R Axis:   -42 Text Interpretation:  Normal sinus rhythm Left axis deviation Possible Anterior infarct , age undetermined Abnormal ECG No significant change since last tracing Confirmed by Pryor Curia [phone]) on 04/08/2017 2:19:32 AM       Radiology Dg Chest 2 View  Result Date: 04/07/2017 CLINICAL DATA:  Acute chest pain. EXAM: CHEST  2 VIEW COMPARISON:  03/30/2017 and prior radiographs FINDINGS: Cardiomegaly and CABG changes again noted. Mild RIGHT basilar atelectasis and trace bilateral pleural effusions again noted. There is no evidence of pulmonary edema, airspace disease or pneumothorax. No  acute bony abnormalities are present. IMPRESSION: Unchanged appearance of the chest with cardiomegaly, RIGHT basilar atelectasis and trace bilateral pleural effusions. Electronically Signed   By: Margarette Canada M.D.   On: 04/07/2017 23:34    Procedures Procedures (including critical care time)  Medications Ordered in ED Medications  levofloxacin (LEVAQUIN) IVPB 750 mg (not administered)  HYDROmorphone (DILAUDID) injection 1 mg (1 mg Intravenous Given 04/08/17 0204)  methocarbamol (ROBAXIN) injection 500 mg (500 mg Intravenous Given 04/08/17 0300)  iopamidol (ISOVUE-370) 76 % injection (100 mLs  Contrast Given 04/08/17 0232)  HYDROmorphone (DILAUDID) injection 1 mg (1 mg Intravenous Given 04/08/17 0329)     Initial Impression / Assessment and Plan / ED Course  I have reviewed the triage vital signs and the nursing notes.  Pertinent labs & imaging results that were available during my care of the patient were reviewed by me and considered in my medical decision making (see chart for details).     Patient with chest pain and shoulder pain.  She is status post 2 weeks triple bypass surgery.  She has severe pain and appears visibly uncomfortable on the stretcher.  We will treat pain, check labs, and reassess.  Troponin is 0.03.  She has a mild leukocytosis, but denies productive cough or fever.  She has had a dry cough.  Given recent surgery and location of pain and apparent severity of pain, will check CT scan of chest.  CT reveals new moderate pericardial effusion and small bilateral pleural effusions.   CT also shows airspace opacity in the right lung base, which could be pneumonia or atelectasis.  Patient discussed with Dr. Leonides Schanz, who recommends cardiology consultation.  3:33 AM Discussed the case with Dr. Raiford Simmonds, from cardiology, who will see the patient.  Seen by and discussed with Dr. Raiford Simmonds, bedside US performed, which showed effusion, but complicated by edema and pain. Will need formal US  tomorrow.  Recommends admission to medicine.  Cards will follow along.  Recommends covering for [facility].  4:20 AM Discussed with Dr. Blaine Hamper, who will admit.    Final Clinical Impressions(s) / ED Diagnoses   Final diagnoses:  Chest pain, unspecified type  Acute pain of left shoulder  Pericardial effusion    ED Discharge Orders    None       Montine Circle, PA-C 04/08/17 0420    Ward, Delice Bison, DO 04/08/17 [phone]

## 2017-04-08 NOTE — H&P (Addendum)
History and Physical    Gabrielle Page TIR:[id number] DOB: 02/02/1961 DOA: 04/07/2017  Referring MD/NP/PA:   PCP: [name], NP   Patient coming from:  The patient is coming from home.  At baseline, pt is independent for most of ADL.   Chief Complaint: chest pain, cough and SOB  HPI: Gabrielle Page is a 57 y.o. 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  Pt is status post 2 weeks triple bypass surgery performed by Dr. Darcey Nora. She state that she started she has been having chest pain for several days, which has worsened today. It is located in the frontal chest and also in left upper chest close to left shoulder. She has a cough with small amount of mild pink mucus production. She has some mild shortness rest, and a fever or for 129 and chills. She also has runny nose and dry throat. The chest is constant, sharp, 10 out of 10 in severity initially, currently 5 out of 10 in severity, radiating to both shoulders. Patient states that she has nausea and vomited 3 times, but no diarrhea. She has mild epigastric abdominal cramping pain. Denies symptoms of UTI or unilateral weakness.  ED Course: pt was found to have negative troponin, WBC 15.9, electrolytes renal function okay, temperature 98.3, heart rate 60s, oxygen saturation 93% on room air. Chest x-ray showed bilateral trace amount of pleural effusion. Patient is placed on telemetry bed for obs. Cardiology, Dr. Raiford Simmonds was consulted.  CTA of chest showed 1. No evidence of pulmonary embolus. 2. New moderate pericardial effusion and small bilateral pleural effusions. 3. Elevation of the right hemidiaphragm.   4. Airspace opacity at the right lung base in the right lower lobe could reflect pneumonia or atelectasis. Linear atelectasis in the left base. Scattered ground-glass opacities in the  lungs, likely early edema.   Review of Systems:   General: has fevers, chills, no body weight gain, has poor appetite, has fatigue HEENT: no blurry vision, hearing changes or sore throat Respiratory: no dyspnea, coughing, wheezing CV: has chest pain, no palpitations GI: has nausea, vomiting, abdominal pain, no diarrhea, constipation GU: no dysuria, burning on urination, increased urinary frequency, hematuria  Ext: no leg edema Neuro: no unilateral weakness, numbness, or tingling, no vision change or hearing loss Skin: no rash, no skin tear. MSK: has left should pain. Heme: No easy bruising.  Travel history: No recent long distant travel.  Allergy:  Allergies  Allergen Reactions  . Compazine [Prochlorperazine Edisylate] Other (See Comments)    SEIZURE  . Other Other (See Comments)    Steroids cause uncontrollable coughing    Past Medical History:  Diagnosis Date  . Asthma   . Chronic bronchitis ([place])    "usually get it q yr" (11/05/2016)  . GERD (gastroesophageal reflux disease)   . Headache    "weekly" (11/05/2016)  . Heart murmur    "dx'd when I was a child"  . Hepatitis 1991   "when I had my gallbladder out" (11/05/2016)  . History of blood transfusion 1985   S/P childbirth  . History of kidney stones   . NSTEMI (non-ST elevated myocardial infarction) ([place]) 11/05/2016   9/18 PCI/DESx1 to p/mLAD  . Pancreatitis 1991  . Pneumonia    "twice" (11/05/2016)    Past Surgical History:  Procedure Laterality Date  . ABDOMINAL HYSTERECTOMY  1990  . APPENDECTOMY  1992  . BILE  DUCT EXPLORATION  1991   "reconstruction"  . CESAREAN SECTION  1985  . CHOLECYSTECTOMY OPEN  1991  . CORONARY ANGIOPLASTY WITH STENT PLACEMENT  11/05/2016  . CORONARY ARTERY BYPASS GRAFT N/A 03/26/2017   Procedure: CORONARY ARTERY BYPASS GRAFTING (CABG) x 3 WITH ENDOSCOPIC HARVESTING OF RIGHT SAPHENOUS VEIN;  Surgeon: Ivin Poot, MD;  Location: [facility];  Service: Open Heart Surgery;  Laterality:  N/A;  . CORONARY STENT INTERVENTION N/A 11/05/2016   Procedure: CORONARY STENT INTERVENTION;  Surgeon: Martinique, Peter M, MD;  Location: [facility];  Service: Cardiovascular;  Laterality: N/A;  . CYSTOSCOPY WITH STENT PLACEMENT  "3 times" (11/05/2016)  . DILATION AND CURETTAGE OF UTERUS  1989  . INTRAVASCULAR ULTRASOUND/IVUS N/A 03/22/2017   Procedure: Intravascular Ultrasound/IVUS;  Surgeon: Martinique, Peter M, MD;  Location: [facility];  Service: Cardiovascular;  Laterality: N/A;  . KNEE ARTHROSCOPY Right 2000s  . LEFT HEART CATH AND CORONARY ANGIOGRAPHY N/A 11/05/2016   Procedure: LEFT HEART CATH AND CORONARY ANGIOGRAPHY;  Surgeon: Martinique, Peter M, MD;  Location: [facility];  Service: Cardiovascular;  Laterality: N/A;  . LEFT HEART CATH AND CORONARY ANGIOGRAPHY N/A 03/22/2017   Procedure: LEFT HEART CATH AND CORONARY ANGIOGRAPHY;  Surgeon: Martinique, Peter M, MD;  Location: [facility];  Service: Cardiovascular;  Laterality: N/A;  . NASAL SINUS SURGERY  ~ 2009   "exposed to mildew; had to have the infectoin drained; cut the outside of my face"  . OVARIAN CYST SURGERY  X 6  . TEE WITHOUT CARDIOVERSION N/A 03/26/2017   Procedure: TRANSESOPHAGEAL ECHOCARDIOGRAM (TEE);  Surgeon: Prescott Gum, [name], MD;  Location: [facility];  Service: Open Heart Surgery;  Laterality: N/A;    Social History:  reports that  has never smoked. she has never used smokeless tobacco. She reports that she drinks alcohol. She reports that she does not use drugs.  Family History:  Family History  Problem Relation Age of Onset  . Hypertension Father   . Alcohol abuse Father   . Heart attack Father   . Diabetes Mellitus II Father   . Suicidality Mother      Prior to Admission medications   Medication Sig Start Date End Date Taking? Authorizing Provider  amiodarone (PACERONE) 200 MG tablet Take 1 tablet (200 mg total) by mouth 2 (two) times daily. 03/31/17  Yes Gold, Wilder Glade, PA-C  aspirin EC 325 MG EC  tablet Take 1 tablet (325 mg total) by mouth daily. 04/01/17  Yes Gold, Wayne E, PA-C  HYDROmorphone (DILAUDID) 2 MG tablet Take 1 tablet (2 mg total) by mouth every 6 (six) hours as needed for moderate pain or severe pain. 03/31/17  Yes Gold, Wayne E, PA-C  metoprolol tartrate (LOPRESSOR) 25 MG tablet Take 1 tablet (25 mg total) by mouth 2 (two) times daily. 03/31/17  Yes Gold, Wilder Glade, PA-C  Multiple Vitamin (MULTIVITAMIN WITH MINERALS) TABS tablet Take 1 tablet by mouth daily.   Yes [provider]    Physical Exam: Vitals:   04/08/17 0330 04/08/17 0331 04/08/17 0400 04/08/17 0430  BP: 127/72  101/78 102/62  Pulse: 70  68 67  Resp: 19  18 16   Temp:  98.3 F (36.8 C)    TempSrc:  Oral    SpO2: 95%  90% 93%  Weight:      Height:       General: Not in acute distress HEENT:       Eyes: PERRL, EOMI, no  scleral icterus.       ENT: No discharge from the ears and nose, no pharynx injection, no tonsillar enlargement.        Neck: No JVD, no bruit, no mass felt. Heme: No neck lymph node enlargement. Cardiac: S1/S2, RRR, No murmurs, No gallops or rubs. Respiratory:  No rales, wheezing, rhonchi or rubs. GI: Soft, nondistended, mild tenderness in epigastric area, no rebound pain, no organomegaly, BS present. GU: No hematuria Ext: No pitting leg edema bilaterally. 2+DP/PT pulse bilaterally. Musculoskeletal: No joint deformities, No joint redness or warmth, no limitation of ROM in spin. Skin: No rashes.  Neuro: Alert, oriented X3, cranial nerves II-XII grossly intact, moves all extremities normally. Psych: Patient is not psychotic, no suicidal or hemocidal ideation.  Labs on Admission: I have personally reviewed following labs and imaging studies  CBC: Recent Labs  Lab 04/07/17 2304  WBC 15.9*  HGB 9.9*  HCT 30.9*  MCV 92.2  PLT 633*   Basic Metabolic Panel: Recent Labs  Lab 04/07/17 2304  NA 135  K 3.8  CL 100*  CO2 21*  GLUCOSE 123*  BUN 19  CREATININE 0.86    CALCIUM 9.0   GFR: Estimated Creatinine Clearance: 68 mL/min (by C-G formula based on SCr of 0.86 mg/dL). Liver Function Tests: No results for input(s): AST, ALT, ALKPHOS, BILITOT, PROT, ALBUMIN in the last 168 hours. No results for input(s): LIPASE, AMYLASE in the last 168 hours. No results for input(s): AMMONIA in the last 168 hours. Coagulation Profile: No results for input(s): INR, PROTIME in the last 168 hours. Cardiac Enzymes: No results for input(s): CKTOTAL, CKMB, CKMBINDEX, TROPONINI in the last 168 hours. BNP (last 3 results) No results for input(s): PROBNP in the last 8760 hours. HbA1C: No results for input(s): HGBA1C in the last 72 hours. CBG: No results for input(s): GLUCAP in the last 168 hours. Lipid Profile: No results for input(s): CHOL, HDL, LDLCALC, TRIG, CHOLHDL, LDLDIRECT in the last 72 hours. Thyroid Function Tests: No results for input(s): TSH, T4TOTAL, FREET4, T3FREE, THYROIDAB in the last 72 hours. Anemia Panel: No results for input(s): VITAMINB12, FOLATE, FERRITIN, TIBC, IRON, RETICCTPCT in the last 72 hours. Urine analysis:    Component Value Date/Time   COLORURINE YELLOW 03/25/2017 1358   APPEARANCEUR HAZY (A) 03/25/2017 1358   LABSPEC 1.019 03/25/2017 1358   PHURINE 6.0 03/25/2017 1358   GLUCOSEU NEGATIVE 03/25/2017 1358   HGBUR MODERATE (A) 03/25/2017 1358   BILIRUBINUR NEGATIVE 03/25/2017 1358   KETONESUR NEGATIVE 03/25/2017 1358   PROTEINUR NEGATIVE 03/25/2017 1358   NITRITE NEGATIVE 03/25/2017 1358   LEUKOCYTESUR NEGATIVE 03/25/2017 1358   Sepsis Labs: @LABRCNTIP (procalcitonin:4,lacticidven:4) )No results found for this or any previous visit (from the past 240 hour(s)).   Radiological Exams on Admission: Dg Chest 2 View  Result Date: 04/07/2017 CLINICAL DATA:  Acute chest pain. EXAM: CHEST  2 VIEW COMPARISON:  03/30/2017 and prior radiographs FINDINGS: Cardiomegaly and CABG changes again noted. Mild RIGHT basilar atelectasis and trace  bilateral pleural effusions again noted. There is no evidence of pulmonary edema, airspace disease or pneumothorax. No acute bony abnormalities are present. IMPRESSION: Unchanged appearance of the chest with cardiomegaly, RIGHT basilar atelectasis and trace bilateral pleural effusions. Electronically Signed   By: Margarette Canada M.D.   On: 04/07/2017 23:34   Ct Angio Chest Pe W And/or Wo Contrast  Result Date: 04/08/2017 CLINICAL DATA:  Mid sternal chest pain. EXAM: CT ANGIOGRAPHY CHEST WITH CONTRAST TECHNIQUE: Multidetector CT imaging of the chest was  performed using the standard protocol during bolus administration of intravenous contrast. Multiplanar CT image reconstructions and MIPs were obtained to evaluate the vascular anatomy. CONTRAST:  184mL ISOVUE-370 IOPAMIDOL (ISOVUE-370) INJECTION 76% COMPARISON:  Chest x-ray 04/07/2017.  Chest CT 03/24/2017. FINDINGS: Cardiovascular: No filling defects in the pulmonary arteries to suggest pulmonary emboli. Cardiomegaly. Moderate pericardial effusion, new since prior study. No evidence of aortic aneurysm. Prior CABG. Mediastinum/Nodes: No mediastinal, hilar, or axillary adenopathy. Lungs/Pleura: Small bilateral pleural effusions, new since prior study. Airspace opacity noted in the right lower lobe with elevation of the right hemidiaphragm. This could reflect atelectasis or pneumonia. Left lower lobe linear atelectasis noted. Mild ground-glass opacities in the lungs could reflect early edema. Upper Abdomen: Pneumobilia noted within the liver, stable. Imaging into the upper abdomen shows no acute findings. Musculoskeletal: Chest wall soft tissues are unremarkable. No acute bony abnormality. Review of the MIP images confirms the above findings. IMPRESSION: No evidence of pulmonary embolus. Cardiomegaly, prior CABG. New moderate pericardial effusion and small bilateral pleural effusions. Elevation of the right hemidiaphragm. Airspace opacity at the right lung base in the  right lower lobe could reflect pneumonia or atelectasis. Linear atelectasis in the left base. Scattered ground-glass opacities in the lungs, likely early edema. No evidence of pneumothorax. Electronically Signed   By: Rolm Baptise M.D.   On: 04/08/2017 02:55     EKG: Independently reviewed.  Sinus rhythm, QTC 432, low voltage, LAD, poor R-wave progression  Assessment/Plan Principal Problem:   Chest pain Active Problems:   S/P CABG x 3   CAD (coronary artery disease)   Pericardial effusion   Lobar pneumonia (HCC)   GERD (gastroesophageal reflux disease)   Abdominal pain   Chest pain and CAD: s/p of stent and recent CABG. Etiology is not clear. CTA is negative for PE, but showed pericardial effusion, indicating pericarditis. Currently patient does not have signs Are normal. Hemodynamically stable. Cardiology, Dr. Raiford Simmonds was consulted. Given recent CABG, will also need to r/o ACS.  - will place on Tele bed for obs - cycle CE q6 x3 and repeat EKG in the am  - prn Nitroglycerin, dilaudid, and aspirin - pt has abnormal liver function, will not use statin - Risk factor stratification: will check FLP and A1C  - 2d echo - f/u Dr. Teofilo Pod recommendations  Pericardial effusion: -f/u 2d echo for further evaluation -started colchicine per card  Possible lobar pneumonia [facility]): Patient reports a productive cough, fever of 100.9. Patient has leukocytosis. CT angiogram showed right opacity, indicating possible lobar pneumonia. - Levaquin was started in ED, will continue - Mucinex for cough  - prn Albuterol Nebs for SOB - Urine legionella and S. pneumococcal antigen - Follow up blood culture x2, sputum culture, plus Flu pcr - will get Procalcitonin and trend lactic acid level - IVF: 1L of NS bolus in ED, followed by 72mL per hour of NS  GERD: -Protonix prn  Abdominal pain: Patient reports mild epigastric cramping pain. She also has nausea and vomited several times. There is no acute abdomen  on physical examination. Etiology is not clear. May be due to chest pain radiating to the epigastric area. -check lipase -When necessary Zofran for nausea.   DVT ppx: SQ Heparin     Code Status: Full code Family Communication: None at bed side.   Disposition Plan:  Anticipate discharge back to previous home environment Consults called:  Card, Dr. Raiford Simmonds Admission status: Obs / tele    Date of Service 04/08/2017  Ivor Costa [facility] Pager [phone]  If 7PM-7AM, please contact night-coverage [email or web address] Password [facility] 04/08/2017, 5:48 AM

## 2017-04-08 NOTE — Progress Notes (Signed)
Progress Note  Patient Name: Gabrielle Page Date of Encounter: 04/08/2017  Primary Cardiologist: Peter Martinique, MD  Subjective   Pt w/ severe L shoulder pain, hurts more to breathe, the shoulder pain started about a week ago. +SOB as well, pain is worsening, pt appears very uncomfortable.   Inpatient Medications    Scheduled Meds: . amiodarone  200 mg Oral BID  . aspirin  325 mg Oral Daily  . colchicine  0.6 mg Oral BID  . heparin  5,000 Units Subcutaneous Q8H  . metoprolol tartrate  25 mg Oral BID  . multivitamin with minerals  1 tablet Oral Daily   Continuous Infusions: . sodium chloride 75 mL/hr at 04/08/17 0548  . [START ON 04/09/2017] levofloxacin (LEVAQUIN) IV     PRN Meds: albuterol, ALPRAZolam, dextromethorphan-guaiFENesin, HYDROmorphone (DILAUDID) injection, ibuprofen, ondansetron (ZOFRAN) IV, oxyCODONE, pantoprazole, zolpidem   Vital Signs    Vitals:   04/08/17 0700 04/08/17 0730 04/08/17 0800 04/08/17 0805  BP: (!) 94/58 100/76 (!) 87/48 (!) 81/42  Pulse: 63 63 63 66  Resp: (!) 31 (!) 24 18 (!) 29  Temp:      TempSrc:      SpO2: 96% 96% 100% 96%  Weight:      Height:        Intake/Output Summary (Last 24 hours) at 04/08/2017 0818 Last data filed at 04/08/2017 0810 Gross per 24 hour  Intake 1150 ml  Output 100 ml  Net 1050 ml   Filed Weights   04/07/17 2255  Weight: 144 lb (65.3 kg)    Telemetry    SR, HR 60s - Personally Reviewed  ECG    02/24, SR, no acute changes, no new Q waves - Personally Reviewed  Physical Exam   General: Well developed, well nourished, female appearing in no acute distress. Head: Normocephalic, atraumatic.  Neck: Supple without bruits, JVD to jaw. Lungs:  Resp regular and unlabored, bibasilar rales and rhonchi Heart: RRR, S1, S2, no S3, S4, short systolic murmur; no rub. Abdomen: Soft, non-tender, non-distended with normoactive bowel sounds. No hepatomegaly. No rebound/guarding. No obvious abdominal  masses. Extremities: No clubbing, cyanosis, no edema. Distal pedal pulses are 2+ bilaterally. Neuro: Alert and oriented X 3. Moves all extremities spontaneously. Psych: Normal affect.  Labs    Hematology Recent Labs  Lab 04/07/17 2304  WBC 15.9*  RBC 3.35*  HGB 9.9*  HCT 30.9*  MCV 92.2  MCH 29.6  MCHC 32.0  RDW 16.2*  PLT 766*    Chemistry Recent Labs  Lab 04/07/17 2304  NA 135  K 3.8  CL 100*  CO2 21*  GLUCOSE 123*  BUN 19  CREATININE 0.86  CALCIUM 9.0  GFRNONAA >60  GFRAA >60  ANIONGAP 14     Cardiac Enzymes Recent Labs  Lab 04/08/17 0440  TROPONINI 0.07*    Recent Labs  Lab 04/07/17 2316  [place] 0.03     Radiology    Dg Chest 2 View  Result Date: 04/07/2017 CLINICAL DATA:  Acute chest pain. EXAM: CHEST  2 VIEW COMPARISON:  03/30/2017 and prior radiographs FINDINGS: Cardiomegaly and CABG changes again noted. Mild RIGHT basilar atelectasis and trace bilateral pleural effusions again noted. There is no evidence of pulmonary edema, airspace disease or pneumothorax. No acute bony abnormalities are present. IMPRESSION: Unchanged appearance of the chest with cardiomegaly, RIGHT basilar atelectasis and trace bilateral pleural effusions. Electronically Signed   By: Margarette Canada M.D.   On: 04/07/2017 23:34   Ct Angio Chest  Pe W And/or Wo Contrast  Result Date: 04/08/2017 CLINICAL DATA:  Mid sternal chest pain. EXAM: CT ANGIOGRAPHY CHEST WITH CONTRAST TECHNIQUE: Multidetector CT imaging of the chest was performed using the standard protocol during bolus administration of intravenous contrast. Multiplanar CT image reconstructions and MIPs were obtained to evaluate the vascular anatomy. CONTRAST:  135mL ISOVUE-370 IOPAMIDOL (ISOVUE-370) INJECTION 76% COMPARISON:  Chest x-ray 04/07/2017.  Chest CT 03/24/2017. FINDINGS: Cardiovascular: No filling defects in the pulmonary arteries to suggest pulmonary emboli. Cardiomegaly. Moderate pericardial effusion, new since prior  study. No evidence of aortic aneurysm. Prior CABG. Mediastinum/Nodes: No mediastinal, hilar, or axillary adenopathy. Lungs/Pleura: Small bilateral pleural effusions, new since prior study. Airspace opacity noted in the right lower lobe with elevation of the right hemidiaphragm. This could reflect atelectasis or pneumonia. Left lower lobe linear atelectasis noted. Mild ground-glass opacities in the lungs could reflect early edema. Upper Abdomen: Pneumobilia noted within the liver, stable. Imaging into the upper abdomen shows no acute findings. Musculoskeletal: Chest wall soft tissues are unremarkable. No acute bony abnormality. Review of the MIP images confirms the above findings. IMPRESSION: No evidence of pulmonary embolus. Cardiomegaly, prior CABG. New moderate pericardial effusion and small bilateral pleural effusions. Elevation of the right hemidiaphragm. Airspace opacity at the right lung base in the right lower lobe could reflect pneumonia or atelectasis. Linear atelectasis in the left base. Scattered ground-glass opacities in the lungs, likely early edema. No evidence of pneumothorax. Electronically Signed   By: Rolm Baptise M.D.   On: 04/08/2017 02:55     Cardiac Studies   ECHO: ordered   Patient Profile     57 y.o. female w/ hx CABG 03/26/2017 w/ LIMA-LAD, SVG-Diag, SVG-OM, GERD, chronic bronchitis, asthma was admitted 02/24 w/ L shoulder pain, abd pain, ?PNA, new peric effusion.   Assessment & Plan     Principal Problem: 1.  Chest pain - initial trop minimally elevated, follow - ECG not acute  2.  Pericardial effusion - new, since surgery - colchicine has been ordered, not given yet - echo ordered, not done yet>>make stat - splinting resp and L shoulder pain concerning for referred pain, ?from effusion, may also need to consider abdominal process as she has had some GI sx - discuss adding toradol to colchicine  Active Problems:   S/P CABG x 3   CAD (coronary artery disease)    Lobar pneumonia (HCC)   GERD (gastroesophageal reflux disease)   Abdominal pain    Signed, Rosaria Ferries , PA-C 8:18 AM 04/08/2017 Pager: [phone]

## 2017-04-08 NOTE — Progress Notes (Signed)
Pharmacy Antibiotic Note  Gabrielle Page is a 57 y.o. female admitted on 04/07/2017 with pneumonia.  Pharmacy has been consulted for vancomycin dosing. Also on cefepime per MD. S/p 1 dose of Levaquin in the ED. SCr 0.86 on admit 2/24, CrCl~65-70.  Plan: Vancomycin 1250mg  IV x 1; then 750mg  IV q12h Cefepime 1g IV q8h per MD Monitor clinical progress, c/s, renal function F/u de-escalation plan/LOT, vancomycin trough as indicated   Height: 5\' 3"  (160 cm) Weight: 144 lb (65.3 kg) IBW/kg (Calculated) : 52.4  Temp (24hrs), Avg:98.3 F (36.8 C), Min:98.3 F (36.8 C), Max:98.3 F (36.8 C)  Recent Labs  Lab 04/07/17 2304 04/08/17 0440  WBC 15.9*  --   CREATININE 0.86  --   LATICACIDVEN  --  1.7    Estimated Creatinine Clearance: 68 mL/min (by C-G formula based on SCr of 0.86 mg/dL).    Allergies  Allergen Reactions  . Compazine [Prochlorperazine Edisylate] Other (See Comments)    SEIZURE  . Other Other (See Comments)    Steroids cause uncontrollable coughing    Elicia Lamp, PharmD, BCPS Clinical Pharmacist Clinical phone for 04/08/2017 until 3:30pm: [phone] If after 3:30pm, please call main pharmacy at: [phone] 04/08/2017 8:32 AM

## 2017-04-08 NOTE — ED Notes (Signed)
Pt remains in waiting room. Updated on wait for treatment room. 

## 2017-04-08 NOTE — Consult Note (Signed)
Name: Gabrielle Page MRN: [id number] DOB: 1960-03-21    ADMISSION DATE:  04/07/2017 CONSULTATION DATE:  2/254/19  REFERRING MD :  Dr. Eliseo Squires  CHIEF COMPLAINT:  Chest Pain    HISTORY OF PRESENT ILLNESS:  57 y/o F who presented to [facility] on 2/25 with approximately one week history of chest pain that increased over the 48 hours prior to presentation.    The patient was recently discharged 03/31/17 after CABG x3 with right saphenous vein harvest.  She carries a hx of asthma/chronic bronchitis & GERD.  The patient reports she followed instructions post surgery, completed her pain medications as prescribed and was up walking.  She developed back pain approximately one week prior to admit.  This progressed to chest and left arm pain.  She reports her left chest feels "numb".  Denies fevers, chills, nausea.  She reports shortness of breath that is related to her pain.  She reported the pain increased significantly and she called EMS for evaluation on 2/25 for evaluation.  She attempted to call the on call service for surgery prior to placement but was unable to get in touch with on-call staff.  Initial ER work up notable for troponin of 0.07 > 0.06, lactic 1.7 to 1.3, WBC 15.9, Hgb 9.9, platelets 766, Na 135, K 3.8, Cl 100, glucose 123, sr cr 0.86.  CXR showed right basilar atelectasis on small effusion.  While in ER, she had occasional soft blood pressure readings (with normal mental status).  To further evaluate chest pain, she had a CTA of the chest which was negative for PE but demonstrated new moderate pericardial effusion and small bilateral pleural effusions, elevated right hemidiaphragm, airspace opacity in right lung base (atx vs pna), linear atelectasis in left base, scattered ground glass opacities in the lungs, likely early edema, no pneumothorax.   She was admitted per [facility] for further evaluation.  Empiric antibiotics initiated.    PCCM consulted for evaluation of possible hypotension /  infectious concerns.   PAST MEDICAL HISTORY :   has a past medical history of Asthma, Chronic bronchitis ([place]), GERD (gastroesophageal reflux disease), Headache, Heart murmur, Hepatitis (1991), History of blood transfusion (1985), History of kidney stones, NSTEMI (non-ST elevated myocardial infarction) ([place]) (11/05/2016), Pancreatitis (1991), and Pneumonia.   has a past surgical history that includes Appendectomy (1992); Cholecystectomy open (1991); Bile duct exploration (1991); Knee arthroscopy (Right, 2000s); Cystoscopy with stent placement ("3 times" (11/05/2016)); Nasal sinus surgery (~ 2009); Coronary angioplasty with stent (11/05/2016); Abdominal hysterectomy (1990); Dilation and curettage of uterus (1989); Cesarean section (1985); Ovarian cyst surgery (X 6); LEFT HEART CATH AND CORONARY ANGIOGRAPHY (N/A, 11/05/2016); CORONARY STENT INTERVENTION (N/A, 11/05/2016); LEFT HEART CATH AND CORONARY ANGIOGRAPHY (N/A, 03/22/2017); Intravascular Ultrasound/IVUS (N/A, 03/22/2017); Coronary artery bypass graft (N/A, 03/26/2017); and TEE without cardioversion (N/A, 03/26/2017).  Prior to Admission medications   Medication Sig Start Date End Date Taking? Authorizing Provider  amiodarone (PACERONE) 200 MG tablet Take 1 tablet (200 mg total) by mouth 2 (two) times daily. 03/31/17  Yes Gold, Wilder Glade, PA-C  aspirin EC 325 MG EC tablet Take 1 tablet (325 mg total) by mouth daily. 04/01/17  Yes Gold, Wayne E, PA-C  HYDROmorphone (DILAUDID) 2 MG tablet Take 1 tablet (2 mg total) by mouth every 6 (six) hours as needed for moderate pain or severe pain. 03/31/17  Yes Gold, Wayne E, PA-C  metoprolol tartrate (LOPRESSOR) 25 MG tablet Take 1 tablet (25 mg total) by mouth 2 (two) times daily. 03/31/17  Yes  Jadene Pierini E, PA-C  Multiple Vitamin (MULTIVITAMIN WITH MINERALS) TABS tablet Take 1 tablet by mouth daily.   Yes [provider]    Allergies  Allergen Reactions  . Compazine [Prochlorperazine Edisylate] Other (See  Comments)    SEIZURE  . Other Other (See Comments)    Steroids cause uncontrollable coughing    FAMILY HISTORY:  family history includes Alcohol abuse in her father; Diabetes Mellitus II in her father; Heart attack in her father; Hypertension in her father; Suicidality in her mother.  SOCIAL HISTORY:  reports that  has never smoked. she has never used smokeless tobacco. She reports that she drinks alcohol. She reports that she does not use drugs.  REVIEW OF SYSTEMS:  POSITIVES IN BOLD Constitutional: Negative for fever, chills, weight loss, malaise/fatigue and diaphoresis.  HENT: Negative for hearing loss, ear pain, nosebleeds, congestion, sore throat, neck pain, tinnitus and ear discharge.   Eyes: Negative for blurred vision, double vision, photophobia, pain, discharge and redness.  Respiratory: Negative for cough, hemoptysis, sputum production, shortness of breath, wheezing and stridor.   Cardiovascular: Negative for back pain that moved into chest pain and left arm, palpitations, orthopnea, claudication, leg swelling and PND.  Gastrointestinal: Negative for heartburn, nausea, vomiting, abdominal pain, diarrhea, constipation, blood in stool and melena.  Genitourinary: Negative for dysuria, urgency, frequency, hematuria and flank pain.  Musculoskeletal: Negative for myalgias, back pain, joint pain and falls.  Skin: Negative for itching and rash.  Neurological: Negative for dizziness, tingling, tremors, sensory change, speech change, focal weakness, seizures, loss of consciousness, weakness and headaches.  Endo/Heme/Allergies: Negative for environmental allergies and polydipsia. Does not bruise/bleed easily.   SUBJECTIVE:   VITAL SIGNS: Temp:  [98.3 F (36.8 C)] 98.3 F (36.8 C) (02/25 0331) Pulse Rate:  [55-114] 55 (02/25 1200) Resp:  [13-31] 18 (02/25 1200) BP: (71-127)/(42-87) 71/61 (02/25 1200) SpO2:  [86 %-100 %] 98 % (02/25 1155) Weight:  [144 lb (65.3 kg)] 144 lb (65.3 kg)  (02/24 2255)  PHYSICAL EXAMINATION: General: adult female, appears uncomfortable sitting up on stretcher  HEENT: MM pink/dry, no jvd  PSY: anxious Neuro: AAOx4, speech clear, MAE CV: s1s2 rrr, no m/r/g, palpable radial pulses, midline incision c/d/i PULM: even/non-labored, lungs bilaterally coarse, diminished bases  JK:KXFG, non-tender, bsx4 active  Extremities: warm/dry, mild RLE edema / graft incision looks good  Skin: no rashes or lesions   Recent Labs  Lab 04/07/17 2304  NA 135  K 3.8  CL 100*  CO2 21*  BUN 19  CREATININE 0.86  GLUCOSE 123*    Recent Labs  Lab 04/07/17 2304  HGB 9.9*  HCT 30.9*  WBC 15.9*  PLT 766*    Dg Chest 2 View  Result Date: 04/07/2017 CLINICAL DATA:  Acute chest pain. EXAM: CHEST  2 VIEW COMPARISON:  03/30/2017 and prior radiographs FINDINGS: Cardiomegaly and CABG changes again noted. Mild RIGHT basilar atelectasis and trace bilateral pleural effusions again noted. There is no evidence of pulmonary edema, airspace disease or pneumothorax. No acute bony abnormalities are present. IMPRESSION: Unchanged appearance of the chest with cardiomegaly, RIGHT basilar atelectasis and trace bilateral pleural effusions. Electronically Signed   By: Margarette Canada M.D.   On: 04/07/2017 23:34   Ct Angio Chest Pe W And/or Wo Contrast  Result Date: 04/08/2017 CLINICAL DATA:  Mid sternal chest pain. EXAM: CT ANGIOGRAPHY CHEST WITH CONTRAST TECHNIQUE: Multidetector CT imaging of the chest was performed using the standard protocol during bolus administration of intravenous contrast. Multiplanar CT image  reconstructions and MIPs were obtained to evaluate the vascular anatomy. CONTRAST:  180mL ISOVUE-370 IOPAMIDOL (ISOVUE-370) INJECTION 76% COMPARISON:  Chest x-ray 04/07/2017.  Chest CT 03/24/2017. FINDINGS: Cardiovascular: No filling defects in the pulmonary arteries to suggest pulmonary emboli. Cardiomegaly. Moderate pericardial effusion, new since prior study. No evidence  of aortic aneurysm. Prior CABG. Mediastinum/Nodes: No mediastinal, hilar, or axillary adenopathy. Lungs/Pleura: Small bilateral pleural effusions, new since prior study. Airspace opacity noted in the right lower lobe with elevation of the right hemidiaphragm. This could reflect atelectasis or pneumonia. Left lower lobe linear atelectasis noted. Mild ground-glass opacities in the lungs could reflect early edema. Upper Abdomen: Pneumobilia noted within the liver, stable. Imaging into the upper abdomen shows no acute findings. Musculoskeletal: Chest wall soft tissues are unremarkable. No acute bony abnormality. Review of the MIP images confirms the above findings. IMPRESSION: No evidence of pulmonary embolus. Cardiomegaly, prior CABG. New moderate pericardial effusion and small bilateral pleural effusions. Elevation of the right hemidiaphragm. Airspace opacity at the right lung base in the right lower lobe could reflect pneumonia or atelectasis. Linear atelectasis in the left base. Scattered ground-glass opacities in the lungs, likely early edema. No evidence of pneumothorax. Electronically Signed   By: Rolm Baptise M.D.   On: 04/08/2017 02:55      SIGNIFICANT EVENTS  2/7-2/17  Admit for CABG 2/24  Admit with chest pain  STUDIES CTA Chest 2/24 >> negative for PE, new moderate pericardial effusion and small bilateral pleural effusions, elevated right hemidiaphragm, airspace opacity in right lung base (atx vs pna), linear atelectasis in left base, scattered ground glass opacities in the lungs, likely early edema, no pneumothorax.   CULTURES UC 2/25 >>  Sputum culture 2/25 >>   ANTIBIOTICS  Levaquin 2/25 >>    ASSESSMENT / PLAN:  Discussion:  57 y/o F admitted 2/25 with complaints of back pain that progressed to chest & left arm pain.  Evaluation demonstrated a new pericardial effusion and small bilateral pleural effusions.  Concern for possible Dresseler's Syndrome / pericarditis.  PE ruled out.     In addition, she does have small bilateral pleural effusions and GGO on CT.    Pericardial Effusion - concern for pericarditis / constrictive physiology, no tamponade noted on ECHO per Cardiology  Bilateral Pleural Effusions - small  Ground Glass Opacities on CT - ? Edema  Right Lung Basilar Airspace Disease - suspect atelectasis but could be infiltrate, was similar in appearance on discharge 2/17   Plan:   CVTS following, posted for possible pericardial window 2/26 One time dose of toradol, will defer further NSAIDS to CVTS / Cardiology  Admit to SDU for monitoring > hemodynamically stable currently Avoid sedating medications as able  Agree with levaquin Pulmonary hygiene - IS, mobilize  Follow intermittent CXR  Follow WBC, fever curve   PCCM will continue to monitor.     Noe Gens, NP-C [facility] Pgr: [phone] or if no answer [phone] 04/08/2017, 12:54 PM

## 2017-04-08 NOTE — ED Notes (Signed)
Patient was given a cup of Ice Chips.

## 2017-04-08 NOTE — Consult Note (Addendum)
History & Physical    Patient ID: Gabrielle Page MRN: [id number], DOB/AGE: [id number]   Admit date: 04/07/2017   Primary Physician: [name], NP Primary Cardiologist: Peter Martinique, MD  Patient Profile    S/p CABG now with shoulder pain and new pericardial effusion and pneumonia   Past Medical History    Past Medical History:  Diagnosis Date  . Asthma   . Chronic bronchitis ([place])    "usually get it q yr" (11/05/2016)  . GERD (gastroesophageal reflux disease)   . Headache    "weekly" (11/05/2016)  . Heart murmur    "dx'd when I was a child"  . Hepatitis 1991   "when I had my gallbladder out" (11/05/2016)  . History of blood transfusion 1985   S/P childbirth  . History of kidney stones   . NSTEMI (non-ST elevated myocardial infarction) ([place]) 11/05/2016   9/18 PCI/DESx1 to p/mLAD  . Pancreatitis 1991  . Pneumonia    "twice" (11/05/2016)    Past Surgical History:  Procedure Laterality Date  . ABDOMINAL HYSTERECTOMY  1990  . APPENDECTOMY  1992  . BILE DUCT EXPLORATION  1991   "reconstruction"  . CESAREAN SECTION  1985  . CHOLECYSTECTOMY OPEN  1991  . CORONARY ANGIOPLASTY WITH STENT PLACEMENT  11/05/2016  . CORONARY ARTERY BYPASS GRAFT N/A 03/26/2017   Procedure: CORONARY ARTERY BYPASS GRAFTING (CABG) x 3 WITH ENDOSCOPIC HARVESTING OF RIGHT SAPHENOUS VEIN;  Surgeon: Ivin Poot, MD;  Location: [facility];  Service: Open Heart Surgery;  Laterality: N/A;  . CORONARY STENT INTERVENTION N/A 11/05/2016   Procedure: CORONARY STENT INTERVENTION;  Surgeon: Martinique, Peter M, MD;  Location: [facility];  Service: Cardiovascular;  Laterality: N/A;  . CYSTOSCOPY WITH STENT PLACEMENT  "3 times" (11/05/2016)  . DILATION AND CURETTAGE OF UTERUS  1989  . INTRAVASCULAR ULTRASOUND/IVUS N/A 03/22/2017   Procedure: Intravascular Ultrasound/IVUS;  Surgeon: Martinique, Peter M, MD;  Location: [facility];  Service: Cardiovascular;  Laterality: N/A;  . KNEE ARTHROSCOPY Right  2000s  . LEFT HEART CATH AND CORONARY ANGIOGRAPHY N/A 11/05/2016   Procedure: LEFT HEART CATH AND CORONARY ANGIOGRAPHY;  Surgeon: Martinique, Peter M, MD;  Location: [facility];  Service: Cardiovascular;  Laterality: N/A;  . LEFT HEART CATH AND CORONARY ANGIOGRAPHY N/A 03/22/2017   Procedure: LEFT HEART CATH AND CORONARY ANGIOGRAPHY;  Surgeon: Martinique, Peter M, MD;  Location: [facility];  Service: Cardiovascular;  Laterality: N/A;  . NASAL SINUS SURGERY  ~ 2009   "exposed to mildew; had to have the infectoin drained; cut the outside of my face"  . OVARIAN CYST SURGERY  X 6  . TEE WITHOUT CARDIOVERSION N/A 03/26/2017   Procedure: TRANSESOPHAGEAL ECHOCARDIOGRAM (TEE);  Surgeon: Prescott Gum, [name], MD;  Location: [facility];  Service: Open Heart Surgery;  Laterality: N/A;     Allergies  Allergies  Allergen Reactions  . Compazine [Prochlorperazine Edisylate] Other (See Comments)    SEIZURE  . Other Other (See Comments)    Steroids cause uncontrollable coughing    History of Present Illness    57 year old woman with a past medical history of bypass x3 (left internal mammary artery to left anterior descending, saphenous vein graft to diagonal, saphenous vein graft to circumflex marginal) on February 12 of this year.  Uncomplicated postoperative course.  She now presents with left shoulder pain.  This started a few days ago. other symptoms include chest pain and inability to take deep breaths.  He has  been now at home for over a week and has able to take deep breaths and deep arrives accompanied by sharp chest pain in the sternum and slightly to the left side.  Some of the pain is also radiating to the upper abdomen.  No recent changes in medications.  No cough.  She reports no fevers chills or night sweats.  She has a sleep upright but this has been going on ever since before surgery.  No lower extreme edema.  No dyspnea on exertion.  In th ED she underwent a CT scan of her chest and was found to  have a new moderate effusion around the heart.  Also with right-sided hemidiaphragm elevation leads to be due to either atelectasis or pneumonia on that side.    Home Medications    Prior to Admission medications   Medication Sig Start Date End Date Taking? Authorizing Provider  amiodarone (PACERONE) 200 MG tablet Take 1 tablet (200 mg total) by mouth 2 (two) times daily. 03/31/17  Yes Gold, Wilder Glade, PA-C  aspirin EC 325 MG EC tablet Take 1 tablet (325 mg total) by mouth daily. 04/01/17  Yes Gold, Wayne E, PA-C  HYDROmorphone (DILAUDID) 2 MG tablet Take 1 tablet (2 mg total) by mouth every 6 (six) hours as needed for moderate pain or severe pain. 03/31/17  Yes Gold, Wayne E, PA-C  metoprolol tartrate (LOPRESSOR) 25 MG tablet Take 1 tablet (25 mg total) by mouth 2 (two) times daily. 03/31/17  Yes Gold, Wilder Glade, PA-C  Multiple Vitamin (MULTIVITAMIN WITH MINERALS) TABS tablet Take 1 tablet by mouth daily.   Yes [provider]    Family History    Family History  Problem Relation Age of Onset  . Hypertension Father   . Alcohol abuse Father   . Heart attack Father   . Diabetes Mellitus II Father   . Suicidality Mother    indicated that her mother is deceased. She indicated that her father is deceased.   Social History    Social History   Socioeconomic History  . Marital status: Divorced    Spouse name: Not on file  . Number of children: Not on file  . Years of education: Not on file  . Highest education level: Not on file  Social Needs  . Financial resource strain: Not on file  . Food insecurity - worry: Not on file  . Food insecurity - inability: Not on file  . Transportation needs - medical: Not on file  . Transportation needs - non-medical: Not on file  Occupational History  . Not on file  Tobacco Use  . Smoking status: Never Smoker  . Smokeless tobacco: Never Used  Substance and Sexual Activity  . Alcohol use: Yes    Comment: 11/05/2016 "maybe 1 glass of  wine/month"  . Drug use: No  . Sexual activity: Not Currently  Other Topics Concern  . Not on file  Social History Narrative  . Not on file     Review of Systems    General:  No chills, fever, night sweats or weight changes.  Cardiovascular:  No chest pain, dyspnea on exertion, edema, orthopnea, palpitations, paroxysmal nocturnal dyspnea. Dermatological: No rash, lesions/masses Respiratory: No cough, dyspnea Urologic: No hematuria, dysuria Abdominal:   No nausea, vomiting, diarrhea, bright red blood per rectum, melena, or hematemesis Neurologic:  No visual changes, wkns, changes in mental status. All other systems reviewed and are otherwise negative except as noted above.  Physical Exam  Blood pressure 119/68, pulse 68, temperature 98.3 F (36.8 C), temperature source Oral, resp. rate 19, height 5\' 3"  (1.6 m), weight 65.3 kg (144 lb), SpO2 93 %.  General: Pleasant, NAD Psych: Normal affect. Neuro: Alert and oriented X 3. Moves all extremities spontaneously. HEENT: Normal  Neck: Elevated JVP Lungs: Decreased breath sounds in the right side Heart: RRR no s3, s4, or murmurs.  Sternum is very tender to touch Abdomen: Soft, non-tender, non-distended, BS + x 4.  Extremities: No clubbing, cyanosis or edema. DP/PT/Radials 2+ and equal bilaterally.  Labs    Troponin [facility] of Care Test) Recent Labs    04/07/17 2316  TROPIPOC 0.03   No results for input(s): CKTOTAL, CKMB, TROPONINI in the last 72 hours. Lab Results  Component Value Date   WBC 15.9 (H) 04/07/2017   HGB 9.9 (L) 04/07/2017   HCT 30.9 (L) 04/07/2017   MCV 92.2 04/07/2017   PLT 766 (H) 04/07/2017    Recent Labs  Lab 04/07/17 2304  NA 135  K 3.8  CL 100*  CO2 21*  BUN 19  CREATININE 0.86  CALCIUM 9.0  GLUCOSE 123*   Lab Results  Component Value Date   CHOL 132 11/06/2016   HDL 36 (L) 11/06/2016   LDLCALC 54 11/06/2016   TRIG 211 (H) 11/06/2016   No results found for: [facility]   Radiology  Studies    Dg Chest 2 View  Result Date: 04/07/2017 CLINICAL DATA:  Acute chest pain. EXAM: CHEST  2 VIEW COMPARISON:  03/30/2017 and prior radiographs FINDINGS: Cardiomegaly and CABG changes again noted. Mild RIGHT basilar atelectasis and trace bilateral pleural effusions again noted. There is no evidence of pulmonary edema, airspace disease or pneumothorax. No acute bony abnormalities are present. IMPRESSION: Unchanged appearance of the chest with cardiomegaly, RIGHT basilar atelectasis and trace bilateral pleural effusions. Electronically Signed   By: Margarette Canada M.D.   On: 04/07/2017 23:34   Dg Chest 2 View  Result Date: 03/30/2017 CLINICAL DATA:  CABG 03/26/2017 EXAM: CHEST  2 VIEW COMPARISON:  03/29/2017 FINDINGS: Elevated right hemidiaphragm unchanged. Right pleural effusion and right lower lobe atelectasis unchanged. Mild left lower lobe atelectasis improved from the prior study. Minimal left effusion. Cardiac enlargement.  Mild vascular congestion without edema. IMPRESSION: Elevated right hemidiaphragm. Right lower lobe atelectasis and effusion unchanged. Improvement in mild left lower lobe atelectasis. Electronically Signed   By: Franchot Gallo M.D.   On: 03/30/2017 09:00   Dg Chest 2 View  Result Date: 03/29/2017 CLINICAL DATA:  CABG EXAM: CHEST  2 VIEW COMPARISON:  03/28/2017 FINDINGS: Central venous catheter removed. Mediastinal drain removed. No pneumothorax. Elevated right hemidiaphragm unchanged.  Bibasilar atelectasis. Improvement in pulmonary edema. Small bilateral pleural effusions have improved. IMPRESSION: Decrease in pulmonary edema. No change in bibasilar atelectasis. Improvement in small pleural effusions. Electronically Signed   By: Franchot Gallo M.D.   On: 03/29/2017 07:33   Dg Chest 2 View  Result Date: 03/23/2017 CLINICAL DATA:  Chest pain.  Preop for heart surgery EXAM: CHEST  2 VIEW COMPARISON:  None. FINDINGS: Normal heart size and mediastinal contours. Coronary stent  noted. No acute infiltrate or edema. No effusion or pneumothorax. No acute osseous findings. IMPRESSION: Negative chest. Electronically Signed   By: Monte Fantasia M.D.   On: 03/23/2017 09:33   Ct Chest W Contrast  Result Date: 03/24/2017 CLINICAL DATA:  Pleural effusion, chest pain. EXAM: CT CHEST WITH CONTRAST TECHNIQUE: Multidetector CT imaging of the chest was performed during  intravenous contrast administration. CONTRAST:  39mL ISOVUE-300 IOPAMIDOL (ISOVUE-300) INJECTION 61% COMPARISON:  Chest x-ray 03/23/2017.  Chest CT 11/05/2016. FINDINGS: Cardiovascular: Stent noted in the left anterior descending coronary artery. Heart is normal size. Aorta is normal caliber. No filling defects in the pulmonary arteries to suggest pulmonary emboli. Mediastinum/Nodes: No mediastinal, hilar, or axillary adenopathy. Trachea and esophagus are unremarkable. Small hiatal hernia. Lungs/Pleura: Lungs are clear. No focal airspace opacities or suspicious nodules. No effusions. Upper Abdomen: Pneumobilia noted within the liver and common bile duct, presumably from prior sphincterotomy. Musculoskeletal: Chest wall soft tissues are unremarkable. No acute bony abnormality. IMPRESSION: No evidence of pulmonary embolus. No acute cardiopulmonary disease. Lad stent noted. Pneumobilia, presumably from prior sphincterotomy. Recommend clinical correlation. Electronically Signed   By: Rolm Baptise M.D.   On: 03/24/2017 14:13   Ct Angio Chest Pe W And/or Wo Contrast  Result Date: 04/08/2017 CLINICAL DATA:  Mid sternal chest pain. EXAM: CT ANGIOGRAPHY CHEST WITH CONTRAST TECHNIQUE: Multidetector CT imaging of the chest was performed using the standard protocol during bolus administration of intravenous contrast. Multiplanar CT image reconstructions and MIPs were obtained to evaluate the vascular anatomy. CONTRAST:  120mL ISOVUE-370 IOPAMIDOL (ISOVUE-370) INJECTION 76% COMPARISON:  Chest x-ray 04/07/2017.  Chest CT 03/24/2017. FINDINGS:  Cardiovascular: No filling defects in the pulmonary arteries to suggest pulmonary emboli. Cardiomegaly. Moderate pericardial effusion, new since prior study. No evidence of aortic aneurysm. Prior CABG. Mediastinum/Nodes: No mediastinal, hilar, or axillary adenopathy. Lungs/Pleura: Small bilateral pleural effusions, new since prior study. Airspace opacity noted in the right lower lobe with elevation of the right hemidiaphragm. This could reflect atelectasis or pneumonia. Left lower lobe linear atelectasis noted. Mild ground-glass opacities in the lungs could reflect early edema. Upper Abdomen: Pneumobilia noted within the liver, stable. Imaging into the upper abdomen shows no acute findings. Musculoskeletal: Chest wall soft tissues are unremarkable. No acute bony abnormality. Review of the MIP images confirms the above findings. IMPRESSION: No evidence of pulmonary embolus. Cardiomegaly, prior CABG. New moderate pericardial effusion and small bilateral pleural effusions. Elevation of the right hemidiaphragm. Airspace opacity at the right lung base in the right lower lobe could reflect pneumonia or atelectasis. Linear atelectasis in the left base. Scattered ground-glass opacities in the lungs, likely early edema. No evidence of pneumothorax. Electronically Signed   By: Rolm Baptise M.D.   On: 04/08/2017 02:55   Dg Chest Port 1 View  Result Date: 03/28/2017 CLINICAL DATA:  Status post CABG 2 days ago. EXAM: PORTABLE CHEST 1 VIEW COMPARISON:  Portable chest x-ray of March 27, 2017. FINDINGS: The lung volumes are low. The hemidiaphragms are obscured. There small bilateral pleural effusions. The cardiac silhouette is enlarged and the margins indistinct. The pulmonary vascularity is prominent centrally. There is mild interstitial edema peripherally. The left chest tube is been removed. The mediastinal drain remains. There is no pneumothorax. The Swan-Ganz catheter has been withdrawn. The right internal jugular  Cordis sheath projects over the proximal SVC. IMPRESSION: CHF with mild interstitial edema. Bibasilar atelectasis or pneumonia with small bilateral pleural effusions. No pneumothorax. Electronically Signed   By: David  Martinique M.D.   On: 03/28/2017 07:39   Dg Chest Port 1 View  Result Date: 03/27/2017 CLINICAL DATA:  Status postextubation. Recent coronary artery bypass grafting EXAM: PORTABLE CHEST 1 VIEW COMPARISON:  March 26, 2017 FINDINGS: Endotracheal tube and nasogastric tube have been removed. Swan-Ganz catheter tip is in the proximal right main pulmonary artery. There is a mediastinal drain as well as a  chest tube on the left. Temporary pacemaker leads are attached to the right heart. No pneumothorax. There is atelectatic change in the left lower lobe as well as in the medial right base. Lungs elsewhere clear. Heart is upper normal in size with pulmonary vascularity within normal limits. No adenopathy. IMPRESSION: Tube and catheter positions as described. No pneumothorax. Bibasilar atelectasis. No consolidation. Stable cardiac silhouette. Electronically Signed   By: Lowella Grip III M.D.   On: 03/27/2017 08:03   Dg Chest Port 1 View  Result Date: 03/26/2017 CLINICAL DATA:  Post CABG EXAM: PORTABLE CHEST 1 VIEW COMPARISON:  03/23/2017 FINDINGS: Changes of CABG. Endotracheal tube is 4.5 cm above the carina. Left chest tube is in place without pneumothorax. NG tube enters the stomach. Swan-Ganz catheter tip is in the main Pulmonary artery. Low lung volumes with left base and perihilar atelectasis. No pneumothorax or effusions. IMPRESSION: Postoperative changes. No pneumothorax. Areasof atelectasis in the left base and perihilar regions. Electronically Signed   By: Rolm Baptise M.D.   On: 03/26/2017 14:04   US Liver Doppler  Result Date: 03/24/2017 CLINICAL DATA:  Elevated LFTs EXAM: DUPLEX ULTRASOUND OF LIVER TECHNIQUE: Color and duplex Doppler ultrasound was performed to evaluate the hepatic  in-flow and out-flow vessels. 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 IMPRESSION: Normal portal vein and  hepatic vein velocities and waveforms. Electronically Signed   By: Suzy Bouchard M.D.   On: 03/24/2017 10:48   US Abdomen Limited Ruq  Result Date: 03/24/2017 CLINICAL DATA:  Elevated liver enzymes EXAM: ULTRASOUND ABDOMEN LIMITED RIGHT UPPER QUADRANT COMPARISON:  CT 11/05/2016 FINDINGS: Gallbladder: Surgically absent Common bile duct: Diameter: Normal 3.3 mm Liver: Normal echogenicity. No biliary duct dilatation. Small amount pneumobilia in the LEFT hepatic lobe compares with prior CT. Portal vein is patent on color Doppler imaging with normal direction of blood flow towards the liver. IMPRESSION: 1. No acute findings the liver. Normal echogenicity. No biliary duct dilatation. 2. Pneumobilia presumed related to prior sphincterotomy and not changed from comparison CT Electronically Signed   By: Suzy Bouchard M.D.   On: 03/24/2017 10:53    ECG & Cardiac Imaging    Normal sinus rhythm, CT with right atelectasis or pneumonia.  New moderate effusion around the heart.  Assessment & Plan    Ms. Muldrew is status post bypass surgery less than 2 weeks ago.  She now presents with symptoms of worsening shoulder pain.  On CT evaluation she has a new moderate effusion around the heart.  Bedside ultrasound shows a pericardial effusion and a distended and non-collapsible IVC. however the imaging study was slightly complicated due to her chest tenderness functional imaging was not performed this time.  Her exam  jugular venous pressure is elevated which could suggest potential tamponade physiology.  Her pulses at bedside is 8 still within normal limits.  # Pericardial effusion:  - admit to inpatient service -TTE in the morning -Keep on telemetry with close blood pressure monitoring -Start On colchicine -Avoid diuretics at this time -Okay to restart home medications  # Atelectasis/effusion/pneumonia: She has an elevated white count.  Very  likely present a pneumonia. -Advised the emergency room provider to get blood cultures and start on antibiotics  Signed, Cristina Gong, MD 04/08/2017, 3:53 AM

## 2017-04-08 NOTE — Progress Notes (Signed)
  Echocardiogram 2D Echocardiogram has been performed.  Gabrielle Page Gabrielle Page 04/08/2017, 9:11 AM

## 2017-04-08 NOTE — Progress Notes (Signed)
Patient admitted after midnight, please see H&P.  Echo shows large pericardial effusion.  Cardiology consult as well as CVTS consult.  BP low at times.  Have changed from Tele to SDU but may need higher level of care.  There was concern for PNA-- change levaquin to HCAP coverage.  Eulogio Bear DO

## 2017-04-09 ENCOUNTER — Encounter (HOSPITAL_COMMUNITY)
Admission: EM | Disposition: A | Discharge status: Home / Self Care | Payer: Self-pay | Source: Home / Self Care | Attending: Internal Medicine

## 2017-04-09 ENCOUNTER — Other Ambulatory Visit: Payer: Self-pay | Admitting: Physician Assistant

## 2017-04-09 DIAGNOSIS — I3139 Other pericardial effusion (noninflammatory): Secondary | ICD-10-CM

## 2017-04-09 DIAGNOSIS — R071 Chest pain on breathing: Secondary | ICD-10-CM

## 2017-04-09 DIAGNOSIS — I313 Pericardial effusion (noninflammatory): Secondary | ICD-10-CM

## 2017-04-09 LAB — BASIC METABOLIC PANEL
Anion gap: 12 (ref 5–15)
BUN: 17 mg/dL (ref 6–20)
CHLORIDE: 102 mmol/L (ref 101–111)
CO2: 18 mmol/L — AB (ref 22–32)
CREATININE: 0.78 mg/dL (ref 0.44–1.00)
Calcium: 8.3 mg/dL — ABNORMAL LOW (ref 8.9–10.3)
GFR calc non Af Amer: >60 mL/min (ref >60)
Glucose, Bld: 130 mg/dL — ABNORMAL HIGH (ref 65–99)
Potassium: 3.7 mmol/L (ref 3.5–5.1)
Sodium: 132 mmol/L — ABNORMAL LOW (ref 135–145)

## 2017-04-09 LAB — CBC
HEMATOCRIT: 26.6 % — AB (ref 36.0–46.0)
HEMOGLOBIN: 8.7 g/dL — AB (ref 12.0–15.0)
MCH: 30.5 pg (ref 26.0–34.0)
MCHC: 32.7 g/dL (ref 30.0–36.0)
MCV: 93.3 fL (ref 78.0–100.0)
Platelets: 664 10*3/uL — ABNORMAL HIGH (ref 150–400)
RBC: 2.85 MIL/uL — ABNORMAL LOW (ref 3.87–5.11)
RDW: 16 % — ABNORMAL HIGH (ref 11.5–15.5)
WBC: 9.2 10*3/uL (ref 4.0–10.5)

## 2017-04-09 LAB — GLUCOSE, CAPILLARY: Glucose-Capillary: 116 mg/dL — ABNORMAL HIGH (ref 65–99)

## 2017-04-09 LAB — LEGIONELLA PNEUMOPHILA SEROGP 1 UR AG: L. pneumophila Serogp 1 Ur Ag: NEGATIVE

## 2017-04-09 LAB — HEMOGLOBIN A1C
Hgb A1c MFr Bld: 5.4 % (ref 4.8–5.6)
Mean Plasma Glucose: 108 mg/dL

## 2017-04-09 SURGERY — CREATION, PERICARDIAL WINDOW, SUBXIPHOID APPROACH
Anesthesia: General

## 2017-04-09 MED ORDER — FUROSEMIDE 20 MG PO TABS
20.0000 mg | ORAL_TABLET | Freq: Once | ORAL | Status: AC
  Administered 2017-04-09: 20 mg via ORAL
  Filled 2017-04-09: qty 1

## 2017-04-09 NOTE — Progress Notes (Signed)
D/w Dr Eliseo Squires of [facility] can sign off 04/09/2017 withiout seeing  Dr. Brand Males, M.D., [facility].C.P Pulmonary and Critical Care Medicine Staff Physician, [place] Director - Interstitial Lung Disease  Program  Pulmonary [place] at [place], [place], [address]  Pager: [phone], If no answer or between  15:00h - 7:00h: call [phone] Telephone: [phone]

## 2017-04-09 NOTE — Progress Notes (Signed)
PROGRESS NOTE    Gabrielle Page  ION:[id number] DOB: 03/16/1960 DOA: 04/07/2017 PCP: [name], NP   Outpatient Specialists:    Brief Narrative:  Gabrielle Page is a 57 y.o. 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  Pt is status post 2 weeks triple bypass surgery performed by Dr. Darcey Nora. She state that she started she has been having chest pain for several days, which has worsened today. It is located in the frontal chest and also in left upper chest close to left shoulder. She has a cough with small amount of mild pink mucus production. She has some mild shortness rest, and a fever or for 129 and chills. She also has runny nose and dry throat. The chest is constant, sharp, 10 out of 10 in severity initially, currently 5 out of 10 in severity, radiating to both shoulders. Patient states that she has nausea and vomited 3 times, but no diarrhea. She has mild epigastric abdominal cramping pain. Denies symptoms of UTI or unilateral weakness.     Assessment & Plan:   Principal Problem:   Chest pain Active Problems:   S/P CABG x 3   CAD (coronary artery disease)   Pericardial effusion   Lobar pneumonia (HCC)   GERD (gastroesophageal reflux disease)   Abdominal pain   Chest pain with post-op pericardial effusion  -recent CABG -CTA is negative for PE, but showed pericardial effusion -CVTS consult -cardiology consult  Pericardial effusion: -echo:- Left ventricle: The cavity size was normal. Wall thickness was   normal. Systolic function was vigorous. The estimated ejection   fraction was in the range of 65% to 70%. - Left atrium: The atrium was mildly dilated. - Pericardium, extracardiac: Large pericardial effusion posterior   to heart 19 mm in maximal dimension. Does not meet echo criteria   for tamponade Clinical  correlation indicated. -cardiology consult -started colchicine/ibuprofen per cards  Possible lobar pneumonia [facility]):  -Patient reports a productive cough, fever of 100.9. -CT angiogram showed right opacity, indicating possible lobar pneumonia. - HCAP abx-- narrow to oral abx in AM -culture pending  GERD: -Protonix    DVT prophylaxis:  SQ Heparin  Code Status: Full Code   Family Communication:   Disposition Plan:  Home in the AM?   Consultants:  CVTS cards  Subjective: Much improved, up walking around the room  Objective: Vitals:   04/09/17 0200 04/09/17 0327 04/09/17 0540 04/09/17 0723  BP:  125/73  99/73  Pulse: (!) 54 95  75  Resp: (!) 35 (!) 28  16  Temp:  98.9 F (37.2 C)  99.1 F (37.3 C)  TempSrc:  Oral  Oral  SpO2: 90% 100%  98%  Weight:   68.2 kg (150 lb 6.4 oz)   Height:   5\' 3"  (1.6 m)     Intake/Output Summary (Last 24 hours) at 04/09/2017 0833 Last data filed at 04/09/2017 0211 Gross per 24 hour  Intake 2550 ml  Output 275 ml  Net 2275 ml   Filed Weights   04/07/17 2255 04/09/17 0540  Weight: 65.3 kg (144 lb) 68.2 kg (150 lb 6.4 oz)    Examination:  General exam: up walking in room  Respiratory system: Clear to auscultation. Respiratory effort normal. Cardiovascular system: S1 & S2 heard, RRR. No JVD, murmurs, rubs, gallops or clicks. No pedal edema. Gastrointestinal system: Abdomen is nondistended, soft and nontender. No organomegaly or  masses felt. Normal bowel sounds heard. Central nervous system: Alert and oriented. No focal neurological deficits. Extremities: Symmetric 5 x 5 power. Skin: No rashes, lesions or ulcers Psychiatry: Judgement and insight appear normal. Mood & affect appropriate.     Data Reviewed: I have personally reviewed following labs and imaging studies  CBC: Recent Labs  Lab 04/07/17 2304  WBC 15.9*  HGB 9.9*  HCT 30.9*  MCV 92.2  PLT 347*   Basic Metabolic Panel: Recent Labs  Lab  04/07/17 2304  NA 135  K 3.8  CL 100*  CO2 21*  GLUCOSE 123*  BUN 19  CREATININE 0.86  CALCIUM 9.0   GFR: Estimated Creatinine Clearance: 69.3 mL/min (by C-G formula based on SCr of 0.86 mg/dL). Liver Function Tests: No results for input(s): AST, ALT, ALKPHOS, BILITOT, PROT, ALBUMIN in the last 168 hours. Recent Labs  Lab 04/08/17 0440  LIPASE 20   No results for input(s): AMMONIA in the last 168 hours. Coagulation Profile: Recent Labs  Lab 04/08/17 0440  INR 1.16   Cardiac Enzymes: Recent Labs  Lab 04/08/17 0440 04/08/17 0929 04/08/17 1713  TROPONINI 0.07* 0.06* 0.05*   BNP (last 3 results) No results for input(s): PROBNP in the last 8760 hours. HbA1C: Recent Labs    04/07/17 2304  HGBA1C 5.4   CBG: Recent Labs  Lab 04/08/17 2021 04/09/17 0029  GLUCAP 62* 116*   Lipid Profile: Recent Labs    04/08/17 0440  CHOL 75  HDL 20*  LDLCALC 34  TRIG 107  CHOLHDL 3.8   Thyroid Function Tests: No results for input(s): TSH, T4TOTAL, FREET4, T3FREE, THYROIDAB in the last 72 hours. Anemia Panel: No results for input(s): VITAMINB12, FOLATE, FERRITIN, TIBC, IRON, RETICCTPCT in the last 72 hours. Urine analysis:    Component Value Date/Time   COLORURINE YELLOW 04/08/2017 1311   APPEARANCEUR HAZY (A) 04/08/2017 1311   LABSPEC >1.046 (H) 04/08/2017 1311   PHURINE 6.0 04/08/2017 1311   GLUCOSEU NEGATIVE 04/08/2017 1311   HGBUR NEGATIVE 04/08/2017 1311   BILIRUBINUR NEGATIVE 04/08/2017 1311   KETONESUR NEGATIVE 04/08/2017 1311   PROTEINUR NEGATIVE 04/08/2017 1311   NITRITE NEGATIVE 04/08/2017 1311   LEUKOCYTESUR NEGATIVE 04/08/2017 1311      Recent Results (from the past 240 hour(s))  Culture, respiratory (NON-Expectorated)     Status: None (Preliminary result)   Collection Time: 04/08/17  5:46 AM  Result Value Ref Range Status   Specimen Description SPUTUM  Final   Special Requests NONE  Final   Gram Stain   Final    FEW WBC PRESENT, PREDOMINANTLY  MONONUCLEAR FEW SQUAMOUS EPITHELIAL CELLS PRESENT FEW GRAM POSITIVE RODS RARE GRAM POSITIVE COCCI IN PAIRS RARE GRAM NEGATIVE RODS RARE BUDDING YEAST SEEN Performed at [facility], [place] [address] [place], [place] [address]    Culture PENDING  Incomplete   Report Status PENDING  Incomplete  MRSA PCR Screening     Status: None   Collection Time: 04/08/17  7:17 PM  Result Value Ref Range Status   MRSA by PCR NEGATIVE NEGATIVE Final    Comment:        The GeneXpert MRSA Assay (FDA approved for NASAL specimens only), is one component of a comprehensive MRSA colonization surveillance program. It is not intended to diagnose MRSA infection nor to guide or monitor treatment for MRSA infections. Performed at [facility], [place] [address] [place], [place] [address]       Anti-infectives (From admission, onward)  Start     Dose/Rate Route Frequency Ordered Stop   04/09/17 0400  levofloxacin (LEVAQUIN) IVPB 750 mg  Status:  Discontinued     750 mg 100 mL/hr over 90 Minutes Intravenous Every 24 hours 04/08/17 0441 04/08/17 0827   04/08/17 2100  vancomycin (VANCOCIN) IVPB 750 mg/150 ml premix     750 mg 150 mL/hr over 60 Minutes Intravenous Every 12 hours 04/08/17 0833     04/08/17 0845  vancomycin (VANCOCIN) 1,250 mg in sodium chloride 0.9 % 250 mL IVPB     1,250 mg 166.7 mL/hr over 90 Minutes Intravenous  Once 04/08/17 0833 04/08/17 1110   04/08/17 0830  ceFEPIme (MAXIPIME) 1 g in sodium chloride 0.9 % 100 mL IVPB     1 g 200 mL/hr over 30 Minutes Intravenous Every 8 hours 04/08/17 0827 04/16/17 0559   04/08/17 0400  levofloxacin (LEVAQUIN) IVPB 750 mg     750 mg 100 mL/hr over 90 Minutes Intravenous  Once 04/08/17 0353 04/08/17 0546       Radiology Studies: Dg Chest 2 View  Result Date: 04/07/2017 CLINICAL DATA:  Acute chest pain. EXAM: CHEST  2 VIEW COMPARISON:  03/30/2017 and prior radiographs FINDINGS: Cardiomegaly and CABG changes again noted. Mild RIGHT  basilar atelectasis and trace bilateral pleural effusions again noted. There is no evidence of pulmonary edema, airspace disease or pneumothorax. No acute bony abnormalities are present. IMPRESSION: Unchanged appearance of the chest with cardiomegaly, RIGHT basilar atelectasis and trace bilateral pleural effusions. Electronically Signed   By: Margarette Canada M.D.   On: 04/07/2017 23:34   Ct Angio Chest Pe W And/or Wo Contrast  Result Date: 04/08/2017 CLINICAL DATA:  Mid sternal chest pain. EXAM: CT ANGIOGRAPHY CHEST WITH CONTRAST TECHNIQUE: Multidetector CT imaging of the chest was performed using the standard protocol during bolus administration of intravenous contrast. Multiplanar CT image reconstructions and MIPs were obtained to evaluate the vascular anatomy. CONTRAST:  163mL ISOVUE-370 IOPAMIDOL (ISOVUE-370) INJECTION 76% COMPARISON:  Chest x-ray 04/07/2017.  Chest CT 03/24/2017. FINDINGS: Cardiovascular: No filling defects in the pulmonary arteries to suggest pulmonary emboli. Cardiomegaly. Moderate pericardial effusion, new since prior study. No evidence of aortic aneurysm. Prior CABG. Mediastinum/Nodes: No mediastinal, hilar, or axillary adenopathy. Lungs/Pleura: Small bilateral pleural effusions, new since prior study. Airspace opacity noted in the right lower lobe with elevation of the right hemidiaphragm. This could reflect atelectasis or pneumonia. Left lower lobe linear atelectasis noted. Mild ground-glass opacities in the lungs could reflect early edema. Upper Abdomen: Pneumobilia noted within the liver, stable. Imaging into the upper abdomen shows no acute findings. Musculoskeletal: Chest wall soft tissues are unremarkable. No acute bony abnormality. Review of the MIP images confirms the above findings. IMPRESSION: No evidence of pulmonary embolus. Cardiomegaly, prior CABG. New moderate pericardial effusion and small bilateral pleural effusions. Elevation of the right hemidiaphragm. Airspace opacity  at the right lung base in the right lower lobe could reflect pneumonia or atelectasis. Linear atelectasis in the left base. Scattered ground-glass opacities in the lungs, likely early edema. No evidence of pneumothorax. Electronically Signed   By: Rolm Baptise M.D.   On: 04/08/2017 02:55        Scheduled Meds: . amiodarone  200 mg Oral BID  . aspirin  325 mg Oral Daily  . colchicine  0.6 mg Oral BID  . heparin  5,000 Units Subcutaneous Q8H  . metoprolol tartrate  25 mg Oral BID  . multivitamin with minerals  1 tablet Oral Daily  Continuous Infusions: . ceFEPime (MAXIPIME) IV 1 g (04/09/17 0542)  . vancomycin Stopped (04/09/17 0155)     LOS: 1 day    Time spent: 25 min    Geradine Girt, DO [facility] Pager [phone]  If 7PM-7AM, please contact night-coverage [email or web address] Password [facility] 04/09/2017, 8:33 AM

## 2017-04-09 NOTE — Progress Notes (Addendum)
Progress Note  Patient Name: Gabrielle Page Date of Encounter: 04/09/2017  Primary Cardiologist: Peter Martinique, MD   Subjective   Feels much better today with near resolution of CP and up walking in halls.  Inpatient Medications    Scheduled Meds: . amiodarone  200 mg Oral BID  . aspirin  325 mg Oral Daily  . colchicine  0.6 mg Oral BID  . heparin  5,000 Units Subcutaneous Q8H  . metoprolol tartrate  25 mg Oral BID  . multivitamin with minerals  1 tablet Oral Daily   Continuous Infusions: . ceFEPime (MAXIPIME) IV 1 g (04/09/17 0542)  . vancomycin Stopped (04/09/17 1119)   PRN Meds: albuterol, ALPRAZolam, dextromethorphan-guaiFENesin, HYDROmorphone, ibuprofen, ketorolac, ondansetron (ZOFRAN) IV, pantoprazole, zolpidem   Vital Signs    Vitals:   04/09/17 1019 04/09/17 1203 04/09/17 1217 04/09/17 1219  BP: (!) 95/54 (!) 90/59 125/65   Pulse: 71 68  71  Resp:  (!) 23    Temp:  98.6 F (37 C)    TempSrc:  Oral    SpO2:  98%    Weight:      Height:        Intake/Output Summary (Last 24 hours) at 04/09/2017 1408 Last data filed at 04/09/2017 0900 Gross per 24 hour  Intake 2300 ml  Output 175 ml  Net 2125 ml   Filed Weights   04/07/17 2255 04/09/17 0540  Weight: 144 lb (65.3 kg) 150 lb 6.4 oz (68.2 kg)    Telemetry    NSR - Personally Reviewed  ECG    No new EKG to review - Personally Reviewed  Physical Exam   GEN: No acute distress.   Neck: No JVD Cardiac: RRR, no murmurs, rubs, or gallops.  Respiratory: Clear to auscultation bilaterally. GI: Soft, nontender, non-distended  MS: No edema; No deformity. Neuro:  Nonfocal  Psych: Normal affect   Labs    Chemistry Recent Labs  Lab 04/07/17 2304 04/09/17 0827  NA 135 132*  K 3.8 3.7  CL 100* 102  CO2 21* 18*  GLUCOSE 123* 130*  BUN 19 17  CREATININE 0.86 0.78  CALCIUM 9.0 8.3*  GFRNONAA >60 >60  GFRAA >60 >60  ANIONGAP 14 12     Hematology Recent Labs  Lab 04/07/17 2304  04/09/17 0827  WBC 15.9* 9.2  RBC 3.35* 2.85*  HGB 9.9* 8.7*  HCT 30.9* 26.6*  MCV 92.2 93.3  MCH 29.6 30.5  MCHC 32.0 32.7  RDW 16.2* 16.0*  PLT 766* 664*    Cardiac Enzymes Recent Labs  Lab 04/08/17 0440 04/08/17 0929 04/08/17 1713  TROPONINI 0.07* 0.06* 0.05*    Recent Labs  Lab 04/07/17 2316  TROPIPOC 0.03     BNPNo results for input(s): BNP, PROBNP in the last 168 hours.   DDimer No results for input(s): DDIMER in the last 168 hours.   Radiology    Dg Chest 2 View  Result Date: 04/07/2017 CLINICAL DATA:  Acute chest pain. EXAM: CHEST  2 VIEW COMPARISON:  03/30/2017 and prior radiographs FINDINGS: Cardiomegaly and CABG changes again noted. Mild RIGHT basilar atelectasis and trace bilateral pleural effusions again noted. There is no evidence of pulmonary edema, airspace disease or pneumothorax. No acute bony abnormalities are present. IMPRESSION: Unchanged appearance of the chest with cardiomegaly, RIGHT basilar atelectasis and trace bilateral pleural effusions. Electronically Signed   By: Margarette Canada M.D.   On: 04/07/2017 23:34   Ct Angio Chest Pe W And/or Wo Contrast  Result  Date: 04/08/2017 CLINICAL DATA:  Mid sternal chest pain. EXAM: CT ANGIOGRAPHY CHEST WITH CONTRAST TECHNIQUE: Multidetector CT imaging of the chest was performed using the standard protocol during bolus administration of intravenous contrast. Multiplanar CT image reconstructions and MIPs were obtained to evaluate the vascular anatomy. CONTRAST:  151mL ISOVUE-370 IOPAMIDOL (ISOVUE-370) INJECTION 76% COMPARISON:  Chest x-ray 04/07/2017.  Chest CT 03/24/2017. FINDINGS: Cardiovascular: No filling defects in the pulmonary arteries to suggest pulmonary emboli. Cardiomegaly. Moderate pericardial effusion, new since prior study. No evidence of aortic aneurysm. Prior CABG. Mediastinum/Nodes: No mediastinal, hilar, or axillary adenopathy. Lungs/Pleura: Small bilateral pleural effusions, new since prior study.  Airspace opacity noted in the right lower lobe with elevation of the right hemidiaphragm. This could reflect atelectasis or pneumonia. Left lower lobe linear atelectasis noted. Mild ground-glass opacities in the lungs could reflect early edema. Upper Abdomen: Pneumobilia noted within the liver, stable. Imaging into the upper abdomen shows no acute findings. Musculoskeletal: Chest wall soft tissues are unremarkable. No acute bony abnormality. Review of the MIP images confirms the above findings. IMPRESSION: No evidence of pulmonary embolus. Cardiomegaly, prior CABG. New moderate pericardial effusion and small bilateral pleural effusions. Elevation of the right hemidiaphragm. Airspace opacity at the right lung base in the right lower lobe could reflect pneumonia or atelectasis. Linear atelectasis in the left base. Scattered ground-glass opacities in the lungs, likely early edema. No evidence of pneumothorax. Electronically Signed   By: Rolm Baptise M.D.   On: 04/08/2017 02:55    Cardiac Studies  2D echo 03/2017 Study Conclusions  - Left ventricle: The cavity size was normal. Wall thickness was   normal. Systolic function was vigorous. The estimated ejection   fraction was in the range of 65% to 70%. - Left atrium: The atrium was mildly dilated. - Pericardium, extracardiac: Large pericardial effusion posterior   to heart 19 mm in maximal dimension. Does not meet echo criteria   for tamponade Clinical correlation indicated.  Patient Profile     57 y.o. female  w/ hx CABG 03/26/2017 w/ LIMA-LAD, SVG-Diag, SVG-OM, GERD, chronic bronchitis, asthma was admitted 02/24 w/ L shoulder pain, abd pain, ?PNA, new peric effusion.    Assessment & Plan    1.  Pleuritic Chest pain - initial trop minimally elevated and likely related to pericardial effusion as well as demand ischemia from possible PNA - ECG not acute - 2D echo with EF 65-70% with no WMAs - small pericardial effusion on echo that is mainly  posterior related to recent CABG and possible viral URI  2.  Pericardial effusion - echo read out as large effusion with no tamponade but I personally reviewed echo and appears to have a small to moderate posterior effusion with no tamponade - colchicine and NSAIDs started with significant improvement in symptomatic pleuritic CP. - continue colchicine 0.6mg  BID  - can send out on high dose ASA or NSADS for 2 weeks until seen in our office  3.  ASCAD s/p recent CABG - minimal elevation in tropwith flat trend related to demand ischemia in the setting of URI and pericardial effusion - denies any anginal CP  4.  Lobar PNA - on antibx per [facility]  Will sign off.  Call with any questions  For questions or updates, please contact [place] Please consult [email or web address] for contact info under Cardiology/STEMI.      Signed, Fransico Him, MD  04/09/2017, 2:08 PM

## 2017-04-09 NOTE — Progress Notes (Signed)
Procedure(s) (LRB): SUBXYPHOID PERICARDIAL WINDOW (N/A) TRANSESOPHAGEAL ECHOCARDIOGRAM (TEE) (N/A) Subjective: Patient examined, she feels much better with anti-inflammatory medication. The sternal incision is well-healed. The leg incision is well-healed. Pulmonary exam is unremarkable Agree with plans for colchicine and NSAID before possible postop pericarditis with mild posterior pericardial effusion which does not need drainage Recommend observation and discharge in a.m. Objective: Vital signs in last 24 hours: Temp:  [98 F (36.7 C)-99.1 F (37.3 C)] 98.3 F (36.8 C) (02/26 1524) Pulse Rate:  [54-95] 68 (02/26 1524) Cardiac Rhythm: Normal sinus rhythm (02/26 0700) Resp:  [16-35] 22 (02/26 1524) BP: (90-143)/(54-74) 111/74 (02/26 1524) SpO2:  [90 %-100 %] 93 % (02/26 1524) Weight:  [150 lb 6.4 oz (68.2 kg)] 150 lb 6.4 oz (68.2 kg) (02/26 0540)  Hemodynamic parameters for last 24 hours:    Intake/Output from previous day: 02/25 0701 - 02/26 0700 In: 2550 [P.O.:840; I.V.:1110; IV Piggyback:600] Out: 375 [Urine:375] Intake/Output this shift: No intake/output data recorded.  Alert and comfortable Sinus rhythm Surgical incisions clean and dry No sternal instability  Lab Results: Recent Labs    04/07/17 2304 04/09/17 0827  WBC 15.9* 9.2  HGB 9.9* 8.7*  HCT 30.9* 26.6*  PLT 766* 664*   BMET:  Recent Labs    04/07/17 2304 04/09/17 0827  NA 135 132*  K 3.8 3.7  CL 100* 102  CO2 21* 18*  GLUCOSE 123* 130*  BUN 19 17  CREATININE 0.86 0.78  CALCIUM 9.0 8.3*    PT/INR:  Recent Labs    04/08/17 0440  LABPROT 14.7  INR 1.16   ABG    Component Value Date/Time   PHART 7.347 (L) 03/26/2017 1826   HCO3 25.0 03/26/2017 1826   TCO2 29 03/28/2017 1523   ACIDBASEDEF 1.0 03/26/2017 1826   O2SAT 96.0 03/26/2017 1826   CBG (last 3)  Recent Labs    04/08/17 2021 04/09/17 0029  GLUCAP 62* 116*    Assessment/Plan: S/P Procedure(s) (LRB): SUBXYPHOID  PERICARDIAL WINDOW (N/A) TRANSESOPHAGEAL ECHOCARDIOGRAM (TEE) (N/A) Urinalysis negative Will follow patient in office at scheduled post CABG appointment   LOS: 1 day    Tharon Aquas Trigt III 04/09/2017

## 2017-04-10 LAB — URINE CULTURE: Culture: NO GROWTH

## 2017-04-10 LAB — CULTURE, RESPIRATORY W GRAM STAIN: Culture: NORMAL

## 2017-04-10 MED ORDER — DOXYCYCLINE HYCLATE 100 MG PO TABS: 100.0000 mg | ORAL_TABLET | Freq: Two times a day (BID) | ORAL | 0 refills | Status: DC

## 2017-04-10 MED ORDER — COLCHICINE 0.6 MG PO TABS: 0.6000 mg | ORAL_TABLET | Freq: Two times a day (BID) | ORAL | 0 refills | Status: DC

## 2017-04-10 MED ORDER — IBUPROFEN 400 MG PO TABS: 400.0000 mg | ORAL_TABLET | Freq: Four times a day (QID) | ORAL | 0 refills | Status: DC | PRN

## 2017-04-10 MED ORDER — DOXYCYCLINE HYCLATE 100 MG PO TABS
100.0000 mg | ORAL_TABLET | Freq: Two times a day (BID) | ORAL | Status: DC
  Administered 2017-04-10: 100 mg via ORAL
  Filled 2017-04-10: qty 1

## 2017-04-10 MED ORDER — LEVOFLOXACIN 750 MG PO TABS: 750.0000 mg | ORAL_TABLET | Freq: Every day | ORAL | Status: DC

## 2017-04-10 MED ORDER — TRAMADOL HCL 50 MG PO TABS: 50.0000 mg | ORAL_TABLET | Freq: Four times a day (QID) | ORAL | 0 refills | Status: DC | PRN

## 2017-04-10 NOTE — Discharge Summary (Signed)
Physician Discharge Summary  Gabrielle Page VFI:[id number] DOB: 20-Mar-1960 DOA: 04/07/2017  PCP: [name], NP  Admit date: 04/07/2017 Discharge date: 04/10/2017   Recommendations for Outpatient Follow-Up:   1. NSAID short term along with colchicine 2. BMP 1 week 3. Close follow up with CVTS 4. Final blood and sputum cultures pending   Discharge Diagnosis:   Principal Problem:   Chest pain Active Problems:   S/P CABG x 3   CAD (coronary artery disease)   Pericardial effusion   Lobar pneumonia (HCC)   GERD (gastroesophageal reflux disease)   Abdominal pain   Discharge disposition:  Home.   Discharge Condition: Improved.  Diet recommendation: Low sodium, heart healthy  Wound care: None.   History of Present Illness:    Gabrielle Page is a 57 y.o. 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  Pt is status post 2 weeks triple bypass surgery performed by Dr. Darcey Nora. She state that she started she has been having chest pain for several days, which has worsened today. It is located in the frontal chest and also in left upper chest close to left shoulder. She has a cough with small amount of mild pink mucus production. She has some mild shortness rest, and a fever or for 129 and chills. She also has runny nose and dry throat. The chest is constant, sharp, 10 out of 10 in severity initially, currently 5 out of 10 in severity, radiating to both shoulders. Patient states that she has nausea and vomited 3 times, but no diarrhea. She has mild epigastric abdominal cramping pain. Denies symptoms of UTI or unilateral weakness.     Hospital Course by Problem:   Chest painwith post-op pericardial effusion/pericarditis -recent CABG -CTAis negative for PE, but showed pericardial effusion -CVTS consult appreciated -cardiology  consult appreciated  Pericardial effusion: -echo:- Left ventricle: The cavity size was normal. Wall thickness was normal. Systolic function was vigorous. The estimated ejection fraction was in the range of 65% to 70%. - Left atrium: The atrium was mildly dilated. - Pericardium, extracardiac: Large pericardial effusion posterior to heart 19 mm in maximal dimension. Does not meet echo criteria for tamponade Clinical correlation indicated. -cardiology consult apprecaiated -started colchicine/ibuprofen per cards  Possible lobar pneumonia [facility]): -Patient reports a productive cough, fever of 100.9. -CT angiogram showed right opacity,indicating possible lobar pneumonia. - HCAP abx-- narrow to oral abx to finish treatment (doxy) -culture pending  GERD: -Protonix      Medical Consultants:    CVTS  PCCM  cards   Discharge Exam:   Vitals:   04/10/17 0400 04/10/17 0741  BP: (!) 150/73 (!) 146/99  Pulse: 93 65  Resp: (!) 23 17  Temp: 98.1 F (36.7 C) 98.8 F (37.1 C)  SpO2:  99%   Vitals:   04/09/17 2122 04/10/17 0004 04/10/17 0400 04/10/17 0741  BP:  123/65 (!) 150/73 (!) 146/99  Pulse: 66 65 93 65  Resp:  (!) 21 (!) 23 17  Temp:  98 F (36.7 C) 98.1 F (36.7 C) 98.8 F (37.1 C)  TempSrc:  Oral Oral Oral  SpO2:  100%  99%  Weight:      Height:        Gen:  NAD- has been up walking around room  The results of significant diagnostics from this hospitalization (including imaging, microbiology, ancillary and laboratory) are listed below for reference.  Procedures and Diagnostic Studies:   Dg Chest 2 View  Result Date: 04/07/2017 CLINICAL DATA:  Acute chest pain. EXAM: CHEST  2 VIEW COMPARISON:  03/30/2017 and prior radiographs FINDINGS: Cardiomegaly and CABG changes again noted. Mild RIGHT basilar atelectasis and trace bilateral pleural effusions again noted. There is no evidence of pulmonary edema, airspace disease or pneumothorax. No acute  bony abnormalities are present. IMPRESSION: Unchanged appearance of the chest with cardiomegaly, RIGHT basilar atelectasis and trace bilateral pleural effusions. Electronically Signed   By: Margarette Canada M.D.   On: 04/07/2017 23:34   Ct Angio Chest Pe W And/or Wo Contrast  Result Date: 04/08/2017 CLINICAL DATA:  Mid sternal chest pain. EXAM: CT ANGIOGRAPHY CHEST WITH CONTRAST TECHNIQUE: Multidetector CT imaging of the chest was performed using the standard protocol during bolus administration of intravenous contrast. Multiplanar CT image reconstructions and MIPs were obtained to evaluate the vascular anatomy. CONTRAST:  163mL ISOVUE-370 IOPAMIDOL (ISOVUE-370) INJECTION 76% COMPARISON:  Chest x-ray 04/07/2017.  Chest CT 03/24/2017. FINDINGS: Cardiovascular: No filling defects in the pulmonary arteries to suggest pulmonary emboli. Cardiomegaly. Moderate pericardial effusion, new since prior study. No evidence of aortic aneurysm. Prior CABG. Mediastinum/Nodes: No mediastinal, hilar, or axillary adenopathy. Lungs/Pleura: Small bilateral pleural effusions, new since prior study. Airspace opacity noted in the right lower lobe with elevation of the right hemidiaphragm. This could reflect atelectasis or pneumonia. Left lower lobe linear atelectasis noted. Mild ground-glass opacities in the lungs could reflect early edema. Upper Abdomen: Pneumobilia noted within the liver, stable. Imaging into the upper abdomen shows no acute findings. Musculoskeletal: Chest wall soft tissues are unremarkable. No acute bony abnormality. Review of the MIP images confirms the above findings. IMPRESSION: No evidence of pulmonary embolus. Cardiomegaly, prior CABG. New moderate pericardial effusion and small bilateral pleural effusions. Elevation of the right hemidiaphragm. Airspace opacity at the right lung base in the right lower lobe could reflect pneumonia or atelectasis. Linear atelectasis in the left base. Scattered ground-glass opacities  in the lungs, likely early edema. No evidence of pneumothorax. Electronically Signed   By: Rolm Baptise M.D.   On: 04/08/2017 02:55     Labs:   Basic Metabolic Panel: Recent Labs  Lab 04/07/17 2304 04/09/17 0827  NA 135 132*  K 3.8 3.7  CL 100* 102  CO2 21* 18*  GLUCOSE 123* 130*  BUN 19 17  CREATININE 0.86 0.78  CALCIUM 9.0 8.3*   GFR Estimated Creatinine Clearance: 74.5 mL/min (by C-G formula based on SCr of 0.78 mg/dL). Liver Function Tests: No results for input(s): AST, ALT, ALKPHOS, BILITOT, PROT, ALBUMIN in the last 168 hours. Recent Labs  Lab 04/08/17 0440  LIPASE 20   No results for input(s): AMMONIA in the last 168 hours. Coagulation profile Recent Labs  Lab 04/08/17 0440  INR 1.16    CBC: Recent Labs  Lab 04/07/17 2304 04/09/17 0827  WBC 15.9* 9.2  HGB 9.9* 8.7*  HCT 30.9* 26.6*  MCV 92.2 93.3  PLT 766* 664*   Cardiac Enzymes: Recent Labs  Lab 04/08/17 0440 04/08/17 0929 04/08/17 1713  TROPONINI 0.07* 0.06* 0.05*   BNP: Invalid input(s): POCBNP CBG: Recent Labs  Lab 04/08/17 2021 04/09/17 0029  GLUCAP 62* 116*   D-Dimer No results for input(s): DDIMER in the last 72 hours. Hgb A1c Recent Labs    04/07/17 2304  HGBA1C 5.4   Lipid Profile Recent Labs    04/08/17 0440  CHOL 75  HDL 20*  LDLCALC 34  TRIG 107  CHOLHDL 3.8   Thyroid function studies No results for input(s): TSH, T4TOTAL, T3FREE, THYROIDAB in the last 72 hours.  Invalid input(s): FREET3 Anemia work up No results for input(s): VITAMINB12, FOLATE, FERRITIN, TIBC, IRON, RETICCTPCT in the last 72 hours. Microbiology Recent Results (from the past 240 hour(s))  Blood culture (routine x 2)     Status: None (Preliminary result)   Collection Time: 04/08/17  4:05 AM  Result Value Ref Range Status   Specimen Description BLOOD LEFT HAND  Final   Special Requests   Final    BOTTLES DRAWN AEROBIC AND ANAEROBIC Blood Culture adequate volume   Culture   Final    NO  GROWTH 1 DAY Performed at [facility], [address] [place], [place] [address]    Report Status PENDING  Incomplete  Blood culture (routine x 2)     Status: None (Preliminary result)   Collection Time: 04/08/17  4:15 AM  Result Value Ref Range Status   Specimen Description BLOOD RIGHT HAND  Final   Special Requests IN PEDIATRIC BOTTLE Blood Culture adequate volume  Final   Culture   Final    NO GROWTH 1 DAY Performed at [facility], [place] [address] [place], [place] [address]    Report Status PENDING  Incomplete  Culture, respiratory (NON-Expectorated)     Status: None (Preliminary result)   Collection Time: 04/08/17  5:46 AM  Result Value Ref Range Status   Specimen Description SPUTUM  Final   Special Requests NONE  Final   Gram Stain   Final    FEW WBC PRESENT, PREDOMINANTLY MONONUCLEAR FEW SQUAMOUS EPITHELIAL CELLS PRESENT FEW GRAM POSITIVE RODS RARE GRAM POSITIVE COCCI IN PAIRS RARE GRAM NEGATIVE RODS RARE BUDDING YEAST SEEN    Culture   Final    CULTURE REINCUBATED FOR BETTER GROWTH Performed at [facility], [place] [address] [place], [place] [address]    Report Status PENDING  Incomplete  Urine Culture     Status: None   Collection Time: 04/08/17  1:26 PM  Result Value Ref Range Status   Specimen Description URINE, CLEAN CATCH  Final   Special Requests NONE  Final   Culture   Final    NO GROWTH Performed at [facility], [place] [address], [place], [place] [address]    Report Status 04/10/2017 FINAL  Final  MRSA PCR Screening     Status: None   Collection Time: 04/08/17  7:17 PM  Result Value Ref Range Status   MRSA by PCR NEGATIVE NEGATIVE Final    Comment:        The GeneXpert MRSA Assay (FDA approved for NASAL specimens only), is one component of a comprehensive MRSA colonization surveillance program. It is not intended to diagnose MRSA infection nor to guide or monitor treatment for MRSA infections. Performed at [facility], [place] [address], [place], [place] [address]      Discharge Instructions:   Discharge Instructions    Diet - low sodium heart healthy   Complete by:  As directed    Increase activity slowly   Complete by:  As directed      Allergies as of 04/10/2017      Reactions   Compazine [prochlorperazine Edisylate] Other (See Comments)   SEIZURE   Other Other (See Comments)   Steroids cause uncontrollable coughing      Medication List    TAKE these medications   amiodarone 200  MG tablet Commonly known as:  PACERONE Take 1 tablet (200 mg total) by mouth 2 (two) times daily.   aspirin 325 MG EC tablet Take 1 tablet (325 mg total) by mouth daily.   colchicine 0.6 MG tablet Take 1 tablet (0.6 mg total) by mouth 2 (two) times daily.   doxycycline 100 MG tablet Commonly known as:  VIBRA-TABS Take 1 tablet (100 mg total) by mouth every 12 (twelve) hours.   HYDROmorphone 2 MG tablet Commonly known as:  DILAUDID Take 1 tablet (2 mg total) by mouth every 6 (six) hours as needed for moderate pain or severe pain.   ibuprofen 400 MG tablet Commonly known as:  ADVIL,MOTRIN Take 1 tablet (400 mg total) by mouth every 6 (six) hours as needed for fever, headache or mild pain.   metoprolol tartrate 25 MG tablet Commonly known as:  LOPRESSOR Take 1 tablet (25 mg total) by mouth 2 (two) times daily.   multivitamin with minerals Tabs tablet Take 1 tablet by mouth daily.   traMADol 50 MG tablet Commonly known as:  ULTRAM Take 1 tablet (50 mg total) by mouth every 6 (six) hours as needed.      Follow-up Information    [place], Peter M, MD Follow up.   Specialty:  Cardiology Why:  cardiology office will contact you to arrange 2 weeks outpatient visit. Please give [place] a call if you do not hear from [place] in 3 business days. Office scheduler will also arrange the 2 week limited echo for the day prior to your followup Contact information: [address] [place]   [address] [place] Follow up on 04/17/2017.   Why:  10 am for hospital follow up Contact information: [address] [place] [address] [phone]           Time coordinating discharge: 35 min  Signed:  Geradine Girt   [facility] 04/10/2017, 8:27 AM

## 2017-04-11 ENCOUNTER — Ambulatory Visit: Payer: Medicaid Other | Admitting: Physician Assistant

## 2017-04-12 ENCOUNTER — Encounter (HOSPITAL_COMMUNITY): Payer: Self-pay

## 2017-04-12 ENCOUNTER — Telehealth: Payer: Self-pay | Admitting: Cardiology

## 2017-04-12 ENCOUNTER — Emergency Department (HOSPITAL_COMMUNITY): Payer: Self-pay

## 2017-04-12 ENCOUNTER — Other Ambulatory Visit: Payer: Self-pay

## 2017-04-12 ENCOUNTER — Inpatient Hospital Stay (HOSPITAL_COMMUNITY)
Admission: EM | Admit: 2017-04-12 | Discharge: 2017-04-14 | DRG: 314 | Disposition: A | Discharge status: Home / Self Care | Payer: Self-pay | Attending: Family Medicine | Admitting: Family Medicine

## 2017-04-12 DIAGNOSIS — I25119 Atherosclerotic heart disease of native coronary artery with unspecified angina pectoris: Secondary | ICD-10-CM | POA: Diagnosis present

## 2017-04-12 DIAGNOSIS — Z888 Allergy status to other drugs, medicaments and biological substances status: Secondary | ICD-10-CM

## 2017-04-12 DIAGNOSIS — J181 Lobar pneumonia, unspecified organism: Secondary | ICD-10-CM | POA: Diagnosis present

## 2017-04-12 DIAGNOSIS — I2510 Atherosclerotic heart disease of native coronary artery without angina pectoris: Secondary | ICD-10-CM | POA: Diagnosis present

## 2017-04-12 DIAGNOSIS — Z87442 Personal history of urinary calculi: Secondary | ICD-10-CM

## 2017-04-12 DIAGNOSIS — Z7982 Long term (current) use of aspirin: Secondary | ICD-10-CM

## 2017-04-12 DIAGNOSIS — K219 Gastro-esophageal reflux disease without esophagitis: Secondary | ICD-10-CM | POA: Diagnosis present

## 2017-04-12 DIAGNOSIS — I2583 Coronary atherosclerosis due to lipid rich plaque: Secondary | ICD-10-CM

## 2017-04-12 DIAGNOSIS — I3139 Other pericardial effusion (noninflammatory): Secondary | ICD-10-CM | POA: Diagnosis present

## 2017-04-12 DIAGNOSIS — J440 Chronic obstructive pulmonary disease with acute lower respiratory infection: Secondary | ICD-10-CM | POA: Diagnosis present

## 2017-04-12 DIAGNOSIS — Z955 Presence of coronary angioplasty implant and graft: Secondary | ICD-10-CM

## 2017-04-12 DIAGNOSIS — Z951 Presence of aortocoronary bypass graft: Secondary | ICD-10-CM

## 2017-04-12 DIAGNOSIS — Z79899 Other long term (current) drug therapy: Secondary | ICD-10-CM

## 2017-04-12 DIAGNOSIS — E8770 Fluid overload, unspecified: Secondary | ICD-10-CM | POA: Diagnosis present

## 2017-04-12 DIAGNOSIS — R197 Diarrhea, unspecified: Secondary | ICD-10-CM

## 2017-04-12 DIAGNOSIS — E785 Hyperlipidemia, unspecified: Secondary | ICD-10-CM | POA: Diagnosis present

## 2017-04-12 DIAGNOSIS — I313 Pericardial effusion (noninflammatory): Principal | ICD-10-CM | POA: Diagnosis present

## 2017-04-12 DIAGNOSIS — R112 Nausea with vomiting, unspecified: Secondary | ICD-10-CM | POA: Diagnosis present

## 2017-04-12 DIAGNOSIS — I4891 Unspecified atrial fibrillation: Secondary | ICD-10-CM | POA: Diagnosis present

## 2017-04-12 DIAGNOSIS — R040 Epistaxis: Secondary | ICD-10-CM | POA: Diagnosis present

## 2017-04-12 DIAGNOSIS — I252 Old myocardial infarction: Secondary | ICD-10-CM

## 2017-04-12 DIAGNOSIS — R0602 Shortness of breath: Secondary | ICD-10-CM

## 2017-04-12 LAB — COMPREHENSIVE METABOLIC PANEL
ALK PHOS: 153 U/L — AB (ref 38–126)
ALT: 93 U/L — AB (ref 14–54)
AST: 40 U/L (ref 15–41)
Albumin: 2.9 g/dL — ABNORMAL LOW (ref 3.5–5.0)
Anion gap: 14 (ref 5–15)
BUN: 15 mg/dL (ref 6–20)
CALCIUM: 8.7 mg/dL — AB (ref 8.9–10.3)
CHLORIDE: 107 mmol/L (ref 101–111)
CO2: 16 mmol/L — AB (ref 22–32)
CREATININE: 0.76 mg/dL (ref 0.44–1.00)
GFR calc non Af Amer: >60 mL/min (ref >60)
Glucose, Bld: 86 mg/dL (ref 65–99)
Potassium: 3.8 mmol/L (ref 3.5–5.1)
SODIUM: 137 mmol/L (ref 135–145)
Total Bilirubin: 0.6 mg/dL (ref 0.3–1.2)
Total Protein: 6.3 g/dL — ABNORMAL LOW (ref 6.5–8.1)

## 2017-04-12 LAB — CBC WITH DIFFERENTIAL/PLATELET
BASOS PCT: 0 %
Basophils Absolute: 0 10*3/uL (ref 0.0–0.1)
EOS ABS: 0.1 10*3/uL (ref 0.0–0.7)
Eosinophils Relative: 1 %
HCT: 30.5 % — ABNORMAL LOW (ref 36.0–46.0)
Hemoglobin: 9.3 g/dL — ABNORMAL LOW (ref 12.0–15.0)
LYMPHS ABS: 1.1 10*3/uL (ref 0.7–4.0)
Lymphocytes Relative: 13 %
MCH: 29 pg (ref 26.0–34.0)
MCHC: 30.5 g/dL (ref 30.0–36.0)
MCV: 95 fL (ref 78.0–100.0)
MONO ABS: 0.5 10*3/uL (ref 0.1–1.0)
MONOS PCT: 6 %
Neutro Abs: 6.6 10*3/uL (ref 1.7–7.7)
Neutrophils Relative %: 80 %
PLATELETS: 546 10*3/uL — AB (ref 150–400)
RBC: 3.21 MIL/uL — ABNORMAL LOW (ref 3.87–5.11)
RDW: 15.8 % — ABNORMAL HIGH (ref 11.5–15.5)
WBC: 8.2 10*3/uL (ref 4.0–10.5)

## 2017-04-12 LAB — I-STAT BETA HCG BLOOD, ED (MC, WL, AP ONLY): I-stat hCG, quantitative: 10.6 m[IU]/mL — ABNORMAL HIGH (ref <5)

## 2017-04-12 LAB — URINALYSIS, ROUTINE W REFLEX MICROSCOPIC
BILIRUBIN URINE: NEGATIVE
GLUCOSE, UA: NEGATIVE mg/dL
HGB URINE DIPSTICK: NEGATIVE
Ketones, ur: NEGATIVE mg/dL
Leukocytes, UA: NEGATIVE
Nitrite: NEGATIVE
PH: 5 (ref 5.0–8.0)
Protein, ur: NEGATIVE mg/dL
SPECIFIC GRAVITY, URINE: 1.004 — AB (ref 1.005–1.030)

## 2017-04-12 LAB — I-STAT TROPONIN, ED: TROPONIN I, POC: 0.01 ng/mL (ref 0.00–0.08)

## 2017-04-12 LAB — I-STAT CG4 LACTIC ACID, ED: LACTIC ACID, VENOUS: 1.75 mmol/L (ref 0.5–1.9)

## 2017-04-12 LAB — PHOSPHORUS: PHOSPHORUS: 2.8 mg/dL (ref 2.5–4.6)

## 2017-04-12 LAB — MAGNESIUM: MAGNESIUM: 1.1 mg/dL — AB (ref 1.7–2.4)

## 2017-04-12 LAB — BRAIN NATRIURETIC PEPTIDE: B NATRIURETIC PEPTIDE 5: 537.1 pg/mL — AB (ref 0.0–100.0)

## 2017-04-12 LAB — MRSA PCR SCREENING: MRSA by PCR: NEGATIVE

## 2017-04-12 LAB — LIPASE, BLOOD: LIPASE: 22 U/L (ref 11–51)

## 2017-04-12 MED ORDER — ONDANSETRON HCL 4 MG/2ML IJ SOLN: 4.0000 mg | Freq: Four times a day (QID) | INTRAMUSCULAR | Status: DC | PRN

## 2017-04-12 MED ORDER — TRAMADOL HCL 50 MG PO TABS
50.0000 mg | ORAL_TABLET | Freq: Four times a day (QID) | ORAL | Status: DC | PRN
  Administered 2017-04-13 (×4): 50 mg via ORAL
  Filled 2017-04-12 (×5): qty 1

## 2017-04-12 MED ORDER — IBUPROFEN 200 MG PO TABS: 400.0000 mg | ORAL_TABLET | Freq: Four times a day (QID) | ORAL | Status: DC | PRN

## 2017-04-12 MED ORDER — MORPHINE SULFATE (PF) 2 MG/ML IV SOLN
2.0000 mg | Freq: Once | INTRAVENOUS | Status: AC
  Administered 2017-04-12: 2 mg via INTRAVENOUS
  Filled 2017-04-12: qty 1

## 2017-04-12 MED ORDER — FUROSEMIDE 10 MG/ML IJ SOLN
40.0000 mg | Freq: Two times a day (BID) | INTRAMUSCULAR | Status: DC
Start: 2017-04-12 — End: 2017-04-14
  Administered 2017-04-12 – 2017-04-14 (×4): 40 mg via INTRAVENOUS
  Filled 2017-04-12 (×4): qty 4

## 2017-04-12 MED ORDER — ASPIRIN EC 325 MG PO TBEC
325.0000 mg | DELAYED_RELEASE_TABLET | Freq: Every day | ORAL | Status: DC
  Administered 2017-04-13 – 2017-04-14 (×2): 325 mg via ORAL
  Filled 2017-04-12 (×2): qty 1

## 2017-04-12 MED ORDER — MORPHINE SULFATE (PF) 4 MG/ML IV SOLN
4.0000 mg | Freq: Once | INTRAVENOUS | Status: AC
  Administered 2017-04-12: 4 mg via INTRAVENOUS
  Filled 2017-04-12: qty 1

## 2017-04-12 MED ORDER — ADULT MULTIVITAMIN W/MINERALS CH
1.0000 | ORAL_TABLET | Freq: Every day | ORAL | Status: DC
  Administered 2017-04-13 – 2017-04-14 (×2): 1 via ORAL
  Filled 2017-04-12 (×2): qty 1

## 2017-04-12 MED ORDER — DOXYCYCLINE HYCLATE 100 MG PO TABS
100.0000 mg | ORAL_TABLET | Freq: Two times a day (BID) | ORAL | Status: DC
  Administered 2017-04-12 – 2017-04-14 (×4): 100 mg via ORAL
  Filled 2017-04-12 (×4): qty 1

## 2017-04-12 MED ORDER — DOXYCYCLINE HYCLATE 100 MG PO TABS: 100.0000 mg | ORAL_TABLET | Freq: Two times a day (BID) | ORAL | Status: DC

## 2017-04-12 MED ORDER — METOPROLOL TARTRATE 25 MG PO TABS
25.0000 mg | ORAL_TABLET | Freq: Two times a day (BID) | ORAL | Status: DC
  Administered 2017-04-12 – 2017-04-14 (×4): 25 mg via ORAL
  Filled 2017-04-12 (×4): qty 1

## 2017-04-12 MED ORDER — DEXAMETHASONE SODIUM PHOSPHATE 4 MG/ML IJ SOLN
2.0000 mg | Freq: Once | INTRAMUSCULAR | Status: AC
  Administered 2017-04-12: 2 mg via INTRAVENOUS
  Filled 2017-04-12: qty 1

## 2017-04-12 MED ORDER — ONDANSETRON HCL 4 MG/2ML IJ SOLN
4.0000 mg | Freq: Once | INTRAMUSCULAR | Status: AC
  Administered 2017-04-12: 4 mg via INTRAVENOUS
  Filled 2017-04-12: qty 2

## 2017-04-12 NOTE — Consult Note (Addendum)
CARDIOLOGY CONSULT   Patient ID: Gabrielle Page; MRN: [id number]; DOB: Mar 01, 1960   Admission date: 04/12/2017  Primary Care Provider: [name], NP Primary Cardiologist: Peter Martinique, MD  Primary Electrophysiologist:  NA  Chief Complaint:  Chest pain and SOB  Gabrielle Page is a 57 y.o. female who is being seen today for the evaluation of sob at the request of Dr.  Wendee Beavers..  Patient Profile:   Gabrielle Page is a 57 y.o. female with a history of CABG 03/26/2017 w/ LIMA-LAD, SVG-Diag, SVG-OM, GERD, chronic bronchitis, asthma was admitted 02/24 w/ L shoulder pain, abd pain, PNA, new peric effusion.   History of Present Illness:   Gabrielle Page has a history of NSTEMI - CABG 03/26/2017 w/ LIMA-LAD, SVG-Diag, SVG-OM, GERD, chronic bronchitis, asthma was admitted 02/24 w/ L shoulder pain, abd pain, PNA, new peric effusion.  The Echo with EF 65-70% and large pericardial effusion posterior to hear 19 mm in max. dimension- no tamponade- placed on colchicine/NSAIDS.  Her PNA was treated and discharged on 04/10/17.  Presents now with SOB and chest pain.  She noted she still feels volume overloaded.    EKG  I personally reviewed and SR with artifact making it difficult to see ischemic changes.low voltage.    Na 137, K+ 3.8, Cr 0.76, alb 2.9 alk phos 153 Troponin poc 0.01 Lactic acid 1.75 Hgb 9.3, Hct 30.5, plts 546 have bee 270   Currently her chest pain is returning.  She has been SOB and prior to discharge some vomiting and she felt tight as if volume overloaded and wt did increase.  She also has diarrhea. No blood noted.    Continues with anemia and on amiodarone for presumed post op a fib though I do not find strips vs. Preventive.      Past Medical History:  Diagnosis Date  . Asthma   . Chronic bronchitis ([place])    "usually get it q yr" (11/05/2016)  . GERD (gastroesophageal reflux disease)   . Headache    "weekly" (11/05/2016)  . Heart murmur    "dx'd when I was a  child"  . Hepatitis 1991   "when I had my gallbladder out" (11/05/2016)  . History of blood transfusion 1985   S/P childbirth  . History of kidney stones   . NSTEMI (non-ST elevated myocardial infarction) ([place]) 11/05/2016   9/18 PCI/DESx1 to p/mLAD  . Pancreatitis 1991  . Pneumonia    "twice" (11/05/2016)    Past Surgical History:  Procedure Laterality Date  . ABDOMINAL HYSTERECTOMY  1990  . APPENDECTOMY  1992  . BILE DUCT EXPLORATION  1991   "reconstruction"  . CESAREAN SECTION  1985  . CHOLECYSTECTOMY OPEN  1991  . CORONARY ANGIOPLASTY WITH STENT PLACEMENT  11/05/2016  . CORONARY ARTERY BYPASS GRAFT N/A 03/26/2017   Procedure: CORONARY ARTERY BYPASS GRAFTING (CABG) x 3 WITH ENDOSCOPIC HARVESTING OF RIGHT SAPHENOUS VEIN;  Surgeon: Ivin Poot, MD;  Location: [facility];  Service: Open Heart Surgery;  Laterality: N/A;  . CORONARY STENT INTERVENTION N/A 11/05/2016   Procedure: CORONARY STENT INTERVENTION;  Surgeon: Martinique, Peter M, MD;  Location: [facility];  Service: Cardiovascular;  Laterality: N/A;  . CYSTOSCOPY WITH STENT PLACEMENT  "3 times" (11/05/2016)  . DILATION AND CURETTAGE OF UTERUS  1989  . INTRAVASCULAR ULTRASOUND/IVUS N/A 03/22/2017   Procedure: Intravascular Ultrasound/IVUS;  Surgeon: Martinique, Peter M, MD;  Location: [facility];  Service: Cardiovascular;  Laterality: N/A;  . KNEE ARTHROSCOPY Right 2000s  .  LEFT HEART CATH AND CORONARY ANGIOGRAPHY N/A 11/05/2016   Procedure: LEFT HEART CATH AND CORONARY ANGIOGRAPHY;  Surgeon: Martinique, Peter M, MD;  Location: [facility];  Service: Cardiovascular;  Laterality: N/A;  . LEFT HEART CATH AND CORONARY ANGIOGRAPHY N/A 03/22/2017   Procedure: LEFT HEART CATH AND CORONARY ANGIOGRAPHY;  Surgeon: Martinique, Peter M, MD;  Location: [facility];  Service: Cardiovascular;  Laterality: N/A;  . NASAL SINUS SURGERY  ~ 2009   "exposed to mildew; had to have the infectoin drained; cut the outside of my face"  . OVARIAN  CYST SURGERY  X 6  . TEE WITHOUT CARDIOVERSION N/A 03/26/2017   Procedure: TRANSESOPHAGEAL ECHOCARDIOGRAM (TEE);  Surgeon: Prescott Gum, [name], MD;  Location: [facility];  Service: Open Heart Surgery;  Laterality: N/A;     Medications Prior to Admission: Prior to Admission medications   Medication Sig Start Date End Date Taking? Authorizing Provider  amiodarone (PACERONE) 200 MG tablet Take 1 tablet (200 mg total) by mouth 2 (two) times daily. 03/31/17   [name], PA-C  aspirin EC 325 MG EC tablet Take 1 tablet (325 mg total) by mouth daily. 04/01/17   Gold, Wilder Glade, PA-C  colchicine 0.6 MG tablet Take 1 tablet (0.6 mg total) by mouth 2 (two) times daily. 04/10/17   Geradine Girt, DO  doxycycline (VIBRA-TABS) 100 MG tablet Take 1 tablet (100 mg total) by mouth every 12 (twelve) hours. 04/10/17   Geradine Girt, DO  HYDROmorphone (DILAUDID) 2 MG tablet Take 1 tablet (2 mg total) by mouth every 6 (six) hours as needed for moderate pain or severe pain. 03/31/17   Gold, Wilder Glade, PA-C  ibuprofen (ADVIL,MOTRIN) 400 MG tablet Take 1 tablet (400 mg total) by mouth every 6 (six) hours as needed for fever, headache or mild pain. 04/10/17   Geradine Girt, DO  metoprolol tartrate (LOPRESSOR) 25 MG tablet Take 1 tablet (25 mg total) by mouth 2 (two) times daily. 03/31/17   [name], PA-C  Multiple Vitamin (MULTIVITAMIN WITH MINERALS) TABS tablet Take 1 tablet by mouth daily.    [provider]  traMADol (ULTRAM) 50 MG tablet Take 1 tablet (50 mg total) by mouth every 6 (six) hours as needed. 04/10/17 04/10/18  Geradine Girt, DO     Allergies:    Allergies  Allergen Reactions  . Compazine [Prochlorperazine Edisylate] Other (See Comments)    SEIZURE  . Other Other (See Comments)    Steroids cause uncontrollable coughing    Social History:   Social History   Socioeconomic History  . Marital status: Divorced    Spouse name: Not on file  . Number of children: Not on file  . Years of  education: Not on file  . Highest education level: Not on file  Social Needs  . Financial resource strain: Not on file  . Food insecurity - worry: Not on file  . Food insecurity - inability: Not on file  . Transportation needs - medical: Not on file  . Transportation needs - non-medical: Not on file  Occupational History  . Not on file  Tobacco Use  . Smoking status: Never Smoker  . Smokeless tobacco: Never Used  Substance and Sexual Activity  . Alcohol use: Yes    Comment: 11/05/2016 "maybe 1 glass of wine/month"  . Drug use: No  . Sexual activity: Not Currently  Other Topics Concern  . Not on file  Social History Narrative  .  Not on file    Family History:  The patient's family history includes Alcohol abuse in her father; Diabetes Mellitus II in her father; Heart attack in her father; Hypertension in her father; Suicidality in her mother.    ROS:  Please see the history of present illness.  General:no colds or fevers, some weight increase Skin:no rashes or ulcers HEENT:no blurred vision, no congestion CV:see HPI PUL:see HPI GI:+ diarrhea no constipation or melena, no indigestion GU:no hematuria, no dysuria MS:no joint pain, no claudication Neuro:no syncope, no lightheadedness Endo:no diabetes, no thyroid disease      Physical Exam/Data:   Vitals:   04/12/17 1309 04/12/17 1318 04/12/17 1545  BP: (!) 138/92  (!) 149/76  Pulse: 63  64  Resp: 18  (!) 21  Temp: 99.3 F (37.4 C)    TempSrc: Oral    SpO2: 98%  98%  Weight:  150 lb (68 kg)    No intake or output data in the 24 hours ending 04/12/17 1620 Filed Weights   04/12/17 1318  Weight: 150 lb (68 kg)   Body mass index is 26.57 kg/m.  General:  Well nourished, well developed, in no acute distress though frustrated and need for BR for diarrhea HEENT: normal Lymph: no adenopathy Neck: no JVD Endocrine:  No thryomegaly Vascular: No carotid bruits; FA pulses 2+ bilaterally without bruits  Cardiac:  normal  S1, S2; RRR; no murmur gallup rub or click, increased pain when is lying back incisions healing. Lungs:  clear to auscultation  On rt, lt with rhonchiy, no wheezing,  rales  Abd: soft, nontender, no hepatomegaly  Ext: no lower ext edema Musculoskeletal:  No deformities, BUE and BLE strength normal and equal Skin: warm and dry  Neuro:  Alert and oriented X 3 MAE follows commands  no focal abnormalities noted Psych:  Normal affect    Relevant CV Studies:  Echo 04/08/17 Study Conclusions  - Left ventricle: The cavity size was normal. Wall thickness was   normal. Systolic function was vigorous. The estimated ejection   fraction was in the range of 65% to 70%. - Left atrium: The atrium was mildly dilated. - Pericardium, extracardiac: Large pericardial effusion posterior   to heart 19 mm in maximal dimension. Does not meet echo criteria   for tamponade Clinical correlation indicated.  Cath 03/22/17 Conclusion     Previously placed Prox LAD to Mid LAD stent (unknown type) is widely patent.  Ost LAD to Prox LAD lesion is 80% stenosed.  Dist LM lesion is 40% stenosed.  Ost 1st Diag lesion is 85% stenosed.  The left ventricular systolic function is normal.  LV end diastolic pressure is normal.  The left ventricular ejection fraction is 55-65% by visual estimate.   1. Single vessel obstructive CAD. There is progression of disease in the ostium of the LAD. This area was difficult to see angiographically but clearly worse than prior angiogram in September.  IVUS confirms significant stenosis in this area and this correlates with her clinical presentation and Nuclear stress test findings. 2. Normal LV function 3. Normal LVEDP  Plan: Hold Brilinta. Patient will need CABG after Brilinta washout. Will resume IV heparin. If she has recurrent chest pain would start IV Ntg.      Laboratory Data:  Chemistry Recent Labs  Lab 04/09/17 0827 04/12/17 1320  NA 132* 137  K 3.7 3.8    CL 102 107  CO2 18* 16*  GLUCOSE 130* 86  BUN 17 15  CREATININE  0.78 0.76  CALCIUM 8.3* 8.7*  GFRNONAA >60 >60  GFRAA >60 >60  ANIONGAP 12 14    Recent Labs  Lab 04/12/17 1320  PROT 6.3*  ALBUMIN 2.9*  AST 40  ALT 93*  ALKPHOS 153*  BILITOT 0.6   Hematology Recent Labs  Lab 04/09/17 0827 04/12/17 1320  WBC 9.2 8.2  RBC 2.85* 3.21*  HGB 8.7* 9.3*  HCT 26.6* 30.5*  MCV 93.3 95.0  MCH 30.5 29.0  MCHC 32.7 30.5  RDW 16.0* 15.8*  PLT 664* 546*   Cardiac Enzymes Recent Labs  Lab 04/08/17 0440 04/08/17 0929 04/08/17 1713  TROPONINI 0.07* 0.06* 0.05*    Recent Labs  Lab 04/07/17 2316 04/12/17 1411  TROPIPOC 0.03 0.01    BNPNo results for input(s): BNP, PROBNP in the last 168 hours.  DDimer No results for input(s): DDIMER in the last 168 hours.  Radiology/Studies:  Dg Chest 2 View  Result Date: 04/12/2017 CLINICAL DATA:  Triple bypass/SOB/dry cough x 3 weeks ago, nausea/diarrhea, nonsmoker. EXAM: CHEST  2 VIEW COMPARISON:  Chest CT, 04/08/2017.  Chest radiograph, 04/07/2017. FINDINGS: Changes from CABG surgery are stable. The cardiac silhouette is mildly enlarged. There is a left coronary artery stent. No mediastinal or hilar masses. No evidence of adenopathy. There is dependent lung opacity, right greater than left consistent with pneumonia and/or atelectasis. Small left pleural effusion. Remainder of the lungs is clear. No pneumothorax. Skeletal structures are intact. IMPRESSION: 1. No significant change from the prior CT or prior chest radiographs. Persistent lung base opacity, greater on the right, consistent with either atelectasis, pneumonia or a combination. Small left pleural effusion. Electronically Signed   By: Lajean Manes M.D.   On: 04/12/2017 13:40    Assessment and Plan:   SOB feels like volume overloaded per pt- CXR without change from prior CT or CXRs, Persistent lung base opacity, greater on the right, consistent with either atelectasis, pneumonia  or a combination.  Small left pleural effusion.  BNP pending   -wt up 10 lbs with hospitalization  BNP is elevated will begin lasix 40 mg BID IV.   Diarrhea and vomiting ? Diarrhea from colchicine, or doxycycline.  She was vomiting on last admit  Stop amiodarone or at least decrease she does have hs of elevated LFTs and COPD  Continue colchicine for now.  Chest pain worse with lying back.  Agree to recheck Echo it has been ordered.  Combination of pericarditis and pericardial effusion. Troponin neg. On colchicine and NSAIDS  CAD with NSTEMI and CABG X 3 03/26/17  LIMA to LAD, VG to diag, VG to LCX  incisions healed.  ? Afib post op and continues on amiodarone vs preventive, started on 03/29/17 I do not see strips of a fib or EKGs of A fib.   -with N&V stop amiodarone  Recent lobar PNA on doxycycline   GERD ? protonix   HLD currently not on statin. Hx elevated LFTs. Today stable    For questions or updates, please contact [place] Please consult [email or web address] for contact info under Cardiology/STEMI.    Signed, Cecilie Kicks, NP  04/12/2017 4:20 PM

## 2017-04-12 NOTE — ED Notes (Signed)
Attempted report x1. 

## 2017-04-12 NOTE — ED Provider Notes (Signed)
[facility] EMERGENCY DEPARTMENT Provider Note   CSN: [id number] Arrival date & time: 04/12/17  1254     History   Chief Complaint No chief complaint on file.   HPI Gabrielle Page is a 57 y.o. female.  She is status post a triple bypass in late January that has been complicated by pericardial effusion.  She states this week she was readmitted for increased shortness of breath and was discharged on colchicine tramadol and ibuprofen.  She was taken off her Dilaudid for pain.  Since then she has had fluid gain and shortness of breath and now nausea vomiting and diarrhea.  She states she is unable to hold down her medications.  The vomiting is been not been bloody and no blood in her stools.  She did states she had a nosebleed today.  She called CT surgery today and they told her to return to the emergency department.  There is not been a fever.  The history is provided by the patient.  Shortness of Breath  This is a recurrent problem. The current episode started more than 2 days ago. The problem has been gradually worsening. Associated symptoms include vomiting. Pertinent negatives include no fever, no sore throat, no ear pain, no cough, no chest pain, no abdominal pain and no rash. It is unknown what precipitated the problem. She has had prior hospitalizations. Associated medical issues include CAD and recent surgery.    Past Medical History:  Diagnosis Date  . Asthma   . Chronic bronchitis ([place])    "usually get it q yr" (11/05/2016)  . GERD (gastroesophageal reflux disease)   . Headache    "weekly" (11/05/2016)  . Heart murmur    "dx'd when I was a child"  . Hepatitis 1991   "when I had my gallbladder out" (11/05/2016)  . History of blood transfusion 1985   S/P childbirth  . History of kidney stones   . NSTEMI (non-ST elevated myocardial infarction) ([place]) 11/05/2016   9/18 PCI/DESx1 to p/mLAD  . Pancreatitis 1991  . Pneumonia    "twice" (11/05/2016)     Patient Active Problem List   Diagnosis Date Noted  . Chest pain 04/08/2017  . CAD (coronary artery disease) 04/08/2017  . Pericardial effusion 04/08/2017  . Lobar pneumonia ([place]) 04/08/2017  . GERD (gastroesophageal reflux disease) 04/08/2017  . Abdominal pain 04/08/2017  . 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 Elevated LFTs 03/23/2017  . Unstable angina ([place]) 03/21/2017  . Non-ST elevation (NSTEMI) myocardial infarction ([place]) 11/05/2016  . LBBB (left bundle branch block) 11/05/2016    Past Surgical History:  Procedure Laterality Date  . ABDOMINAL HYSTERECTOMY  1990  . APPENDECTOMY  1992  . BILE DUCT EXPLORATION  1991   "reconstruction"  . CESAREAN SECTION  1985  . CHOLECYSTECTOMY OPEN  1991  . CORONARY ANGIOPLASTY WITH STENT PLACEMENT  11/05/2016  . CORONARY ARTERY BYPASS GRAFT N/A 03/26/2017   Procedure: CORONARY ARTERY BYPASS GRAFTING (CABG) x 3 WITH ENDOSCOPIC HARVESTING OF RIGHT SAPHENOUS VEIN;  Surgeon: Ivin Poot, MD;  Location: [facility];  Service: Open Heart Surgery;  Laterality: N/A;  . CORONARY STENT INTERVENTION N/A 11/05/2016   Procedure: CORONARY STENT INTERVENTION;  Surgeon: Martinique, Peter M, MD;  Location: [facility];  Service: Cardiovascular;  Laterality: N/A;  . CYSTOSCOPY WITH STENT PLACEMENT  "3 times" (11/05/2016)  . DILATION AND CURETTAGE OF UTERUS  1989  . INTRAVASCULAR ULTRASOUND/IVUS N/A 03/22/2017   Procedure: Intravascular Ultrasound/IVUS;  Surgeon: Martinique,  Ander Slade, MD;  Location: [facility];  Service: Cardiovascular;  Laterality: N/A;  . KNEE ARTHROSCOPY Right 2000s  . LEFT HEART CATH AND CORONARY ANGIOGRAPHY N/A 11/05/2016   Procedure: LEFT HEART CATH AND CORONARY ANGIOGRAPHY;  Surgeon: Martinique, Peter M, MD;  Location: [facility];  Service: Cardiovascular;  Laterality: N/A;  . LEFT HEART CATH AND CORONARY ANGIOGRAPHY N/A 03/22/2017   Procedure: LEFT HEART CATH AND CORONARY ANGIOGRAPHY;  Surgeon: Martinique, Peter M, MD;  Location: [facility];  Service: Cardiovascular;  Laterality: N/A;  . NASAL SINUS SURGERY  ~ 2009   "exposed to mildew; had to have the infectoin drained; cut the outside of my face"  . OVARIAN CYST SURGERY  X 6  . TEE WITHOUT CARDIOVERSION N/A 03/26/2017   Procedure: TRANSESOPHAGEAL ECHOCARDIOGRAM (TEE);  Surgeon: Prescott Gum, [name], MD;  Location: [facility];  Service: Open Heart Surgery;  Laterality: N/A;    OB History    No data available       Home Medications    Prior to Admission medications   Medication Sig Start Date End Date Taking? Authorizing Provider  amiodarone (PACERONE) 200 MG tablet Take 1 tablet (200 mg total) by mouth 2 (two) times daily. 03/31/17   [name], PA-C  aspirin EC 325 MG EC tablet Take 1 tablet (325 mg total) by mouth daily. 04/01/17   Gold, Wilder Glade, PA-C  colchicine 0.6 MG tablet Take 1 tablet (0.6 mg total) by mouth 2 (two) times daily. 04/10/17   Geradine Girt, DO  doxycycline (VIBRA-TABS) 100 MG tablet Take 1 tablet (100 mg total) by mouth every 12 (twelve) hours. 04/10/17   Geradine Girt, DO  HYDROmorphone (DILAUDID) 2 MG tablet Take 1 tablet (2 mg total) by mouth every 6 (six) hours as needed for moderate pain or severe pain. 03/31/17   Gold, Wilder Glade, PA-C  ibuprofen (ADVIL,MOTRIN) 400 MG tablet Take 1 tablet (400 mg total) by mouth every 6 (six) hours as needed for fever, headache or mild pain. 04/10/17   Geradine Girt, DO  metoprolol tartrate (LOPRESSOR) 25 MG tablet Take 1 tablet (25 mg total) by mouth 2 (two) times daily. 03/31/17   [name], PA-C  Multiple Vitamin (MULTIVITAMIN WITH MINERALS) TABS tablet Take 1 tablet by mouth daily.    [provider]  traMADol (ULTRAM) 50 MG tablet Take 1 tablet (50 mg total) by mouth every 6 (six) hours as needed. 04/10/17 04/10/18  Geradine Girt, DO    Family History Family History  Problem Relation Age of Onset  . Hypertension Father   . Alcohol abuse Father   . Heart attack Father   . Diabetes Mellitus II  Father   . Suicidality Mother     Social History Social History   Tobacco Use  . Smoking status: Never Smoker  . Smokeless tobacco: Never Used  Substance Use Topics  . Alcohol use: Yes    Comment: 11/05/2016 "maybe 1 glass of wine/month"  . Drug use: No     Allergies   Compazine [prochlorperazine edisylate] and Other   Review of Systems Review of Systems  Constitutional: Negative for chills and fever.  HENT: Positive for nosebleeds. Negative for ear pain and sore throat.   Eyes: Negative for pain and visual disturbance.  Respiratory: Positive for shortness of breath. Negative for cough.   Cardiovascular: Negative for chest pain and palpitations.  Gastrointestinal: Positive for diarrhea, nausea and vomiting. Negative for  abdominal pain.  Genitourinary: Negative for dysuria and hematuria.  Musculoskeletal: Negative for arthralgias and back pain.  Skin: Negative for color change and rash.  Neurological: Negative for seizures and syncope.  All other systems reviewed and are negative.    Physical Exam Updated Vital Signs BP (!) 138/92 (BP Location: Left Arm)   Pulse 63   Temp 99.3 F (37.4 C) (Oral)   Resp 18   Wt 68 kg (150 lb)   SpO2 98%   BMI 26.57 kg/m   Physical Exam  Constitutional: She appears well-developed and well-nourished. No distress.  HENT:  Head: Normocephalic and atraumatic.  Mouth/Throat: Oropharynx is clear and moist.  Eyes: Conjunctivae and EOM are normal. Pupils are equal, round, and reactive to light.  Neck: Neck supple. No tracheal deviation present.  Cardiovascular: Normal rate, regular rhythm and intact distal pulses.  No murmur heard. Pulmonary/Chest: Effort normal and breath sounds normal. No respiratory distress.  Abdominal: Soft. She exhibits no mass. There is no tenderness. There is no rebound.  Musculoskeletal: She exhibits no edema or deformity.  Neurological: She is alert.  Skin: Skin is warm and dry.  Psychiatric: She has a  normal mood and affect.  Nursing note and vitals reviewed.    ED Treatments / Results  Labs (all labs ordered are listed, but only abnormal results are displayed) Labs Reviewed  COMPREHENSIVE METABOLIC PANEL - Abnormal; Notable for the following components:      Result Value   CO2 16 (*)    Calcium 8.7 (*)    Total Protein 6.3 (*)    Albumin 2.9 (*)    ALT 93 (*)    Alkaline Phosphatase 153 (*)    All other components within normal limits  CBC WITH DIFFERENTIAL/PLATELET - Abnormal; Notable for the following components:   RBC 3.21 (*)    Hemoglobin 9.3 (*)    HCT 30.5 (*)    RDW 15.8 (*)    Platelets 546 (*)    All other components within normal limits  I-STAT BETA HCG BLOOD, ED ([facility], [facility], [facility] ONLY) - Abnormal; Notable for the following components:   I-stat hCG, quantitative 10.6 (*)    All other components within normal limits  URINALYSIS, ROUTINE W REFLEX MICROSCOPIC  BRAIN NATRIURETIC PEPTIDE  I-STAT TROPONIN, ED  I-STAT CG4 LACTIC ACID, ED  I-STAT CG4 LACTIC ACID, ED    EKG  EKG Interpretation  Date/Time:  Friday April 12 2017 13:13:58 EST Ventricular Rate:  64 PR Interval:  142 QRS Duration: 92 QT Interval:  408 QTC Calculation: 420 R Axis:   65 Text Interpretation:  Normal sinus rhythm Low voltage QRS ST & T wave abnormality, consider inferior ischemia Abnormal ECG poor baseline Confirmed by Aletta Edouard [phone]) on 04/12/2017 3:25:02 PM       Radiology Dg Chest 2 View  Result Date: 04/12/2017 CLINICAL DATA:  Triple bypass/SOB/dry cough x 3 weeks ago, nausea/diarrhea, nonsmoker. EXAM: CHEST  2 VIEW COMPARISON:  Chest CT, 04/08/2017.  Chest radiograph, 04/07/2017. FINDINGS: Changes from CABG surgery are stable. The cardiac silhouette is mildly enlarged. There is a left coronary artery stent. No mediastinal or hilar masses. No evidence of adenopathy. There is dependent lung opacity, right greater than left consistent with pneumonia and/or atelectasis. Small left  pleural effusion. Remainder of the lungs is clear. No pneumothorax. Skeletal structures are intact. IMPRESSION: 1. No significant change from the prior CT or prior chest radiographs. Persistent lung base opacity, greater on the right, consistent with either  atelectasis, pneumonia or a combination. Small left pleural effusion. Electronically Signed   By: Lajean Manes M.D.   On: 04/12/2017 13:40    Procedures Procedures (including critical care time)  Medications Ordered in ED Medications - No data to display   Initial Impression / Assessment and Plan / ED Course  I have reviewed the triage vital signs and the nursing notes.  Pertinent labs & imaging results that were available during my care of the patient were reviewed by me and considered in my medical decision making (see chart for details).  Clinical Course as of Apr 13 1036  Fri Apr 12, 2017  1545 Discussed with 1 of the nurses for Dr. Darcey Nora service.  He is currently not off call and is not available but she will review this with 1 of his partners and get back to me with some plan.  [MB]  Bairoa La Veinticinco from [facility] cardiology.  She discussed with Dr. Servando Snare and the recommendation is that we get a cardiac echo.  They felt she would likely need to be admitted to cardiology or hospitalist and they will consult  [MB]  1559 Discussed with cards Master Trish who will have a consult team to see the patient.  Her recommendation is that the patient would need to go on with the hospitalist service.  [MB]  1030 Cussed with hospitalist service who will admit to their service.  [MB]    Clinical Course User Index [MB] Hayden Rasmussen, MD     Final Clinical Impressions(s) / ED Diagnoses   Final diagnoses:  Shortness of breath  Nausea vomiting and diarrhea    ED Discharge Orders    None       Hayden Rasmussen, MD 04/13/17 1042

## 2017-04-12 NOTE — ED Notes (Signed)
Lab to add on BNP.  °

## 2017-04-12 NOTE — Telephone Encounter (Signed)
New Message     Pt c/o swelling: STAT is pt has developed SOB within 24 hours  How much weight have you gained and in what time span? 10lbs -1 day 1) If swelling, where is the swelling located?  All over   2) Are you currently taking a fluid pill? no  3) Are you currently SOB? no  4) Do you have a log of your daily weights (if so, list)? no  5) Have you gained 3 pounds in a day or 5 pounds in a week? 10lbs  6) Have you traveled recently?  No    Was just released from hospital Wednesday and was given an antibiotic, she gained the 10lbs why she was in hospital and can not keep anything down .

## 2017-04-12 NOTE — ED Triage Notes (Addendum)
Pt presents to the ed with complaints of having shortness of breath and chest pain. Reports that she just had a bypass done last week for stent placement. States she was also diagnosed with overload, feels like she is still over loaded. Complaints also of nausea. NAD in triage.

## 2017-04-12 NOTE — Telephone Encounter (Signed)
Returned call to pt she states that she just got d/c from the hospital 04-10-17(04/07/2017 - 04/10/2017 (3 days))Wednesday for weight gain and LE swelling. She states that her weight has not came down since she was in the hospital or after returning home from the hospital her weight at home before going to the hospital was 140 and after returning home was 150 she states that today it is still 150. She states that all they gave her in the hospital/ER was 1 fluid pill and this did not get any weight off. She states that she is still swollen, nauseous and has no appetite. She is SOB sometimes. But denies any other sx.  Informed pt to go to the Hospital again ASAP. She states that she will get a ride because she cannot drive.

## 2017-04-12 NOTE — H&P (Signed)
History and Physical    Gabrielle Page [id number]:[id number] DOB: October 28, 1960 DOA: 04/12/2017  PCP: [name], NP  Patient coming from: home  I have personally briefly reviewed patient's old medical records in [place]  Chief Complaint: nausea, emesis, sob, weight gain, LE swelling  HPI: Gabrielle Page is a 57 y.o. female with medical history significant of 57 y.o. female with medical history significant of COPD, non-STEMI CABG on 03/26/17, stent placements, pancreatitis, hepatitis, left bundle blockage, asthma, GERD, who presents with complaints listed above. Reportedly patient has been in and out of the hospital since recent CABG 3 vessel performed recently. Presented last admission with pericardial effusion and was discharged home with colchicine and nsaids. After taking colchicine she reports developing nausea and emesis. She also feels more edematous.  ED Course: Pt was found to have normal troponin and lactic acid. Elevated BNP. Cardiology and CT surgery consulted for further evaluation and recommendations by ED MD.   Review of Systems: As per HPI otherwise 10 point review of systems negative.    Past Medical History:  Diagnosis Date  . Asthma   . Chronic bronchitis ([place])    "usually get it q yr" (11/05/2016)  . GERD (gastroesophageal reflux disease)   . Headache    "weekly" (11/05/2016)  . Heart murmur    "dx'd when I was a child"  . Hepatitis 1991   "when I had my gallbladder out" (11/05/2016)  . History of blood transfusion 1985   S/P childbirth  . History of kidney stones   . NSTEMI (non-ST elevated myocardial infarction) ([place]) 11/05/2016   9/18 PCI/DESx1 to p/mLAD  . Pancreatitis 1991  . Pneumonia    "twice" (11/05/2016)    Past Surgical History:  Procedure Laterality Date  . ABDOMINAL HYSTERECTOMY  1990  . APPENDECTOMY  1992  . BILE DUCT EXPLORATION  1991   "reconstruction"  . CESAREAN SECTION  1985  . CHOLECYSTECTOMY OPEN  1991  . CORONARY  ANGIOPLASTY WITH STENT PLACEMENT  11/05/2016  . CORONARY ARTERY BYPASS GRAFT N/A 03/26/2017   Procedure: CORONARY ARTERY BYPASS GRAFTING (CABG) x 3 WITH ENDOSCOPIC HARVESTING OF RIGHT SAPHENOUS VEIN;  Surgeon: Ivin Poot, MD;  Location: [facility];  Service: Open Heart Surgery;  Laterality: N/A;  . CORONARY STENT INTERVENTION N/A 11/05/2016   Procedure: CORONARY STENT INTERVENTION;  Surgeon: Martinique, Peter M, MD;  Location: [facility];  Service: Cardiovascular;  Laterality: N/A;  . CYSTOSCOPY WITH STENT PLACEMENT  "3 times" (11/05/2016)  . DILATION AND CURETTAGE OF UTERUS  1989  . INTRAVASCULAR ULTRASOUND/IVUS N/A 03/22/2017   Procedure: Intravascular Ultrasound/IVUS;  Surgeon: Martinique, Peter M, MD;  Location: [facility];  Service: Cardiovascular;  Laterality: N/A;  . KNEE ARTHROSCOPY Right 2000s  . LEFT HEART CATH AND CORONARY ANGIOGRAPHY N/A 11/05/2016   Procedure: LEFT HEART CATH AND CORONARY ANGIOGRAPHY;  Surgeon: Martinique, Peter M, MD;  Location: [facility];  Service: Cardiovascular;  Laterality: N/A;  . LEFT HEART CATH AND CORONARY ANGIOGRAPHY N/A 03/22/2017   Procedure: LEFT HEART CATH AND CORONARY ANGIOGRAPHY;  Surgeon: Martinique, Peter M, MD;  Location: [facility];  Service: Cardiovascular;  Laterality: N/A;  . NASAL SINUS SURGERY  ~ 2009   "exposed to mildew; had to have the infectoin drained; cut the outside of my face"  . OVARIAN CYST SURGERY  X 6  . TEE WITHOUT CARDIOVERSION N/A 03/26/2017   Procedure: TRANSESOPHAGEAL ECHOCARDIOGRAM (TEE);  Surgeon: Prescott Gum, [name], MD;  Location: [facility]  OR;  Service: Open Heart Surgery;  Laterality: N/A;     reports that  has never smoked. she has never used smokeless tobacco. She reports that she drinks alcohol. She reports that she does not use drugs.  Allergies  Allergen Reactions  . Compazine [Prochlorperazine Edisylate] Other (See Comments)    SEIZURE  . Other Other (See Comments)    Steroids cause uncontrollable coughing      Family History  Problem Relation Age of Onset  . Hypertension Father   . Alcohol abuse Father   . Heart attack Father   . Diabetes Mellitus II Father   . Suicidality Mother     Prior to Admission medications   Medication Sig Start Date End Date Taking? Authorizing Provider  amiodarone (PACERONE) 200 MG tablet Take 1 tablet (200 mg total) by mouth 2 (two) times daily. 03/31/17  Yes Gold, Wilder Glade, PA-C  aspirin EC 325 MG EC tablet Take 1 tablet (325 mg total) by mouth daily. 04/01/17  Yes Gold, Wayne E, PA-C  colchicine 0.6 MG tablet Take 1 tablet (0.6 mg total) by mouth 2 (two) times daily. 04/10/17  Yes Geradine Girt, DO  doxycycline (VIBRA-TABS) 100 MG tablet Take 1 tablet (100 mg total) by mouth every 12 (twelve) hours. 04/10/17  Yes Vann, Jessica U, DO  ibuprofen (ADVIL,MOTRIN) 400 MG tablet Take 1 tablet (400 mg total) by mouth every 6 (six) hours as needed for fever, headache or mild pain. 04/10/17  Yes Eulogio Bear U, DO  metoprolol tartrate (LOPRESSOR) 25 MG tablet Take 1 tablet (25 mg total) by mouth 2 (two) times daily. 03/31/17  Yes Gold, Wilder Glade, PA-C  Multiple Vitamin (MULTIVITAMIN WITH MINERALS) TABS tablet Take 1 tablet by mouth daily.   Yes [provider]  traMADol (ULTRAM) 50 MG tablet Take 1 tablet (50 mg total) by mouth every 6 (six) hours as needed. Patient taking differently: Take 50 mg by mouth every 6 (six) hours as needed (pain).  04/10/17 04/10/18 Yes Vann, Jessica U, DO  HYDROmorphone (DILAUDID) 2 MG tablet Take 1 tablet (2 mg total) by mouth every 6 (six) hours as needed for moderate pain or severe pain. Patient not taking: Reported on 04/12/2017 03/31/17   [name], [place]    Physical Exam: Vitals:   04/12/17 1318 04/12/17 1545 04/12/17 1630 04/12/17 1715  BP:  (!) 149/76 135/78 (!) 148/70  Pulse:  64 64 66  Resp:  (!) 21 18 15   Temp:      TempSrc:      SpO2:  98% 98% 97%  Weight: 68 kg (150 lb)       Constitutional: NAD, calm,  comfortable Vitals:   04/12/17 1318 04/12/17 1545 04/12/17 1630 04/12/17 1715  BP:  (!) 149/76 135/78 (!) 148/70  Pulse:  64 64 66  Resp:  (!) 21 18 15   Temp:      TempSrc:      SpO2:  98% 98% 97%  Weight: 68 kg (150 lb)      Eyes: PERRL, lids and conjunctivae normal ENMT: Mucous membranes are moist. Posterior pharynx clear of any exudate or lesions.Normal dentition.  Neck: normal, supple, no masses, no thyromegaly Respiratory: clear to auscultation bilaterally, no wheezing, no crackles. Normal respiratory effort. No accessory muscle use.  Cardiovascular: Regular rate and rhythm, no murmurs / rubs / gallops. + extremity edema. 2+ pedal pulses. No carotid bruits.  Abdomen: no tenderness, no masses palpated. No hepatosplenomegaly. Bowel sounds positive.  Musculoskeletal: no  clubbing / cyanosis. No joint deformity upper and lower extremities.  Skin: no rashes, lesions, ulcers. No induration, on limited exam. Neurologic: answers questions appropriately moves extremities equally. Psychiatric: Normal judgment and insight. Alert and oriented x 3. Normal mood.     Labs on Admission: I have personally reviewed following labs and imaging studies  CBC: Recent Labs  Lab 04/07/17 2304 04/09/17 0827 04/12/17 1320  WBC 15.9* 9.2 8.2  NEUTROABS  --   --  6.6  HGB 9.9* 8.7* 9.3*  HCT 30.9* 26.6* 30.5*  MCV 92.2 93.3 95.0  PLT 766* 664* 161*   Basic Metabolic Panel: Recent Labs  Lab 04/07/17 2304 04/09/17 0827 04/12/17 1320  NA 135 132* 137  K 3.8 3.7 3.8  CL 100* 102 107  CO2 21* 18* 16*  GLUCOSE 123* 130* 86  BUN 19 17 15   CREATININE 0.86 0.78 0.76  CALCIUM 9.0 8.3* 8.7*   GFR: Estimated Creatinine Clearance: 74.4 mL/min (by C-G formula based on SCr of 0.76 mg/dL). Liver Function Tests: Recent Labs  Lab 04/12/17 1320  AST 40  ALT 93*  ALKPHOS 153*  BILITOT 0.6  PROT 6.3*  ALBUMIN 2.9*   Recent Labs  Lab 04/08/17 0440  LIPASE 20   No results for input(s):  AMMONIA in the last 168 hours. Coagulation Profile: Recent Labs  Lab 04/08/17 0440  INR 1.16   Cardiac Enzymes: Recent Labs  Lab 04/08/17 0440 04/08/17 0929 04/08/17 1713  TROPONINI 0.07* 0.06* 0.05*   BNP (last 3 results) No results for input(s): PROBNP in the last 8760 hours. HbA1C: No results for input(s): HGBA1C in the last 72 hours. CBG: Recent Labs  Lab 04/08/17 2021 04/09/17 0029  GLUCAP 62* 116*   Lipid Profile: No results for input(s): CHOL, HDL, LDLCALC, TRIG, CHOLHDL, LDLDIRECT in the last 72 hours. Thyroid Function Tests: No results for input(s): TSH, T4TOTAL, FREET4, T3FREE, THYROIDAB in the last 72 hours. Anemia Panel: No results for input(s): VITAMINB12, FOLATE, FERRITIN, TIBC, IRON, RETICCTPCT in the last 72 hours. Urine analysis:    Component Value Date/Time   COLORURINE YELLOW 04/08/2017 1311   APPEARANCEUR HAZY (A) 04/08/2017 1311   LABSPEC >1.046 (H) 04/08/2017 1311   PHURINE 6.0 04/08/2017 1311   GLUCOSEU NEGATIVE 04/08/2017 1311   HGBUR NEGATIVE 04/08/2017 1311   BILIRUBINUR NEGATIVE 04/08/2017 1311   KETONESUR NEGATIVE 04/08/2017 1311   PROTEINUR NEGATIVE 04/08/2017 1311   NITRITE NEGATIVE 04/08/2017 1311   LEUKOCYTESUR NEGATIVE 04/08/2017 1311    Radiological Exams on Admission: Dg Chest 2 View  Result Date: 04/12/2017 CLINICAL DATA:  Triple bypass/SOB/dry cough x 3 weeks ago, nausea/diarrhea, nonsmoker. EXAM: CHEST  2 VIEW COMPARISON:  Chest CT, 04/08/2017.  Chest radiograph, 04/07/2017. FINDINGS: Changes from CABG surgery are stable. The cardiac silhouette is mildly enlarged. There is a left coronary artery stent. No mediastinal or hilar masses. No evidence of adenopathy. There is dependent lung opacity, right greater than left consistent with pneumonia and/or atelectasis. Small left pleural effusion. Remainder of the lungs is clear. No pneumothorax. Skeletal structures are intact. IMPRESSION: 1. No significant change from the prior CT or  prior chest radiographs. Persistent lung base opacity, greater on the right, consistent with either atelectasis, pneumonia or a combination. Small left pleural effusion. Electronically Signed   By: Lajean Manes M.D.   On: 04/12/2017 13:40    EKG: Independently reviewed. Sinus with no ST elevations or depressions  Assessment/Plan Active Problems:   Pericardial effusion - d/c cardiology who is also on  board. Plan is to hold colchicine as this may have caused nausea and emesis as patient developed those symptoms after taking this medication. - Pt is getting echocardiogram - further recommendations per Cardiology and CT surgery - admit to stepdown unit    S/P CABG x 3/  CAD (coronary artery disease) - CT surgeon on board.  - troponin negative - pt on aspirin    Lobar pneumonia (HCC)   GERD (gastroesophageal reflux disease)  PNA - diagnosed on last admission with pna, plan is to continue doxycycline     Nausea and vomiting - most likely due to colchicine although worsening in her pericardial effusion in the differential. Will obtain lipase (d/c ED physician) - agree with holding colchicine - zofran prn nausea   DVT prophylaxis: scd's Code Status: full Family Communication: d/c patient at bedside Disposition Plan: stepdown Consults called: Cardiology and CT surgeon consulted by ED MD Admission status: inpatient   Velvet Bathe MD [facility] Pager [phone]  If 7PM-7AM, please contact night-coverage [email or web address] Password [facility]  04/12/2017, 5:51 PM

## 2017-04-13 ENCOUNTER — Observation Stay (HOSPITAL_BASED_OUTPATIENT_CLINIC_OR_DEPARTMENT_OTHER): Payer: Self-pay

## 2017-04-13 DIAGNOSIS — I340 Nonrheumatic mitral (valve) insufficiency: Secondary | ICD-10-CM

## 2017-04-13 DIAGNOSIS — R112 Nausea with vomiting, unspecified: Secondary | ICD-10-CM

## 2017-04-13 DIAGNOSIS — R197 Diarrhea, unspecified: Secondary | ICD-10-CM

## 2017-04-13 DIAGNOSIS — I4891 Unspecified atrial fibrillation: Secondary | ICD-10-CM

## 2017-04-13 DIAGNOSIS — R079 Chest pain, unspecified: Secondary | ICD-10-CM

## 2017-04-13 LAB — CBC
HEMATOCRIT: 29.8 % — AB (ref 36.0–46.0)
Hemoglobin: 9.4 g/dL — ABNORMAL LOW (ref 12.0–15.0)
MCH: 29.1 pg (ref 26.0–34.0)
MCHC: 31.5 g/dL (ref 30.0–36.0)
MCV: 92.3 fL (ref 78.0–100.0)
Platelets: 661 10*3/uL — ABNORMAL HIGH (ref 150–400)
RBC: 3.23 MIL/uL — ABNORMAL LOW (ref 3.87–5.11)
RDW: 15.6 % — ABNORMAL HIGH (ref 11.5–15.5)
WBC: 10.7 10*3/uL — AB (ref 4.0–10.5)

## 2017-04-13 LAB — CULTURE, BLOOD (ROUTINE X 2)
Culture: NO GROWTH
Culture: NO GROWTH
Special Requests: ADEQUATE
Special Requests: ADEQUATE

## 2017-04-13 LAB — COMPREHENSIVE METABOLIC PANEL
ALBUMIN: 2.8 g/dL — AB (ref 3.5–5.0)
ALT: 86 U/L — ABNORMAL HIGH (ref 14–54)
ANION GAP: 14 (ref 5–15)
AST: 36 U/L (ref 15–41)
Alkaline Phosphatase: 162 U/L — ABNORMAL HIGH (ref 38–126)
BUN: 13 mg/dL (ref 6–20)
CHLORIDE: 99 mmol/L — AB (ref 101–111)
CO2: 21 mmol/L — AB (ref 22–32)
Calcium: 8.8 mg/dL — ABNORMAL LOW (ref 8.9–10.3)
Creatinine, Ser: 0.82 mg/dL (ref 0.44–1.00)
GFR calc Af Amer: >60 mL/min (ref >60)
GFR calc non Af Amer: >60 mL/min (ref >60)
GLUCOSE: 82 mg/dL (ref 65–99)
Potassium: 3.3 mmol/L — ABNORMAL LOW (ref 3.5–5.1)
SODIUM: 134 mmol/L — AB (ref 135–145)
Total Bilirubin: 1 mg/dL (ref 0.3–1.2)
Total Protein: 6.2 g/dL — ABNORMAL LOW (ref 6.5–8.1)

## 2017-04-13 LAB — ECHOCARDIOGRAM LIMITED
HEIGHTINCHES: 63 in
Weight: 2377.4406 oz

## 2017-04-13 MED ORDER — POTASSIUM CHLORIDE CRYS ER 20 MEQ PO TBCR
40.0000 meq | EXTENDED_RELEASE_TABLET | Freq: Once | ORAL | Status: AC
  Administered 2017-04-13: 40 meq via ORAL
  Filled 2017-04-13: qty 2

## 2017-04-13 MED ORDER — MAGNESIUM SULFATE 2 GM/50ML IV SOLN
2.0000 g | Freq: Once | INTRAVENOUS | Status: AC
  Administered 2017-04-13: 2 g via INTRAVENOUS
  Filled 2017-04-13: qty 50

## 2017-04-13 MED ORDER — ZOLPIDEM TARTRATE 5 MG PO TABS
5.0000 mg | ORAL_TABLET | Freq: Once | ORAL | Status: AC
  Administered 2017-04-13: 5 mg via ORAL
  Filled 2017-04-13: qty 1

## 2017-04-13 NOTE — Progress Notes (Signed)
*  PRELIMINARY RESULTS* Echocardiogram 2D Echocardiogram limited has been performed.  Leavy Cella 04/13/2017, 1:04 PM

## 2017-04-13 NOTE — Progress Notes (Signed)
PROGRESS NOTE    Gabrielle Page  CZY:[id number] DOB: 03-29-1960 DOA: 04/12/2017 PCP: [name], NP    Brief Narrative:  57 y.o. female with medical history significant of 57 y.o.femalewith medical history significant ofCOPD, non-STEMI CABG on 03/26/17, stent placements, pancreatitis, hepatitis, left bundle blockage, asthma, GERD, who presents with complaints of nausea, diaphoresis, shortness of breath, weight gain, lower extremity swelling.  Assessment & Plan:   Active Problems: SOB - Most likely from patient being fluid overloaded and improved after IV Lasix administration. Patient does not report any shortness of breath currently. Patient had elevated BNP with normal recent EF I suspect patient may have had acute diastolic CHF exacerbation.    Pericardial effusion - Patient does not meet echo criteria for tamponade per echocardiogram done at admission - Again CT surgery and cardiology on board.  - We'll defer treatment plan to cardiologist who have been consulted and are on board    S/P CABG x 3/ CAD (coronary artery disease) - CT surgery consulted continue current regimen    Lobar pneumonia ([place]) - Patient was discharged on oral antibiotic doxycycline plan will be to continue on discharge    GERD (gastroesophageal reflux disease) - Stable currently    Nausea and vomiting - Resolved after discontinuation of colchicine. Further treatment options for pericardial effusion follow cardiology - continue supportive therapy with anti emetics   DVT prophylaxis: scds' Code Status: Full Family Communication: No one at bedside. Disposition Plan: pending recommendations from specialist on board.    Consultants:   Cardiology   Procedures: echocardiogram on admission    Antimicrobials: doxycycline   Subjective: Pt has no new complaints and reports feeling better.  Objective: Vitals:   04/13/17 0304 04/13/17 0750 04/13/17 0840 04/13/17 0905  BP: 137/82 (!) 141/80   (!) 142/82  Pulse:  70 69 71  Resp:  (!) 21 18   Temp: 98.1 F (36.7 C)  98.1 F (36.7 C)   TempSrc: Oral  Oral   SpO2:  98% 99%   Weight:      Height:        Intake/Output Summary (Last 24 hours) at 04/13/2017 1109 Last data filed at 04/13/2017 0917 Gross per 24 hour  Intake -  Output 2115 ml  Net -2115 ml   Filed Weights   04/12/17 1318 04/12/17 2018  Weight: 68 kg (150 lb) 67.4 kg (148 lb 9.4 oz)    Examination:  General exam: Appears calm and comfortable, in nad. Respiratory system: Clear to auscultation. Respiratory effort normal. Equal chest rise.  Cardiovascular system: S1 & S2 heard, RRR. No JVD, murmurs Gastrointestinal system: Abdomen is nondistended, soft and nontender. No organomegaly or masses felt. Normal bowel sounds heard. Central nervous system: Alert and oriented. No focal neurological deficits. Extremities: Symmetric 5 x 5 power. Skin: No rashes, lesions or ulcers Psychiatry: Judgement and insight appear normal. Mood & affect appropriate.   Data Reviewed: I have personally reviewed following labs and imaging studies  CBC: Recent Labs  Lab 04/07/17 2304 04/09/17 0827 04/12/17 1320 04/13/17 0233  WBC 15.9* 9.2 8.2 10.7*  NEUTROABS  --   --  6.6  --   HGB 9.9* 8.7* 9.3* 9.4*  HCT 30.9* 26.6* 30.5* 29.8*  MCV 92.2 93.3 95.0 92.3  PLT 766* 664* 546* 093*   Basic Metabolic Panel: Recent Labs  Lab 04/07/17 2304 04/09/17 0827 04/12/17 1320 04/12/17 2029 04/13/17 0233  NA 135 132* 137  --  134*  K 3.8 3.7 3.8  --  3.3*  CL 100* 102 107  --  99*  CO2 21* 18* 16*  --  21*  GLUCOSE 123* 130* 86  --  82  BUN 19 17 15   --  13  CREATININE 0.86 0.78 0.76  --  0.82  CALCIUM 9.0 8.3* 8.7*  --  8.8*  MG  --   --   --  1.1*  --   PHOS  --   --   --  2.8  --    GFR: Estimated Creatinine Clearance: 72.3 mL/min (by C-G formula based on SCr of 0.82 mg/dL). Liver Function Tests: Recent Labs  Lab 04/12/17 1320 04/13/17 0233  AST 40 36  ALT 93* 86*    ALKPHOS 153* 162*  BILITOT 0.6 1.0  PROT 6.3* 6.2*  ALBUMIN 2.9* 2.8*   Recent Labs  Lab 04/08/17 0440 04/12/17 1741  LIPASE 20 22   No results for input(s): AMMONIA in the last 168 hours. Coagulation Profile: Recent Labs  Lab 04/08/17 0440  INR 1.16   Cardiac Enzymes: Recent Labs  Lab 04/08/17 0440 04/08/17 0929 04/08/17 1713  TROPONINI 0.07* 0.06* 0.05*   BNP (last 3 results) No results for input(s): PROBNP in the last 8760 hours. HbA1C: No results for input(s): HGBA1C in the last 72 hours. CBG: Recent Labs  Lab 04/08/17 2021 04/09/17 0029  GLUCAP 62* 116*   Lipid Profile: No results for input(s): CHOL, HDL, LDLCALC, TRIG, CHOLHDL, LDLDIRECT in the last 72 hours. Thyroid Function Tests: No results for input(s): TSH, T4TOTAL, FREET4, T3FREE, THYROIDAB in the last 72 hours. Anemia Panel: No results for input(s): VITAMINB12, FOLATE, FERRITIN, TIBC, IRON, RETICCTPCT in the last 72 hours. Sepsis Labs: Recent Labs  Lab 04/08/17 0440 04/08/17 0939 04/12/17 1413  PROCALCITON <0.10  --   --   LATICACIDVEN 1.7 1.3 1.75    Recent Results (from the past 240 hour(s))  Blood culture (routine x 2)     Status: None (Preliminary result)   Collection Time: 04/08/17  4:05 AM  Result Value Ref Range Status   Specimen Description BLOOD LEFT HAND  Final   Special Requests   Final    BOTTLES DRAWN AEROBIC AND ANAEROBIC Blood Culture adequate volume   Culture   Final    NO GROWTH 4 DAYS Performed at [facility], [place] [address], [place], [place] [address]    Report Status PENDING  Incomplete  Blood culture (routine x 2)     Status: None (Preliminary result)   Collection Time: 04/08/17  4:15 AM  Result Value Ref Range Status   Specimen Description BLOOD RIGHT HAND  Final   Special Requests IN PEDIATRIC BOTTLE Blood Culture adequate volume  Final   Culture   Final    NO GROWTH 4 DAYS Performed at [facility], [place] [address], [place], [place] [address]     Report Status PENDING  Incomplete  Culture, respiratory (NON-Expectorated)     Status: None   Collection Time: 04/08/17  5:46 AM  Result Value Ref Range Status   Specimen Description SPUTUM  Final   Special Requests NONE  Final   Gram Stain   Final    FEW WBC PRESENT, PREDOMINANTLY MONONUCLEAR FEW SQUAMOUS EPITHELIAL CELLS PRESENT FEW GRAM POSITIVE RODS RARE GRAM POSITIVE COCCI IN PAIRS RARE GRAM NEGATIVE RODS RARE BUDDING YEAST SEEN    Culture   Final    MODERATE Consistent with normal respiratory flora. Performed at [facility], [place]  [address], [place], [place] [address]    Report Status 04/10/2017 FINAL  Final  Urine Culture     Status: None   Collection Time: 04/08/17  1:26 PM  Result Value Ref Range Status   Specimen Description URINE, CLEAN CATCH  Final   Special Requests NONE  Final   Culture   Final    NO GROWTH Performed at [facility], [place] [address] [place], [place] [address]    Report Status 04/10/2017 FINAL  Final  MRSA PCR Screening     Status: None   Collection Time: 04/08/17  7:17 PM  Result Value Ref Range Status   MRSA by PCR NEGATIVE NEGATIVE Final    Comment:        The GeneXpert MRSA Assay (FDA approved for NASAL specimens only), is one component of a comprehensive MRSA colonization surveillance program. It is not intended to diagnose MRSA infection nor to guide or monitor treatment for MRSA infections. Performed at [facility], [place] [address] [place], [place] [address]   MRSA PCR Screening     Status: None   Collection Time: 04/12/17  6:23 PM  Result Value Ref Range Status   MRSA by PCR NEGATIVE NEGATIVE Final    Comment:        The GeneXpert MRSA Assay (FDA approved for NASAL specimens only), is one component of a comprehensive MRSA colonization surveillance program. It is not intended to diagnose MRSA infection nor to guide or monitor treatment for MRSA infections. Performed at [facility], [place] [address] [place], [place] [address]          Radiology Studies: Dg Chest 2 View  Result Date: 04/12/2017 CLINICAL DATA:  Triple bypass/SOB/dry cough x 3 weeks ago, nausea/diarrhea, nonsmoker. EXAM: CHEST  2 VIEW COMPARISON:  Chest CT, 04/08/2017.  Chest radiograph, 04/07/2017. FINDINGS: Changes from CABG surgery are stable. The cardiac silhouette is mildly enlarged. There is a left coronary artery stent. No mediastinal or hilar masses. No evidence of adenopathy. There is dependent lung opacity, right greater than left consistent with pneumonia and/or atelectasis. Small left pleural effusion. Remainder of the lungs is clear. No pneumothorax. Skeletal structures are intact. IMPRESSION: 1. No significant change from the prior CT or prior chest radiographs. Persistent lung base opacity, greater on the right, consistent with either atelectasis, pneumonia or a combination. Small left pleural effusion. Electronically Signed   By: Lajean Manes M.D.   On: 04/12/2017 13:40        Scheduled Meds: . aspirin  325 mg Oral Daily  . doxycycline  100 mg Oral Q12H  . furosemide  40 mg Intravenous BID  . metoprolol tartrate  25 mg Oral BID  . multivitamin with minerals  1 tablet Oral Daily   Continuous Infusions:   LOS: 0 days    Time spent: > 35 minutes  Velvet Bathe, MD [facility] Pager [phone]  If 7PM-7AM, please contact night-coverage [email or web address] Password [email or web address] 04/13/2017, 11:09 AM

## 2017-04-13 NOTE — Progress Notes (Signed)
Progress Note  Patient Name: Gabrielle Page Date of Encounter: 04/13/2017  Primary Cardiologist: Peter Martinique, MD   Subjective   Net out 1.7 L.  Feeling much improved.  No pericardial chest pain.  Remains in sinus rhythm.  Inpatient Medications    Scheduled Meds: . aspirin  325 mg Oral Daily  . doxycycline  100 mg Oral Q12H  . furosemide  40 mg Intravenous BID  . metoprolol tartrate  25 mg Oral BID  . multivitamin with minerals  1 tablet Oral Daily   Continuous Infusions:  PRN Meds: ibuprofen, ondansetron (ZOFRAN) IV, traMADol   Vital Signs    Vitals:   04/13/17 0750 04/13/17 0840 04/13/17 0905 04/13/17 1147  BP: (!) 141/80  (!) 142/82 105/74  Pulse: 70 69 71 68  Resp: (!) 21 18    Temp:  98.1 F (36.7 C)  98.1 F (36.7 C)  TempSrc:  Oral  Oral  SpO2: 98% 99%    Weight:      Height:        Intake/Output Summary (Last 24 hours) at 04/13/2017 1307 Last data filed at 04/13/2017 1100 Gross per 24 hour  Intake 390 ml  Output 2115 ml  Net -1725 ml   Filed Weights   04/12/17 1318 04/12/17 2018  Weight: 150 lb (68 kg) 148 lb 9.4 oz (67.4 kg)    Telemetry    Sinus rhythm- Personally Reviewed  ECG    Sinus rhythm, T wave flattening- Personally Reviewed  Physical Exam   GEN: No acute distress.   Neck: No JVD Cardiac: RRR, no murmurs, rubs, or gallops.  Respiratory: Clear to auscultation bilaterally. GI: Soft, nontender, non-distended  MS: No edema; No deformity. Neuro:  Nonfocal  Psych: Normal affect   Labs    Chemistry Recent Labs  Lab 04/09/17 0827 04/12/17 1320 04/13/17 0233  NA 132* 137 134*  K 3.7 3.8 3.3*  CL 102 107 99*  CO2 18* 16* 21*  GLUCOSE 130* 86 82  BUN 17 15 13   CREATININE 0.78 0.76 0.82  CALCIUM 8.3* 8.7* 8.8*  PROT  --  6.3* 6.2*  ALBUMIN  --  2.9* 2.8*  AST  --  40 36  ALT  --  93* 86*  ALKPHOS  --  153* 162*  BILITOT  --  0.6 1.0  GFRNONAA >60 >60 >60  GFRAA >60 >60 >60  ANIONGAP 12 14 14       Hematology Recent Labs  Lab 04/09/17 0827 04/12/17 1320 04/13/17 0233  WBC 9.2 8.2 10.7*  RBC 2.85* 3.21* 3.23*  HGB 8.7* 9.3* 9.4*  HCT 26.6* 30.5* 29.8*  MCV 93.3 95.0 92.3  MCH 30.5 29.0 29.1  MCHC 32.7 30.5 31.5  RDW 16.0* 15.8* 15.6*  PLT 664* 546* 661*    Cardiac Enzymes Recent Labs  Lab 04/08/17 0440 04/08/17 0929 04/08/17 1713  TROPONINI 0.07* 0.06* 0.05*    Recent Labs  Lab 04/07/17 2316 04/12/17 1411  TROPIPOC 0.03 0.01     BNP Recent Labs  Lab 04/12/17 1320  BNP 537.1*     DDimer No results for input(s): DDIMER in the last 168 hours.   Radiology    Dg Chest 2 View  Result Date: 04/12/2017 CLINICAL DATA:  Triple bypass/SOB/dry cough x 3 weeks ago, nausea/diarrhea, nonsmoker. EXAM: CHEST  2 VIEW COMPARISON:  Chest CT, 04/08/2017.  Chest radiograph, 04/07/2017. FINDINGS: Changes from CABG surgery are stable. The cardiac silhouette is mildly enlarged. There is a left coronary artery stent. No  mediastinal or hilar masses. No evidence of adenopathy. There is dependent lung opacity, right greater than left consistent with pneumonia and/or atelectasis. Small left pleural effusion. Remainder of the lungs is clear. No pneumothorax. Skeletal structures are intact. IMPRESSION: 1. No significant change from the prior CT or prior chest radiographs. Persistent lung base opacity, greater on the right, consistent with either atelectasis, pneumonia or a combination. Small left pleural effusion. Electronically Signed   By: Lajean Manes M.D.   On: 04/12/2017 13:40    Cardiac Studies   TTE - personally reviewed - Left ventricle: The cavity size was normal. There was mild   concentric hypertrophy. Systolic function was vigorous. The   estimated ejection fraction was in the range of 65% to 70%. Wall   motion was normal; there were no regional wall motion   abnormalities. Doppler parameters are consistent with abnormal   left ventricular relaxation (grade 1 diastolic  dysfunction).   Doppler parameters are consistent with elevated ventricular   end-diastolic filling pressure. - Aortic valve: There was no regurgitation. - Aortic root: The aortic root was normal in size. - Mitral valve: There was mild regurgitation. - Right ventricle: Systolic function was normal. - Right atrium: The atrium was normal in size. - Tricuspid valve: There was no regurgitation. - Pulmonic valve: There was no regurgitation. - Pulmonary arteries: Systolic pressure could not be accurately   estimated. - Inferior vena cava: The vessel was normal in size. - Pericardium, extracardiac: A mild pericardial effusion was   identified posterior to the heart.  Patient Profile     57 y.o. female with a history of coronary disease status post CABG 03/26/17 x3 vessels, GERD, chronic bronchitis, and asthma admitted with left shoulder pain, abdominal pain, pneumonia, and new pericardial effusion.  Assessment & Plan    1.  Pericardial effusion: Patient is felt much improved since diuresis of 1.7 L.  She is no longer having chest pain or shoulder pain.  Has mild residual effusion improved from prior.  Would continue with diuresis today with possible discharge tomorrow.  2.  Chest pain: Pain appeared to be pericardial in nature.  Her colchicine was stopped and she is felt much improved.  3. Postop atrial fibrillation: Amiodarone has been stopped.  We Nathanuel Cabreja continue to monitor for further atrial fibrillation.  4.  Diarrhea and vomiting: Possibly from colchicine versus amiodarone.  Both have been stopped.  She is feeling much improved.  For questions or updates, please contact [place] Please consult [email or web address] for contact info under Cardiology/STEMI.      Signed, Colbin Jovel Meredith Leeds, MD  04/13/2017, 1:07 PM

## 2017-04-14 DIAGNOSIS — R0602 Shortness of breath: Secondary | ICD-10-CM

## 2017-04-14 DIAGNOSIS — I313 Pericardial effusion (noninflammatory): Principal | ICD-10-CM

## 2017-04-14 MED ORDER — FUROSEMIDE 20 MG PO TABS: 20.0000 mg | ORAL_TABLET | Freq: Every day | ORAL | 11 refills | Status: DC | PRN

## 2017-04-14 NOTE — Progress Notes (Signed)
Pt agreed and informed of discharge. Pt alert and oriented X4, pt O2 saturation 98% on room air, vital signs stable. Removed pt IV and went over discharge instructions. No questions asked by pt and pt signed discharged paperwork. Pt wheeled off unit by nurse tech with discharge paperwork, clothing, and cell phone.

## 2017-04-14 NOTE — Discharge Summary (Signed)
Physician Discharge Summary  Gabrielle Page ONG:[id number] DOB: Jul 09, 1960 DOA: 04/12/2017  PCP: [name], NP  Admit date: 04/12/2017 Discharge date: 04/14/2017  Time spent: > 35 minutes  Recommendations for Outpatient Follow-up:  1. Pt had nausea and emesis which resolved after discontinuation of amiodarone and colchicine 2. Ensure follow up with CT surgeon and cardiology post hospital discharge 3. Will d/c on lasix 20 mg po qd prn fluid   Discharge Diagnoses:  Active Problems:   Pericardial effusion   S/P CABG x 3   CAD (coronary artery disease)   Lobar pneumonia (HCC)   GERD (gastroesophageal reflux disease)   Nausea and vomiting   Discharge Condition: stable  Diet recommendation: Heart healthy  Filed Weights   04/12/17 1318 04/12/17 2018 04/14/17 0800  Weight: 68 kg (150 lb) 67.4 kg (148 lb 9.4 oz) 63.7 kg (140 lb 6.9 oz)    History of present illness:  57 y/o with history of CABG 03/2017 x 3 vessel who developed pericardial effusion post op. Discharged on amiodarone for post procedure afib and colchicine and nsaids for pericardial effusion. Pt presented to the hospital with fluid overload, nausea and emesis.  Hospital Course:  Active Problems: SOB - Most likely from patient being fluid overloaded and improved after IV Lasix administration.  - plan is to d/c on oral lasix 20 mg prn fluid (see last cardiology note for details)    Pericardial effusion - Patient does not meet echo criteria for tamponade per echocardiogram done at admission - nausea and emesis resolved after cessation of colchicine    S/P CABG x 3/ CAD (coronary artery disease) - CT surgery consulted continue current regimen    Lobar pneumonia ([place]) - continue doxycycline started last admission for presumed pna    GERD (gastroesophageal reflux disease) - Stable currently    Nausea and vomiting - Resolved after discontinuation of colchicine. And amiodarone (amiodarone started after post  procedure afib)  Postop atrial fibrillation: no further AF seen. Continue to hold amiodarone   Procedures:  none  Consultations:  Cardiology  Cardiothoracic surgery  Discharge Exam: Vitals:   04/14/17 0509 04/14/17 0800  BP: 106/68 107/67  Pulse: 64 69  Resp:  (!) 22  Temp: 98.9 F (37.2 C) 97.9 F (36.6 C)  SpO2: 95% 98%    General: Pt in nad, alert and awake Cardiovascular: rrr, no rubs Respiratory: no increased wob, no wheezes  Discharge Instructions   Discharge Instructions    Call MD for:  severe uncontrolled pain   Complete by:  As directed    Call MD for:  temperature >100.4   Complete by:  As directed    Diet - low sodium heart healthy   Complete by:  As directed    Discharge instructions   Complete by:  As directed    Please follow up with your cardiologist after hospital discharge within the next 1-2 weeks or sooner should any new concerns arise.   Increase activity slowly   Complete by:  As directed      Allergies as of 04/14/2017      Reactions   Compazine [prochlorperazine Edisylate] Other (See Comments)   SEIZURE   Other Other (See Comments)   Steroids cause uncontrollable coughing      Medication List    STOP taking these medications   amiodarone 200 MG tablet Commonly known as:  PACERONE   colchicine 0.6 MG tablet   HYDROmorphone 2 MG tablet Commonly known as:  DILAUDID  TAKE these medications   aspirin 325 MG EC tablet Take 1 tablet (325 mg total) by mouth daily.   doxycycline 100 MG tablet Commonly known as:  VIBRA-TABS Take 1 tablet (100 mg total) by mouth every 12 (twelve) hours.   furosemide 20 MG tablet Commonly known as:  LASIX Take 1 tablet (20 mg total) by mouth daily as needed for fluid.   ibuprofen 400 MG tablet Commonly known as:  ADVIL,MOTRIN Take 1 tablet (400 mg total) by mouth every 6 (six) hours as needed for fever, headache or mild pain.   metoprolol tartrate 25 MG tablet Commonly known as:   LOPRESSOR Take 1 tablet (25 mg total) by mouth 2 (two) times daily.   multivitamin with minerals Tabs tablet Take 1 tablet by mouth daily.   traMADol 50 MG tablet Commonly known as:  ULTRAM Take 1 tablet (50 mg total) by mouth every 6 (six) hours as needed. What changed:  reasons to take this      Allergies  Allergen Reactions  . Compazine [Prochlorperazine Edisylate] Other (See Comments)    SEIZURE  . Other Other (See Comments)    Steroids cause uncontrollable coughing      The results of significant diagnostics from this hospitalization (including imaging, microbiology, ancillary and laboratory) are listed below for reference.    Significant Diagnostic Studies: Dg Chest 2 View  Result Date: 04/12/2017 CLINICAL DATA:  Triple bypass/SOB/dry cough x 3 weeks ago, nausea/diarrhea, nonsmoker. EXAM: CHEST  2 VIEW COMPARISON:  Chest CT, 04/08/2017.  Chest radiograph, 04/07/2017. FINDINGS: Changes from CABG surgery are stable. The cardiac silhouette is mildly enlarged. There is a left coronary artery stent. No mediastinal or hilar masses. No evidence of adenopathy. There is dependent lung opacity, right greater than left consistent with pneumonia and/or atelectasis. Small left pleural effusion. Remainder of the lungs is clear. No pneumothorax. Skeletal structures are intact. IMPRESSION: 1. No significant change from the prior CT or prior chest radiographs. Persistent lung base opacity, greater on the right, consistent with either atelectasis, pneumonia or a combination. Small left pleural effusion. Electronically Signed   By: Lajean Manes M.D.   On: 04/12/2017 13:40   Dg Chest 2 View  Result Date: 04/07/2017 CLINICAL DATA:  Acute chest pain. EXAM: CHEST  2 VIEW COMPARISON:  03/30/2017 and prior radiographs FINDINGS: Cardiomegaly and CABG changes again noted. Mild RIGHT basilar atelectasis and trace bilateral pleural effusions again noted. There is no evidence of pulmonary edema, airspace  disease or pneumothorax. No acute bony abnormalities are present. IMPRESSION: Unchanged appearance of the chest with cardiomegaly, RIGHT basilar atelectasis and trace bilateral pleural effusions. Electronically Signed   By: Margarette Canada M.D.   On: 04/07/2017 23:34   Dg Chest 2 View  Result Date: 03/30/2017 CLINICAL DATA:  CABG 03/26/2017 EXAM: CHEST  2 VIEW COMPARISON:  03/29/2017 FINDINGS: Elevated right hemidiaphragm unchanged. Right pleural effusion and right lower lobe atelectasis unchanged. Mild left lower lobe atelectasis improved from the prior study. Minimal left effusion. Cardiac enlargement.  Mild vascular congestion without edema. IMPRESSION: Elevated right hemidiaphragm. Right lower lobe atelectasis and effusion unchanged. Improvement in mild left lower lobe atelectasis. Electronically Signed   By: Franchot Gallo M.D.   On: 03/30/2017 09:00   Dg Chest 2 View  Result Date: 03/29/2017 CLINICAL DATA:  CABG EXAM: CHEST  2 VIEW COMPARISON:  03/28/2017 FINDINGS: Central venous catheter removed. Mediastinal drain removed. No pneumothorax. Elevated right hemidiaphragm unchanged.  Bibasilar atelectasis. Improvement in pulmonary edema. Small bilateral pleural  effusions have improved. IMPRESSION: Decrease in pulmonary edema. No change in bibasilar atelectasis. Improvement in small pleural effusions. Electronically Signed   By: Franchot Gallo M.D.   On: 03/29/2017 07:33   Dg Chest 2 View  Result Date: 03/23/2017 CLINICAL DATA:  Chest pain.  Preop for heart surgery EXAM: CHEST  2 VIEW COMPARISON:  None. FINDINGS: Normal heart size and mediastinal contours. Coronary stent noted. No acute infiltrate or edema. No effusion or pneumothorax. No acute osseous findings. IMPRESSION: Negative chest. Electronically Signed   By: Monte Fantasia M.D.   On: 03/23/2017 09:33   Ct Chest W Contrast  Result Date: 03/24/2017 CLINICAL DATA:  Pleural effusion, chest pain. EXAM: CT CHEST WITH CONTRAST TECHNIQUE:  Multidetector CT imaging of the chest was performed during intravenous contrast administration. CONTRAST:  61mL ISOVUE-300 IOPAMIDOL (ISOVUE-300) INJECTION 61% COMPARISON:  Chest x-ray 03/23/2017.  Chest CT 11/05/2016. FINDINGS: Cardiovascular: Stent noted in the left anterior descending coronary artery. Heart is normal size. Aorta is normal caliber. No filling defects in the pulmonary arteries to suggest pulmonary emboli. Mediastinum/Nodes: No mediastinal, hilar, or axillary adenopathy. Trachea and esophagus are unremarkable. Small hiatal hernia. Lungs/Pleura: Lungs are clear. No focal airspace opacities or suspicious nodules. No effusions. Upper Abdomen: Pneumobilia noted within the liver and common bile duct, presumably from prior sphincterotomy. Musculoskeletal: Chest wall soft tissues are unremarkable. No acute bony abnormality. IMPRESSION: No evidence of pulmonary embolus. No acute cardiopulmonary disease. Lad stent noted. Pneumobilia, presumably from prior sphincterotomy. Recommend clinical correlation. Electronically Signed   By: Rolm Baptise M.D.   On: 03/24/2017 14:13   Ct Angio Chest Pe W And/or Wo Contrast  Result Date: 04/08/2017 CLINICAL DATA:  Mid sternal chest pain. EXAM: CT ANGIOGRAPHY CHEST WITH CONTRAST TECHNIQUE: Multidetector CT imaging of the chest was performed using the standard protocol during bolus administration of intravenous contrast. Multiplanar CT image reconstructions and MIPs were obtained to evaluate the vascular anatomy. CONTRAST:  171mL ISOVUE-370 IOPAMIDOL (ISOVUE-370) INJECTION 76% COMPARISON:  Chest x-ray 04/07/2017.  Chest CT 03/24/2017. FINDINGS: Cardiovascular: No filling defects in the pulmonary arteries to suggest pulmonary emboli. Cardiomegaly. Moderate pericardial effusion, new since prior study. No evidence of aortic aneurysm. Prior CABG. Mediastinum/Nodes: No mediastinal, hilar, or axillary adenopathy. Lungs/Pleura: Small bilateral pleural effusions, new since  prior study. Airspace opacity noted in the right lower lobe with elevation of the right hemidiaphragm. This could reflect atelectasis or pneumonia. Left lower lobe linear atelectasis noted. Mild ground-glass opacities in the lungs could reflect early edema. Upper Abdomen: Pneumobilia noted within the liver, stable. Imaging into the upper abdomen shows no acute findings. Musculoskeletal: Chest wall soft tissues are unremarkable. No acute bony abnormality. Review of the MIP images confirms the above findings. IMPRESSION: No evidence of pulmonary embolus. Cardiomegaly, prior CABG. New moderate pericardial effusion and small bilateral pleural effusions. Elevation of the right hemidiaphragm. Airspace opacity at the right lung base in the right lower lobe could reflect pneumonia or atelectasis. Linear atelectasis in the left base. Scattered ground-glass opacities in the lungs, likely early edema. No evidence of pneumothorax. Electronically Signed   By: Rolm Baptise M.D.   On: 04/08/2017 02:55   Dg Chest Port 1 View  Result Date: 03/28/2017 CLINICAL DATA:  Status post CABG 2 days ago. EXAM: PORTABLE CHEST 1 VIEW COMPARISON:  Portable chest x-ray of March 27, 2017. FINDINGS: The lung volumes are low. The hemidiaphragms are obscured. There small bilateral pleural effusions. The cardiac silhouette is enlarged and the margins indistinct. The pulmonary vascularity  is prominent centrally. There is mild interstitial edema peripherally. The left chest tube is been removed. The mediastinal drain remains. There is no pneumothorax. The Swan-Ganz catheter has been withdrawn. The right internal jugular Cordis sheath projects over the proximal SVC. IMPRESSION: CHF with mild interstitial edema. Bibasilar atelectasis or pneumonia with small bilateral pleural effusions. No pneumothorax. Electronically Signed   By: David  Martinique M.D.   On: 03/28/2017 07:39   Dg Chest Port 1 View  Result Date: 03/27/2017 CLINICAL DATA:  Status  postextubation. Recent coronary artery bypass grafting EXAM: PORTABLE CHEST 1 VIEW COMPARISON:  March 26, 2017 FINDINGS: Endotracheal tube and nasogastric tube have been removed. Swan-Ganz catheter tip is in the proximal right main pulmonary artery. There is a mediastinal drain as well as a chest tube on the left. Temporary pacemaker leads are attached to the right heart. No pneumothorax. There is atelectatic change in the left lower lobe as well as in the medial right base. Lungs elsewhere clear. Heart is upper normal in size with pulmonary vascularity within normal limits. No adenopathy. IMPRESSION: Tube and catheter positions as described. No pneumothorax. Bibasilar atelectasis. No consolidation. Stable cardiac silhouette. Electronically Signed   By: Lowella Grip III M.D.   On: 03/27/2017 08:03   Dg Chest Port 1 View  Result Date: 03/26/2017 CLINICAL DATA:  Post CABG EXAM: PORTABLE CHEST 1 VIEW COMPARISON:  03/23/2017 FINDINGS: Changes of CABG. Endotracheal tube is 4.5 cm above the carina. Left chest tube is in place without pneumothorax. NG tube enters the stomach. Swan-Ganz catheter tip is in the main Pulmonary artery. Low lung volumes with left base and perihilar atelectasis. No pneumothorax or effusions. IMPRESSION: Postoperative changes. No pneumothorax. Areasof atelectasis in the left base and perihilar regions. Electronically Signed   By: Rolm Baptise M.D.   On: 03/26/2017 14:04   US Liver Doppler  Result Date: 03/24/2017 CLINICAL DATA:  Elevated LFTs EXAM: DUPLEX ULTRASOUND OF LIVER TECHNIQUE: Color and duplex Doppler ultrasound was performed to evaluate the hepatic in-flow and out-flow vessels. 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 IMPRESSION: Normal portal vein and  hepatic vein velocities and waveforms. Electronically Signed   By: Suzy Bouchard M.D.   On: 03/24/2017 10:48   US Abdomen Limited Ruq  Result Date: 03/24/2017 CLINICAL DATA:  Elevated liver enzymes EXAM: ULTRASOUND ABDOMEN LIMITED RIGHT UPPER QUADRANT COMPARISON:  CT 11/05/2016 FINDINGS: Gallbladder: Surgically absent Common bile duct: Diameter: Normal 3.3 mm Liver: Normal echogenicity. No biliary duct dilatation. Small amount pneumobilia in the LEFT hepatic lobe compares with prior CT. Portal vein is patent on color Doppler imaging with normal direction of blood flow towards the liver. IMPRESSION: 1. No acute findings the liver. Normal echogenicity. No biliary duct dilatation. 2. Pneumobilia presumed related to prior sphincterotomy and not changed from comparison CT Electronically Signed   By: Suzy Bouchard M.D.   On: 03/24/2017 10:53    Microbiology: Recent Results (from the past 240 hour(s))  Blood culture (routine x 2)     Status: None   Collection Time: 04/08/17  4:05 AM  Result Value Ref Range Status  Specimen Description BLOOD LEFT HAND  Final   Special Requests   Final    BOTTLES DRAWN AEROBIC AND ANAEROBIC Blood Culture adequate volume   Culture   Final    NO GROWTH 5 DAYS Performed at [facility], [address] [place], [place] [address]    Report Status 04/13/2017 FINAL  Final  Blood culture (routine x 2)     Status: None   Collection Time: 04/08/17  4:15 AM  Result Value Ref Range Status   Specimen Description BLOOD RIGHT HAND  Final   Special Requests IN PEDIATRIC BOTTLE Blood Culture adequate volume  Final   Culture   Final    NO GROWTH 5 DAYS Performed at [facility], [place] [address], [place], [place] [address]    Report Status 04/13/2017 FINAL  Final  Culture, respiratory  (NON-Expectorated)     Status: None   Collection Time: 04/08/17  5:46 AM  Result Value Ref Range Status   Specimen Description SPUTUM  Final   Special Requests NONE  Final   Gram Stain   Final    FEW WBC PRESENT, PREDOMINANTLY MONONUCLEAR FEW SQUAMOUS EPITHELIAL CELLS PRESENT FEW GRAM POSITIVE RODS RARE GRAM POSITIVE COCCI IN PAIRS RARE GRAM NEGATIVE RODS RARE BUDDING YEAST SEEN    Culture   Final    MODERATE Consistent with normal respiratory flora. Performed at [facility], [place] [address] [place], [place] [address]    Report Status 04/10/2017 FINAL  Final  Urine Culture     Status: None   Collection Time: 04/08/17  1:26 PM  Result Value Ref Range Status   Specimen Description URINE, CLEAN CATCH  Final   Special Requests NONE  Final   Culture   Final    NO GROWTH Performed at [facility], [place] [address] [place], [place] [address]    Report Status 04/10/2017 FINAL  Final  MRSA PCR Screening     Status: None   Collection Time: 04/08/17  7:17 PM  Result Value Ref Range Status   MRSA by PCR NEGATIVE NEGATIVE Final    Comment:        The GeneXpert MRSA Assay (FDA approved for NASAL specimens only), is one component of a comprehensive MRSA colonization surveillance program. It is not intended to diagnose MRSA infection nor to guide or monitor treatment for MRSA infections. Performed at [facility], [place] [address] [place], [place] [address]   MRSA PCR Screening     Status: None   Collection Time: 04/12/17  6:23 PM  Result Value Ref Range Status   MRSA by PCR NEGATIVE NEGATIVE Final    Comment:        The GeneXpert MRSA Assay (FDA approved for NASAL specimens only), is one component of a comprehensive MRSA colonization surveillance program. It is not intended to diagnose MRSA infection nor to guide or monitor treatment for MRSA infections. Performed at [facility], [place] [address], [place], [place] [address]      Labs: Basic  Metabolic Panel: Recent Labs  Lab 04/07/17 2304 04/09/17 0827 04/12/17 1320 04/12/17 2029 04/13/17 0233  NA 135 132* 137  --  134*  K 3.8 3.7 3.8  --  3.3*  CL 100* 102 107  --  99*  CO2 21* 18* 16*  --  21*  GLUCOSE 123* 130* 86  --  82  BUN 19 17 15   --  13  CREATININE 0.86 0.78  0.76  --  0.82  CALCIUM 9.0 8.3* 8.7*  --  8.8*  MG  --   --   --  1.1*  --   PHOS  --   --   --  2.8  --    Liver Function Tests: Recent Labs  Lab 04/12/17 1320 04/13/17 0233  AST 40 36  ALT 93* 86*  ALKPHOS 153* 162*  BILITOT 0.6 1.0  PROT 6.3* 6.2*  ALBUMIN 2.9* 2.8*   Recent Labs  Lab 04/08/17 0440 04/12/17 1741  LIPASE 20 22   No results for input(s): AMMONIA in the last 168 hours. CBC: Recent Labs  Lab 04/07/17 2304 04/09/17 0827 04/12/17 1320 04/13/17 0233  WBC 15.9* 9.2 8.2 10.7*  NEUTROABS  --   --  6.6  --   HGB 9.9* 8.7* 9.3* 9.4*  HCT 30.9* 26.6* 30.5* 29.8*  MCV 92.2 93.3 95.0 92.3  PLT 766* 664* 546* 661*   Cardiac Enzymes: Recent Labs  Lab 04/08/17 0440 04/08/17 0929 04/08/17 1713  TROPONINI 0.07* 0.06* 0.05*   BNP: BNP (last 3 results) Recent Labs    04/12/17 1320  BNP 537.1*    ProBNP (last 3 results) No results for input(s): PROBNP in the last 8760 hours.  CBG: Recent Labs  Lab 04/08/17 2021 04/09/17 0029  GLUCAP 62* 116*     Signed:  Velvet Bathe MD.  [facility] 04/14/2017, 8:40 AM

## 2017-04-14 NOTE — Progress Notes (Signed)
Progress Note  Patient Name: Gabrielle Page Date of Encounter: 04/14/2017  Primary Cardiologist: Peter Martinique, MD   Subjective   Net out 2.5L and feeling much improved. TTE yesterday with smaller pericardial effusion. Patient has ambulated without issue.  Inpatient Medications    Scheduled Meds: . aspirin  325 mg Oral Daily  . doxycycline  100 mg Oral Q12H  . furosemide  40 mg Intravenous BID  . metoprolol tartrate  25 mg Oral BID  . multivitamin with minerals  1 tablet Oral Daily   Continuous Infusions:  PRN Meds: ibuprofen, ondansetron (ZOFRAN) IV, traMADol   Vital Signs    Vitals:   04/13/17 1934 04/13/17 2143 04/13/17 2319 04/14/17 0509  BP: 115/70 103/66 114/77 106/68  Pulse: 68 68 65 64  Resp:      Temp: 98.3 F (36.8 C)  98.5 F (36.9 C) 98.9 F (37.2 C)  TempSrc: Oral  Oral Oral  SpO2: 98%  96% 95%  Weight:      Height:        Intake/Output Summary (Last 24 hours) at 04/14/2017 0804 Last data filed at 04/14/2017 0510 Gross per 24 hour  Intake 985 ml  Output 1790 ml  Net -805 ml   Filed Weights   04/12/17 1318 04/12/17 2018  Weight: 150 lb (68 kg) 148 lb 9.4 oz (67.4 kg)    Telemetry    Sinus rhythm- Personally Reviewed  ECG    None new - Personally Reviewed  Physical Exam   GEN: Well nourished, well developed, in no acute distress  HEENT: normal  Neck: no JVD, carotid bruits, or masses Cardiac: RRR; no murmurs, rubs, or gallops,no edema  Respiratory:  clear to auscultation bilaterally, normal work of breathing GI: soft, nontender, nondistended, + BS MS: no deformity or atrophy  Skin: warm and dry Neuro:  Strength and sensation are intact Psych: euthymic mood, full affect   Labs    Chemistry Recent Labs  Lab 04/09/17 0827 04/12/17 1320 04/13/17 0233  NA 132* 137 134*  K 3.7 3.8 3.3*  CL 102 107 99*  CO2 18* 16* 21*  GLUCOSE 130* 86 82  BUN 17 15 13   CREATININE 0.78 0.76 0.82  CALCIUM 8.3* 8.7* 8.8*  PROT  --  6.3*  6.2*  ALBUMIN  --  2.9* 2.8*  AST  --  40 36  ALT  --  93* 86*  ALKPHOS  --  153* 162*  BILITOT  --  0.6 1.0  GFRNONAA >60 >60 >60  GFRAA >60 >60 >60  ANIONGAP 12 14 14      Hematology Recent Labs  Lab 04/09/17 0827 04/12/17 1320 04/13/17 0233  WBC 9.2 8.2 10.7*  RBC 2.85* 3.21* 3.23*  HGB 8.7* 9.3* 9.4*  HCT 26.6* 30.5* 29.8*  MCV 93.3 95.0 92.3  MCH 30.5 29.0 29.1  MCHC 32.7 30.5 31.5  RDW 16.0* 15.8* 15.6*  PLT 664* 546* 661*    Cardiac Enzymes Recent Labs  Lab 04/08/17 0440 04/08/17 0929 04/08/17 1713  TROPONINI 0.07* 0.06* 0.05*    Recent Labs  Lab 04/07/17 2316 04/12/17 1411  TROPIPOC 0.03 0.01     BNP Recent Labs  Lab 04/12/17 1320  BNP 537.1*     DDimer No results for input(s): DDIMER in the last 168 hours.   Radiology    Dg Chest 2 View  Result Date: 04/12/2017 CLINICAL DATA:  Triple bypass/SOB/dry cough x 3 weeks ago, nausea/diarrhea, nonsmoker. EXAM: CHEST  2 VIEW COMPARISON:  Chest CT,  04/08/2017.  Chest radiograph, 04/07/2017. FINDINGS: Changes from CABG surgery are stable. The cardiac silhouette is mildly enlarged. There is a left coronary artery stent. No mediastinal or hilar masses. No evidence of adenopathy. There is dependent lung opacity, right greater than left consistent with pneumonia and/or atelectasis. Small left pleural effusion. Remainder of the lungs is clear. No pneumothorax. Skeletal structures are intact. IMPRESSION: 1. No significant change from the prior CT or prior chest radiographs. Persistent lung base opacity, greater on the right, consistent with either atelectasis, pneumonia or a combination. Small left pleural effusion. Electronically Signed   By: Lajean Manes M.D.   On: 04/12/2017 13:40    Cardiac Studies   TTE - personally reviewed - Left ventricle: The cavity size was normal. There was mild   concentric hypertrophy. Systolic function was vigorous. The   estimated ejection fraction was in the range of 65% to 70%.  Wall   motion was normal; there were no regional wall motion   abnormalities. Doppler parameters are consistent with abnormal   left ventricular relaxation (grade 1 diastolic dysfunction).   Doppler parameters are consistent with elevated ventricular   end-diastolic filling pressure. - Aortic valve: There was no regurgitation. - Aortic root: The aortic root was normal in size. - Mitral valve: There was mild regurgitation. - Right ventricle: Systolic function was normal. - Right atrium: The atrium was normal in size. - Tricuspid valve: There was no regurgitation. - Pulmonic valve: There was no regurgitation. - Pulmonary arteries: Systolic pressure could not be accurately   estimated. - Inferior vena cava: The vessel was normal in size. - Pericardium, extracardiac: A mild pericardial effusion was   identified posterior to the heart.  Patient Profile     57 y.o. female with a history of coronary disease status post CABG 03/26/17 x3 vessels, GERD, chronic bronchitis, and asthma admitted with left shoulder pain, abdominal pain, pneumonia, and new pericardial effusion.  Assessment & Plan    1.  Pericardial effusion: Effusion has reduced in size on most recent echo and this not causing tamponade. Patient has diuresed up to 2.5L net since admission. She appears euvolemic today and thus is likely ready for discharge. No medication changes at this time. Patient should weigh herself daily and if weight increases by 2lbs in a day should take 20 mg lasix as needed.  2.  Chest pain: appeared to be pericardial in nature. No current pain. No changes.  3. Postop atrial fibrillation: no further AF seen. Continue to hold amiodarone.  4.  Diarrhea and vomiting: continue to hold amiodarone and colchicine.  For questions or updates, please contact [facility] Please consult [email or web address] for contact info under Cardiology/STEMI.      Signed, Gesenia Bantz Meredith Leeds, MD  04/14/2017, 8:04 AM

## 2017-04-15 NOTE — Telephone Encounter (Signed)
Patient has been to the ER.

## 2017-04-17 ENCOUNTER — Encounter: Payer: Self-pay | Admitting: Critical Care Medicine

## 2017-04-17 ENCOUNTER — Ambulatory Visit: Payer: Self-pay | Attending: Critical Care Medicine | Admitting: Critical Care Medicine

## 2017-04-17 ENCOUNTER — Other Ambulatory Visit: Payer: Self-pay

## 2017-04-17 VITALS — BP 107/72 | HR 66 | Temp 98.6°F | Resp 12 | Wt 139.0 lb

## 2017-04-17 DIAGNOSIS — Z7982 Long term (current) use of aspirin: Secondary | ICD-10-CM | POA: Insufficient documentation

## 2017-04-17 DIAGNOSIS — Z818 Family history of other mental and behavioral disorders: Secondary | ICD-10-CM | POA: Insufficient documentation

## 2017-04-17 DIAGNOSIS — I2583 Coronary atherosclerosis due to lipid rich plaque: Secondary | ICD-10-CM | POA: Insufficient documentation

## 2017-04-17 DIAGNOSIS — Z951 Presence of aortocoronary bypass graft: Secondary | ICD-10-CM | POA: Insufficient documentation

## 2017-04-17 DIAGNOSIS — I5032 Chronic diastolic (congestive) heart failure: Secondary | ICD-10-CM

## 2017-04-17 DIAGNOSIS — E876 Hypokalemia: Secondary | ICD-10-CM | POA: Insufficient documentation

## 2017-04-17 DIAGNOSIS — Z833 Family history of diabetes mellitus: Secondary | ICD-10-CM | POA: Insufficient documentation

## 2017-04-17 DIAGNOSIS — I252 Old myocardial infarction: Secondary | ICD-10-CM | POA: Insufficient documentation

## 2017-04-17 DIAGNOSIS — Z811 Family history of alcohol abuse and dependence: Secondary | ICD-10-CM | POA: Insufficient documentation

## 2017-04-17 DIAGNOSIS — I2510 Atherosclerotic heart disease of native coronary artery without angina pectoris: Secondary | ICD-10-CM

## 2017-04-17 DIAGNOSIS — I313 Pericardial effusion (noninflammatory): Secondary | ICD-10-CM | POA: Insufficient documentation

## 2017-04-17 DIAGNOSIS — J181 Lobar pneumonia, unspecified organism: Secondary | ICD-10-CM | POA: Insufficient documentation

## 2017-04-17 DIAGNOSIS — R945 Abnormal results of liver function studies: Secondary | ICD-10-CM

## 2017-04-17 DIAGNOSIS — I3139 Other pericardial effusion (noninflammatory): Secondary | ICD-10-CM

## 2017-04-17 DIAGNOSIS — R7989 Other specified abnormal findings of blood chemistry: Secondary | ICD-10-CM

## 2017-04-17 DIAGNOSIS — Z79899 Other long term (current) drug therapy: Secondary | ICD-10-CM | POA: Insufficient documentation

## 2017-04-17 DIAGNOSIS — Z87442 Personal history of urinary calculi: Secondary | ICD-10-CM | POA: Insufficient documentation

## 2017-04-17 DIAGNOSIS — Z9889 Other specified postprocedural states: Secondary | ICD-10-CM | POA: Insufficient documentation

## 2017-04-17 DIAGNOSIS — Z888 Allergy status to other drugs, medicaments and biological substances status: Secondary | ICD-10-CM | POA: Insufficient documentation

## 2017-04-17 DIAGNOSIS — Z8249 Family history of ischemic heart disease and other diseases of the circulatory system: Secondary | ICD-10-CM | POA: Insufficient documentation

## 2017-04-17 HISTORY — DX: Chronic diastolic (congestive) heart failure: I50.32

## 2017-04-17 HISTORY — DX: Hypokalemia: E87.6

## 2017-04-17 NOTE — Progress Notes (Signed)
Hospitalization f/u: open heart surgery

## 2017-04-17 NOTE — Assessment & Plan Note (Signed)
Lobar pneumonia Plan  Finish course ABX

## 2017-04-17 NOTE — Patient Instructions (Signed)
Finish antibiotic doxycycline No other medication changes Obtain labs today : BMP to check potassium level Keep cardiology and Cardiac surgery appointments A primary care appointment will be made Return to Dr Joya Gaskins as needed

## 2017-04-17 NOTE — Assessment & Plan Note (Signed)
Post CABG pericardial effusion ,should resolve observation

## 2017-04-17 NOTE — Assessment & Plan Note (Signed)
Chronic diastolic CHF ischemic s/p CABG and recent diuresis Cont lasix chk potassium level

## 2017-04-17 NOTE — Assessment & Plan Note (Signed)
chk bmp

## 2017-04-17 NOTE — Progress Notes (Signed)
Subjective:    Patient ID: Gabrielle Page, female    DOB: Dec 12, 1960, 57 y.o.   MRN: [id number]  Congestive Heart Failure  Presents for follow-up visit. Associated symptoms include fatigue. Pertinent negatives include no abdominal pain, chest pain, chest pressure, claudication, edema, muscle weakness, near-syncope, nocturia, orthopnea, palpitations, paroxysmal nocturnal dyspnea, shortness of breath or unexpected weight change. The symptoms have been resolved. Compliance with total regimen is 76-100%. Compliance with diet is 76-100%. Compliance with exercise is 76-100%. Compliance with medications is 76-100%.   Recommendations for Outpatient Follow-up:  1. Pt had nausea and emesis which resolved after discontinuation of amiodarone and colchicine 2. Ensure follow up with CT surgeon and cardiology post hospital discharge 3. Will d/c on lasix 20 mg po qd prn fluid   Discharge Diagnoses:  Active Problems:   Pericardial effusion   S/P CABG x 3   CAD (coronary artery disease)   Lobar pneumonia (HCC)   GERD (gastroesophageal reflux disease)   Nausea and vomiting  Hospital Course:  Active Problems: SOB -Most likely from patient being fluid overloaded and improved after IV Lasix administration.  - plan is to d/c on oral lasix 20 mg prn fluid (see last cardiology note for details)  Pericardial effusion -Patient does not meet echo criteria fortamponade per echocardiogram done at admission - nausea and emesis resolved after cessation of colchicine  S/P CABG x 3/CAD (coronary artery disease) - CT surgery consulted continue current regimen  Lobar pneumonia ([place]) -continue doxycycline started last admission for presumed pna  GERD (gastroesophageal reflux disease) -Stable currently  Nausea and vomiting -Resolved after discontinuation of colchicine. And amiodarone (amiodarone started after post procedure afib)  Postop atrial fibrillation:no further AF seen.  Continue to hold amiodarone   04/17/2017  F/u visit from hospital stay   3/1 `  First visit 03/21/17- 03/31/17 with CAD unstable angina and CABG,  Then returned 2/24-2/27  readm with Chest pain and dx R lung PNA and pericardial effusion.   rx colchine nsaids,  IV to PO abx.   Then returned 3/1 -3/3  With reaction to amiodarone and colchicine and 10# fluid overload.  Pt had meds stopped and diuresed .  Pt had post op Afib. And rx amiodarone and then stopped d/t nausea and vomiting  Since d/c , is tired but dyspnea is better,  No real cough, dyspnea is better No fever.  No edema in feet  Never smoker.  Weight is 137-139 normal    Past Medical History:  Diagnosis Date  . Asthma   . Chronic bronchitis ([place])    "usually get it q yr" (11/05/2016)  . GERD (gastroesophageal reflux disease)   . Headache    "weekly" (11/05/2016)  . Heart murmur    "dx'd when I was a child"  . Hepatitis 1991   "when I had my gallbladder out" (11/05/2016)  . History of blood transfusion 1985   S/P childbirth  . History of kidney stones   . NSTEMI (non-ST elevated myocardial infarction) ([place]) 11/05/2016   9/18 PCI/DESx1 to p/mLAD  . Pancreatitis 1991  . Pneumonia    "twice" (11/05/2016)     Family History  Problem Relation Age of Onset  . Hypertension Father   . Alcohol abuse Father   . Heart attack Father   . Diabetes Mellitus II Father   . Suicidality Mother      Social History   Socioeconomic History  . Marital status: Divorced    Spouse name: Not on file  .  Number of children: Not on file  . Years of education: Not on file  . Highest education level: Not on file  Social Needs  . Financial resource strain: Not on file  . Food insecurity - worry: Not on file  . Food insecurity - inability: Not on file  . Transportation needs - medical: Not on file  . Transportation needs - non-medical: Not on file  Occupational History  . Not on file  Tobacco Use  . Smoking status: Never Smoker  .  Smokeless tobacco: Never Used  Substance and Sexual Activity  . Alcohol use: Yes    Comment: 11/05/2016 "maybe 1 glass of wine/month"  . Drug use: No  . Sexual activity: Not Currently  Other Topics Concern  . Not on file  Social History Narrative  . Not on file     Allergies  Allergen Reactions  . Amiodarone Nausea And Vomiting    In combination with colchicine  . Colchicine Diarrhea and Nausea And Vomiting    Pt states she had rnx to medication; severe V/D  . Compazine [Prochlorperazine Edisylate] Other (See Comments)    SEIZURE  . Other Other (See Comments)    Steroids cause uncontrollable coughing     Outpatient Medications Prior to Visit  Medication Sig Dispense Refill  . aspirin EC 325 MG EC tablet Take 1 tablet (325 mg total) by mouth daily.    Marland Kitchen doxycycline (VIBRA-TABS) 100 MG tablet Take 1 tablet (100 mg total) by mouth every 12 (twelve) hours. 10 tablet 0  . furosemide (LASIX) 20 MG tablet Take 1 tablet (20 mg total) by mouth daily as needed for fluid. (Patient taking differently: Take 20 mg by mouth daily. ) 30 tablet 11  . ibuprofen (ADVIL,MOTRIN) 400 MG tablet Take 1 tablet (400 mg total) by mouth every 6 (six) hours as needed for fever, headache or mild pain. 30 tablet 0  . metoprolol tartrate (LOPRESSOR) 25 MG tablet Take 1 tablet (25 mg total) by mouth 2 (two) times daily. 60 tablet 1  . Multiple Vitamin (MULTIVITAMIN WITH MINERALS) TABS tablet Take 1 tablet by mouth daily.    . traMADol (ULTRAM) 50 MG tablet Take 1 tablet (50 mg total) by mouth every 6 (six) hours as needed. (Patient taking differently: Take 50 mg by mouth every 6 (six) hours as needed (pain). ) 1 tablet 0   No facility-administered medications prior to visit.         Review of Systems  Constitutional: Positive for fatigue. Negative for unexpected weight change.  HENT: Negative.        Slight blood out of Right nose   Respiratory: Negative for cough, chest tightness, shortness of breath and  wheezing.   Cardiovascular: Negative for chest pain, palpitations, claudication, leg swelling and near-syncope.  Gastrointestinal: Negative for abdominal distention, abdominal pain, diarrhea, nausea and vomiting.  Endocrine: Negative.   Genitourinary: Negative.  Negative for nocturia.  Musculoskeletal: Negative.  Negative for muscle weakness.  Skin: Negative.   Neurological: Positive for weakness. Negative for dizziness, seizures and syncope.  Hematological: Negative for adenopathy. Does not bruise/bleed easily.  Psychiatric/Behavioral: Negative.        Objective:   Physical Exam  Vitals:   04/17/17 1007  BP: 107/72  Pulse: 66  Resp: 12  Temp: 98.6 F (37 C)  TempSrc: Oral  SpO2: 99%  Weight: 139 lb (63 kg)    Gen: Pleasant, well-nourished, in no distress,  normal affect  ENT: No lesions,  mouth clear,  oropharynx clear, no postnasal drip  Neck: No JVD, no TMG, no carotid bruits  Lungs: No use of accessory muscles, no dullness to percussion, clear without rales or rhonchi  Cardiovascular: RRR, heart sounds normal, no murmur or gallops, no peripheral edema  Abdomen: soft and NT, no HSM,  BS normal  Musculoskeletal: No deformities, no cyanosis or clubbing  Neuro: alert, non focal  Skin: Warm, no lesions or rashes  No results found.       Assessment & Plan:  I personally reviewed all images and lab data in the [facility] system as well as any outside material available during this office visit and agree with the  radiology impressions.   Lobar pneumonia ([place]) Lobar pneumonia Plan  Finish course ABX    Pericardial effusion Post CABG pericardial effusion ,should resolve observation  CHF (congestive heart failure), NYHA class I, chronic, diastolic (HCC) Chronic diastolic CHF ischemic s/p CABG and recent diuresis Cont lasix chk potassium level  Hypokalemia chk bmp   Gabrielle Page was seen today for hospitalization follow-up.  Diagnoses and all orders for this  visit:  Lobar pneumonia ([place])  Hypokalemia -     Basic metabolic panel; Future -     Basic metabolic panel  Coronary artery disease due to lipid rich plaque  Pericardial effusion  Elevated LFTs  CHF (congestive heart failure), NYHA class I, chronic, diastolic ([place])

## 2017-04-18 LAB — BASIC METABOLIC PANEL
BUN/Creatinine Ratio: 26 — ABNORMAL HIGH (ref 9–23)
BUN: 21 mg/dL (ref 6–24)
CHLORIDE: 98 mmol/L (ref 96–106)
CO2: 29 mmol/L (ref 20–29)
Calcium: 9.2 mg/dL (ref 8.7–10.2)
Creatinine, Ser: 0.8 mg/dL (ref 0.57–1.00)
GFR calc Af Amer: 97 mL/min/{1.73_m2} (ref >59)
GFR calc non Af Amer: 84 mL/min/{1.73_m2} (ref >59)
GLUCOSE: 85 mg/dL (ref 65–99)
POTASSIUM: 4.2 mmol/L (ref 3.5–5.2)
SODIUM: 142 mmol/L (ref 134–144)

## 2017-04-24 ENCOUNTER — Other Ambulatory Visit: Payer: Self-pay

## 2017-04-24 ENCOUNTER — Ambulatory Visit (HOSPITAL_COMMUNITY): Payer: Self-pay | Attending: Cardiology

## 2017-04-24 DIAGNOSIS — I313 Pericardial effusion (noninflammatory): Secondary | ICD-10-CM

## 2017-04-24 DIAGNOSIS — I3139 Other pericardial effusion (noninflammatory): Secondary | ICD-10-CM

## 2017-04-24 MED ORDER — TRAMADOL HCL 50 MG PO TABS: 50.0000 mg | ORAL_TABLET | Freq: Two times a day (BID) | ORAL | 0 refills | Status: DC | PRN

## 2017-04-24 NOTE — Progress Notes (Signed)
Still has small amount of fluid around the heart, but unchanged when compare to the previous echo

## 2017-04-29 ENCOUNTER — Telehealth: Payer: Self-pay

## 2017-04-29 NOTE — Telephone Encounter (Signed)
Called patient and gave ECHO results, and she wanted to know is there anything that she needs to do. And now she is having a cough and she wanted to know is the fluid around her heart the reason.  Please advise!

## 2017-04-30 ENCOUNTER — Other Ambulatory Visit: Payer: Self-pay | Admitting: *Deleted

## 2017-04-30 DIAGNOSIS — Z951 Presence of aortocoronary bypass graft: Secondary | ICD-10-CM

## 2017-05-01 ENCOUNTER — Ambulatory Visit
Admission: RE | Admit: 2017-05-01 | Discharge: 2017-05-01 | Disposition: A | Discharge status: Home / Self Care | Payer: Medicaid Other | Source: Ambulatory Visit | Attending: Cardiothoracic Surgery | Admitting: Cardiothoracic Surgery

## 2017-05-01 ENCOUNTER — Ambulatory Visit (INDEPENDENT_AMBULATORY_CARE_PROVIDER_SITE_OTHER): Payer: Self-pay | Admitting: Cardiothoracic Surgery

## 2017-05-01 ENCOUNTER — Ambulatory Visit: Payer: Medicaid Other | Admitting: Physician Assistant

## 2017-05-01 ENCOUNTER — Encounter: Payer: Self-pay | Admitting: Cardiothoracic Surgery

## 2017-05-01 VITALS — BP 130/72 | HR 60 | Temp 98.9°F | Resp 20 | Ht 63.0 in | Wt 132.0 lb

## 2017-05-01 DIAGNOSIS — Z951 Presence of aortocoronary bypass graft: Secondary | ICD-10-CM

## 2017-05-01 NOTE — Progress Notes (Signed)
PCP is Patient, No Pcp Per Referring Provider is Nahser, Wonda Cheng, MD  Chief Complaint  Patient presents with  . Routine Post Op    F/U from surgery with CXR s/p CABG x3    HPI: Schedule I month postop visit after CABG x3 for non-STEMI. The patient required readmission for possible pneumonia, chest discomfort and a small pericardial effusion.  Cardiac enzymes negative.  She was discharged home on oral antibiotics.  She did well until 3 or 4 days ago when she developed a dry cough.  No fever.  She is lost 2-3 pounds.  Surgical incisions continue to heal.  She denies angina or ankle edema.  Chest x-ray performed today shows clear lung fields, stable chronic minimal elevation of the right hemidiaphragm which was present preop.  Sternal wires intact.  Past Medical History:  Diagnosis Date  . Asthma   . Chronic bronchitis ([place])    "usually get it q yr" (11/05/2016)  . GERD (gastroesophageal reflux disease)   . Headache    "weekly" (11/05/2016)  . Heart murmur    "dx'd when I was a child"  . Hepatitis 1991   "when I had my gallbladder out" (11/05/2016)  . History of blood transfusion 1985   S/P childbirth  . History of kidney stones   . NSTEMI (non-ST elevated myocardial infarction) ([place]) 11/05/2016   9/18 PCI/DESx1 to p/mLAD  . Pancreatitis 1991  . Pneumonia    "twice" (11/05/2016)    Past Surgical History:  Procedure Laterality Date  . ABDOMINAL HYSTERECTOMY  1990  . APPENDECTOMY  1992  . BILE DUCT EXPLORATION  1991   "reconstruction"  . CESAREAN SECTION  1985  . CHOLECYSTECTOMY OPEN  1991  . CORONARY ANGIOPLASTY WITH STENT PLACEMENT  11/05/2016  . CORONARY ARTERY BYPASS GRAFT N/A 03/26/2017   Procedure: CORONARY ARTERY BYPASS GRAFTING (CABG) x 3 WITH ENDOSCOPIC HARVESTING OF RIGHT SAPHENOUS VEIN;  Surgeon: Ivin Poot, MD;  Location: [facility];  Service: Open Heart Surgery;  Laterality: N/A;  . CORONARY STENT INTERVENTION N/A 11/05/2016   Procedure: CORONARY STENT  INTERVENTION;  Surgeon: Martinique, Peter M, MD;  Location: [facility];  Service: Cardiovascular;  Laterality: N/A;  . CYSTOSCOPY WITH STENT PLACEMENT  "3 times" (11/05/2016)  . DILATION AND CURETTAGE OF UTERUS  1989  . INTRAVASCULAR ULTRASOUND/IVUS N/A 03/22/2017   Procedure: Intravascular Ultrasound/IVUS;  Surgeon: Martinique, Peter M, MD;  Location: [facility];  Service: Cardiovascular;  Laterality: N/A;  . KNEE ARTHROSCOPY Right 2000s  . LEFT HEART CATH AND CORONARY ANGIOGRAPHY N/A 11/05/2016   Procedure: LEFT HEART CATH AND CORONARY ANGIOGRAPHY;  Surgeon: Martinique, Peter M, MD;  Location: [facility];  Service: Cardiovascular;  Laterality: N/A;  . LEFT HEART CATH AND CORONARY ANGIOGRAPHY N/A 03/22/2017   Procedure: LEFT HEART CATH AND CORONARY ANGIOGRAPHY;  Surgeon: Martinique, Peter M, MD;  Location: [facility];  Service: Cardiovascular;  Laterality: N/A;  . NASAL SINUS SURGERY  ~ 2009   "exposed to mildew; had to have the infectoin drained; cut the outside of my face"  . OVARIAN CYST SURGERY  X 6  . TEE WITHOUT CARDIOVERSION N/A 03/26/2017   Procedure: TRANSESOPHAGEAL ECHOCARDIOGRAM (TEE);  Surgeon: Prescott Gum, [name], MD;  Location: [facility];  Service: Open Heart Surgery;  Laterality: N/A;    Family History  Problem Relation Age of Onset  . Hypertension Father   . Alcohol abuse Father   . Heart attack Father   . Diabetes  Mellitus II Father   . Suicidality Mother     Social History Social History   Tobacco Use  . Smoking status: Never Smoker  . Smokeless tobacco: Never Used  Substance Use Topics  . Alcohol use: Yes    Comment: 11/05/2016 "maybe 1 glass of wine/month"  . Drug use: No    Current Outpatient Medications  Medication Sig Dispense Refill  . aspirin EC 325 MG EC tablet Take 1 tablet (325 mg total) by mouth daily.    . furosemide (LASIX) 20 MG tablet Take 1 tablet (20 mg total) by mouth daily as needed for fluid. (Patient taking differently: Take 20 mg by  mouth daily. ) 30 tablet 11  . ibuprofen (ADVIL,MOTRIN) 400 MG tablet Take 1 tablet (400 mg total) by mouth every 6 (six) hours as needed for fever, headache or mild pain. 30 tablet 0  . metoprolol tartrate (LOPRESSOR) 25 MG tablet Take 1 tablet (25 mg total) by mouth 2 (two) times daily. 60 tablet 1  . Multiple Vitamin (MULTIVITAMIN WITH MINERALS) TABS tablet Take 1 tablet by mouth daily.    . traMADol (ULTRAM) 50 MG tablet Take 1 tablet (50 mg total) by mouth 2 (two) times daily as needed for severe pain (pain). 40 tablet 0   No current facility-administered medications for this visit.     Allergies  Allergen Reactions  . Amiodarone Nausea And Vomiting    In combination with colchicine  . Colchicine Diarrhea and Nausea And Vomiting    Pt states she had rnx to medication; severe V/D  . Compazine [Prochlorperazine Edisylate] Other (See Comments)    SEIZURE  . Other Other (See Comments)    Steroids cause uncontrollable coughing    Review of Systems  No fever or night sweats Slight weight loss postop, expected Surgical incisions clean without drainage No syncope, no headache  BP 130/72   Pulse 60   Temp 98.9 F (37.2 C) (Oral)   Resp 20   Ht 5\' 3"  (1.6 m)   Wt 132 lb (59.9 kg)   SpO2 98% Comment: RA  BMI 23.38 kg/m  Physical Exam      Exam    General- alert and comfortable    Neck- no JVD, no cervical adenopathy palpable, no carotid bruit   Lungs- clear without rales, wheezes   Cor- regular rate and rhythm, no murmur , gallop   Abdomen- soft, non-tender   Extremities - warm, non-tender, minimal edema   Neuro- oriented, appropriate, no focal weakness   Diagnostic Tests: Chest x-ray images personally reviewed showing normal postop changes with minimal basilar atelectasis  Impression: Doing well from cardiac perspective after CABG x3 Fairly violent persistent dry cough the past few days probably viral bronchiolitis-will give short course of oral prednisone.  Do not  feel patient would benefit from antibiotic since cough is nonproductive.  No evidence of pneumonia or pleural effusion on x-ray.  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  She is not yet ready.  She is ready to drive and do normal daily activities at this time.  She will return in 3 weeks to discuss return to work and to assess her progress.  She was given a prescription for prednisone 5-day course 20 mg and Tessalon Perles as needed cough   Len Childs, MD [facility] [phone]

## 2017-05-02 NOTE — Telephone Encounter (Signed)
This small amount of fluid should not cause any issue. In most cases, it will slowlly reabsorbed in the blood vessels over time. Viral disease can potentially cause fluid around the heart

## 2017-05-03 NOTE — Telephone Encounter (Signed)
lmtcb

## 2017-05-07 NOTE — Telephone Encounter (Signed)
Spoke with patient and informed her of what Isaac Laud said and advised her to contact her PCP for further evaluation. She stated she does not have a pcp in this area. And that she thinks something is wrong with her heart. She states she only gets relief from laying down and hears weird noises from her chest-such as cracking and popping. She states she has a upcoming appointment with the Dr that did her last surgery and will speak with him about her concerns.

## 2017-05-07 NOTE — Telephone Encounter (Signed)
Left message for patient to contact office.

## 2017-05-10 NOTE — Telephone Encounter (Signed)
Close encounter 

## 2017-05-22 ENCOUNTER — Other Ambulatory Visit: Payer: Self-pay

## 2017-05-22 ENCOUNTER — Ambulatory Visit (INDEPENDENT_AMBULATORY_CARE_PROVIDER_SITE_OTHER): Payer: Self-pay | Admitting: Cardiothoracic Surgery

## 2017-05-22 ENCOUNTER — Encounter: Payer: Self-pay | Admitting: Cardiothoracic Surgery

## 2017-05-22 VITALS — BP 151/84 | HR 66 | Resp 16 | Ht 63.0 in | Wt 133.0 lb

## 2017-05-22 DIAGNOSIS — Z951 Presence of aortocoronary bypass graft: Secondary | ICD-10-CM

## 2017-05-22 MED ORDER — ATORVASTATIN CALCIUM 10 MG PO TABS: 10.0000 mg | ORAL_TABLET | Freq: Every day | ORAL | 0 refills | Status: DC

## 2017-05-22 MED ORDER — AMLODIPINE BESYLATE 10 MG PO TABS: 5.0000 mg | ORAL_TABLET | Freq: Every day | ORAL | 2 refills | Status: DC

## 2017-05-22 MED ORDER — METOPROLOL TARTRATE 25 MG PO TABS: 25.0000 mg | ORAL_TABLET | Freq: Two times a day (BID) | ORAL | 1 refills | Status: DC

## 2017-05-22 NOTE — Progress Notes (Signed)
PCP is Patient, No Pcp Per Referring Provider is Nahser, Wonda Cheng, MD  Chief Complaint  Patient presents with  . Routine Post Op    3 wk f/u to discuss progress and RTW...some SOB and "popping/crackling of chest" and higher BP last feqw days    HPI: Patient returns for postop visit 8 weeks after urgent CABG x3 At her last visit she had significant bronchitis with coughing and possible allergies.  She was given a short course of prednisone and Tessalon Perles and her symptoms have resolved.  Today she feels somewhat sore in her chest with movement of her upper extremities.  Incision remains healing well.  Chest x-ray today is clear. 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  She still has a 15 pound weight limit for lifting.  She is encouraged to take a 20-minute walk daily.    Patient has stopped her prednisone and Tessalon Perles. She needs a refill for her metoprolol .  She should be on a statin will be started on low-dose Lipitor. She is hypertensive today, feels poorly and will be started on a short course of Norvasc 5 mg daily     Past Medical History:  Diagnosis Date  . Asthma   . Chronic bronchitis ([place])    "usually get it q yr" (11/05/2016)  . GERD (gastroesophageal reflux disease)   . Headache    "weekly" (11/05/2016)  . Heart murmur    "dx'd when I was a child"  . Hepatitis 1991   "when I had my gallbladder out" (11/05/2016)  . History of blood transfusion 1985   S/P childbirth  . History of kidney stones   . NSTEMI (non-ST elevated myocardial infarction) ([place]) 11/05/2016   9/18 PCI/DESx1 to p/mLAD  . Pancreatitis 1991  . Pneumonia    "twice" (11/05/2016)    Past Surgical History:  Procedure Laterality Date  . ABDOMINAL HYSTERECTOMY  1990  . APPENDECTOMY  1992  . BILE DUCT EXPLORATION  1991   "reconstruction"  . CESAREAN SECTION  1985  . CHOLECYSTECTOMY OPEN  1991  . CORONARY ANGIOPLASTY WITH STENT PLACEMENT  11/05/2016  . CORONARY  ARTERY BYPASS GRAFT N/A 03/26/2017   Procedure: CORONARY ARTERY BYPASS GRAFTING (CABG) x 3 WITH ENDOSCOPIC HARVESTING OF RIGHT SAPHENOUS VEIN;  Surgeon: Ivin Poot, MD;  Location: [facility];  Service: Open Heart Surgery;  Laterality: N/A;  . CORONARY STENT INTERVENTION N/A 11/05/2016   Procedure: CORONARY STENT INTERVENTION;  Surgeon: Martinique, Peter M, MD;  Location: [facility];  Service: Cardiovascular;  Laterality: N/A;  . CYSTOSCOPY WITH STENT PLACEMENT  "3 times" (11/05/2016)  . DILATION AND CURETTAGE OF UTERUS  1989  . INTRAVASCULAR ULTRASOUND/IVUS N/A 03/22/2017   Procedure: Intravascular Ultrasound/IVUS;  Surgeon: Martinique, Peter M, MD;  Location: [facility];  Service: Cardiovascular;  Laterality: N/A;  . KNEE ARTHROSCOPY Right 2000s  . LEFT HEART CATH AND CORONARY ANGIOGRAPHY N/A 11/05/2016   Procedure: LEFT HEART CATH AND CORONARY ANGIOGRAPHY;  Surgeon: Martinique, Peter M, MD;  Location: [facility];  Service: Cardiovascular;  Laterality: N/A;  . LEFT HEART CATH AND CORONARY ANGIOGRAPHY N/A 03/22/2017   Procedure: LEFT HEART CATH AND CORONARY ANGIOGRAPHY;  Surgeon: Martinique, Peter M, MD;  Location: [facility];  Service: Cardiovascular;  Laterality: N/A;  . NASAL SINUS SURGERY  ~ 2009   "exposed to mildew; had to have the infectoin drained; cut the outside of my face"  . OVARIAN  CYST SURGERY  X 6  . TEE WITHOUT CARDIOVERSION N/A 03/26/2017   Procedure: TRANSESOPHAGEAL ECHOCARDIOGRAM (TEE);  Surgeon: Prescott Gum, [name], MD;  Location: [facility];  Service: Open Heart Surgery;  Laterality: N/A;    Family History  Problem Relation Age of Onset  . Hypertension Father   . Alcohol abuse Father   . Heart attack Father   . Diabetes Mellitus II Father   . Suicidality Mother     Social History Social History   Tobacco Use  . Smoking status: Never Smoker  . Smokeless tobacco: Never Used  Substance Use Topics  . Alcohol use: Yes    Comment: 11/05/2016 "maybe 1 glass of  wine/month"  . Drug use: No    Current Outpatient Medications  Medication Sig Dispense Refill  . aspirin EC 325 MG EC tablet Take 1 tablet (325 mg total) by mouth daily.    . furosemide (LASIX) 20 MG tablet Take 1 tablet (20 mg total) by mouth daily as needed for fluid. (Patient taking differently: Take 20 mg by mouth daily. ) 30 tablet 11  . ibuprofen (ADVIL,MOTRIN) 400 MG tablet Take 1 tablet (400 mg total) by mouth every 6 (six) hours as needed for fever, headache or mild pain. 30 tablet 0  . metoprolol tartrate (LOPRESSOR) 25 MG tablet Take 1 tablet (25 mg total) by mouth 2 (two) times daily. 60 tablet 1  . Multiple Vitamin (MULTIVITAMIN WITH MINERALS) TABS tablet Take 1 tablet by mouth daily.     No current facility-administered medications for this visit.     Allergies  Allergen Reactions  . Amiodarone Nausea And Vomiting    In combination with colchicine  . Colchicine Diarrhea and Nausea And Vomiting    Pt states she had rnx to medication; severe V/D  . Compazine [Prochlorperazine Edisylate] Other (See Comments)    SEIZURE  . Other Other (See Comments)    Steroids cause uncontrollable coughing    Review of Systems  Symptoms of bronchitis resolved  BP (!) 151/84 (BP Location: Left Arm, Patient Position: Sitting, Cuff Size: Large)   Pulse 66   Resp 16   Ht 5\' 3"  (1.6 m)   Wt 133 lb (60.3 kg)   BMI 23.56 kg/m  Physical Exam      Exam    General- alert and comfortable    Neck- no JVD, no cervical adenopathy palpable, no carotid bruit   Lungs- clear without rales, wheezes Sternum well-healed and stable    Cor- regular rate and rhythm, no murmur , gallop   Abdomen- soft, non-tender   Extremities - warm, non-tender, minimal edema   Neuro- oriented, appropriate, no focal weakness   Diagnostic Tests: Chest x-ray clear  Impression: Slow progress with chest wall discomfort stiffness and soreness after multivessel CABG. Blood pressure high  patient needs statin      Therapy for her CAD-CABG  Plan: Patient is not ready return to work currently and will return for a visit in 4 weeks to assess return to work  She will be started on low-dose Norvasc 5 mg and low-dose Lipitor 10 mg daily.  Continue metoprolol 25 twice daily  Len Childs, MD [facility] [phone]

## 2017-05-23 ENCOUNTER — Other Ambulatory Visit: Payer: Self-pay | Admitting: *Deleted

## 2017-05-23 DIAGNOSIS — I10 Essential (primary) hypertension: Secondary | ICD-10-CM

## 2017-05-23 DIAGNOSIS — E7800 Pure hypercholesterolemia, unspecified: Secondary | ICD-10-CM

## 2017-05-23 MED ORDER — AMLODIPINE BESYLATE 5 MG PO TABS: 5.0000 mg | ORAL_TABLET | Freq: Every day | ORAL | 2 refills | Status: DC

## 2017-05-23 MED ORDER — ATORVASTATIN CALCIUM 10 MG PO TABS: 10.0000 mg | ORAL_TABLET | Freq: Every day | ORAL | 1 refills | Status: DC

## 2017-05-23 MED ORDER — METOPROLOL TARTRATE 25 MG PO TABS: 25.0000 mg | ORAL_TABLET | Freq: Two times a day (BID) | ORAL | 1 refills | Status: DC

## 2017-05-24 ENCOUNTER — Telehealth: Payer: Self-pay | Admitting: Internal Medicine

## 2017-05-24 NOTE — Telephone Encounter (Signed)
I mailed a letter to the patient today and included a CAFA application and letter of support.  Pt needs to apply for CAFA.  I was not able to get IRS info for 2017.  I provided the patient with the IRS number and asked her to request a non filing status letter and reapply for CAFA as soon as possible.

## 2017-06-19 ENCOUNTER — Ambulatory Visit (INDEPENDENT_AMBULATORY_CARE_PROVIDER_SITE_OTHER): Payer: Self-pay | Admitting: Cardiothoracic Surgery

## 2017-06-19 ENCOUNTER — Encounter: Payer: Self-pay | Admitting: Cardiothoracic Surgery

## 2017-06-19 VITALS — BP 126/76 | HR 72 | Resp 20 | Ht 63.0 in | Wt 133.0 lb

## 2017-06-19 DIAGNOSIS — Z951 Presence of aortocoronary bypass graft: Secondary | ICD-10-CM

## 2017-06-19 DIAGNOSIS — I2510 Atherosclerotic heart disease of native coronary artery without angina pectoris: Secondary | ICD-10-CM

## 2017-06-19 DIAGNOSIS — I10 Essential (primary) hypertension: Secondary | ICD-10-CM

## 2017-06-19 MED ORDER — METOPROLOL TARTRATE 25 MG PO TABS: 25.0000 mg | ORAL_TABLET | Freq: Two times a day (BID) | ORAL | 1 refills | Status: DC

## 2017-06-19 NOTE — Progress Notes (Signed)
PCP is Patient, No Pcp Per Referring Provider is Nahser, Wonda Cheng, MD  Chief Complaint  Patient presents with  . Routine Post Op    4 week f/u,  discuss return to work, new medication eval    HPI: Final postop office visit 3 months after multivessel CABG.  Patient's blood pressure now well controlled.  She is walking 30 minutes daily.  Postoperative soreness has resolved.  She is [place] on her own.  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  Her only complaint appears to be some shortness of breath when she bends over to pick up laundry which is probably related to the stiffness in her thorax following the sternotomy.  No shortness of breath with walking.   Past Medical History:  Diagnosis Date  . Asthma   . Chronic bronchitis ([place])    "usually get it q yr" (11/05/2016)  . GERD (gastroesophageal reflux disease)   . Headache    "weekly" (11/05/2016)  . Heart murmur    "dx'd when I was a child"  . Hepatitis 1991   "when I had my gallbladder out" (11/05/2016)  . History of blood transfusion 1985   S/P childbirth  . History of kidney stones   . NSTEMI (non-ST elevated myocardial infarction) ([place]) 11/05/2016   9/18 PCI/DESx1 to p/mLAD  . Pancreatitis 1991  . Pneumonia    "twice" (11/05/2016)    Past Surgical History:  Procedure Laterality Date  . ABDOMINAL HYSTERECTOMY  1990  . APPENDECTOMY  1992  . BILE DUCT EXPLORATION  1991   "reconstruction"  . CESAREAN SECTION  1985  . CHOLECYSTECTOMY OPEN  1991  . CORONARY ANGIOPLASTY WITH STENT PLACEMENT  11/05/2016  . CORONARY ARTERY BYPASS GRAFT N/A 03/26/2017   Procedure: CORONARY ARTERY BYPASS GRAFTING (CABG) x 3 WITH ENDOSCOPIC HARVESTING OF RIGHT SAPHENOUS VEIN;  Surgeon: Ivin Poot, MD;  Location: [facility];  Service: Open Heart Surgery;  Laterality: N/A;  . CORONARY STENT INTERVENTION N/A 11/05/2016   Procedure: CORONARY STENT INTERVENTION;  Surgeon: Martinique, Zyon Grout M, MD;  Location: [facility];   Service: Cardiovascular;  Laterality: N/A;  . CYSTOSCOPY WITH STENT PLACEMENT  "3 times" (11/05/2016)  . DILATION AND CURETTAGE OF UTERUS  1989  . INTRAVASCULAR ULTRASOUND/IVUS N/A 03/22/2017   Procedure: Intravascular Ultrasound/IVUS;  Surgeon: Martinique, Alixandria Friedt M, MD;  Location: [facility];  Service: Cardiovascular;  Laterality: N/A;  . KNEE ARTHROSCOPY Right 2000s  . LEFT HEART CATH AND CORONARY ANGIOGRAPHY N/A 11/05/2016   Procedure: LEFT HEART CATH AND CORONARY ANGIOGRAPHY;  Surgeon: Martinique, Elias Bordner M, MD;  Location: [facility];  Service: Cardiovascular;  Laterality: N/A;  . LEFT HEART CATH AND CORONARY ANGIOGRAPHY N/A 03/22/2017   Procedure: LEFT HEART CATH AND CORONARY ANGIOGRAPHY;  Surgeon: Martinique, Laurabelle Gorczyca M, MD;  Location: [facility];  Service: Cardiovascular;  Laterality: N/A;  . NASAL SINUS SURGERY  ~ 2009   "exposed to mildew; had to have the infectoin drained; cut the outside of my face"  . OVARIAN CYST SURGERY  X 6  . TEE WITHOUT CARDIOVERSION N/A 03/26/2017   Procedure: TRANSESOPHAGEAL ECHOCARDIOGRAM (TEE);  Surgeon: Prescott Gum, [name], MD;  Location: [facility];  Service: Open Heart Surgery;  Laterality: N/A;    Family History  Problem Relation Age of Onset  . Hypertension Father   . Alcohol abuse Father   . Heart attack Father   . Diabetes Mellitus II Father   . Suicidality Mother  Social History Social History   Tobacco Use  . Smoking status: Never Smoker  . Smokeless tobacco: Never Used  Substance Use Topics  . Alcohol use: Yes    Comment: 11/05/2016 "maybe 1 glass of wine/month"  . Drug use: No    Current Outpatient Medications  Medication Sig Dispense Refill  . amLODipine (NORVASC) 5 MG tablet Take 1 tablet (5 mg total) by mouth daily. 30 tablet 2  . aspirin EC 325 MG EC tablet Take 1 tablet (325 mg total) by mouth daily.    Marland Kitchen atorvastatin (LIPITOR) 10 MG tablet Take 1 tablet (10 mg total) by mouth daily at 6 PM. 30 tablet 1  . furosemide (LASIX) 20  MG tablet Take 1 tablet (20 mg total) by mouth daily as needed for fluid. (Patient taking differently: Take 20 mg by mouth daily. ) 30 tablet 11  . ibuprofen (ADVIL,MOTRIN) 400 MG tablet Take 1 tablet (400 mg total) by mouth every 6 (six) hours as needed for fever, headache or mild pain. 30 tablet 0  . metoprolol tartrate (LOPRESSOR) 25 MG tablet Take 1 tablet (25 mg total) by mouth 2 (two) times daily. 60 tablet 1  . Multiple Vitamin (MULTIVITAMIN WITH MINERALS) TABS tablet Take 1 tablet by mouth daily.     No current facility-administered medications for this visit.     Allergies  Allergen Reactions  . Amiodarone Nausea And Vomiting    In combination with colchicine  . Colchicine Diarrhea and Nausea And Vomiting    Pt states she had rnx to medication; severe V/D  . Compazine [Prochlorperazine Edisylate] Other (See Comments)    SEIZURE  . Other Other (See Comments)    Steroids cause uncontrollable coughing    Review of Systems weight stable No fever No edema Sternal incision without drainage or clicking sensation  BP 126/76   Pulse 72   Resp 20   Ht 5\' 3"  (1.6 m)   Wt 133 lb (60.3 kg)   BMI 23.56 kg/m  Physical Exam      Exam    General- alert and comfortable    Neck- no JVD, no cervical adenopathy palpable, no carotid bruit   Lungs- clear without rales, wheezes   Cor- regular rate and rhythm, no murmur , gallop   Abdomen- soft, non-tender   Extremities - warm, non-tender, minimal edema   Neuro- oriented, appropriate, no focal weakness   Diagnostic Tests: None   Impression: Excellent recovery after multivessel CABG Continue current medications, heart healthy diet, heart healthy lifestyle Plan: Return as needed.  Importance of heart healthy diet and heart healthy lifestyle compliance of medications stressed to patient.   Len Childs, MD [facility] [phone]

## 2017-08-13 ENCOUNTER — Other Ambulatory Visit: Payer: Self-pay | Admitting: Cardiothoracic Surgery

## 2017-08-13 DIAGNOSIS — E7800 Pure hypercholesterolemia, unspecified: Secondary | ICD-10-CM

## 2017-09-17 DIAGNOSIS — I447 Left bundle-branch block, unspecified: Secondary | ICD-10-CM

## 2017-09-17 DIAGNOSIS — R079 Chest pain, unspecified: Secondary | ICD-10-CM

## 2017-09-17 DIAGNOSIS — I10 Essential (primary) hypertension: Secondary | ICD-10-CM

## 2017-09-17 DIAGNOSIS — I2510 Atherosclerotic heart disease of native coronary artery without angina pectoris: Secondary | ICD-10-CM

## 2017-09-17 DIAGNOSIS — R55 Syncope and collapse: Secondary | ICD-10-CM

## 2017-11-25 DIAGNOSIS — I2510 Atherosclerotic heart disease of native coronary artery without angina pectoris: Secondary | ICD-10-CM

## 2017-11-25 DIAGNOSIS — R0602 Shortness of breath: Secondary | ICD-10-CM

## 2017-11-25 DIAGNOSIS — R079 Chest pain, unspecified: Secondary | ICD-10-CM

## 2017-11-25 DIAGNOSIS — I10 Essential (primary) hypertension: Secondary | ICD-10-CM

## 2017-11-25 DIAGNOSIS — R109 Unspecified abdominal pain: Secondary | ICD-10-CM

## 2017-11-25 DIAGNOSIS — R55 Syncope and collapse: Secondary | ICD-10-CM

## 2017-11-25 DIAGNOSIS — I447 Left bundle-branch block, unspecified: Secondary | ICD-10-CM

## 2018-07-18 DIAGNOSIS — Z736 Limitation of activities due to disability: Secondary | ICD-10-CM

## 2018-10-11 NOTE — Progress Notes (Signed)
Cardiology Office Note:    Date:  10/13/2018   ID:  Gabrielle Page, DOB 04/13/1960, MRN [id number]  PCP:  Jaclynn Major, NP  Cardiologist:  Shirlee More, MD   Referring MD: Jaclynn Major, NP  ASSESSMENT:    1. SOB (shortness of breath)   2. Coronary artery disease involving native coronary artery of native heart with angina pectoris ([place])   3. Dyslipidemia   4. Pericardial effusion   5. LBBB (left bundle branch block)   6. Chronic diastolic heart failure (HCC)    PLAN:    In order of problems listed above:  1. She has signs and symptoms of diastolic heart failure.  When asked her to allow [place] to recheck her echocardiogram proBNP level and then make decisions regarding medical treatment.  At this time is not fluid overloaded I would not intensify her diuretic. 2. Stable after CABG continue aspirin statin 3. Stable lipids are at target continue high intensity statin 4. Resolved on left echocardiogram 5. Persistent left bundle branch block pattern.  If EF is severely reduced to need to consider CRT therapy  Next appointment 4 weeks   Medication Adjustments/Labs and Tests Ordered: Current medicines are reviewed at length with the patient today.  Concerns regarding medicines are outlined above.  No orders of the defined types were placed in this encounter.  No orders of the defined types were placed in this encounter.    Chief Complaint  Patient presents with  . Follow-up  . Coronary Artery Disease  . Congestive Heart Failure    History of Present Illness:    Gabrielle Page is a 58 y.o. female with CAD with NonSTEMI with PCI and DES of LAD September 2018 and CABG 03/26/2017 with LIMA to LAD, SVG to Diag and SVG to OM and  post operative PAF and pericardial effusionwho is being seen today for the evaluation of chest pain at the request of Jaclynn Major, NP.  Echo 04/24/2017: Study Conclusions   - Left ventricle: The cavity size was normal.  Wall thickness was   increased in a pattern of mild LVH. Systolic function was   vigorous. The estimated ejection fraction was in the range of 65%   to 70%. Wall motion was normal; there were no regional wall   motion abnormalities. Left ventricular diastolic function   parameters were normal for the patient&'s age. - Left atrium: The atrium was mildly to moderately dilated. - Pulmonary arteries: Systolic pressure was mildly increased. PA   peak pressure: 32 mm Hg (S). - Pericardium, extracardiac: A small pericardial effusion was   identified circumferential to the heart. There was no evidence of   hemodynamic compromise.  She is here and tells me he was diagnosed with congestive heart failure.  She had a chest x-ray that was unremarkable looking back at records August 2019 showed normal ejection fraction, left bundle branch block and pseudo-normal diastolic function and then in October had a proBNP level of 1300.  She has market exercise intolerance short of breath with any activities even ADLs but she persevere she tries to walk 3 times a week and do gardening work.  She is particularly bothered at bedtime sleeps on a wedge and several pillows because of shortness of breath she has no angina palpitation or syncope.  The symptoms have slowly progressed and accelerated.  Her edema is improved with the diuretic but her shortness of breath is unchanged.. In the last year she had a GI bleed and required transfusion.  Her  aspirin dose had been reduced prior to this visit 81 mg daily.  Most recent labs in October 2019 reviewed from [facility] records.  Hemoglobin 9.6 creatinine 0.7 potassium 4.0 troponin at that time was undetectable EKG showed stable left bundle branch block note her echocardiogram that was done last year showed no persistent pericardial effusion Past Medical History:  Diagnosis Date  . Asthma   . Chronic bronchitis ([place])    "usually get it q yr" (11/05/2016)  . GERD  (gastroesophageal reflux disease)   . Headache    "weekly" (11/05/2016)  . Heart murmur    "dx'd when I was a child"  . Hepatitis 1991   "when I had my gallbladder out" (11/05/2016)  . History of blood transfusion 1985   S/P childbirth  . History of kidney stones   . NSTEMI (non-ST elevated myocardial infarction) ([place]) 11/05/2016   9/18 PCI/DESx1 to p/mLAD  . Pancreatitis 1991  . Pneumonia    "twice" (11/05/2016)    Past Surgical History:  Procedure Laterality Date  . ABDOMINAL HYSTERECTOMY  1990  . APPENDECTOMY  1992  . BILE DUCT EXPLORATION  1991   "reconstruction"  . CESAREAN SECTION  1985  . CHOLECYSTECTOMY OPEN  1991  . CORONARY ANGIOPLASTY WITH STENT PLACEMENT  11/05/2016  . CORONARY ARTERY BYPASS GRAFT N/A 03/26/2017   Procedure: CORONARY ARTERY BYPASS GRAFTING (CABG) x 3 WITH ENDOSCOPIC HARVESTING OF RIGHT SAPHENOUS VEIN;  Surgeon: Ivin Poot, MD;  Location: [facility];  Service: Open Heart Surgery;  Laterality: N/A;  . CORONARY STENT INTERVENTION N/A 11/05/2016   Procedure: CORONARY STENT INTERVENTION;  Surgeon: Martinique, Peter M, MD;  Location: [facility];  Service: Cardiovascular;  Laterality: N/A;  . CYSTOSCOPY WITH STENT PLACEMENT  "3 times" (11/05/2016)  . DILATION AND CURETTAGE OF UTERUS  1989  . INTRAVASCULAR ULTRASOUND/IVUS N/A 03/22/2017   Procedure: Intravascular Ultrasound/IVUS;  Surgeon: Martinique, Peter M, MD;  Location: [facility] [facility];  Service: Cardiovascular;  Laterality: N/A;  . KNEE ARTHROSCOPY Right 2000s  . LEFT HEART CATH AND CORONARY ANGIOGRAPHY N/A 11/05/2016   Procedure: LEFT HEART CATH AND CORONARY ANGIOGRAPHY;  Surgeon: Martinique, Peter M, MD;  Location: [facility];  Service: Cardiovascular;  Laterality: N/A;  . LEFT HEART CATH AND CORONARY ANGIOGRAPHY N/A 03/22/2017   Procedure: LEFT HEART CATH AND CORONARY ANGIOGRAPHY;  Surgeon: Martinique, Peter M, MD;  Location: [place] [facility];  Service: Cardiovascular;  Laterality: N/A;  . NASAL SINUS  SURGERY  ~ 2009   "exposed to mildew; had to have the infectoin drained; cut the outside of my face"  . OVARIAN CYST SURGERY  X 6  . TEE WITHOUT CARDIOVERSION N/A 03/26/2017   Procedure: TRANSESOPHAGEAL ECHOCARDIOGRAM (TEE);  Surgeon: Prescott Gum, [name], MD;  Location: [place];  Service: Open Heart Surgery;  Laterality: N/A;    Current Medications: Current Meds  Medication Sig  . albuterol (VENTOLIN HFA) 108 (90 Base) MCG/ACT inhaler Inhale 2 puffs into the lungs every 6 (six) hours as needed for wheezing or shortness of breath.  Marland Kitchen amLODipine (NORVASC) 5 MG tablet Take 1 tablet (5 mg total) by mouth daily.  Marland Kitchen aspirin EC 325 MG EC tablet Take 1 tablet (325 mg total) by mouth daily.  Marland Kitchen atorvastatin (LIPITOR) 10 MG tablet Take 1 tablet (10 mg total) by mouth daily at 6 PM.  . benzonatate (TESSALON) 100 MG capsule Take 1 capsule by mouth 3 (three) times daily.  . furosemide (LASIX) 20 MG tablet  Take 1 tablet (20 mg total) by mouth daily as needed for fluid. (Patient taking differently: Take 20 mg by mouth daily. )  . ibuprofen (ADVIL,MOTRIN) 400 MG tablet Take 1 tablet (400 mg total) by mouth every 6 (six) hours as needed for fever, headache or mild pain.  . metoprolol tartrate (LOPRESSOR) 25 MG tablet Take 1 tablet (25 mg total) by mouth 2 (two) times daily.  . Multiple Vitamin (MULTIVITAMIN WITH MINERALS) TABS tablet Take 1 tablet by mouth daily.  . Multiple Vitamins-Minerals (CENTRUM VITAMINTS PO) Take by mouth.  . nitroGLYCERIN (NITROSTAT) 0.4 MG SL tablet Place 0.4 mg under the tongue every 5 (five) minutes as needed for chest pain.  . Omega-3 Fatty Acids (FISH OIL) 1000 MG CAPS Take 1 capsule by mouth daily.  . Potassium Bicarb-Citric Acid 20 MEQ TBEF Take by mouth.  . traZODone (DESYREL) 50 MG tablet Take 1 tablet by mouth at bedtime.     Allergies:   Amiodarone, Colchicine, Compazine [prochlorperazine edisylate], and Other   Social History   Socioeconomic History  . Marital status:  Divorced    Spouse name: Not on file  . Number of children: Not on file  . Years of education: Not on file  . Highest education level: Not on file  Occupational History  . Not on file  Social Needs  . Financial resource strain: Not on file  . Food insecurity    Worry: Not on file    Inability: Not on file  . Transportation needs    Medical: Not on file    Non-medical: Not on file  Tobacco Use  . Smoking status: Never Smoker  . Smokeless tobacco: Never Used  Substance and Sexual Activity  . Alcohol use: Yes    Comment: 11/05/2016 "maybe 1 glass of wine/month"  . Drug use: No  . Sexual activity: Not Currently  Lifestyle  . Physical activity    Days per week: Not on file    Minutes per session: Not on file  . Stress: Not on file  Relationships  . Social Herbalist on phone: Not on file    Gets together: Not on file    Attends religious service: Not on file    Active member of club or organization: Not on file    Attends meetings of clubs or organizations: Not on file    Relationship status: Not on file  Other Topics Concern  . Not on file  Social History Narrative  . Not on file     Family History: The patient's family history includes Alcohol abuse in her father; Diabetes Mellitus II in her father; Heart attack in her father; Hypertension in her father; Suicidality in her mother.  ROS:   ROS Please see the history of present illness.     All other systems reviewed and are negative.  EKGs/Labs/Other Studies Reviewed:    The following studies were reviewed today:   EKG:  EKG is  ordered today.  The ekg ordered today is personally reviewed and demonstrates sinus rhythm left bundle branch block  Recent Labs: No results found for requested labs within last 8760 hours.  Recent Lipid Panel    Component Value Date/Time   CHOL 75 04/08/2017 0440   TRIG 107 04/08/2017 0440   HDL 20 (L) 04/08/2017 0440   CHOLHDL 3.8 04/08/2017 0440   VLDL 21 04/08/2017  0440   LDLCALC 34 04/08/2017 0440    Physical Exam:    VS:  BP  106/68   Pulse 63   Ht 5\' 3"  (1.6 m)   Wt 124 lb (56.2 kg)   SpO2 99%   BMI 21.97 kg/m     Wt Readings from Last 3 Encounters:  10/13/18 124 lb (56.2 kg)  06/19/17 133 lb (60.3 kg)  05/22/17 133 lb (60.3 kg)     GEN:  Well nourished, well developed in no acute distress HEENT: Normal NECK: No JVD; No carotid bruits LYMPHATICS: No lymphadenopathy CARDIAC: S2 paradoxical RRR, no murmurs, rubs, gallops RESPIRATORY:  Clear to auscultation without rales, wheezing or rhonchi  ABDOMEN: Soft, non-tender, non-distended MUSCULOSKELETAL:  No edema; No deformity  SKIN: Warm and dry NEUROLOGIC:  Alert and oriented x 3 PSYCHIATRIC:  Normal affect     Signed, Shirlee More, MD  10/13/2018 3:10 PM    [facility]

## 2018-10-13 ENCOUNTER — Other Ambulatory Visit: Payer: Self-pay

## 2018-10-13 ENCOUNTER — Ambulatory Visit (INDEPENDENT_AMBULATORY_CARE_PROVIDER_SITE_OTHER): Payer: Self-pay | Admitting: Cardiology

## 2018-10-13 ENCOUNTER — Encounter: Payer: Self-pay | Admitting: Cardiology

## 2018-10-13 VITALS — BP 106/68 | HR 63 | Ht 63.0 in | Wt 124.0 lb

## 2018-10-13 DIAGNOSIS — E785 Hyperlipidemia, unspecified: Secondary | ICD-10-CM

## 2018-10-13 DIAGNOSIS — I313 Pericardial effusion (noninflammatory): Secondary | ICD-10-CM

## 2018-10-13 DIAGNOSIS — I447 Left bundle-branch block, unspecified: Secondary | ICD-10-CM

## 2018-10-13 DIAGNOSIS — I5032 Chronic diastolic (congestive) heart failure: Secondary | ICD-10-CM

## 2018-10-13 DIAGNOSIS — I3139 Other pericardial effusion (noninflammatory): Secondary | ICD-10-CM

## 2018-10-13 DIAGNOSIS — R0602 Shortness of breath: Secondary | ICD-10-CM

## 2018-10-13 DIAGNOSIS — I25119 Atherosclerotic heart disease of native coronary artery with unspecified angina pectoris: Secondary | ICD-10-CM

## 2018-10-13 NOTE — Patient Instructions (Addendum)
Medication Instructions:  Your physician recommends that you continue on your current medications as directed. Please refer to the Current Medication list given to you today.  If you need a refill on your cardiac medications before your next appointment, please call your pharmacy.   Lab work: Your physician recommends that you return for lab work today: BMP, ProBNP.   If you have labs (blood work) drawn today and your tests are completely normal, you will receive your results only by: Marland Kitchen MyChart Message (if you have MyChart) OR . A paper copy in the mail If you have any lab test that is abnormal or we need to change your treatment, we will call you to review the results.  Testing/Procedures: You had an EKG today.   Your physician has requested that you have an echocardiogram. Echocardiography is a painless test that uses sound waves to create images of your heart. It provides your doctor with information about the size and shape of your heart and how well your heart's chambers and valves are working. This procedure takes approximately one hour. There are no restrictions for this procedure.  Follow-Up: At [facility], you and your health needs are our priority.  As part of our continuing mission to provide you with exceptional heart care, we have created designated Provider Care Teams.  These Care Teams include your primary Cardiologist (physician) and Advanced Practice Providers (APPs -  Physician Assistants and Nurse Practitioners) who all work together to provide you with the care you need, when you need it. You will need a follow up appointment in 4 weeks.    Echocardiogram An echocardiogram is a procedure that uses painless sound waves (ultrasound) to produce an image of the heart. Images from an echocardiogram can provide important information about:  Signs of coronary artery disease (CAD).  Aneurysm detection. An aneurysm is a weak or damaged part of an artery wall that bulges out  from the normal force of blood pumping through the body.  Heart size and shape. Changes in the size or shape of the heart can be associated with certain conditions, including heart failure, aneurysm, and CAD.  Heart muscle function.  Heart valve function.  Signs of a past heart attack.  Fluid buildup around the heart.  Thickening of the heart muscle.  A tumor or infectious growth around the heart valves. Tell a health care provider about:  Any allergies you have.  All medicines you are taking, including vitamins, herbs, eye drops, creams, and over-the-counter medicines.  Any blood disorders you have.  Any surgeries you have had.  Any medical conditions you have.  Whether you are pregnant or may be pregnant. What are the risks? Generally, this is a safe procedure. However, problems may occur, including:  Allergic reaction to dye (contrast) that may be used during the procedure. What happens before the procedure? No specific preparation is needed. You may eat and drink normally. What happens during the procedure?   An IV tube may be inserted into one of your veins.  You may receive contrast through this tube. A contrast is an injection that improves the quality of the pictures from your heart.  A gel will be applied to your chest.  A wand-like tool (transducer) will be moved over your chest. The gel will help to transmit the sound waves from the transducer.  The sound waves will harmlessly bounce off of your heart to allow the heart images to be captured in real-time motion. The images will be recorded  on a computer. The procedure may vary among health care providers and hospitals. What happens after the procedure?  You may return to your normal, everyday life, including diet, activities, and medicines, unless your health care provider tells you not to do that. Summary  An echocardiogram is a procedure that uses painless sound waves (ultrasound) to produce an image  of the heart.  Images from an echocardiogram can provide important information about the size and shape of your heart, heart muscle function, heart valve function, and fluid buildup around your heart.  You do not need to do anything to prepare before this procedure. You may eat and drink normally.  After the echocardiogram is completed, you may return to your normal, everyday life, unless your health care provider tells you not to do that. This information is not intended to replace advice given to you by your health care provider. Make sure you discuss any questions you have with your health care provider. Document Released: 01/27/2000 Document Revised: 05/22/2018 Document Reviewed: 03/03/2016 Elsevier Patient Education  2020 [employer].

## 2018-10-14 ENCOUNTER — Telehealth: Payer: Self-pay

## 2018-10-14 ENCOUNTER — Ambulatory Visit (HOSPITAL_BASED_OUTPATIENT_CLINIC_OR_DEPARTMENT_OTHER)
Admission: RE | Admit: 2018-10-14 | Discharge: 2018-10-14 | Disposition: A | Discharge status: Home / Self Care | Payer: Self-pay | Source: Ambulatory Visit | Attending: Cardiology | Admitting: Cardiology

## 2018-10-14 DIAGNOSIS — I5032 Chronic diastolic (congestive) heart failure: Secondary | ICD-10-CM | POA: Insufficient documentation

## 2018-10-14 DIAGNOSIS — R0602 Shortness of breath: Secondary | ICD-10-CM | POA: Insufficient documentation

## 2018-10-14 DIAGNOSIS — I447 Left bundle-branch block, unspecified: Secondary | ICD-10-CM | POA: Insufficient documentation

## 2018-10-14 LAB — PRO B NATRIURETIC PEPTIDE: NT-Pro BNP: 302 pg/mL — ABNORMAL HIGH (ref 0–287)

## 2018-10-14 LAB — BASIC METABOLIC PANEL
BUN/Creatinine Ratio: 31 — ABNORMAL HIGH (ref 9–23)
BUN: 31 mg/dL — ABNORMAL HIGH (ref 6–24)
CO2: 25 mmol/L (ref 20–29)
Calcium: 10.1 mg/dL (ref 8.7–10.2)
Chloride: 101 mmol/L (ref 96–106)
Creatinine, Ser: 1 mg/dL (ref 0.57–1.00)
GFR calc Af Amer: 73 mL/min/{1.73_m2} (ref >59)
GFR calc non Af Amer: 63 mL/min/{1.73_m2} (ref >59)
Glucose: 93 mg/dL (ref 65–99)
Potassium: 4.9 mmol/L (ref 3.5–5.2)
Sodium: 141 mmol/L (ref 134–144)

## 2018-10-14 MED ORDER — PERFLUTREN LIPID MICROSPHERE
1.0000 mL | INTRAVENOUS | Status: AC | PRN
  Administered 2018-10-14: 16:00:00 3 mL via INTRAVENOUS
  Filled 2018-10-14: qty 10

## 2018-10-14 NOTE — Telephone Encounter (Signed)
Left message to return call. Will continue efforts.

## 2018-10-14 NOTE — Progress Notes (Signed)
  Echocardiogram 2D Echocardiogram has been performed.  Gabrielle Page 10/14/2018, 3:37 PM

## 2018-10-14 NOTE — Telephone Encounter (Signed)
-----   Message from Richardo Priest, MD sent at 10/14/2018  7:38 AM EDT ----- Normal or stable result  No changes

## 2018-10-15 ENCOUNTER — Telehealth: Payer: Self-pay | Admitting: Cardiology

## 2018-10-15 ENCOUNTER — Telehealth: Payer: Self-pay

## 2018-10-15 NOTE — Telephone Encounter (Signed)
Patient called returning your call.  Please call patient back.

## 2018-10-15 NOTE — Telephone Encounter (Signed)
Left message to return call.  Will continue efforts.

## 2018-10-15 NOTE — Telephone Encounter (Signed)
-----   Message from Richardo Priest, MD sent at 10/14/2018  6:03 PM EDT ----- Normal or stable result  The test does show findings of congestive heart failure not due to weak heart muscle due to stiffness and issues with filling.  It is important that we avoid fluid overload and like her to increase her furosemide to 40 mg once daily.  I would also like to start her on Spironolactone 25 mg daily and in about 1 week to repeat BMP and proBNP she can do it in the [place] office.  Lets have her do a follow-up visit before I come back with [facility] in the office at [facility].

## 2018-10-16 ENCOUNTER — Telehealth: Payer: Self-pay | Admitting: *Deleted

## 2018-10-16 DIAGNOSIS — I5032 Chronic diastolic (congestive) heart failure: Secondary | ICD-10-CM

## 2018-10-16 MED ORDER — FUROSEMIDE 40 MG PO TABS: 40.0000 mg | ORAL_TABLET | Freq: Every day | ORAL | 3 refills | Status: DC

## 2018-10-16 MED ORDER — SPIRONOLACTONE 25 MG PO TABS: 25.0000 mg | ORAL_TABLET | Freq: Every day | ORAL | 3 refills | Status: DC

## 2018-10-16 NOTE — Telephone Encounter (Signed)
Patient informed of echocardiogram results and advised to increase furosemide from 20 mg to 40 mg daily and start taking spironolactone 25 mg daily. Patient is agreeable and medications sent to [facility] in [place] as requested. Patient will go to the [place] office for repeat lab work next Friday, 10/24/2018, no appointment needed. No need to fast beforehand. She has also been scheduled for a follow up appointment with Laurann Montana, NP on Monday, 10/27/2018, at 10:15 am in the [facility] office per Dr. Bettina Gavia. Patient verbalized understanding. No further questions.

## 2018-10-16 NOTE — Telephone Encounter (Signed)
-----   Message from Richardo Priest, MD sent at 10/14/2018  6:03 PM EDT ----- Normal or stable result  The test does show findings of congestive heart failure not due to weak heart muscle due to stiffness and issues with filling.  It is important that we avoid fluid overload and like her to increase her furosemide to 40 mg once daily.  I would also like to start her on Spironolactone 25 mg daily and in about 1 week to repeat BMP and proBNP she can do it in the [place] office.  Lets have her do a follow-up visit before I come back with [facility] in the office at [facility].

## 2018-10-16 NOTE — Telephone Encounter (Signed)
Left message to return call. Will continue efforts.

## 2018-10-16 NOTE — Telephone Encounter (Signed)
Patient returned call. Please see most recent encounter.

## 2018-10-27 ENCOUNTER — Ambulatory Visit: Payer: Medicaid Other | Admitting: Family

## 2018-11-12 ENCOUNTER — Telehealth: Payer: Self-pay | Admitting: Cardiology

## 2018-11-12 NOTE — Telephone Encounter (Signed)
I think it be fine to come tomorrow provided its been 3 days since her last symptoms

## 2018-11-12 NOTE — Telephone Encounter (Signed)
Please advise. Thanks.  

## 2018-11-12 NOTE — Telephone Encounter (Signed)
Patient informed to keep her follow up appointment with Dr. Bettina Gavia tomorrow morning, 11/13/2018, at 11:20 am in the [facility] office as scheduled. Patient will arrive 15 minutes early. No further questions.

## 2018-11-12 NOTE — Telephone Encounter (Signed)
New Message  Patient is calling in to see if she needs to reschedule or if she is good to attend visit. Patient was diagnosed with CoVID-19 at the beginning of September from getting a positive test. Patient states the health department released her on 10/31/18. She states that she went to her GI doctor on yesterday and they refused her service and told her that she had to go have another COVID-19 test done. Patient wants to know does she need to reschedule her appointment or because she was released on 10/31/18 will she be fine to come to appointment on tomorrow. Please give patient a call back to confirm.

## 2018-11-13 ENCOUNTER — Ambulatory Visit (HOSPITAL_BASED_OUTPATIENT_CLINIC_OR_DEPARTMENT_OTHER)
Admission: RE | Admit: 2018-11-13 | Discharge: 2018-11-13 | Disposition: A | Discharge status: Home / Self Care | Payer: Medicaid Other | Source: Ambulatory Visit | Attending: Cardiology | Admitting: Cardiology

## 2018-11-13 ENCOUNTER — Other Ambulatory Visit: Payer: Self-pay

## 2018-11-13 ENCOUNTER — Ambulatory Visit (INDEPENDENT_AMBULATORY_CARE_PROVIDER_SITE_OTHER): Payer: Self-pay | Admitting: Cardiology

## 2018-11-13 ENCOUNTER — Encounter: Payer: Self-pay | Admitting: Cardiology

## 2018-11-13 VITALS — BP 96/62 | HR 60 | Temp 97.6°F | Ht 63.0 in | Wt 119.1 lb

## 2018-11-13 DIAGNOSIS — I313 Pericardial effusion (noninflammatory): Secondary | ICD-10-CM | POA: Insufficient documentation

## 2018-11-13 DIAGNOSIS — Z01810 Encounter for preprocedural cardiovascular examination: Secondary | ICD-10-CM

## 2018-11-13 DIAGNOSIS — I447 Left bundle-branch block, unspecified: Secondary | ICD-10-CM

## 2018-11-13 DIAGNOSIS — Z01812 Encounter for preprocedural laboratory examination: Secondary | ICD-10-CM

## 2018-11-13 DIAGNOSIS — I5032 Chronic diastolic (congestive) heart failure: Secondary | ICD-10-CM

## 2018-11-13 DIAGNOSIS — I3139 Other pericardial effusion (noninflammatory): Secondary | ICD-10-CM

## 2018-11-13 DIAGNOSIS — E876 Hypokalemia: Secondary | ICD-10-CM

## 2018-11-13 DIAGNOSIS — Z951 Presence of aortocoronary bypass graft: Secondary | ICD-10-CM

## 2018-11-13 MED ORDER — SPIRONOLACTONE 25 MG PO TABS: 12.5000 mg | ORAL_TABLET | Freq: Every day | ORAL | 3 refills | Status: DC

## 2018-11-13 MED ORDER — FUROSEMIDE 40 MG PO TABS: 20.0000 mg | ORAL_TABLET | Freq: Every day | ORAL | 3 refills | Status: DC

## 2018-11-13 NOTE — Patient Instructions (Addendum)
Medication Instructions:  Your physician has recommended you make the following change in your medication:   STOP amlodipine (norvasc)   DECREASE furosemide (lasix) 40 mg: Take 0.5 tablet (20 mg) daily DECREASE spironolactone (aldactone) 25 mg: Take 0.5 tablet (12.5 mg) daily  If you need a refill on your cardiac medications before your next appointment, please call your pharmacy.   Lab work: Your physician recommends that you return for lab work today: CBC, BMP, ProBNP.   If you have labs (blood work) drawn today and your tests are completely normal, you will receive your results only by: Marland Kitchen MyChart Message (if you have MyChart) OR . A paper copy in the mail If you have any lab test that is abnormal or we need to change your treatment, we will call you to review the results.  Testing/Procedures: You had an EKG today.   You will go to the [facility] on Friday, 11/21/2018, at 12:30 pm for pre-procedure COVID testing. Please arrive at [place], [place] 15 minutes early. Go to building overhang for pre-procedural testing. Do NOT go to the tent or tent line.      [place] [facility] [facility] [address] [place] [address] Dept: [phone] Loc: [place]  11/13/2018  You are scheduled for a Cardiac Catheterization on Monday, October 12 with Dr. [name].  1. Please arrive at the [facility] (Main Entrance A) at [facility]: [address] [place], [place] [address] at 7:00 AM (This time is two hours before your procedure to ensure your preparation). Free valet parking service is available.   Special note: Every effort is made to have your procedure done on time. Please understand that emergencies sometimes delay scheduled procedures.  2. Diet: Do not eat solid foods after midnight.  The patient may have clear liquids until 5am upon the  day of the procedure.  3. Labs: None needed.   4. Medication instructions in preparation for your procedure:   Contrast Allergy: No  Do NOT take potassium bicarbonate-citric acid, furosemide, or spironolactone the day of your catheterization until after the procedure is completed.    On the morning of your procedure, take your Aspirin and any morning medicines NOT listed above.  You may use sips of water.  5. Plan for one night stay--bring personal belongings. 6. Bring a current list of your medications and current insurance cards. 7. You MUST have a responsible person to drive you home. 8. Someone MUST be with you the first 24 hours after you arrive home or your discharge will be delayed. 9. Please wear clothes that are easy to get on and off and wear slip-on shoes.  Thank you for allowing [place] to care for you!   -- [place] [facility]   Follow-Up: At [facility], you and your health needs are our priority.  As part of our continuing mission to provide you with exceptional heart care, we have created designated Provider Care Teams.  These Care Teams include your primary Cardiologist (physician) and Advanced Practice Providers (APPs -  Physician Assistants and Nurse Practitioners) who all work together to provide you with the care you need, when you need it. You will need a follow up appointment in 4 weeks.

## 2018-11-13 NOTE — H&P (View-Only) (Signed)
Cardiology Office Note:    Date:  11/13/2018   ID:  Gabrielle Page, DOB [birth year], MRN [id number]  PCP:  Gabrielle Major, NP  Cardiologist:  Gabrielle More, MD    Referring MD: Gabrielle Major, NP    ASSESSMENT:    No diagnosis found. PLAN:    In order of problems listed above:  1. Heart failure, clinically she has diastolic heart failure edema last visit typical symptoms and pseudo-normal diastolic function.  Unfortunately she has not responded to medical treatment I feel she would benefit from a full right heart catheterization specially with her previous pericardial effusion after bypass surgery although clinically she has no markers of constriction she will stop her calcium channel blocker reduce her diuretic dosage and check full labs including proBNP renal function and potassium prior to left and right heart catheterization 2. CAD status post CABG with atypical chest pain she undergo left heart catheterization coronary angiography. 3. Left bundle branch block stable EKG pattern she does not have dyssynchrony and her QRS is relatively narrow at 148 ms. 4. Hyperlipidemia continue her statin,    Next appointment: 4 weeks after left and right heart catheterization   Medication Adjustments/Labs and Tests Ordered: Current medicines are reviewed at length with the patient today.  Concerns regarding medicines are outlined above.  No orders of the defined types were placed in this encounter.  No orders of the defined types were placed in this encounter.   Chief Complaint  Patient presents with  . Follow-up  . Congestive Heart Failure    History of Present Illness:    Gabrielle Page is a 58 y.o. female with a hx of CAD with NonSTEMI with PCI and DES of LAD September 2018 and CABG 03/26/2017 with LIMA to LAD, SVG to Diag and SVG to OM and  post operative PAF and pericardial effusion   last seen 10/13/2018.  Her proBNP level was moderately elevated at 302  echocardiogram showed normal ejection fraction mild left ventricular hypertrophy and elevated diastolic pressure with pseudo-normal pattern.  Is mild to moderate mitral regurgitation. Compliance with diet, lifestyle and medications: Yes  Echo 10/14/2018:  1. The left ventricle has normal systolic function with an ejection fraction of 60-65%. The cavity size was normal. There is mild concentric left ventricular hypertrophy. Left ventricular diastolic Doppler parameters are consistent with  pseudonormalization. Elevated mean left atrial pressure There is abnormal septal motion consistent with left bundle branch block. No evidence of left ventricular regional wall motion abnormalities.  2. The right ventricle has normal systolic function. The cavity was normal. There is no increase in right ventricular wall thickness.  3. The aortic root and ascending aorta are normal in size and structure.  4. The aortic valve is tricuspid. No stenosis of the aortic valve.  5. Mitral valve regurgitation is mild to moderate by color flow Doppler. No evidence of mitral valve stenosis.  She is not doing well although her peripheral edema has resolved with a diuretic.  She is increasingly fatigued short of breath even with light housework and walking indoors and continues to have orthopnea.  She has chest pain that is quite atypical nonanginal nonexertional right epigastric right lower sternal rates of right chest and right arm without activity lasted few minutes and resolve spontaneously.  Other problems have included persistent constipation and nausea is on a calcium channel blocker I will have her stop ability to follow-up with GI because she had COVID-19 and is unable to schedule follow-up.  Her presentation of  COVID was very unusual and is predominantly headache no respiratory symptoms or fever and she had a positive PCR nasopharyngeal swab.  With her failure to improve with diuretic therapy ongoing symptoms of heart  failure and chest pain I advised her to undergo diagnostic left and right heart catheterization risk benefits and options detailed she agrees.  I will reduce her diuretic both furosemide and spironolactone by 50% stop her calcium channel blocker with severe constipation and a systolic blood pressure that soft less than 100 and await results for further medical therapy.  Her echocardiogram shows no pericardial effusion and no findings of constriction except for the fact that her IVC was mildly dilated abnormal septal motion.  She has had no fever chills palpitation cough wheezing or syncope.  Her EKG shows left bundle branch block. Past Medical History:  Diagnosis Date  . Asthma   . Chronic bronchitis ([place])    "usually get it q yr" (11/05/2016)  . GERD (gastroesophageal reflux disease)   . Headache    "weekly" (11/05/2016)  . Heart murmur    "dx'd when I was a child"  . Hepatitis 1991   "when I had my gallbladder out" (11/05/2016)  . History of blood transfusion 1985   S/P childbirth  . History of kidney stones   . NSTEMI (non-ST elevated myocardial infarction) ([place]) 11/05/2016   9/18 PCI/DESx1 to p/mLAD  . Pancreatitis 1991  . Pneumonia    "twice" (11/05/2016)    Past Surgical History:  Procedure Laterality Date  . ABDOMINAL HYSTERECTOMY  1990  . APPENDECTOMY  1992  . BILE DUCT EXPLORATION  1991   "reconstruction"  . CESAREAN SECTION  1985  . CHOLECYSTECTOMY OPEN  1991  . CORONARY ANGIOPLASTY WITH STENT PLACEMENT  11/05/2016  . CORONARY ARTERY BYPASS GRAFT N/A 03/26/2017   Procedure: CORONARY ARTERY BYPASS GRAFTING (CABG) x 3 WITH ENDOSCOPIC HARVESTING OF RIGHT SAPHENOUS VEIN;  Surgeon: Ivin Poot, MD;  Location: [place];  Service: Open Heart Surgery;  Laterality: N/A;  . CORONARY STENT INTERVENTION N/A 11/05/2016   Procedure: CORONARY STENT INTERVENTION;  Surgeon: Martinique, Peter M, MD;  Location: [place] [facility];  Service: Cardiovascular;  Laterality: N/A;  . CYSTOSCOPY WITH  STENT PLACEMENT  "3 times" (11/05/2016)  . DILATION AND CURETTAGE OF UTERUS  1989  . INTRAVASCULAR ULTRASOUND/IVUS N/A 03/22/2017   Procedure: Intravascular Ultrasound/IVUS;  Surgeon: Martinique, Peter M, MD;  Location: [place] [facility];  Service: Cardiovascular;  Laterality: N/A;  . KNEE ARTHROSCOPY Right 2000s  . LEFT HEART CATH AND CORONARY ANGIOGRAPHY N/A 11/05/2016   Procedure: LEFT HEART CATH AND CORONARY ANGIOGRAPHY;  Surgeon: Martinique, Peter M, MD;  Location: [place] [facility];  Service: Cardiovascular;  Laterality: N/A;  . LEFT HEART CATH AND CORONARY ANGIOGRAPHY N/A 03/22/2017   Procedure: LEFT HEART CATH AND CORONARY ANGIOGRAPHY;  Surgeon: Martinique, Peter M, MD;  Location: [place] [facility];  Service: Cardiovascular;  Laterality: N/A;  . NASAL SINUS SURGERY  ~ 2009   "exposed to mildew; had to have the infectoin drained; cut the outside of my face"  . OVARIAN CYST SURGERY  X 6  . TEE WITHOUT CARDIOVERSION N/A 03/26/2017   Procedure: TRANSESOPHAGEAL ECHOCARDIOGRAM (TEE);  Surgeon: Prescott Gum, [name], MD;  Location: [place];  Service: Open Heart Surgery;  Laterality: N/A;    Current Medications: Current Meds  Medication Sig  . acetaminophen (TYLENOL) 325 MG tablet Take 650 mg by mouth every 6 (six) hours as needed.  Marland Kitchen albuterol (VENTOLIN  HFA) 108 (90 Base) MCG/ACT inhaler Inhale 2 puffs into the lungs every 6 (six) hours as needed for wheezing or shortness of breath.  Marland Kitchen amLODipine (NORVASC) 5 MG tablet Take 1 tablet (5 mg total) by mouth daily.  Marland Kitchen aspirin EC 81 MG tablet Take 81 mg by mouth daily.  Marland Kitchen atorvastatin (LIPITOR) 10 MG tablet Take 1 tablet (10 mg total) by mouth daily at 6 PM.  . benzonatate (TESSALON) 100 MG capsule Take 1 capsule by mouth 3 (three) times daily as needed.   . dicyclomine (BENTYL) 20 MG tablet TAKE 1 TABLET BY MOUTH THREE TIMES DAILY BEFORE MEAL(S) FOR ABDOMINAL PAIN  . furosemide (LASIX) 40 MG tablet Take 1 tablet (40 mg total) by mouth daily.  . metoprolol tartrate  (LOPRESSOR) 25 MG tablet Take 1 tablet (25 mg total) by mouth 2 (two) times daily.  . Multiple Vitamin (MULTIVITAMIN WITH MINERALS) TABS tablet Take 1 tablet by mouth daily.  . nitroGLYCERIN (NITROSTAT) 0.4 MG SL tablet Place 0.4 mg under the tongue every 5 (five) minutes as needed for chest pain.  . Omega-3 Fatty Acids (FISH OIL) 1000 MG CAPS Take 1 capsule by mouth daily.  . ondansetron (ZOFRAN) 4 MG tablet TAKE 1 Tablet BY MOUTH EVERY 8 HOURS AS NEEDED FOR NAUSEA AND VOMITING  . Potassium Bicarb-Citric Acid 20 MEQ TBEF Take 1 tablet by mouth daily.   Marland Kitchen spironolactone (ALDACTONE) 25 MG tablet Take 1 tablet (25 mg total) by mouth daily.  . traZODone (DESYREL) 50 MG tablet Take 1 tablet by mouth at bedtime.  . [DISCONTINUED] aspirin EC 325 MG EC tablet Take 1 tablet (325 mg total) by mouth daily.     Allergies:   Amiodarone, Colchicine, Compazine [prochlorperazine edisylate], and Other   Social History   Socioeconomic History  . Marital status: Divorced    Spouse name: Not on file  . Number of children: Not on file  . Years of education: Not on file  . Highest education level: Not on file  Occupational History  . Not on file  Social Needs  . Financial resource strain: Not on file  . Food insecurity    Worry: Not on file    Inability: Not on file  . Transportation needs    Medical: Not on file    Non-medical: Not on file  Tobacco Use  . Smoking status: Never Smoker  . Smokeless tobacco: Never Used  Substance and Sexual Activity  . Alcohol use: Yes    Comment: 11/05/2016 "maybe 1 glass of wine/month"  . Drug use: No  . Sexual activity: Not Currently  Lifestyle  . Physical activity    Days per week: Not on file    Minutes per session: Not on file  . Stress: Not on file  Relationships  . Social Herbalist on phone: Not on file    Gets together: Not on file    Attends religious service: Not on file    Active member of club or organization: Not on file    Attends  meetings of clubs or organizations: Not on file    Relationship status: Not on file  Other Topics Concern  . Not on file  Social History Narrative  . Not on file     Family History: The patient's family history includes Alcohol abuse in her father; Diabetes Mellitus II in her father; Heart attack in her father; Hypertension in her father; Suicidality in her mother. ROS:   Please see the  history of present illness.    All other systems reviewed and are negative.  EKGs/Labs/Other Studies Reviewed:    The following studies were reviewed today:  EKG:  EKG ordered today and personally reviewed.  The ekg ordered today demonstrates sinus rhythm left bundle branch block  Recent Labs: 10/13/2018: BUN 31; Creatinine, Ser 1.00; NT-Pro BNP 302; Potassium 4.9; Sodium 141  Recent Lipid Panel    Component Value Date/Time   CHOL 75 04/08/2017 0440   TRIG 107 04/08/2017 0440   HDL 20 (L) 04/08/2017 0440   CHOLHDL 3.8 04/08/2017 0440   VLDL 21 04/08/2017 0440   LDLCALC 34 04/08/2017 0440    Physical Exam:    VS:  BP 96/62 (BP Location: Right Arm, Patient Position: Sitting, Cuff Size: Normal)   Pulse 60   Temp 97.6 F (36.4 C)   Ht 5\' 3"  (1.6 m)   Wt 119 lb 1.9 oz (54 kg)   SpO2 96%   BMI 21.10 kg/m     Wt Readings from Last 3 Encounters:  11/13/18 119 lb 1.9 oz (54 kg)  10/13/18 124 lb (56.2 kg)  06/19/17 133 lb (60.3 kg)     GEN: She looks listless week has a flat affect and depressed mood well nourished, well developed in no acute distress HEENT: Normal NECK: No JVD; No carotid bruits LYMPHATICS: No lymphadenopathy CARDIAC: S2 paradoxical no S3 RRR, no murmurs, rubs, gallops RESPIRATORY:  Clear to auscultation without rales, wheezing or rhonchi  ABDOMEN: Soft, non-tender, non-distended MUSCULOSKELETAL:  No edema; No deformity  SKIN: Warm and dry NEUROLOGIC:  Alert and oriented x 3 PSYCHIATRIC:  Normal affect    Signed, Gabrielle More, MD  11/13/2018 11:59 AM    [place]

## 2018-11-13 NOTE — Progress Notes (Signed)
Cardiology Office Note:    Date:  11/13/2018   ID:  Gabrielle Page, DOB 17-Dec-1960, MRN [id number]  PCP:  Jaclynn Major, NP  Cardiologist:  Shirlee More, MD    Referring MD: Jaclynn Major, NP    ASSESSMENT:    No diagnosis found. PLAN:    In order of problems listed above:  1. Heart failure, clinically she has diastolic heart failure edema last visit typical symptoms and pseudo-normal diastolic function.  Unfortunately she has not responded to medical treatment I feel she would benefit from a full right heart catheterization specially with her previous pericardial effusion after bypass surgery although clinically she has no markers of constriction she will stop her calcium channel blocker reduce her diuretic dosage and check full labs including proBNP renal function and potassium prior to left and right heart catheterization 2. CAD status post CABG with atypical chest pain she undergo left heart catheterization coronary angiography. 3. Left bundle branch block stable EKG pattern she does not have dyssynchrony and her QRS is relatively narrow at 148 ms. 4. Hyperlipidemia continue her statin,    Next appointment: 4 weeks after left and right heart catheterization   Medication Adjustments/Labs and Tests Ordered: Current medicines are reviewed at length with the patient today.  Concerns regarding medicines are outlined above.  No orders of the defined types were placed in this encounter.  No orders of the defined types were placed in this encounter.   Chief Complaint  Patient presents with  . Follow-up  . Congestive Heart Failure    History of Present Illness:    Gabrielle Page is a 58 y.o. female with a hx of CAD with NonSTEMI with PCI and DES of LAD September 2018 and CABG 03/26/2017 with LIMA to LAD, SVG to Diag and SVG to OM and  post operative PAF and pericardial effusion   last seen 10/13/2018.  Her proBNP level was moderately elevated at 302  echocardiogram showed normal ejection fraction mild left ventricular hypertrophy and elevated diastolic pressure with pseudo-normal pattern.  Is mild to moderate mitral regurgitation. Compliance with diet, lifestyle and medications: Yes  Echo 10/14/2018:  1. The left ventricle has normal systolic function with an ejection fraction of 60-65%. The cavity size was normal. There is mild concentric left ventricular hypertrophy. Left ventricular diastolic Doppler parameters are consistent with  pseudonormalization. Elevated mean left atrial pressure There is abnormal septal motion consistent with left bundle branch block. No evidence of left ventricular regional wall motion abnormalities.  2. The right ventricle has normal systolic function. The cavity was normal. There is no increase in right ventricular wall thickness.  3. The aortic root and ascending aorta are normal in size and structure.  4. The aortic valve is tricuspid. No stenosis of the aortic valve.  5. Mitral valve regurgitation is mild to moderate by color flow Doppler. No evidence of mitral valve stenosis.  She is not doing well although her peripheral edema has resolved with a diuretic.  She is increasingly fatigued short of breath even with light housework and walking indoors and continues to have orthopnea.  She has chest pain that is quite atypical nonanginal nonexertional right epigastric right lower sternal rates of right chest and right arm without activity lasted few minutes and resolve spontaneously.  Other problems have included persistent constipation and nausea is on a calcium channel blocker I will have her stop ability to follow-up with GI because she had COVID-19 and is unable to schedule follow-up.  Her presentation of  COVID was very unusual and is predominantly headache no respiratory symptoms or fever and she had a positive PCR nasopharyngeal swab.  With her failure to improve with diuretic therapy ongoing symptoms of heart  failure and chest pain I advised her to undergo diagnostic left and right heart catheterization risk benefits and options detailed she agrees.  I will reduce her diuretic both furosemide and spironolactone by 50% stop her calcium channel blocker with severe constipation and a systolic blood pressure that soft less than 100 and await results for further medical therapy.  Her echocardiogram shows no pericardial effusion and no findings of constriction except for the fact that her IVC was mildly dilated abnormal septal motion.  She has had no fever chills palpitation cough wheezing or syncope.  Her EKG shows left bundle branch block. Past Medical History:  Diagnosis Date  . Asthma   . Chronic bronchitis ([place])    "usually get it q yr" (11/05/2016)  . GERD (gastroesophageal reflux disease)   . Headache    "weekly" (11/05/2016)  . Heart murmur    "dx'd when I was a child"  . Hepatitis 1991   "when I had my gallbladder out" (11/05/2016)  . History of blood transfusion 1985   S/P childbirth  . History of kidney stones   . NSTEMI (non-ST elevated myocardial infarction) ([place]) 11/05/2016   9/18 PCI/DESx1 to p/mLAD  . Pancreatitis 1991  . Pneumonia    "twice" (11/05/2016)    Past Surgical History:  Procedure Laterality Date  . ABDOMINAL HYSTERECTOMY  1990  . APPENDECTOMY  1992  . BILE DUCT EXPLORATION  1991   "reconstruction"  . CESAREAN SECTION  1985  . CHOLECYSTECTOMY OPEN  1991  . CORONARY ANGIOPLASTY WITH STENT PLACEMENT  11/05/2016  . CORONARY ARTERY BYPASS GRAFT N/A 03/26/2017   Procedure: CORONARY ARTERY BYPASS GRAFTING (CABG) x 3 WITH ENDOSCOPIC HARVESTING OF RIGHT SAPHENOUS VEIN;  Surgeon: Ivin Poot, MD;  Location: [place];  Service: Open Heart Surgery;  Laterality: N/A;  . CORONARY STENT INTERVENTION N/A 11/05/2016   Procedure: CORONARY STENT INTERVENTION;  Surgeon: Martinique, Peter M, MD;  Location: [place] [facility];  Service: Cardiovascular;  Laterality: N/A;  . CYSTOSCOPY WITH  STENT PLACEMENT  "3 times" (11/05/2016)  . DILATION AND CURETTAGE OF UTERUS  1989  . INTRAVASCULAR ULTRASOUND/IVUS N/A 03/22/2017   Procedure: Intravascular Ultrasound/IVUS;  Surgeon: Martinique, Peter M, MD;  Location: [place] [facility];  Service: Cardiovascular;  Laterality: N/A;  . KNEE ARTHROSCOPY Right 2000s  . LEFT HEART CATH AND CORONARY ANGIOGRAPHY N/A 11/05/2016   Procedure: LEFT HEART CATH AND CORONARY ANGIOGRAPHY;  Surgeon: Martinique, Peter M, MD;  Location: [place] [facility];  Service: Cardiovascular;  Laterality: N/A;  . LEFT HEART CATH AND CORONARY ANGIOGRAPHY N/A 03/22/2017   Procedure: LEFT HEART CATH AND CORONARY ANGIOGRAPHY;  Surgeon: Martinique, Peter M, MD;  Location: [place] [facility];  Service: Cardiovascular;  Laterality: N/A;  . NASAL SINUS SURGERY  ~ 2009   "exposed to mildew; had to have the infectoin drained; cut the outside of my face"  . OVARIAN CYST SURGERY  X 6  . TEE WITHOUT CARDIOVERSION N/A 03/26/2017   Procedure: TRANSESOPHAGEAL ECHOCARDIOGRAM (TEE);  Surgeon: Prescott Gum, [name], MD;  Location: [place];  Service: Open Heart Surgery;  Laterality: N/A;    Current Medications: Current Meds  Medication Sig  . acetaminophen (TYLENOL) 325 MG tablet Take 650 mg by mouth every 6 (six) hours as needed.  Marland Kitchen albuterol (VENTOLIN  HFA) 108 (90 Base) MCG/ACT inhaler Inhale 2 puffs into the lungs every 6 (six) hours as needed for wheezing or shortness of breath.  Marland Kitchen amLODipine (NORVASC) 5 MG tablet Take 1 tablet (5 mg total) by mouth daily.  Marland Kitchen aspirin EC 81 MG tablet Take 81 mg by mouth daily.  Marland Kitchen atorvastatin (LIPITOR) 10 MG tablet Take 1 tablet (10 mg total) by mouth daily at 6 PM.  . benzonatate (TESSALON) 100 MG capsule Take 1 capsule by mouth 3 (three) times daily as needed.   . dicyclomine (BENTYL) 20 MG tablet TAKE 1 TABLET BY MOUTH THREE TIMES DAILY BEFORE MEAL(S) FOR ABDOMINAL PAIN  . furosemide (LASIX) 40 MG tablet Take 1 tablet (40 mg total) by mouth daily.  . metoprolol tartrate  (LOPRESSOR) 25 MG tablet Take 1 tablet (25 mg total) by mouth 2 (two) times daily.  . Multiple Vitamin (MULTIVITAMIN WITH MINERALS) TABS tablet Take 1 tablet by mouth daily.  . nitroGLYCERIN (NITROSTAT) 0.4 MG SL tablet Place 0.4 mg under the tongue every 5 (five) minutes as needed for chest pain.  . Omega-3 Fatty Acids (FISH OIL) 1000 MG CAPS Take 1 capsule by mouth daily.  . ondansetron (ZOFRAN) 4 MG tablet TAKE 1 Tablet BY MOUTH EVERY 8 HOURS AS NEEDED FOR NAUSEA AND VOMITING  . Potassium Bicarb-Citric Acid 20 MEQ TBEF Take 1 tablet by mouth daily.   Marland Kitchen spironolactone (ALDACTONE) 25 MG tablet Take 1 tablet (25 mg total) by mouth daily.  . traZODone (DESYREL) 50 MG tablet Take 1 tablet by mouth at bedtime.  . [DISCONTINUED] aspirin EC 325 MG EC tablet Take 1 tablet (325 mg total) by mouth daily.     Allergies:   Amiodarone, Colchicine, Compazine [prochlorperazine edisylate], and Other   Social History   Socioeconomic History  . Marital status: Divorced    Spouse name: Not on file  . Number of children: Not on file  . Years of education: Not on file  . Highest education level: Not on file  Occupational History  . Not on file  Social Needs  . Financial resource strain: Not on file  . Food insecurity    Worry: Not on file    Inability: Not on file  . Transportation needs    Medical: Not on file    Non-medical: Not on file  Tobacco Use  . Smoking status: Never Smoker  . Smokeless tobacco: Never Used  Substance and Sexual Activity  . Alcohol use: Yes    Comment: 11/05/2016 "maybe 1 glass of wine/month"  . Drug use: No  . Sexual activity: Not Currently  Lifestyle  . Physical activity    Days per week: Not on file    Minutes per session: Not on file  . Stress: Not on file  Relationships  . Social Herbalist on phone: Not on file    Gets together: Not on file    Attends religious service: Not on file    Active member of club or organization: Not on file    Attends  meetings of clubs or organizations: Not on file    Relationship status: Not on file  Other Topics Concern  . Not on file  Social History Narrative  . Not on file     Family History: The patient's family history includes Alcohol abuse in her father; Diabetes Mellitus II in her father; Heart attack in her father; Hypertension in her father; Suicidality in her mother. ROS:   Please see the  history of present illness.    All other systems reviewed and are negative.  EKGs/Labs/Other Studies Reviewed:    The following studies were reviewed today:  EKG:  EKG ordered today and personally reviewed.  The ekg ordered today demonstrates sinus rhythm left bundle branch block  Recent Labs: 10/13/2018: BUN 31; Creatinine, Ser 1.00; NT-Pro BNP 302; Potassium 4.9; Sodium 141  Recent Lipid Panel    Component Value Date/Time   CHOL 75 04/08/2017 0440   TRIG 107 04/08/2017 0440   HDL 20 (L) 04/08/2017 0440   CHOLHDL 3.8 04/08/2017 0440   VLDL 21 04/08/2017 0440   LDLCALC 34 04/08/2017 0440    Physical Exam:    VS:  BP 96/62 (BP Location: Right Arm, Patient Position: Sitting, Cuff Size: Normal)   Pulse 60   Temp 97.6 F (36.4 C)   Ht 5\' 3"  (1.6 m)   Wt 119 lb 1.9 oz (54 kg)   SpO2 96%   BMI 21.10 kg/m     Wt Readings from Last 3 Encounters:  11/13/18 119 lb 1.9 oz (54 kg)  10/13/18 124 lb (56.2 kg)  06/19/17 133 lb (60.3 kg)     GEN: She looks listless week has a flat affect and depressed mood well nourished, well developed in no acute distress HEENT: Normal NECK: No JVD; No carotid bruits LYMPHATICS: No lymphadenopathy CARDIAC: S2 paradoxical no S3 RRR, no murmurs, rubs, gallops RESPIRATORY:  Clear to auscultation without rales, wheezing or rhonchi  ABDOMEN: Soft, non-tender, non-distended MUSCULOSKELETAL:  No edema; No deformity  SKIN: Warm and dry NEUROLOGIC:  Alert and oriented x 3 PSYCHIATRIC:  Normal affect    Signed, Shirlee More, MD  11/13/2018 11:59 AM    [place]

## 2018-11-14 LAB — CBC
Hematocrit: 35.7 % (ref 34.0–46.6)
Hemoglobin: 12 g/dL (ref 11.1–15.9)
MCH: 30.5 pg (ref 26.6–33.0)
MCHC: 33.6 g/dL (ref 31.5–35.7)
MCV: 91 fL (ref 79–97)
Platelets: 323 10*3/uL (ref 150–450)
RBC: 3.94 x10E6/uL (ref 3.77–5.28)
RDW: 13.4 % (ref 11.7–15.4)
WBC: 7.6 10*3/uL (ref 3.4–10.8)

## 2018-11-14 LAB — BASIC METABOLIC PANEL
BUN/Creatinine Ratio: 19 (ref 9–23)
BUN: 25 mg/dL — ABNORMAL HIGH (ref 6–24)
CO2: 26 mmol/L (ref 20–29)
Calcium: 10.5 mg/dL — ABNORMAL HIGH (ref 8.7–10.2)
Chloride: 100 mmol/L (ref 96–106)
Creatinine, Ser: 1.3 mg/dL — ABNORMAL HIGH (ref 0.57–1.00)
GFR calc Af Amer: 53 mL/min/{1.73_m2} — ABNORMAL LOW (ref >59)
GFR calc non Af Amer: 46 mL/min/{1.73_m2} — ABNORMAL LOW (ref >59)
Glucose: 87 mg/dL (ref 65–99)
Potassium: 4.4 mmol/L (ref 3.5–5.2)
Sodium: 139 mmol/L (ref 134–144)

## 2018-11-14 LAB — PRO B NATRIURETIC PEPTIDE: NT-Pro BNP: 305 pg/mL — ABNORMAL HIGH (ref 0–287)

## 2018-11-20 ENCOUNTER — Telehealth: Payer: Self-pay | Admitting: *Deleted

## 2018-11-20 NOTE — Telephone Encounter (Addendum)
Pt contacted pre-catheterization scheduled at [facility] for: Monday November 24, 2018 9 AM Verified arrival time and place: [facility]) at: 7 AM   No solid food after midnight prior to cath, clear liquids until 5 AM day of procedure. Contrast allergy: no  Hold: Lasix/KCl-day before and day of procedure-GFR 46 Spironolactone-day before and day of procedure -GFR 46  Except hold medications AM meds can be  taken pre-cath with sip of water including: ASA 81 mg   Confirmed patient has responsible adult to drive home post procedure and observe 24 hours after arriving home: yes  Currently, due to Covid-19 pandemic, only one support person will be allowed with patient. Must be the same support person for that patient's entire stay, will be screened and required to wear a mask. They will be asked to wait in the waiting room for the duration of the patient's stay.  Patients are required to wear a mask when they enter the hospital.      COVID-19 Pre-Screening Questions:  . In the past 7 to 10 days have you had a cough,  shortness of breath, headache, congestion, fever (100 or greater) body aches, chills, sore throat, or sudden loss of taste or sense of smell? shortness of breath . Have you been around anyone with known Covid 19? no . Have you been around anyone who is awaiting Covid 19 test results in the past 7 to 10 days? no . Have you been around anyone who has been exposed to Covid 19, or has mentioned symptoms of Covid 19 within the past 7 to 10 days? no   I reviewed procedure/mask/visitor instructions, Covid-19 screening questions with patient, she verbalized understanding, thanked me for call.  Pt states she has had Covid-19 (symptom headache), has had negative test since diagnosis and cleared, only symptom now is shortness of breath, not a new symptom in the last 7-10 days. Pt scheduled for Covid-19 11/21/18 prior to procedure 11/25/18.

## 2018-11-21 ENCOUNTER — Other Ambulatory Visit (HOSPITAL_COMMUNITY)
Admission: RE | Admit: 2018-11-21 | Discharge: 2018-11-21 | Disposition: A | Discharge status: Home / Self Care | Payer: Medicaid Other | Source: Ambulatory Visit | Attending: Cardiology | Admitting: Cardiology

## 2018-11-21 DIAGNOSIS — Z01812 Encounter for preprocedural laboratory examination: Secondary | ICD-10-CM | POA: Diagnosis not present

## 2018-11-21 DIAGNOSIS — Z20828 Contact with and (suspected) exposure to other viral communicable diseases: Secondary | ICD-10-CM | POA: Insufficient documentation

## 2018-11-24 ENCOUNTER — Inpatient Hospital Stay (HOSPITAL_COMMUNITY)
Admission: RE | Admit: 2018-11-24 | Discharge: 2018-11-27 | DRG: 281 | Disposition: A | Discharge status: Home / Self Care | Payer: Self-pay | Attending: Cardiology | Admitting: Cardiology

## 2018-11-24 ENCOUNTER — Encounter (HOSPITAL_COMMUNITY): Payer: Self-pay | Admitting: *Deleted

## 2018-11-24 ENCOUNTER — Other Ambulatory Visit: Payer: Self-pay

## 2018-11-24 ENCOUNTER — Encounter (HOSPITAL_COMMUNITY)
Admission: RE | Disposition: A | Discharge status: Home / Self Care | Payer: Self-pay | Source: Home / Self Care | Attending: Cardiology

## 2018-11-24 DIAGNOSIS — I2119 ST elevation (STEMI) myocardial infarction involving other coronary artery of inferior wall: Principal | ICD-10-CM | POA: Diagnosis present

## 2018-11-24 DIAGNOSIS — Y92234 Operating room of hospital as the place of occurrence of the external cause: Secondary | ICD-10-CM | POA: Diagnosis not present

## 2018-11-24 DIAGNOSIS — I21A9 Other myocardial infarction type: Secondary | ICD-10-CM | POA: Diagnosis not present

## 2018-11-24 DIAGNOSIS — Z79899 Other long term (current) drug therapy: Secondary | ICD-10-CM

## 2018-11-24 DIAGNOSIS — I25700 Atherosclerosis of coronary artery bypass graft(s), unspecified, with unstable angina pectoris: Secondary | ICD-10-CM | POA: Diagnosis present

## 2018-11-24 DIAGNOSIS — I252 Old myocardial infarction: Secondary | ICD-10-CM

## 2018-11-24 DIAGNOSIS — I340 Nonrheumatic mitral (valve) insufficiency: Secondary | ICD-10-CM | POA: Diagnosis present

## 2018-11-24 DIAGNOSIS — K5900 Constipation, unspecified: Secondary | ICD-10-CM | POA: Diagnosis present

## 2018-11-24 DIAGNOSIS — D649 Anemia, unspecified: Secondary | ICD-10-CM | POA: Diagnosis present

## 2018-11-24 DIAGNOSIS — Y840 Cardiac catheterization as the cause of abnormal reaction of the patient, or of later complication, without mention of misadventure at the time of the procedure: Secondary | ICD-10-CM | POA: Diagnosis not present

## 2018-11-24 DIAGNOSIS — Z833 Family history of diabetes mellitus: Secondary | ICD-10-CM

## 2018-11-24 DIAGNOSIS — I25119 Atherosclerotic heart disease of native coronary artery with unspecified angina pectoris: Secondary | ICD-10-CM

## 2018-11-24 DIAGNOSIS — E876 Hypokalemia: Secondary | ICD-10-CM | POA: Diagnosis present

## 2018-11-24 DIAGNOSIS — E785 Hyperlipidemia, unspecified: Secondary | ICD-10-CM

## 2018-11-24 DIAGNOSIS — I214 Non-ST elevation (NSTEMI) myocardial infarction: Secondary | ICD-10-CM

## 2018-11-24 DIAGNOSIS — Z888 Allergy status to other drugs, medicaments and biological substances status: Secondary | ICD-10-CM

## 2018-11-24 DIAGNOSIS — Z8619 Personal history of other infectious and parasitic diseases: Secondary | ICD-10-CM

## 2018-11-24 DIAGNOSIS — Y713 Surgical instruments, materials and cardiovascular devices (including sutures) associated with adverse incidents: Secondary | ICD-10-CM | POA: Diagnosis not present

## 2018-11-24 DIAGNOSIS — Z23 Encounter for immunization: Secondary | ICD-10-CM

## 2018-11-24 DIAGNOSIS — E782 Mixed hyperlipidemia: Secondary | ICD-10-CM | POA: Diagnosis present

## 2018-11-24 DIAGNOSIS — I25110 Atherosclerotic heart disease of native coronary artery with unstable angina pectoris: Secondary | ICD-10-CM | POA: Diagnosis present

## 2018-11-24 DIAGNOSIS — Z8249 Family history of ischemic heart disease and other diseases of the circulatory system: Secondary | ICD-10-CM

## 2018-11-24 DIAGNOSIS — I447 Left bundle-branch block, unspecified: Secondary | ICD-10-CM | POA: Diagnosis present

## 2018-11-24 DIAGNOSIS — Z951 Presence of aortocoronary bypass graft: Secondary | ICD-10-CM

## 2018-11-24 DIAGNOSIS — I97790 Other intraoperative cardiac functional disturbances during cardiac surgery: Secondary | ICD-10-CM | POA: Diagnosis not present

## 2018-11-24 DIAGNOSIS — K219 Gastro-esophageal reflux disease without esophagitis: Secondary | ICD-10-CM | POA: Diagnosis present

## 2018-11-24 DIAGNOSIS — Z20828 Contact with and (suspected) exposure to other viral communicable diseases: Secondary | ICD-10-CM | POA: Diagnosis present

## 2018-11-24 DIAGNOSIS — I5032 Chronic diastolic (congestive) heart failure: Secondary | ICD-10-CM | POA: Diagnosis present

## 2018-11-24 DIAGNOSIS — J45909 Unspecified asthma, uncomplicated: Secondary | ICD-10-CM | POA: Diagnosis present

## 2018-11-24 HISTORY — DX: ST elevation (STEMI) myocardial infarction involving other coronary artery of inferior wall: I21.19

## 2018-11-24 HISTORY — PX: RIGHT/LEFT HEART CATH AND CORONARY/GRAFT ANGIOGRAPHY: CATH118267

## 2018-11-24 LAB — POCT I-STAT 7, (LYTES, BLD GAS, ICA,H+H)
Acid-base deficit: 7 mmol/L — ABNORMAL HIGH (ref 0.0–2.0)
Bicarbonate: 17.6 mmol/L — ABNORMAL LOW (ref 20.0–28.0)
Calcium, Ion: 1.43 mmol/L — ABNORMAL HIGH (ref 1.15–1.40)
HCT: 31 % — ABNORMAL LOW (ref 36.0–46.0)
Hemoglobin: 10.5 g/dL — ABNORMAL LOW (ref 12.0–15.0)
O2 Saturation: 99 %
Potassium: 3.3 mmol/L — ABNORMAL LOW (ref 3.5–5.1)
Sodium: 139 mmol/L (ref 135–145)
TCO2: 19 mmol/L — ABNORMAL LOW (ref 22–32)
pCO2 arterial: 33.3 mmHg (ref 32.0–48.0)
pH, Arterial: 7.33 — ABNORMAL LOW (ref 7.350–7.450)
pO2, Arterial: 156 mmHg — ABNORMAL HIGH (ref 83.0–108.0)

## 2018-11-24 LAB — POCT I-STAT EG7
Acid-base deficit: 8 mmol/L — ABNORMAL HIGH (ref 0.0–2.0)
Acid-base deficit: 9 mmol/L — ABNORMAL HIGH (ref 0.0–2.0)
Bicarbonate: 18.2 mmol/L — ABNORMAL LOW (ref 20.0–28.0)
Bicarbonate: 17.6 mmol/L — ABNORMAL LOW (ref 20.0–28.0)
Calcium, Ion: 1.35 mmol/L (ref 1.15–1.40)
Calcium, Ion: 1.29 mmol/L (ref 1.15–1.40)
HCT: 29 % — ABNORMAL LOW (ref 36.0–46.0)
HCT: 29 % — ABNORMAL LOW (ref 36.0–46.0)
Hemoglobin: 9.9 g/dL — ABNORMAL LOW (ref 12.0–15.0)
Hemoglobin: 9.9 g/dL — ABNORMAL LOW (ref 12.0–15.0)
O2 Saturation: 74 %
O2 Saturation: 71 %
Potassium: 2.9 mmol/L — ABNORMAL LOW (ref 3.5–5.1)
Potassium: 2.9 mmol/L — ABNORMAL LOW (ref 3.5–5.1)
Sodium: 140 mmol/L (ref 135–145)
Sodium: 141 mmol/L (ref 135–145)
TCO2: 19 mmol/L — ABNORMAL LOW (ref 22–32)
TCO2: 19 mmol/L — ABNORMAL LOW (ref 22–32)
pCO2, Ven: 41.2 mmHg — ABNORMAL LOW (ref 44.0–60.0)
pCO2, Ven: 39.7 mmHg — ABNORMAL LOW (ref 44.0–60.0)
pH, Ven: 7.253 (ref 7.250–7.430)
pH, Ven: 7.255 (ref 7.250–7.430)
pO2, Ven: 45 mmHg (ref 32.0–45.0)
pO2, Ven: 43 mmHg (ref 32.0–45.0)

## 2018-11-24 LAB — NOVEL CORONAVIRUS, NAA (HOSP ORDER, SEND-OUT TO REF LAB; TAT 18-24 HRS): SARS-CoV-2, NAA: NOT DETECTED

## 2018-11-24 LAB — TROPONIN I (HIGH SENSITIVITY)
Troponin I (High Sensitivity): 39 ng/L — ABNORMAL HIGH (ref <18)
Troponin I (High Sensitivity): 660 ng/L (ref <18)
Troponin I (High Sensitivity): 3355 ng/L (ref <18)

## 2018-11-24 LAB — POCT ACTIVATED CLOTTING TIME
Activated Clotting Time: 197 seconds
Activated Clotting Time: 340 seconds

## 2018-11-24 LAB — SURGICAL PCR SCREEN
MRSA, PCR: POSITIVE — AB
Staphylococcus aureus: POSITIVE — AB

## 2018-11-24 SURGERY — RIGHT/LEFT HEART CATH AND CORONARY/GRAFT ANGIOGRAPHY
Anesthesia: LOCAL

## 2018-11-24 MED ORDER — HEPARIN SODIUM (PORCINE) 1000 UNIT/ML IJ SOLN
INTRAMUSCULAR | Status: DC | PRN
  Administered 2018-11-24: 3000 [IU] via INTRAVENOUS

## 2018-11-24 MED ORDER — LIDOCAINE HCL (PF) 1 % IJ SOLN
INTRAMUSCULAR | Status: DC | PRN
  Administered 2018-11-24 (×2): 2 mL

## 2018-11-24 MED ORDER — VERAPAMIL HCL 2.5 MG/ML IV SOLN
INTRAVENOUS | Status: AC
  Filled 2018-11-24: qty 2

## 2018-11-24 MED ORDER — METOPROLOL TARTRATE 25 MG PO TABS
25.0000 mg | ORAL_TABLET | Freq: Two times a day (BID) | ORAL | Status: DC
  Administered 2018-11-24 – 2018-11-27 (×2): 25 mg via ORAL
  Filled 2018-11-24 (×5): qty 1

## 2018-11-24 MED ORDER — ASPIRIN 81 MG PO CHEW: 81.0000 mg | CHEWABLE_TABLET | ORAL | Status: DC

## 2018-11-24 MED ORDER — MIDAZOLAM HCL 2 MG/2ML IJ SOLN
INTRAMUSCULAR | Status: DC | PRN
  Administered 2018-11-24 (×4): 1 mg via INTRAVENOUS

## 2018-11-24 MED ORDER — MUPIROCIN 2 % EX OINT
1.0000 "application " | TOPICAL_OINTMENT | Freq: Two times a day (BID) | CUTANEOUS | Status: DC
  Administered 2018-11-24 – 2018-11-26 (×6): 1 via NASAL
  Filled 2018-11-24: qty 22

## 2018-11-24 MED ORDER — HEPARIN (PORCINE) IN NACL 1000-0.9 UT/500ML-% IV SOLN
INTRAVENOUS | Status: DC | PRN
  Administered 2018-11-24 (×3): 500 mL

## 2018-11-24 MED ORDER — FENTANYL CITRATE (PF) 100 MCG/2ML IJ SOLN
INTRAMUSCULAR | Status: DC | PRN
  Administered 2018-11-24 (×5): 25 ug via INTRAVENOUS

## 2018-11-24 MED ORDER — CHLORHEXIDINE GLUCONATE CLOTH 2 % EX PADS
6.0000 | MEDICATED_PAD | Freq: Every day | CUTANEOUS | Status: DC
  Administered 2018-11-27: 6 via TOPICAL

## 2018-11-24 MED ORDER — FAMOTIDINE IN NACL 20-0.9 MG/50ML-% IV SOLN
INTRAVENOUS | Status: AC
  Filled 2018-11-24: qty 50

## 2018-11-24 MED ORDER — CLOPIDOGREL BISULFATE 300 MG PO TABS
ORAL_TABLET | ORAL | Status: DC | PRN
  Administered 2018-11-24: 600 mg via ORAL

## 2018-11-24 MED ORDER — NITROGLYCERIN 1 MG/10 ML FOR IR/CATH LAB
INTRA_ARTERIAL | Status: DC | PRN
  Administered 2018-11-24: 200 ug via INTRACORONARY

## 2018-11-24 MED ORDER — MORPHINE SULFATE (PF) 10 MG/ML IV SOLN
INTRAVENOUS | Status: AC
  Filled 2018-11-24: qty 1

## 2018-11-24 MED ORDER — SODIUM CHLORIDE 0.9% FLUSH: 3.0000 mL | INTRAVENOUS | Status: DC | PRN

## 2018-11-24 MED ORDER — FENTANYL CITRATE (PF) 100 MCG/2ML IJ SOLN
INTRAMUSCULAR | Status: AC
  Filled 2018-11-24: qty 2

## 2018-11-24 MED ORDER — ASPIRIN EC 81 MG PO TBEC
81.0000 mg | DELAYED_RELEASE_TABLET | Freq: Every day | ORAL | Status: DC
  Administered 2018-11-25 – 2018-11-27 (×3): 81 mg via ORAL
  Filled 2018-11-24 (×3): qty 1

## 2018-11-24 MED ORDER — MIDAZOLAM HCL 2 MG/2ML IJ SOLN
INTRAMUSCULAR | Status: AC
  Filled 2018-11-24: qty 2

## 2018-11-24 MED ORDER — ALUM & MAG HYDROXIDE-SIMETH 200-200-20 MG/5ML PO SUSP
ORAL | Status: AC
  Filled 2018-11-24: qty 30

## 2018-11-24 MED ORDER — ATORVASTATIN CALCIUM 10 MG PO TABS
10.0000 mg | ORAL_TABLET | Freq: Every day | ORAL | Status: DC
  Administered 2018-11-25: 10 mg via ORAL
  Filled 2018-11-24: qty 1

## 2018-11-24 MED ORDER — MORPHINE SULFATE (PF) 10 MG/ML IV SOLN
INTRAVENOUS | Status: DC | PRN
  Administered 2018-11-24: 2 mg via INTRAVENOUS

## 2018-11-24 MED ORDER — BUSPIRONE HCL 5 MG PO TABS
5.0000 mg | ORAL_TABLET | Freq: Three times a day (TID) | ORAL | Status: DC
  Administered 2018-11-25 – 2018-11-27 (×7): 5 mg via ORAL
  Filled 2018-11-24 (×7): qty 1

## 2018-11-24 MED ORDER — CLOPIDOGREL BISULFATE 75 MG PO TABS
75.0000 mg | ORAL_TABLET | Freq: Every day | ORAL | Status: DC
  Administered 2018-11-25 – 2018-11-27 (×3): 75 mg via ORAL
  Filled 2018-11-24 (×3): qty 1

## 2018-11-24 MED ORDER — NITROGLYCERIN 1 MG/10 ML FOR IR/CATH LAB
INTRA_ARTERIAL | Status: AC
  Filled 2018-11-24: qty 10

## 2018-11-24 MED ORDER — HEPARIN SODIUM (PORCINE) 1000 UNIT/ML IJ SOLN
INTRAMUSCULAR | Status: AC
  Filled 2018-11-24: qty 1

## 2018-11-24 MED ORDER — SODIUM CHLORIDE 0.9 % IV SOLN
250.0000 mL | INTRAVENOUS | Status: DC | PRN
  Administered 2018-11-24 – 2018-11-25 (×2): 250 mL via INTRAVENOUS

## 2018-11-24 MED ORDER — SODIUM CHLORIDE 0.9 % IV SOLN: 250.0000 mL | INTRAVENOUS | Status: DC | PRN

## 2018-11-24 MED ORDER — ALUM & MAG HYDROXIDE-SIMETH 200-200-20 MG/5ML PO SUSP
ORAL | Status: DC | PRN
  Administered 2018-11-24: 30 mL via ORAL

## 2018-11-24 MED ORDER — ALBUTEROL SULFATE (2.5 MG/3ML) 0.083% IN NEBU: 2.5000 mg | INHALATION_SOLUTION | Freq: Four times a day (QID) | RESPIRATORY_TRACT | Status: DC | PRN

## 2018-11-24 MED ORDER — HEPARIN (PORCINE) IN NACL 1000-0.9 UT/500ML-% IV SOLN
INTRAVENOUS | Status: AC
  Filled 2018-11-24: qty 1000

## 2018-11-24 MED ORDER — SODIUM CHLORIDE 0.9 % WEIGHT BASED INFUSION
3.0000 mL/kg/h | INTRAVENOUS | Status: DC
  Administered 2018-11-24: 08:00:00 3 mL/kg/h via INTRAVENOUS

## 2018-11-24 MED ORDER — OXYCODONE HCL 5 MG PO TABS
5.0000 mg | ORAL_TABLET | ORAL | Status: DC | PRN
  Administered 2018-11-24 – 2018-11-26 (×7): 10 mg via ORAL
  Filled 2018-11-24 (×8): qty 2

## 2018-11-24 MED ORDER — SODIUM CHLORIDE 0.9 % IV SOLN
INTRAVENOUS | Status: AC
  Administered 2018-11-24: 16:00:00 via INTRAVENOUS

## 2018-11-24 MED ORDER — ACETAMINOPHEN 325 MG PO TABS: 650.0000 mg | ORAL_TABLET | ORAL | Status: DC | PRN

## 2018-11-24 MED ORDER — SODIUM CHLORIDE 0.9 % WEIGHT BASED INFUSION
1.0000 mL/kg/h | INTRAVENOUS | Status: DC
  Administered 2018-11-24: 250 mL via INTRAVENOUS

## 2018-11-24 MED ORDER — FAMOTIDINE IN NACL 20-0.9 MG/50ML-% IV SOLN
INTRAVENOUS | Status: DC | PRN
  Administered 2018-11-24: 20 mg via INTRAVENOUS

## 2018-11-24 MED ORDER — CHLORHEXIDINE GLUCONATE CLOTH 2 % EX PADS
6.0000 | MEDICATED_PAD | Freq: Every day | CUTANEOUS | Status: DC
  Administered 2018-11-24 – 2018-11-26 (×3): 6 via TOPICAL

## 2018-11-24 MED ORDER — TRAZODONE HCL 50 MG PO TABS
50.0000 mg | ORAL_TABLET | Freq: Every day | ORAL | Status: DC
  Administered 2018-11-24 – 2018-11-26 (×3): 50 mg via ORAL
  Filled 2018-11-24 (×3): qty 1

## 2018-11-24 MED ORDER — HYDROMORPHONE HCL 1 MG/ML IJ SOLN
0.5000 mg | INTRAMUSCULAR | Status: DC | PRN
  Administered 2018-11-24 – 2018-11-26 (×4): 0.5 mg via INTRAVENOUS
  Filled 2018-11-24 (×3): qty 0.5

## 2018-11-24 MED ORDER — SODIUM CHLORIDE 0.9% FLUSH
3.0000 mL | Freq: Two times a day (BID) | INTRAVENOUS | Status: DC
  Administered 2018-11-24 – 2018-11-27 (×5): 3 mL via INTRAVENOUS

## 2018-11-24 MED ORDER — SODIUM CHLORIDE 0.9% FLUSH: 3.0000 mL | Freq: Two times a day (BID) | INTRAVENOUS | Status: DC

## 2018-11-24 MED ORDER — ONDANSETRON HCL 4 MG/2ML IJ SOLN: 4.0000 mg | Freq: Four times a day (QID) | INTRAMUSCULAR | Status: DC | PRN

## 2018-11-24 MED ORDER — LIDOCAINE HCL (PF) 1 % IJ SOLN
INTRAMUSCULAR | Status: AC
  Filled 2018-11-24: qty 30

## 2018-11-24 MED ORDER — HYDROMORPHONE HCL 1 MG/ML IJ SOLN
INTRAMUSCULAR | Status: AC
  Filled 2018-11-24: qty 0.5

## 2018-11-24 MED ORDER — HEPARIN (PORCINE) 25000 UT/250ML-% IV SOLN
750.0000 [IU]/h | INTRAVENOUS | Status: DC
  Administered 2018-11-24: 600 [IU]/h via INTRAVENOUS
  Administered 2018-11-26: 750 [IU]/h via INTRAVENOUS
  Filled 2018-11-24 (×2): qty 250

## 2018-11-24 MED ORDER — VERAPAMIL HCL 2.5 MG/ML IV SOLN
INTRAVENOUS | Status: DC | PRN
  Administered 2018-11-24: 10:00:00 10 mL via INTRA_ARTERIAL

## 2018-11-24 MED ORDER — IOHEXOL 350 MG/ML SOLN
INTRAVENOUS | Status: DC | PRN
  Administered 2018-11-24: 150 mL

## 2018-11-24 SURGICAL SUPPLY — 24 items
BALLN SAPPHIRE 2.0X12 (BALLOONS) ×2
BALLOON SAPPHIRE 2.0X12 (BALLOONS) ×1 IMPLANT
CATH BALLN WEDGE 5F 110CM (CATHETERS) ×2 IMPLANT
CATH DXT MULTI JL4 JR4 ANG PIG (CATHETERS) ×2 IMPLANT
CATH INFINITI 5 FR IM (CATHETERS) ×2 IMPLANT
CATH INFINITI 5 FR JL3.5 (CATHETERS) ×2 IMPLANT
CATH LAUNCHER 5F EBU3.0 (CATHETERS) ×1 IMPLANT
CATH LAUNCHER 5F EBU3.5 (CATHETERS) ×2 IMPLANT
CATHETER LAUNCHER 5F EBU3.0 (CATHETERS) ×2
DEVICE RAD TR BAND REGULAR (VASCULAR PRODUCTS) ×2 IMPLANT
GLIDESHEATH SLEND SS 6F .021 (SHEATH) ×2 IMPLANT
GUIDEWIRE .025 260CM (WIRE) ×2 IMPLANT
GUIDEWIRE INQWIRE 1.5J.035X260 (WIRE) ×1 IMPLANT
INQWIRE 1.5J .035X260CM (WIRE) ×2
KIT ENCORE 26 ADVANTAGE (KITS) ×2 IMPLANT
KIT HEART LEFT (KITS) ×2 IMPLANT
PACK CARDIAC CATHETERIZATION (CUSTOM PROCEDURE TRAY) ×2 IMPLANT
SHEATH GLIDE SLENDER 4/5FR (SHEATH) ×2 IMPLANT
TRANSDUCER W/STOPCOCK (MISCELLANEOUS) ×2 IMPLANT
TUBING CIL FLEX 10 FLL-RA (TUBING) ×2 IMPLANT
WIRE HI TORQ VERSACORE-J 145CM (WIRE) ×2 IMPLANT
WIRE HI TORQ WHISPER MS 190CM (WIRE) ×2 IMPLANT
WIRE PT2 MS 185 (WIRE) ×2 IMPLANT
WIRE RUNTHROUGH .014X180CM (WIRE) ×2 IMPLANT

## 2018-11-24 NOTE — Progress Notes (Signed)
ANTICOAGULATION CONSULT NOTE - Initial Consult  Pharmacy Consult for Heparin Indication: chest pain/ACS  Allergies  Allergen Reactions  . Amiodarone Diarrhea and Nausea And Vomiting    In combination with colchicine  . Colchicine Diarrhea and Nausea And Vomiting    Pt states she had rnx to medication; severe V/D  . Compazine [Prochlorperazine Edisylate] Other (See Comments)    SEIZURE  . Other Other (See Comments)    Steroids cause uncontrollable coughing    Patient Measurements: Height: 5\' 3"  (160 cm) Weight: 115 lb (52.2 kg) IBW/kg (Calculated) : 52.4 Heparin Dosing Weight: 52.2  Vital Signs: Temp: 98 F (36.7 C) (10/12 1330) Temp Source: Oral (10/12 1330) BP: 117/57 (10/12 1330) Pulse Rate: 66 (10/12 1330)  Labs: Recent Labs    11/24/18 1015  HGB 9.9*  HCT 29.0*    Estimated Creatinine Clearance: 39.8 mL/min (A) (by C-G formula based on SCr of 1.3 mg/dL (H)).   Medical History: Past Medical History:  Diagnosis Date  . Asthma   . Chronic bronchitis ([place])    "usually get it q yr" (11/05/2016)  . GERD (gastroesophageal reflux disease)   . Headache    "weekly" (11/05/2016)  . Heart murmur    "dx'd when I was a child"  . Hepatitis 1991   "when I had my gallbladder out" (11/05/2016)  . History of blood transfusion 1985   S/P childbirth  . History of kidney stones   . NSTEMI (non-ST elevated myocardial infarction) ([place]) 11/05/2016   9/18 PCI/DESx1 to p/mLAD  . Pancreatitis 1991  . Pneumonia    "twice" (11/05/2016)    Medications:  Medications Prior to Admission  Medication Sig Dispense Refill Last Dose  . acetaminophen (TYLENOL) 325 MG tablet Take 325 mg by mouth every 6 (six) hours as needed for moderate pain or headache.    11/23/2018 at Unknown time  . aspirin EC 81 MG tablet Take 81 mg by mouth daily.   11/24/2018 at 0500  . atorvastatin (LIPITOR) 10 MG tablet Take 1 tablet (10 mg total) by mouth daily at 6 PM. 30 tablet 1 11/24/2018 at 0500  .  busPIRone (BUSPAR) 5 MG tablet Take 5 mg by mouth 3 (three) times daily.   11/24/2018 at 0500  . furosemide (LASIX) 40 MG tablet Take 0.5 tablets (20 mg total) by mouth daily. 30 tablet 3 Past Week at Unknown time  . metoprolol tartrate (LOPRESSOR) 25 MG tablet Take 1 tablet (25 mg total) by mouth 2 (two) times daily. 60 tablet 1 11/24/2018 at 0500  . Multiple Vitamin (MULTIVITAMIN WITH MINERALS) TABS tablet Take 1 tablet by mouth daily.   11/24/2018 at Unknown time  . nitroGLYCERIN (NITROSTAT) 0.4 MG SL tablet Place 0.4 mg under the tongue every 5 (five) minutes as needed for chest pain.   Past Month at Unknown time  . Omega-3 Fatty Acids (FISH OIL) 1000 MG CAPS Take 1,000 mg by mouth daily.    11/24/2018 at Unknown time  . potassium chloride SA (KLOR-CON) 20 MEQ tablet Take 20 mEq by mouth daily.   Past Week at Unknown time  . spironolactone (ALDACTONE) 25 MG tablet Take 0.5 tablets (12.5 mg total) by mouth daily. 30 tablet 3 Past Week at Unknown time  . traZODone (DESYREL) 50 MG tablet Take 50 mg by mouth at bedtime.    11/23/2018 at Unknown time  . albuterol (VENTOLIN HFA) 108 (90 Base) MCG/ACT inhaler Inhale 2 puffs into the lungs every 6 (six) hours as needed for wheezing  or shortness of breath.   More than a month at Unknown time    Assessment: 33 YOF presented for cardiac cath, which was unsuccessful due to inability to cross lesion. Will medically manage with heparin while completing infarct. - Start heparin 6 hours after sheath removal. - Not on anticoagulation prior to admission.  Goal of Therapy:  Heparin level 0.3-0.7 units/ml Monitor platelets by anticoagulation protocol: Yes   Plan:  Start heparin infusion at 600 units/hr at 7:30PM Check heparin level at 6 hours Daily CBC   Vallery Sa, PharmD Candidate 11/24/2018,2:00 PM

## 2018-11-24 NOTE — Op Note (Signed)
11/24/2018  12:19 PM  PATIENT:  Gabrielle Page  58 y.o. female with a history of non-STEMI having DES PCI to LAD-diagonal back in September 2018.  Relook cath in February 2019 for unstable angina showed progression of disease upstream from the LAD stent.  This was felt to be not approachable from PCI standpoint and she was therefore referred for CABG. she had CABG x3 with LIMA-LAD, SVG-D1 and SVG-OM.  Unfortunately over the last several months she has been having progressively worsening dyspnea and some chest discomfort.  These symptoms have progressed despite diuresis for possible diastolic heart failure.  Echocardiogram was relatively normal.  Therefore Dr. Bettina Gavia felt it prudent to send the patient a right and left heart catheterization and possible PCI.  PRE-OPERATIVE DIAGNOSIS:  Atypical Angina - Dyspnea, CAD-CABG x 3, Chronic Diastolic CHF  POST-OPERATIVE DIAGNOSIS:   Periprocedure Type 4 MI -with now occluded occlusion of native OM1 at anastomosis site.  Attempted PTCA of 99% lesion, unable to advance wire beyond the lesion likely due to suture stricture.  With wire removal there was now acute occlusion of the downstream OM.  Occluded SVG-OM and SVG-diagonal  Patent but very small LIMA-LAD with competitive flow to the distal LAD.  Likely significant ostial LAD lesion but with TIMI-3 flow in the diagonal branch.  The LAD itself tapers to a relatively small caliber distally with competitive flow.  The stents itself is widely patent  Totally normal right heart cath pressures with no evidence of CHF.  Right atrial, RVEDP, LVEDP and PCWP pressures are all normal.  Mildly reduced cardiac output of 2.44 with an index of 2.91.  PROCEDURE:  Procedure(s): RIGHT/LEFT HEART CATH AND CORONARY/GRAFT ANGIOGRAPHY (N/A) INVASIVE LAB ABORTED CASE => unable to advance wire past 99% lesion leading to 100% occlusion.  No balloon inflated despite balloon being advanced.  SURGEON:  Surgeon(s) and  Role:    * Leonie Man, MD - Primary  ANESTHESIA:   regional, IV sedation and -2 mL for radial, 1 mL for brachial access.  4 mg IV Versed, 125 mcg IV fentanyl and 2 mg IV morphine.  EBL:  <50 mL   Time Out: Verified patient identification, verified procedure, site/side was marked, verified correct patient position, special equipment/implants available, medications/allergies/relevent history reviewed, required imaging and test results available. Performed.  During this procedure the patient is administered a total of Versed 4 mg and Fentanyl 125 mg & 2 mg IV Morphine to achieve and maintain moderate conscious sedation.  The patient's heart rate, blood pressure, and oxygen saturation are monitored continuously during the procedure. The period of conscious sedation is 127 minutes, of which I was present face-to-face 100% of this time.  Access:  LEFT Radial Artery: 6 Fr sheath -- Seldinger technique using Angiocath Micropuncture Kit 10 mL radial cocktail IA; 3000 Units IV Heparin.. * Right Brachial/Antecubital Vein: The existing 18-gauge IV was exchanged over a wire for a 5Fr sheath  Right Heart Catheterization: 5 Fr Gordy Councilman catheter advanced under fluoroscopy with balloon inflated to the RA, RV, then PCWP-PA for hemodynamic measurement.  * Simultaneous FA & PA blood gases checked for SaO2% to calculate FICK CO/CI  * Catheter removed completely out of the body with balloon deflated.  Left Heart Catheterization: 5 Fr Catheters advanced or exchanged over a J-wire under direct fluoroscopic guidance into the ascending aorta; JR4 catheter advanced first.  * LV Hemodynamics (No LV Gram): JR4 Catheter * Left Coronary Artery Cineangiography: JL3.5 Catheter  * Right Coronary Artery  SVG-D1 & SVG-OM Cineangiography: JR4  Catheter  * LIMA-LAD Cineangiography: jr4 Catheter redirected into Left Subclavian Artery & exchanged over long-exchange wire for IMA catheter.  Initial angiography revealed  occluded vein grafts with patent LIMA-LAD.  The LAD looks relatively similar to the pre-CABG images and has near ostial stenosis but TIMI-3 flow.  The more likely culprit is the relatively normal circumflex-OM1 that now has an anastomotic 99% lesion in a very sharp bend.  Preparations are made for attempted PCI.  Attempted PCI OM1: 5 French EBU 3.0 guide catheter seated with some difficulty.  Multiple wires including prowater, run-through, Choice PT2.  A 2.0 mL x 12 mm balloon was used for wire support.  Unfortunately each wire easily navigated into the OM but was unable to make the sharp 90 degree turn into the native vessel beyond the graft.  The wires would hit the lesion and deflect into the graft despite balloon support.  The Choice PT came the closest to crossing and actually entered the lesion but would not cross. ==> Unfortunately, when this wire was pulled back, the neck 99% lesion was now 100% total occlusion with no flow distally beyond the anastomosis. --> At this point having tried multiple wires with multiple different attempts, I felt that further attempts were futile.  Most likely she will have an occluded OM 1 (that was otherwise normal prior to grafting).    Upon completion of Angiogaphy, the catheter was removed completely out of the body over a wire.  Brachial Sheath(s) removed in the Cath Lab with manual pressure for hemostasis.    Radial sheath removed in the Cardiac Catheterization lab with TR Band placed for hemostasis.  TR Band: 1210 Hours; 13 mL air  MEDICATIONS * SQ Lidocaine 48m * Radial Cocktail: 3 mg Verapmil in 10 mL NS * Isovue Contrast: Total 150 mL * Heparin: 6000 units  MEDICATIONS USED: Pepcid 20 mg IV, Plavix 60 mg p.o., Maalox solution -  COUNTS:  YES  DICTATION: .Note written in EPIC  PLAN OF CARE: Cycle troponin levels.  We will start IV heparin 6 hours after sheath removal.  If blood pressure tolerates, would consider nitroglycerin.  But have written  for both oral and IV pain medications to allow her to complete her infarct. --Unfortunately, she would likely complete a Type 4a MI -> progression of 99% to 100% occlusion of OM1.  PATIENT DISPOSITION:  PACU - hemodynamically stable.   Delay start of Pharmacological VTE agent (>24hrs) due to surgical blood loss or risk of bleeding: no   DGlenetta Hew M.D., M.S. Interventional Cardiologist   Pager # [phone] # [phone] [address] [place] [id number]

## 2018-11-24 NOTE — Interval H&P Note (Signed)
History and Physical Interval Note:  11/24/2018 9:40 AM  Hughie Closs  has presented today for surgery, with the diagnosis of cp /  Sob -? CHF.  The various methods of treatment have been discussed with the patient and family. After consideration of risks, benefits and other options for treatment, the patient has consented to  Procedure(s): RIGHT/LEFT HEART CATH AND CORONARY/GRAFT ANGIOGRAPHY (N/A)  PERCUTANEOUS CORONARY INTERVENTION  as a surgical intervention.  The patient's history has been reviewed, patient examined, no change in status, stable for surgery.  I have reviewed the patient's chart and labs.  Questions were answered to the patient's satisfaction.    Cath Lab Visit (complete for each Cath Lab visit)  Clinical Evaluation Leading to the Procedure:   ACS: No.  Non-ACS:    Anginal Classification: CCS III -NYHA-III CHF  Anti-ischemic medical therapy: Maximal Therapy (2 or more classes of medications)  Non-Invasive Test Results: No non-invasive testing performed  Prior CABG: No previous CABG  [name]

## 2018-11-24 NOTE — Progress Notes (Signed)
MRSA PCR result positive.  MRSA order set started/ordered per protocol.  Education done with patient.

## 2018-11-25 ENCOUNTER — Encounter (HOSPITAL_COMMUNITY): Payer: Self-pay | Admitting: Cardiology

## 2018-11-25 ENCOUNTER — Inpatient Hospital Stay (HOSPITAL_COMMUNITY): Payer: Self-pay

## 2018-11-25 DIAGNOSIS — I5032 Chronic diastolic (congestive) heart failure: Secondary | ICD-10-CM

## 2018-11-25 DIAGNOSIS — I340 Nonrheumatic mitral (valve) insufficiency: Secondary | ICD-10-CM

## 2018-11-25 DIAGNOSIS — I2119 ST elevation (STEMI) myocardial infarction involving other coronary artery of inferior wall: Principal | ICD-10-CM

## 2018-11-25 DIAGNOSIS — E876 Hypokalemia: Secondary | ICD-10-CM

## 2018-11-25 DIAGNOSIS — I361 Nonrheumatic tricuspid (valve) insufficiency: Secondary | ICD-10-CM

## 2018-11-25 LAB — CBC
HCT: 33.8 % — ABNORMAL LOW (ref 36.0–46.0)
Hemoglobin: 11.5 g/dL — ABNORMAL LOW (ref 12.0–15.0)
MCH: 31.4 pg (ref 26.0–34.0)
MCHC: 34 g/dL (ref 30.0–36.0)
MCV: 92.3 fL (ref 80.0–100.0)
Platelets: 240 10*3/uL (ref 150–400)
RBC: 3.66 MIL/uL — ABNORMAL LOW (ref 3.87–5.11)
RDW: 13.5 % (ref 11.5–15.5)
WBC: 5 10*3/uL (ref 4.0–10.5)
nRBC: 0 % (ref 0.0–0.2)

## 2018-11-25 LAB — ECHOCARDIOGRAM COMPLETE
Height: 63 in
Weight: 1943.58 oz

## 2018-11-25 LAB — BASIC METABOLIC PANEL
Anion gap: 9 (ref 5–15)
BUN: 22 mg/dL — ABNORMAL HIGH (ref 6–20)
CO2: 19 mmol/L — ABNORMAL LOW (ref 22–32)
Calcium: 9 mg/dL (ref 8.9–10.3)
Chloride: 109 mmol/L (ref 98–111)
Creatinine, Ser: 0.96 mg/dL (ref 0.44–1.00)
GFR calc Af Amer: >60 mL/min (ref >60)
GFR calc non Af Amer: >60 mL/min (ref >60)
Glucose, Bld: 91 mg/dL (ref 70–99)
Potassium: 3.3 mmol/L — ABNORMAL LOW (ref 3.5–5.1)
Sodium: 137 mmol/L (ref 135–145)

## 2018-11-25 LAB — TROPONIN I (HIGH SENSITIVITY)
Troponin I (High Sensitivity): 17706 ng/L (ref <18)
Troponin I (High Sensitivity): 11046 ng/L (ref <18)

## 2018-11-25 LAB — HEPARIN LEVEL (UNFRACTIONATED)
Heparin Unfractionated: 0.9 IU/mL — ABNORMAL HIGH (ref 0.30–0.70)
Heparin Unfractionated: 0.14 IU/mL — ABNORMAL LOW (ref 0.30–0.70)
Heparin Unfractionated: 0.1 IU/mL — ABNORMAL LOW (ref 0.30–0.70)

## 2018-11-25 MED ORDER — POTASSIUM CHLORIDE CRYS ER 20 MEQ PO TBCR
40.0000 meq | EXTENDED_RELEASE_TABLET | Freq: Once | ORAL | Status: AC
  Administered 2018-11-25: 40 meq via ORAL
  Filled 2018-11-25: qty 2

## 2018-11-25 MED ORDER — SODIUM CHLORIDE 0.9 % IV SOLN
INTRAVENOUS | Status: AC
  Administered 2018-11-25 (×2): via INTRAVENOUS

## 2018-11-25 NOTE — Progress Notes (Signed)
[place] for Heparin Indication: chest pain/ACS  Allergies  Allergen Reactions  . Amiodarone Diarrhea and Nausea And Vomiting    In combination with colchicine  . Colchicine Diarrhea and Nausea And Vomiting    Pt states she had rnx to medication; severe V/D  . Compazine [Prochlorperazine Edisylate] Other (See Comments)    SEIZURE  . Other Other (See Comments)    Steroids cause uncontrollable coughing    Patient Measurements: Height: 5\' 3"  (160 cm) Weight: 121 lb 7.6 oz (55.1 kg) IBW/kg (Calculated) : 52.4 Heparin Dosing Weight: 52.2  Vital Signs: Temp: 98.4 F (36.9 C) (10/13 1240) Temp Source: Oral (10/13 1240) BP: 99/51 (10/13 0600)  Labs: Recent Labs    11/24/18 1012 11/24/18 1015  11/24/18 1635 11/24/18 1841 11/25/18 0227 11/25/18 0908 11/25/18 1205  HGB 9.9* 9.9*  --   --   --  11.5*  --   --   HCT 29.0* 29.0*  --   --   --  33.8*  --   --   PLT  --   --   --   --   --  240  --   --   HEPARINUNFRC  --   --   --   --   --  0.90*  --  0.14*  CREATININE  --   --   --   --   --  0.96  --   --   TROPONINIHS  --   --    < > 660* 3,355*  --  17,706*  --    < > = values in this interval not displayed.    Estimated Creatinine Clearance: 54.1 mL/min (by C-G formula based on SCr of 0.96 mg/dL).   Medical History: Past Medical History:  Diagnosis Date  . Asthma   . Chronic bronchitis ([place])    "usually get it q yr" (11/05/2016)  . GERD (gastroesophageal reflux disease)   . Headache    "weekly" (11/05/2016)  . Heart murmur    "dx'd when I was a child"  . Hepatitis 1991   "when I had my gallbladder out" (11/05/2016)  . History of blood transfusion 1985   S/P childbirth  . History of kidney stones   . NSTEMI (non-ST elevated myocardial infarction) ([place]) 11/05/2016   9/18 PCI/DESx1 to p/mLAD  . Pancreatitis 1991  . Pneumonia    "twice" (11/05/2016)    Medications:  Medications Prior to Admission  Medication Sig  Dispense Refill Last Dose  . acetaminophen (TYLENOL) 325 MG tablet Take 325 mg by mouth every 6 (six) hours as needed for moderate pain or headache.    11/23/2018 at Unknown time  . aspirin EC 81 MG tablet Take 81 mg by mouth daily.   11/24/2018 at 0500  . atorvastatin (LIPITOR) 10 MG tablet Take 1 tablet (10 mg total) by mouth daily at 6 PM. 30 tablet 1 11/24/2018 at 0500  . busPIRone (BUSPAR) 5 MG tablet Take 5 mg by mouth 3 (three) times daily.   11/24/2018 at 0500  . furosemide (LASIX) 40 MG tablet Take 0.5 tablets (20 mg total) by mouth daily. 30 tablet 3 Past Week at Unknown time  . metoprolol tartrate (LOPRESSOR) 25 MG tablet Take 1 tablet (25 mg total) by mouth 2 (two) times daily. 60 tablet 1 11/24/2018 at 0500  . Multiple Vitamin (MULTIVITAMIN WITH MINERALS) TABS tablet Take 1 tablet by mouth daily.   11/24/2018 at Unknown time  .  nitroGLYCERIN (NITROSTAT) 0.4 MG SL tablet Place 0.4 mg under the tongue every 5 (five) minutes as needed for chest pain.   Past Month at Unknown time  . Omega-3 Fatty Acids (FISH OIL) 1000 MG CAPS Take 1,000 mg by mouth daily.    11/24/2018 at Unknown time  . potassium chloride SA (KLOR-CON) 20 MEQ tablet Take 20 mEq by mouth daily.   Past Week at Unknown time  . spironolactone (ALDACTONE) 25 MG tablet Take 0.5 tablets (12.5 mg total) by mouth daily. 30 tablet 3 Past Week at Unknown time  . traZODone (DESYREL) 50 MG tablet Take 50 mg by mouth at bedtime.    11/23/2018 at Unknown time  . albuterol (VENTOLIN HFA) 108 (90 Base) MCG/ACT inhaler Inhale 2 puffs into the lungs every 6 (six) hours as needed for wheezing or shortness of breath.   More than a month at Unknown time    Assessment: 36 YOF presented for cardiac cath, which was unsuccessful due to inability to cross lesion. Will medically manage with heparin while completing infarct.  Heparin resumed, now subtherapeutic. No S/Sx bleeding per RN, CBC stable.  Goal of Therapy:  Heparin level 0.3-0.7  units/ml Monitor platelets by anticoagulation protocol: Yes   Plan:  -Increase heparin to 600 units/hr -Recheck heparin level tonight   Arrie Senate, PharmD, BCPS Clinical Pharmacist [phone] Please check AMION for all [place] numbers 11/25/2018

## 2018-11-25 NOTE — Progress Notes (Signed)
  Echocardiogram 2D Echocardiogram has been performed.  Gabrielle Page L Androw 11/25/2018, 11:25 AM

## 2018-11-25 NOTE — Progress Notes (Signed)
[place] for Heparin Indication: chest pain/ACS  Allergies  Allergen Reactions  . Amiodarone Diarrhea and Nausea And Vomiting    In combination with colchicine  . Colchicine Diarrhea and Nausea And Vomiting    Pt states she had rnx to medication; severe V/D  . Compazine [Prochlorperazine Edisylate] Other (See Comments)    SEIZURE  . Other Other (See Comments)    Steroids cause uncontrollable coughing    Patient Measurements: Height: 5\' 3"  (160 cm) Weight: 121 lb 7.6 oz (55.1 kg) IBW/kg (Calculated) : 52.4 Heparin Dosing Weight: 52.2  Vital Signs: Temp: 98.6 F (37 C) (10/13 2000) Temp Source: Oral (10/13 2000) BP: 86/58 (10/13 2100) Pulse Rate: 64 (10/13 1600)  Labs: Recent Labs    11/24/18 1012 11/24/18 1015  11/24/18 1841 11/25/18 0227 11/25/18 0908 11/25/18 1205 11/25/18 2044  HGB 9.9* 9.9*  --   --  11.5*  --   --   --   HCT 29.0* 29.0*  --   --  33.8*  --   --   --   PLT  --   --   --   --  240  --   --   --   HEPARINUNFRC  --   --   --   --  0.90*  --  0.14* 0.10*  CREATININE  --   --   --   --  0.96  --   --   --   TROPONINIHS  --   --    < > 3,355*  --  17,706* 11,046*  --    < > = values in this interval not displayed.    Estimated Creatinine Clearance: 54.1 mL/min (by C-G formula based on SCr of 0.96 mg/dL).   Medical History: Past Medical History:  Diagnosis Date  . Asthma   . Chronic bronchitis ([place])    "usually get it q yr" (11/05/2016)  . GERD (gastroesophageal reflux disease)   . Headache    "weekly" (11/05/2016)  . Heart murmur    "dx'd when I was a child"  . Hepatitis 1991   "when I had my gallbladder out" (11/05/2016)  . History of blood transfusion 1985   S/P childbirth  . History of kidney stones   . NSTEMI (non-ST elevated myocardial infarction) ([place]) 11/05/2016   9/18 PCI/DESx1 to p/mLAD  . Pancreatitis 1991  . Pneumonia    "twice" (11/05/2016)    Medications:  Medications Prior to  Admission  Medication Sig Dispense Refill Last Dose  . acetaminophen (TYLENOL) 325 MG tablet Take 325 mg by mouth every 6 (six) hours as needed for moderate pain or headache.    11/23/2018 at Unknown time  . aspirin EC 81 MG tablet Take 81 mg by mouth daily.   11/24/2018 at 0500  . atorvastatin (LIPITOR) 10 MG tablet Take 1 tablet (10 mg total) by mouth daily at 6 PM. 30 tablet 1 11/24/2018 at 0500  . busPIRone (BUSPAR) 5 MG tablet Take 5 mg by mouth 3 (three) times daily.   11/24/2018 at 0500  . furosemide (LASIX) 40 MG tablet Take 0.5 tablets (20 mg total) by mouth daily. 30 tablet 3 Past Week at Unknown time  . metoprolol tartrate (LOPRESSOR) 25 MG tablet Take 1 tablet (25 mg total) by mouth 2 (two) times daily. 60 tablet 1 11/24/2018 at 0500  . Multiple Vitamin (MULTIVITAMIN WITH MINERALS) TABS tablet Take 1 tablet by mouth daily.   11/24/2018 at Unknown  time  . nitroGLYCERIN (NITROSTAT) 0.4 MG SL tablet Place 0.4 mg under the tongue every 5 (five) minutes as needed for chest pain.   Past Month at Unknown time  . Omega-3 Fatty Acids (FISH OIL) 1000 MG CAPS Take 1,000 mg by mouth daily.    11/24/2018 at Unknown time  . potassium chloride SA (KLOR-CON) 20 MEQ tablet Take 20 mEq by mouth daily.   Past Week at Unknown time  . spironolactone (ALDACTONE) 25 MG tablet Take 0.5 tablets (12.5 mg total) by mouth daily. 30 tablet 3 Past Week at Unknown time  . traZODone (DESYREL) 50 MG tablet Take 50 mg by mouth at bedtime.    11/23/2018 at Unknown time  . albuterol (VENTOLIN HFA) 108 (90 Base) MCG/ACT inhaler Inhale 2 puffs into the lungs every 6 (six) hours as needed for wheezing or shortness of breath.   More than a month at Unknown time    Assessment: 83 YOF presented for cardiac cath, which was unsuccessful due to inability to cross lesion. Will medically manage with heparin while completing infarct.  Heparin resumed, still subtherapeutic. No S/Sx bleeding per RN, CBC stable.  Goal of Therapy:   Heparin level 0.3-0.7 units/ml Monitor platelets by anticoagulation protocol: Yes   Plan:  -Increase heparin to 750 units/hr -Recheck heparin level in am  Erin Hearing PharmD., BCPS Clinical Pharmacist 11/25/2018 9:29 PM

## 2018-11-25 NOTE — Progress Notes (Signed)
[phone] MI education and CHF education completed with pt except for ex ed. Did not walk pt as she just had pain med and she had 4 to 5 beats VT while lying in bed and we were talking. Asymptomatic. Staff can get pt to chair later. Discussed daily weights, 2000 mg sodium restriction, 2 LFR, MI restrictions, NTG use, watching sodium and heart healthy food choices, CRP 2. Referred to [facility] CRP 2. Pt stated unable to attend last referral due to cost. Gave pt CHF and MI booklets. Reviewed signs/symptoms of CHF and when to call MD. Pt stated she was already weighing daily. Will follow up tomorrow. Graylon Good RN BSN 11/25/2018 9:43 AM

## 2018-11-25 NOTE — Progress Notes (Addendum)
Progress Note  Patient Name: Gabrielle Page Date of Encounter: 11/25/2018  Primary Cardiologist: Dr. Bettina Gavia  Subjective   Feels better but mild residual chest discomfort, improved from post procedure  Inpatient Medications    Scheduled Meds:  aspirin EC  81 mg Oral Daily   atorvastatin  10 mg Oral q1800   busPIRone  5 mg Oral TID   Chlorhexidine Gluconate Cloth  6 each Topical Daily   Chlorhexidine Gluconate Cloth  6 each Topical Q0600   clopidogrel  75 mg Oral Q breakfast   metoprolol tartrate  25 mg Oral BID   mupirocin ointment  1 application Nasal BID   sodium chloride flush  3 mL Intravenous Q12H   traZODone  50 mg Oral QHS   Continuous Infusions:  sodium chloride 10 mL/hr at 11/25/18 0500   heparin 500 Units/hr (11/25/18 0500)   PRN Meds: sodium chloride, acetaminophen, albuterol, HYDROmorphone (DILAUDID) injection, ondansetron (ZOFRAN) IV, oxyCODONE, sodium chloride flush   Vital Signs    Vitals:   11/25/18 0500 11/25/18 0530 11/25/18 0600 11/25/18 0755  BP: (!) 99/59 (!) 106/55 (!) 99/51   Pulse:      Resp: 16 13 15    Temp:    (!) 97.4 F (36.3 C)  TempSrc:    Oral  SpO2: 100%  100%   Weight: 55.1 kg     Height:        Intake/Output Summary (Last 24 hours) at 11/25/2018 0802 Last data filed at 11/25/2018 0500 Gross per 24 hour  Intake 926.37 ml  Output 850 ml  Net 76.37 ml    I/O since admission: +76  Filed Weights   11/24/18 0708 11/25/18 0500  Weight: 52.2 kg 55.1 kg    Telemetry    Sinus - Personally Reviewed  ECG    ECG (independently read by me): NSR at 62; LBBB  Physical Exam   BP (!) 99/51    Pulse 66    Temp (!) 97.4 F (36.3 C) (Oral)    Resp 15    Ht 5\' 3"  (1.6 m)    Wt 55.1 kg    SpO2 100%    BMI 21.52 kg/m  General: Alert, oriented, no distress.  Skin: normal turgor, no rashes, warm and dry HEENT: Normocephalic, atraumatic. Pupils equal round and reactive to light; sclera anicteric; extraocular  muscles intact; Nose without nasal septal hypertrophy Mouth/Parynx benign; Mallinpatti scale 3 Neck: No JVD, no carotid bruits; normal carotid upstroke Lungs: clear to ausculatation and percussion; no wheezing or rales Chest wall: without tenderness to palpitation Heart: PMI not displaced, RRR, s1 s2 normal, 1/6 systolic murmur, no diastolic murmur, no rubs, gallops, thrills, or heaves Abdomen: soft, nontender; no hepatosplenomehaly, BS+; abdominal aorta nontender and not dilated by palpation. Back: no CVA tenderness Pulses 2+: R radial site stable Musculoskeletal: full range of motion, normal strength, no joint deformities Extremities: no clubbing cyanosis or edema, Homan's sign negative  Neurologic: grossly nonfocal; Cranial nerves grossly wnl Psychologic: Normal mood and affect   Labs    Chemistry Recent Labs  Lab 11/24/18 1012 11/24/18 1015 11/25/18 0227  NA 141 140 137  K 2.9* 2.9* 3.3*  CL  --   --  109  CO2  --   --  19*  GLUCOSE  --   --  91  BUN  --   --  22*  CREATININE  --   --  0.96  CALCIUM  --   --  9.0  GFRNONAA  --   --  >  58  GFRAA  --   --  >60  ANIONGAP  --   --  9     Hematology Recent Labs  Lab 11/24/18 1012 11/24/18 1015 11/25/18 0227  WBC  --   --  5.0  RBC  --   --  3.66*  HGB 9.9* 9.9* 11.5*  HCT 29.0* 29.0* 33.8*  MCV  --   --  92.3  MCH  --   --  31.4  MCHC  --   --  34.0  RDW  --   --  13.5  PLT  --   --  240    Cardiac EnzymesNo results for input(s): TROPONINI in the last 168 hours. No results for input(s): TROPIPOC in the last 168 hours.   BNPNo results for input(s): BNP, PROBNP in the last 168 hours.   DDimer No results for input(s): DDIMER in the last 168 hours.   HS troponin: 3355  Lipid Panel     Component Value Date/Time   CHOL 75 04/08/2017 0440   TRIG 107 04/08/2017 0440   HDL 20 (L) 04/08/2017 0440   CHOLHDL 3.8 04/08/2017 0440   VLDL 21 04/08/2017 0440   LDLCALC 34 04/08/2017 0440     Radiology    No  results found.  Cardiac Studies    Dist LM lesion is 40% stenosed.  Ost LAD lesion is 80% stenosed. -As noted by Dr. [place] and regional cath  Prox LAD to Mid LAD stent across the diagonal is widely patent  Mid LAD-1 lesion is 60% stenosed. Mid LAD-2 lesion is 80% stenosed.  Ost 1st Diag lesion is 60% stenosed.  LIMA graft was visualized by angiography and is small -to a relatively small caliber downstream LAD.  SVG-OM1 graft was visualized by angiography and is normal in caliber. Origin to Prox Graft lesion is 100% stenosed.  SVG-1ST DIAG graft was visualized by angiography and is normal in caliber. Origin to Prox Graft lesion is 100% stenosed.  1st Mrg lesion is 99% stenosed at the graft insertion site  Balloon angioplasty was attempted, lesion could not be crossed. Unfortunately lesion progressed.  Post intervention, there is a 100% residual stenosis. After multiple attempts to cross, procedure aborted.  ------------------------------------------  LV end diastolic pressure is normal. Right heart pressures normal.  Mildly reduced cardiac output/index   SUMMARY  PeriprocedureType 4 MI -with now occluded occlusion of native OM1 at anastomosis site. Attempted PTCA of 99% lesion, unable to advance wire beyond the lesion likely due to suture stricture. With wire removal there was now acute occlusion of the downstream OM.  Occluded SVG-OM and SVG-diagonal  Patent but very small LIMA-LAD with competitive flow to the distal LAD.  Likely significant ostial LAD lesion but with TIMI-3 flow in the diagonal branch. The LAD itself tapers to a relatively small caliber distally with competitive flow. The stents itself is widely patent  Totally normal right heart cath pressures with no evidence of CHF. Right atrial, RVEDP, LVEDP and PCWP pressures are all normal.  Mildly reduced cardiac output of 2.44 with an index of 2.91.    RECOMMENDATIONS:   Unfortunately it seems like  the patient will likely complete an inferolateral infarct with occluded OM.  She will be admitted to the ICU for management.  We will start IV heparin 6 hours post sheath removal.  If blood pressure tolerates, will use IV nitroglycerin, but otherwise will use IV and oral narcotics for pain control.  Cycle troponin levels.  Restart most  home medications with exception of Lasix.  I suspect that she should be in the hospital least 2 to 3 days to for completion of her infarct.  Monitor for signs of hypotension etc.  [name], MD      Patient Profile     Gabrielle Page  58 y.o. female with a history of non-STEMI having DES PCI to LAD-diagonal back in September 2018.  Relook cath in February 2019 for unstable angina showed progression of disease upstream from the LAD stent.  This was felt to be not approachable from PCI standpoint and she was therefore referred for CABG. she had CABG x3 with LIMA-LAD, SVG-D1 and SVG-OM. Over the last several months she has been having progressively worsening dyspnea and some chest discomfort.  These symptoms have progressed despite diuresis for possible diastolic heart failure.   Assessment & Plan    1. NSTEMI: with SVG to OM occcluded attempt at opening 99% OM vessel   with inability to navigate sharp bend at prior graft anastamosis resulted in total occlusion and inferolateral MI.  HS trop 3355, will repeat.  Residual mild chest discomfort; BP is low precluding med titration. On heparin. Will check echo today. On ASA/Plavix and low dose BB.  2. LBBB: stable  3. Hypokalemia: K 3.3; replete  4. Low BP:  Will increase hydration today; not eating well.   5. Hyperlipidemia:  Re-check lipid studies in am; had eaten a little this am.   6. Anemia:  H/H 11.5/33.8.   Keep in [place] today.  Signed, Troy Sine, MD, [facility] 11/25/2018, 8:02 AM

## 2018-11-25 NOTE — Progress Notes (Signed)
[place] for Heparin Indication: chest pain/ACS  Allergies  Allergen Reactions  . Amiodarone Diarrhea and Nausea And Vomiting    In combination with colchicine  . Colchicine Diarrhea and Nausea And Vomiting    Pt states she had rnx to medication; severe V/D  . Compazine [Prochlorperazine Edisylate] Other (See Comments)    SEIZURE  . Other Other (See Comments)    Steroids cause uncontrollable coughing    Patient Measurements: Height: 5\' 3"  (160 cm) Weight: 115 lb (52.2 kg) IBW/kg (Calculated) : 52.4 Heparin Dosing Weight: 52.2  Vital Signs: Temp: 97.7 F (36.5 C) (10/13 0011) Temp Source: Oral (10/13 0011) BP: 100/57 (10/13 0100)  Labs: Recent Labs    11/24/18 1012 11/24/18 1015 11/24/18 1414 11/24/18 1635 11/24/18 1841 11/25/18 0227  HGB 9.9* 9.9*  --   --   --  11.5*  HCT 29.0* 29.0*  --   --   --  33.8*  PLT  --   --   --   --   --  240  HEPARINUNFRC  --   --   --   --   --  0.90*  CREATININE  --   --   --   --   --  0.96  TROPONINIHS  --   --  39* 660* 3,355*  --     Estimated Creatinine Clearance: 53.9 mL/min (by C-G formula based on SCr of 0.96 mg/dL).   Medical History: Past Medical History:  Diagnosis Date  . Asthma   . Chronic bronchitis ([place])    "usually get it q yr" (11/05/2016)  . GERD (gastroesophageal reflux disease)   . Headache    "weekly" (11/05/2016)  . Heart murmur    "dx'd when I was a child"  . Hepatitis 1991   "when I had my gallbladder out" (11/05/2016)  . History of blood transfusion 1985   S/P childbirth  . History of kidney stones   . NSTEMI (non-ST elevated myocardial infarction) ([place]) 11/05/2016   9/18 PCI/DESx1 to p/mLAD  . Pancreatitis 1991  . Pneumonia    "twice" (11/05/2016)    Medications:  Medications Prior to Admission  Medication Sig Dispense Refill Last Dose  . acetaminophen (TYLENOL) 325 MG tablet Take 325 mg by mouth every 6 (six) hours as needed for moderate pain or  headache.    11/23/2018 at Unknown time  . aspirin EC 81 MG tablet Take 81 mg by mouth daily.   11/24/2018 at 0500  . atorvastatin (LIPITOR) 10 MG tablet Take 1 tablet (10 mg total) by mouth daily at 6 PM. 30 tablet 1 11/24/2018 at 0500  . busPIRone (BUSPAR) 5 MG tablet Take 5 mg by mouth 3 (three) times daily.   11/24/2018 at 0500  . furosemide (LASIX) 40 MG tablet Take 0.5 tablets (20 mg total) by mouth daily. 30 tablet 3 Past Week at Unknown time  . metoprolol tartrate (LOPRESSOR) 25 MG tablet Take 1 tablet (25 mg total) by mouth 2 (two) times daily. 60 tablet 1 11/24/2018 at 0500  . Multiple Vitamin (MULTIVITAMIN WITH MINERALS) TABS tablet Take 1 tablet by mouth daily.   11/24/2018 at Unknown time  . nitroGLYCERIN (NITROSTAT) 0.4 MG SL tablet Place 0.4 mg under the tongue every 5 (five) minutes as needed for chest pain.   Past Month at Unknown time  . Omega-3 Fatty Acids (FISH OIL) 1000 MG CAPS Take 1,000 mg by mouth daily.    11/24/2018 at Unknown time  .  potassium chloride SA (KLOR-CON) 20 MEQ tablet Take 20 mEq by mouth daily.   Past Week at Unknown time  . spironolactone (ALDACTONE) 25 MG tablet Take 0.5 tablets (12.5 mg total) by mouth daily. 30 tablet 3 Past Week at Unknown time  . traZODone (DESYREL) 50 MG tablet Take 50 mg by mouth at bedtime.    11/23/2018 at Unknown time  . albuterol (VENTOLIN HFA) 108 (90 Base) MCG/ACT inhaler Inhale 2 puffs into the lungs every 6 (six) hours as needed for wheezing or shortness of breath.   More than a month at Unknown time    Assessment: 36 YOF presented for cardiac cath, which was unsuccessful due to inability to cross lesion. Will medically manage with heparin while completing infarct. - Start heparin 6 hours after sheath removal. - Not on anticoagulation prior to admission.  10/13 AM update:  Initial heparin level above goal CBC stable  Goal of Therapy:  Heparin level 0.3-0.7 units/ml Monitor platelets by anticoagulation protocol: Yes    Plan:  -Dec heparin to 500 units/hr -Re-check heparin level at [place], PharmD, [place] Pharmacist Phone: [phone]

## 2018-11-26 ENCOUNTER — Other Ambulatory Visit: Payer: Self-pay

## 2018-11-26 ENCOUNTER — Encounter (HOSPITAL_COMMUNITY): Payer: Self-pay

## 2018-11-26 DIAGNOSIS — E782 Mixed hyperlipidemia: Secondary | ICD-10-CM

## 2018-11-26 DIAGNOSIS — D649 Anemia, unspecified: Secondary | ICD-10-CM

## 2018-11-26 LAB — CBC
HCT: 27.8 % — ABNORMAL LOW (ref 36.0–46.0)
Hemoglobin: 9.4 g/dL — ABNORMAL LOW (ref 12.0–15.0)
MCH: 31.6 pg (ref 26.0–34.0)
MCHC: 33.8 g/dL (ref 30.0–36.0)
MCV: 93.6 fL (ref 80.0–100.0)
Platelets: 250 10*3/uL (ref 150–400)
RBC: 2.97 MIL/uL — ABNORMAL LOW (ref 3.87–5.11)
RDW: 14.1 % (ref 11.5–15.5)
WBC: 5.4 10*3/uL (ref 4.0–10.5)
nRBC: 0 % (ref 0.0–0.2)

## 2018-11-26 LAB — BASIC METABOLIC PANEL
Anion gap: 7 (ref 5–15)
BUN: 12 mg/dL (ref 6–20)
CO2: 19 mmol/L — ABNORMAL LOW (ref 22–32)
Calcium: 8.9 mg/dL (ref 8.9–10.3)
Chloride: 111 mmol/L (ref 98–111)
Creatinine, Ser: 0.71 mg/dL (ref 0.44–1.00)
GFR calc Af Amer: >60 mL/min (ref >60)
GFR calc non Af Amer: >60 mL/min (ref >60)
Glucose, Bld: 95 mg/dL (ref 70–99)
Potassium: 3.9 mmol/L (ref 3.5–5.1)
Sodium: 137 mmol/L (ref 135–145)

## 2018-11-26 LAB — LIPID PANEL
Cholesterol: 145 mg/dL (ref 0–200)
HDL: 30 mg/dL — ABNORMAL LOW (ref >40)
LDL Cholesterol: 70 mg/dL (ref 0–99)
Total CHOL/HDL Ratio: 4.8 RATIO
Triglycerides: 225 mg/dL — ABNORMAL HIGH (ref <150)
VLDL: 45 mg/dL — ABNORMAL HIGH (ref 0–40)

## 2018-11-26 LAB — HEPARIN LEVEL (UNFRACTIONATED): Heparin Unfractionated: 0.34 IU/mL (ref 0.30–0.70)

## 2018-11-26 MED ORDER — BISACODYL 10 MG RE SUPP
10.0000 mg | Freq: Every day | RECTAL | Status: DC | PRN
  Administered 2018-11-26: 10 mg via RECTAL
  Filled 2018-11-26: qty 1

## 2018-11-26 MED ORDER — INFLUENZA VAC SPLIT QUAD 0.5 ML IM SUSY
0.5000 mL | PREFILLED_SYRINGE | INTRAMUSCULAR | Status: AC
  Administered 2018-11-27: 0.5 mL via INTRAMUSCULAR
  Filled 2018-11-26: qty 0.5

## 2018-11-26 MED ORDER — ATORVASTATIN CALCIUM 80 MG PO TABS
80.0000 mg | ORAL_TABLET | Freq: Every day | ORAL | Status: DC
  Administered 2018-11-26: 80 mg via ORAL
  Filled 2018-11-26: qty 1

## 2018-11-26 NOTE — Progress Notes (Signed)
[place] for Heparin Indication: chest pain/ACS  Allergies  Allergen Reactions  . Amiodarone Diarrhea and Nausea And Vomiting    In combination with colchicine  . Colchicine Diarrhea and Nausea And Vomiting    Pt states she had rnx to medication; severe V/D  . Compazine [Prochlorperazine Edisylate] Other (See Comments)    SEIZURE  . Other Other (See Comments)    Steroids cause uncontrollable coughing    Patient Measurements: Height: 5\' 3"  (160 cm) Weight: 123 lb 7.3 oz (56 kg) IBW/kg (Calculated) : 52.4 Heparin Dosing Weight: 52.2  Vital Signs: Temp: 98.5 F (36.9 C) (10/14 0400) Temp Source: Oral (10/14 0400) BP: 84/53 (10/14 0400)  Labs: Recent Labs    11/24/18 1015  11/24/18 1841  11/25/18 0227 11/25/18 0908 11/25/18 1205 11/25/18 2044 11/26/18 0452  HGB 9.9*  --   --   --  11.5*  --   --   --  9.4*  HCT 29.0*  --   --   --  33.8*  --   --   --  27.8*  PLT  --   --   --   --  240  --   --   --  250  HEPARINUNFRC  --   --   --    < > 0.90*  --  0.14* 0.10* 0.34  CREATININE  --   --   --   --  0.96  --   --   --  0.71  TROPONINIHS  --    < > 3,355*  --   --  17,706* 11,046*  --   --    < > = values in this interval not displayed.    Estimated Creatinine Clearance: 65 mL/min (by C-G formula based on SCr of 0.71 mg/dL).   Medical History: Past Medical History:  Diagnosis Date  . Asthma   . Chronic bronchitis ([place])    "usually get it q yr" (11/05/2016)  . GERD (gastroesophageal reflux disease)   . Headache    "weekly" (11/05/2016)  . Heart murmur    "dx'd when I was a child"  . Hepatitis 1991   "when I had my gallbladder out" (11/05/2016)  . History of blood transfusion 1985   S/P childbirth  . History of kidney stones   . NSTEMI (non-ST elevated myocardial infarction) ([place]) 11/05/2016   9/18 PCI/DESx1 to p/mLAD  . Pancreatitis 1991  . Pneumonia    "twice" (11/05/2016)    Medications:  Medications Prior to  Admission  Medication Sig Dispense Refill Last Dose  . acetaminophen (TYLENOL) 325 MG tablet Take 325 mg by mouth every 6 (six) hours as needed for moderate pain or headache.    11/23/2018 at Unknown time  . aspirin EC 81 MG tablet Take 81 mg by mouth daily.   11/24/2018 at 0500  . atorvastatin (LIPITOR) 10 MG tablet Take 1 tablet (10 mg total) by mouth daily at 6 PM. 30 tablet 1 11/24/2018 at 0500  . busPIRone (BUSPAR) 5 MG tablet Take 5 mg by mouth 3 (three) times daily.   11/24/2018 at 0500  . furosemide (LASIX) 40 MG tablet Take 0.5 tablets (20 mg total) by mouth daily. 30 tablet 3 Past Week at Unknown time  . metoprolol tartrate (LOPRESSOR) 25 MG tablet Take 1 tablet (25 mg total) by mouth 2 (two) times daily. 60 tablet 1 11/24/2018 at 0500  . Multiple Vitamin (MULTIVITAMIN WITH MINERALS) TABS tablet Take 1  tablet by mouth daily.   11/24/2018 at Unknown time  . nitroGLYCERIN (NITROSTAT) 0.4 MG SL tablet Place 0.4 mg under the tongue every 5 (five) minutes as needed for chest pain.   Past Month at Unknown time  . Omega-3 Fatty Acids (FISH OIL) 1000 MG CAPS Take 1,000 mg by mouth daily.    11/24/2018 at Unknown time  . potassium chloride SA (KLOR-CON) 20 MEQ tablet Take 20 mEq by mouth daily.   Past Week at Unknown time  . spironolactone (ALDACTONE) 25 MG tablet Take 0.5 tablets (12.5 mg total) by mouth daily. 30 tablet 3 Past Week at Unknown time  . traZODone (DESYREL) 50 MG tablet Take 50 mg by mouth at bedtime.    11/23/2018 at Unknown time  . albuterol (VENTOLIN HFA) 108 (90 Base) MCG/ACT inhaler Inhale 2 puffs into the lungs every 6 (six) hours as needed for wheezing or shortness of breath.   More than a month at Unknown time    Assessment: 18 YOF presented for cardiac cath, which was unsuccessful due to inability to cross lesion. Will medically manage with heparin while completing infarct. - Start heparin 6 hours after sheath removal. - Not on anticoagulation prior to admission.  10/14  AM update:  Heparin level therapeutic x 1 after rate increase  Goal of Therapy:  Heparin level 0.3-0.7 units/ml Monitor platelets by anticoagulation protocol: Yes   Plan:  -Cont heparin at 750 units/hr -Confirmatory heparin level at [place], PharmD, [place] Pharmacist Phone: [phone]

## 2018-11-26 NOTE — Progress Notes (Signed)
Progress Note  Patient Name: Gabrielle Page Date of Encounter: 11/26/2018  Primary Cardiologist: Dr. Bettina Gavia  Subjective   Feels much better today.  No residual chest tightness  Inpatient Medications    Scheduled Meds:  aspirin EC  81 mg Oral Daily   atorvastatin  80 mg Oral q1800   busPIRone  5 mg Oral TID   Chlorhexidine Gluconate Cloth  6 each Topical Daily   Chlorhexidine Gluconate Cloth  6 each Topical Q0600   clopidogrel  75 mg Oral Q breakfast   [START ON 11/27/2018] influenza vac split quadrivalent PF  0.5 mL Intramuscular Tomorrow-1000   metoprolol tartrate  25 mg Oral BID   mupirocin ointment  1 application Nasal BID   sodium chloride flush  3 mL Intravenous Q12H   traZODone  50 mg Oral QHS   Continuous Infusions:  sodium chloride 5 mL/hr at 11/26/18 1000   heparin 750 Units/hr (11/26/18 1000)   PRN Meds: sodium chloride, acetaminophen, albuterol, HYDROmorphone (DILAUDID) injection, ondansetron (ZOFRAN) IV, oxyCODONE, sodium chloride flush   Vital Signs    Vitals:   11/26/18 0605 11/26/18 0610 11/26/18 0800 11/26/18 1000  BP:  (!) 81/56 (!) 92/54 (!) 81/55  Pulse:   66 65  Resp: 14 11 (!) 21 19  Temp:   98.2 F (36.8 C)   TempSrc:   Oral   SpO2: 100% 100% 99% 100%  Weight:      Height:        Intake/Output Summary (Last 24 hours) at 11/26/2018 1123 Last data filed at 11/26/2018 1000 Gross per 24 hour  Intake 716.66 ml  Output --  Net 716.66 ml    I/O since admission: +904  Filed Weights   11/24/18 0708 11/25/18 0500 11/26/18 0400  Weight: 52.2 kg 55.1 kg 56 kg    Telemetry    Sinus - Personally Reviewed  ECG    11/26/2018 ECG (independently read by me): Normal sinus rhythm at 66 bpm.  Left bundle branch block.  QTc interval 484 msec  11/25/2018 ECG (independently read by me): NSR at 62; LBBB  Physical Exam   BP (!) 81/55 (BP Location: Left Arm)    Pulse 65    Temp 98.2 F (36.8 C) (Oral)    Resp 19    Ht 5\' 3"   (1.6 m)    Wt 56 kg    SpO2 100%    BMI 21.87 kg/m  General: Alert, oriented, no distress.  Skin: normal turgor, no rashes, warm and dry HEENT: Normocephalic, atraumatic. Pupils equal round and reactive to light; sclera anicteric; extraocular muscles intact;  Nose without nasal septal hypertrophy Mouth/Parynx benign; Mallinpatti scale 3 Neck: No JVD, no carotid bruits; normal carotid upstroke Lungs: clear to ausculatation and percussion; no wheezing or rales Chest wall: without tenderness to palpitation Heart: PMI not displaced, RRR, s1 s2 normal, 1/6 systolic murmur, no diastolic murmur, no rubs, gallops, thrills, or heaves Abdomen: soft, nontender; no hepatosplenomehaly, BS+; abdominal aorta nontender and not dilated by palpation. Back: no CVA tenderness Pulses 2+ Musculoskeletal: full range of motion, normal strength, no joint deformities Extremities: no clubbing cyanosis or edema, Homan's sign negative  Neurologic: grossly nonfocal; Cranial nerves grossly wnl Psychologic: Normal mood and affect   Labs    Chemistry Recent Labs  Lab 11/24/18 1015 11/25/18 0227 11/26/18 0452  NA 140 137 137  K 2.9* 3.3* 3.9  CL  --  109 111  CO2  --  19* 19*  GLUCOSE  --  91 95  BUN  --  22* 12  CREATININE  --  0.96 0.71  CALCIUM  --  9.0 8.9  GFRNONAA  --  >60 >60  GFRAA  --  >60 >60  ANIONGAP  --  9 7     Hematology Recent Labs  Lab 11/24/18 1015 11/25/18 0227 11/26/18 0452  WBC  --  5.0 5.4  RBC  --  3.66* 2.97*  HGB 9.9* 11.5* 9.4*  HCT 29.0* 33.8* 27.8*  MCV  --  92.3 93.6  MCH  --  31.4 31.6  MCHC  --  34.0 33.8  RDW  --  13.5 14.1  PLT  --  240 250    Cardiac EnzymesNo results for input(s): TROPONINI in the last 168 hours. No results for input(s): TROPIPOC in the last 168 hours.   BNPNo results for input(s): BNP, PROBNP in the last 168 hours.   DDimer No results for input(s): DDIMER in the last 168 hours.   HS troponin: 3,355 > 17,706 > 11,046  Lipid Panel      Component Value Date/Time   CHOL 145 11/26/2018 0452   TRIG 225 (H) 11/26/2018 0452   HDL 30 (L) 11/26/2018 0452   CHOLHDL 4.8 11/26/2018 0452   VLDL 45 (H) 11/26/2018 0452   LDLCALC 70 11/26/2018 0452     Radiology    No results found.  Cardiac Studies    Dist LM lesion is 40% stenosed.  Ost LAD lesion is 80% stenosed. -As noted by Dr. [place] and regional cath  Prox LAD to Mid LAD stent across the diagonal is widely patent  Mid LAD-1 lesion is 60% stenosed. Mid LAD-2 lesion is 80% stenosed.  Ost 1st Diag lesion is 60% stenosed.  LIMA graft was visualized by angiography and is small -to a relatively small caliber downstream LAD.  SVG-OM1 graft was visualized by angiography and is normal in caliber. Origin to Prox Graft lesion is 100% stenosed.  SVG-1ST DIAG graft was visualized by angiography and is normal in caliber. Origin to Prox Graft lesion is 100% stenosed.  1st Mrg lesion is 99% stenosed at the graft insertion site  Balloon angioplasty was attempted, lesion could not be crossed. Unfortunately lesion progressed.  Post intervention, there is a 100% residual stenosis. After multiple attempts to cross, procedure aborted.  ------------------------------------------  LV end diastolic pressure is normal. Right heart pressures normal.  Mildly reduced cardiac output/index   SUMMARY  PeriprocedureType 4 MI -with now occluded occlusion of native OM1 at anastomosis site. Attempted PTCA of 99% lesion, unable to advance wire beyond the lesion likely due to suture stricture. With wire removal there was now acute occlusion of the downstream OM.  Occluded SVG-OM and SVG-diagonal  Patent but very small LIMA-LAD with competitive flow to the distal LAD.  Likely significant ostial LAD lesion but with TIMI-3 flow in the diagonal branch. The LAD itself tapers to a relatively small caliber distally with competitive flow. The stents itself is widely patent  Totally normal  right heart cath pressures with no evidence of CHF. Right atrial, RVEDP, LVEDP and PCWP pressures are all normal.  Mildly reduced cardiac output of 2.44 with an index of 2.91.    RECOMMENDATIONS:   Unfortunately it seems like the patient will likely complete an inferolateral infarct with occluded OM.  She will be admitted to the ICU for management.  We will start IV heparin 6 hours post sheath removal.  If blood pressure tolerates, will use IV nitroglycerin, but  otherwise will use IV and oral narcotics for pain control.  Cycle troponin levels.  Restart most home medications with exception of Lasix.  I suspect that she should be in the hospital least 2 to 3 days to for completion of her infarct.  Monitor for signs of hypotension etc.  [name], MD      ECHO 11/25/2018 IMPRESSIONS  1. Left ventricular ejection fraction, by visual estimation, is 55 to 60%. The left ventricle has normal function. Normal left ventricular size. There is mildly increased left ventricular hypertrophy.  2. Elevated mean left atrial pressure.  3. Left ventricular diastolic Doppler parameters are consistent with impaired relaxation pattern of LV diastolic filling.  4. Global right ventricle has mildly reduced systolic function.The right ventricular size is normal. No increase in right ventricular wall thickness.  5. Left atrial size was mildly dilated.  6. Right atrial size was normal.  7. The mitral valve is abnormal. Mild to moderate mitral valve regurgitation.  8. The tricuspid valve is abnormal. Tricuspid valve regurgitation mild-moderate.  9. Normal pulmonary artery systolic pressure. RVSP 28 mmHg 10. The inferior vena cava is normal in size with <50% respiratory variability, suggesting right atrial pressure of 8 mmHg. 11. Abnormal septal motion consistent with left bundle branch block.  Patient Profile     Gabrielle Page  58 y.o. female with a history of non-STEMI having DES PCI to  LAD-diagonal back in September 2018.  Relook cath in February 2019 for unstable angina showed progression of disease upstream from the LAD stent.  This was felt to be not approachable from PCI standpoint and she was therefore referred for CABG. she had CABG x3 with LIMA-LAD, SVG-D1 and SVG-OM. Over the last several months she has been having progressively worsening dyspnea and some chest discomfort.  These symptoms have progressed despite diuresis for possible diastolic heart failure.   Assessment & Plan    1. NSTEMI: with SVG to OM occcluded attempt at opening 99% OM vessel  with inability to navigate sharp bend at prior graft anastamosis resulted in total occlusion and inferolateral MI.  HS trop 3355 > 17706 >11046.   Resolution of residual chest pain which he still experienced yesterday.  ECG remained stable.  Echo Doppler reviewed with patient.  EF 55 to 60%.  Left bundle branch block pattern.  Mild to moderate MR.  Continue low-dose beta-blocker and DAPT.  2. LBBB: stable  3. Hypokalemia: K 3.3 > 3.9 today  4. Low BP: Appetite improved.  Hydrated yesterday.  BP remains low.  Will not titrate medications.  She is asymptomatic.  5. Hyperlipidemia: Lipids today reveal total cholesterol 145, low HDL at 30, increased triglycerides at 225 with increased VLDL at 45 and LDL at 70 consistent with a mixed hyperlipidemic and atherogenic dyslipidemic pattern.  Will titrate statin therapy and will also recommend Vascepa 2 capsules twice a day per "reduce it trial "data.        6. Anemia:  H/H 11.5/33.8.> 9.4/27.8.  No active bleeding.  Will DC heparin  7.  Constipation: Last bowel movement 4 days ago.  Will give Dulcolax suppository  We will transfer out of 2H today to cardiac telemetry.  Plan for probable discharge tomorrow.  Keep in [place] today.  Signed, Troy Sine, MD, [facility] 11/26/2018, 11:23 AM

## 2018-11-26 NOTE — Progress Notes (Signed)
[place] for Heparin Indication: chest pain/ACS  Allergies  Allergen Reactions  . Amiodarone Diarrhea and Nausea And Vomiting    In combination with colchicine  . Colchicine Diarrhea and Nausea And Vomiting    Pt states she had rnx to medication; severe V/D  . Compazine [Prochlorperazine Edisylate] Other (See Comments)    SEIZURE  . Other Other (See Comments)    Steroids cause uncontrollable coughing    Patient Measurements: Height: 5\' 3"  (160 cm) Weight: 123 lb 7.3 oz (56 kg) IBW/kg (Calculated) : 52.4 Heparin Dosing Weight: 52.2  Vital Signs: Temp: 98.2 F (36.8 C) (10/14 0800) Temp Source: Oral (10/14 0800) BP: 81/55 (10/14 1000) Pulse Rate: 65 (10/14 1000)  Labs: Recent Labs    11/24/18 1015  11/24/18 1841  11/25/18 0227 11/25/18 0908 11/25/18 1205 11/25/18 2044 11/26/18 0452  HGB 9.9*  --   --   --  11.5*  --   --   --  9.4*  HCT 29.0*  --   --   --  33.8*  --   --   --  27.8*  PLT  --   --   --   --  240  --   --   --  250  HEPARINUNFRC  --   --   --    < > 0.90*  --  0.14* 0.10* 0.34  CREATININE  --   --   --   --  0.96  --   --   --  0.71  TROPONINIHS  --    < > 3,355*  --   --  17,706* 11,046*  --   --    < > = values in this interval not displayed.    Estimated Creatinine Clearance: 65 mL/min (by C-G formula based on SCr of 0.71 mg/dL).   Medical History: Past Medical History:  Diagnosis Date  . Asthma   . Chronic bronchitis ([place])    "usually get it q yr" (11/05/2016)  . GERD (gastroesophageal reflux disease)   . Headache    "weekly" (11/05/2016)  . Heart murmur    "dx'd when I was a child"  . Hepatitis 1991   "when I had my gallbladder out" (11/05/2016)  . History of blood transfusion 1985   S/P childbirth  . History of kidney stones   . NSTEMI (non-ST elevated myocardial infarction) ([place]) 11/05/2016   9/18 PCI/DESx1 to p/mLAD  . Pancreatitis 1991  . Pneumonia    "twice" (11/05/2016)     Medications:  Medications Prior to Admission  Medication Sig Dispense Refill Last Dose  . acetaminophen (TYLENOL) 325 MG tablet Take 325 mg by mouth every 6 (six) hours as needed for moderate pain or headache.    11/23/2018 at Unknown time  . aspirin EC 81 MG tablet Take 81 mg by mouth daily.   11/24/2018 at 0500  . atorvastatin (LIPITOR) 10 MG tablet Take 1 tablet (10 mg total) by mouth daily at 6 PM. 30 tablet 1 11/24/2018 at 0500  . busPIRone (BUSPAR) 5 MG tablet Take 5 mg by mouth 3 (three) times daily.   11/24/2018 at 0500  . furosemide (LASIX) 40 MG tablet Take 0.5 tablets (20 mg total) by mouth daily. 30 tablet 3 Past Week at Unknown time  . metoprolol tartrate (LOPRESSOR) 25 MG tablet Take 1 tablet (25 mg total) by mouth 2 (two) times daily. 60 tablet 1 11/24/2018 at 0500  . Multiple Vitamin (MULTIVITAMIN WITH  MINERALS) TABS tablet Take 1 tablet by mouth daily.   11/24/2018 at Unknown time  . nitroGLYCERIN (NITROSTAT) 0.4 MG SL tablet Place 0.4 mg under the tongue every 5 (five) minutes as needed for chest pain.   Past Month at Unknown time  . Omega-3 Fatty Acids (FISH OIL) 1000 MG CAPS Take 1,000 mg by mouth daily.    11/24/2018 at Unknown time  . potassium chloride SA (KLOR-CON) 20 MEQ tablet Take 20 mEq by mouth daily.   Past Week at Unknown time  . spironolactone (ALDACTONE) 25 MG tablet Take 0.5 tablets (12.5 mg total) by mouth daily. 30 tablet 3 Past Week at Unknown time  . traZODone (DESYREL) 50 MG tablet Take 50 mg by mouth at bedtime.    11/23/2018 at Unknown time  . albuterol (VENTOLIN HFA) 108 (90 Base) MCG/ACT inhaler Inhale 2 puffs into the lungs every 6 (six) hours as needed for wheezing or shortness of breath.   More than a month at Unknown time    Assessment: 49 YOF presented for cardiac cath, which was unsuccessful due to inability to cross lesion. Will medically manage with heparin while completing infarct. - Start heparin 6 hours after sheath removal. - Not on  anticoagulation prior to admission.  Heparin to stop after 48h per Dr. Claiborne Billings - will discontinue confirmatory level.  Goal of Therapy:  Heparin level 0.3-0.7 units/ml Monitor platelets by anticoagulation protocol: Yes   Plan:  -Cont heparin at 750 units/hr until [place], PharmD, BCPS Clinical Pharmacist [phone] Please check AMION for all [place] numbers 11/26/2018

## 2018-11-26 NOTE — Progress Notes (Signed)
CARDIAC REHAB PHASE I   PRE:  Rate/Rhythm: 58 SB  BP:  Supine: 82/52  Sitting: 94/67  Standing: 84/61   SaO2: 100%RA  MODE:  Ambulation: 0 ft [phone] Reviewed walking instructions for home exercise to increase mobility slowly. Pt's BP is low this morning as documented with orthostatics. Pt denied dizziness but stated felt very tired so did not walk. Pt stated she has been dealing with low BPs for weeks and has felt tired and that is one reason she had cath. She also stated she had been in chair this morning and tolerated that activity. Was resting in bed when I arrived. Staff can increase activity as BP  Improves. Will continue to follow .      Graylon Good, RN BSN  11/26/2018 9:40 AM

## 2018-11-27 ENCOUNTER — Telehealth: Payer: Self-pay | Admitting: Cardiology

## 2018-11-27 ENCOUNTER — Encounter (HOSPITAL_COMMUNITY): Payer: Self-pay | Admitting: Cardiology

## 2018-11-27 DIAGNOSIS — E785 Hyperlipidemia, unspecified: Secondary | ICD-10-CM

## 2018-11-27 HISTORY — DX: Hyperlipidemia, unspecified: E78.5

## 2018-11-27 LAB — CBC
HCT: 26.1 % — ABNORMAL LOW (ref 36.0–46.0)
Hemoglobin: 9.1 g/dL — ABNORMAL LOW (ref 12.0–15.0)
MCH: 32.2 pg (ref 26.0–34.0)
MCHC: 34.9 g/dL (ref 30.0–36.0)
MCV: 92.2 fL (ref 80.0–100.0)
Platelets: 234 10*3/uL (ref 150–400)
RBC: 2.83 MIL/uL — ABNORMAL LOW (ref 3.87–5.11)
RDW: 13.9 % (ref 11.5–15.5)
WBC: 5.1 10*3/uL (ref 4.0–10.5)
nRBC: 0 % (ref 0.0–0.2)

## 2018-11-27 LAB — BASIC METABOLIC PANEL
Anion gap: 7 (ref 5–15)
BUN: 7 mg/dL (ref 6–20)
CO2: 21 mmol/L — ABNORMAL LOW (ref 22–32)
Calcium: 9.1 mg/dL (ref 8.9–10.3)
Chloride: 110 mmol/L (ref 98–111)
Creatinine, Ser: 0.6 mg/dL (ref 0.44–1.00)
GFR calc Af Amer: >60 mL/min (ref >60)
GFR calc non Af Amer: >60 mL/min (ref >60)
Glucose, Bld: 106 mg/dL — ABNORMAL HIGH (ref 70–99)
Potassium: 3.9 mmol/L (ref 3.5–5.1)
Sodium: 138 mmol/L (ref 135–145)

## 2018-11-27 MED ORDER — ATORVASTATIN CALCIUM 80 MG PO TABS: 80.0000 mg | ORAL_TABLET | Freq: Every day | ORAL | 6 refills | Status: AC

## 2018-11-27 MED ORDER — ICOSAPENT ETHYL 1 G PO CAPS: 2.0000 | ORAL_CAPSULE | Freq: Two times a day (BID) | ORAL | 6 refills | Status: DC

## 2018-11-27 MED ORDER — CLOPIDOGREL BISULFATE 75 MG PO TABS: 75.0000 mg | ORAL_TABLET | Freq: Every day | ORAL | 11 refills | Status: DC

## 2018-11-27 NOTE — Progress Notes (Addendum)
Progress Note  Patient Name: Gabrielle Page Date of Encounter: 11/27/2018  Primary Cardiologist: Peter Martinique, MD   Subjective   No angina, feeling better, would like to go home  Inpatient Medications    Scheduled Meds: . aspirin EC  81 mg Oral Daily  . atorvastatin  80 mg Oral q1800  . busPIRone  5 mg Oral TID  . Chlorhexidine Gluconate Cloth  6 each Topical Daily  . Chlorhexidine Gluconate Cloth  6 each Topical Q0600  . clopidogrel  75 mg Oral Q breakfast  . influenza vac split quadrivalent PF  0.5 mL Intramuscular Tomorrow-1000  . metoprolol tartrate  25 mg Oral BID  . mupirocin ointment  1 application Nasal BID  . sodium chloride flush  3 mL Intravenous Q12H  . traZODone  50 mg Oral QHS   Continuous Infusions: . sodium chloride 5 mL/hr at 11/26/18 1200   PRN Meds: sodium chloride, acetaminophen, albuterol, bisacodyl, HYDROmorphone (DILAUDID) injection, ondansetron (ZOFRAN) IV, oxyCODONE, sodium chloride flush   Vital Signs    Vitals:   11/27/18 0300 11/27/18 0305 11/27/18 0334 11/27/18 0359  BP: (!) 76/49 (!) 82/55 (!) 93/52 106/61  Pulse:    75  Resp:      Temp:    97.6 F (36.4 C)  TempSrc:    Oral  SpO2: 100% 100% 100% 100%  Weight:    56 kg  Height:        Intake/Output Summary (Last 24 hours) at 11/27/2018 0859 Last data filed at 11/27/2018 0847 Gross per 24 hour  Intake 530.01 ml  Output -  Net 530.01 ml   Last 3 Weights 11/27/2018 11/26/2018 11/25/2018  Weight (lbs) 123 lb 6.4 oz 123 lb 7.3 oz 121 lb 7.6 oz  Weight (kg) 55.974 kg 56 kg 55.1 kg      Telemetry    SR  - Personally Reviewed  ECG      11/26/2018 ECG (independently read by me): Normal sinus rhythm at 66 bpm.  Left bundle branch block.  QTc interval 484 msec  11/25/2018 ECG (independently read by me): NSR at 62; LBBB  Physical Exam   GEN: No acute distress.   Neck: No JVD Cardiac: RRR, soft systolic murmur, no rubs, or gallops.  Respiratory: Clear to  auscultation bilaterally. GI: Soft, nontender, non-distended  MS: No edema; No deformity. Neuro:  Nonfocal  Psych: Normal affect   Labs    High Sensitivity Troponin:   Recent Labs  Lab 11/24/18 1414 11/24/18 1635 11/24/18 1841 11/25/18 0908 11/25/18 1205  TROPONINIHS 39* 660* 3,355* 17,706* 11,046*      Chemistry Recent Labs  Lab 11/25/18 0227 11/26/18 0452 11/27/18 0225  NA 137 137 138  K 3.3* 3.9 3.9  CL 109 111 110  CO2 19* 19* 21*  GLUCOSE 91 95 106*  BUN 22* 12 7  CREATININE 0.96 0.71 0.60  CALCIUM 9.0 8.9 9.1  GFRNONAA >60 >60 >60  GFRAA >60 >60 >60  ANIONGAP 9 7 7      Hematology Recent Labs  Lab 11/25/18 0227 11/26/18 0452 11/27/18 0225  WBC 5.0 5.4 5.1  RBC 3.66* 2.97* 2.83*  HGB 11.5* 9.4* 9.1*  HCT 33.8* 27.8* 26.1*  MCV 92.3 93.6 92.2  MCH 31.4 31.6 32.2  MCHC 34.0 33.8 34.9  RDW 13.5 14.1 13.9  PLT 240 250 234    BNPNo results for input(s): BNP, PROBNP in the last 168 hours.   DDimer No results for input(s): DDIMER in the last 168 hours.  Radiology    No results found.  Cardiac Studies    Dist LM lesion is 40% stenosed.  Ost LAD lesion is 80% stenosed. -As noted by Dr. Martinique and regional cath  Prox LAD to Mid LAD stent across the diagonal is widely patent  Mid LAD-1 lesion is 60% stenosed. Mid LAD-2 lesion is 80% stenosed.  Ost 1st Diag lesion is 60% stenosed.  LIMA graft was visualized by angiography and is small -to a relatively small caliber downstream LAD.  SVG-OM1 graft was visualized by angiography and is normal in caliber. Origin to Prox Graft lesion is 100% stenosed.  SVG-1ST DIAG graft was visualized by angiography and is normal in caliber. Origin to Prox Graft lesion is 100% stenosed.  1st Mrg lesion is 99% stenosed at the graft insertion site  Balloon angioplasty was attempted, lesion could not be crossed. Unfortunately lesion progressed.  Post intervention, there is a 100% residual stenosis. After multiple  attempts to cross, procedure aborted.  ------------------------------------------  LV end diastolic pressure is normal. Right heart pressures normal.  Mildly reduced cardiac output/index  SUMMARY  PeriprocedureType 4 MI -with now occluded occlusion of native OM1 at anastomosis site. Attempted PTCA of 99% lesion, unable to advance wire beyond the lesion likely due to suture stricture. With wire removal there was now acute occlusion of the downstream OM.  Occluded SVG-OM and SVG-diagonal  Patent but very small LIMA-LAD with competitive flow to the distal LAD.  Likely significant ostial LAD lesion but with TIMI-3 flow in the diagonal branch. The LAD itself tapers to a relatively small caliber distally with competitive flow. The stents itself is widely patent  Totally normal right heart cath pressures with no evidence of CHF. Right atrial, RVEDP, LVEDP and PCWP pressures are all normal.  Mildly reduced cardiac output of 2.44 with an index of 2.91.    RECOMMENDATIONS:   Unfortunately it seems like the patient will likely complete an inferolateral infarct with occluded OM. She will be admitted to the ICU for management.  We will start IV heparin 6 hours post sheath removal. If blood pressure tolerates, will use IV nitroglycerin, but otherwise will use IV and oral narcotics for pain control.  Cycle troponin levels.  Restart most home medications with exception of Lasix.  I suspect that she should be in the hospital least 2 to 3 days to for completion of her infarct. Monitor for signs of hypotension etc.  Gabrielle Hew, MD      ECHO 11/25/2018 IMPRESSIONS 1. Left ventricular ejection fraction, by visual estimation, is 55 to 60%. The left ventricle has normal function. Normal left ventricular size. There is mildly increased left ventricular hypertrophy. 2. Elevated mean left atrial pressure. 3. Left ventricular diastolic Doppler parameters are consistent with  impaired relaxation pattern of LV diastolic filling. 4. Global right ventricle has mildly reduced systolic function.The right ventricular size is normal. No increase in right ventricular wall thickness. 5. Left atrial size was mildly dilated. 6. Right atrial size was normal. 7. The mitral valve is abnormal. Mild to moderate mitral valve regurgitation. 8. The tricuspid valve is abnormal. Tricuspid valve regurgitation mild-moderate. 9. Normal pulmonary artery systolic pressure. RVSP 28 mmHg 10. The inferior vena cava is normal in size with <50% respiratory variability, suggesting right atrial pressure of 8 mmHg. 11. Abnormal septal motion consistent with left bundle branch block.   Patient Profile     58 y.o. female with a history of non-STEMI having DES PCI to LAD-diagonal back in September 2018.  Relook cath in February 2019 for unstable angina showed progression of disease upstream from the LAD stent. This was felt to be not approachable from PCI standpoint and she was therefore referred for CABG. she had CABG x3 with LIMA-LAD, SVG-D1 and SVG-OM. Over the last several months she has been having progressively worsening dyspnea and some chest discomfort. These symptoms have progressed despite diuresis for possible diastolic heart failure.  Assessment & Plan    1. NSTEMI: with SVG to OM occcluded attempt at opening 99% OM vessel  with inability to navigate sharp bend at prior graft anastamosis resulted in total occlusion and inferolateral MI.  HS trop 3355 > 17706 >11046.   Resolution of residual chest pain which he still experienced yesterday.  ECG remained stable.  Echo Doppler reviewed with patient.  EF 55 to 60%.  Left bundle branch block pattern.  Mild to moderate MR.  Continue low-dose beta-blocker and DAPT. (ASA & Plavix)  2. LBBB: stable  3. Hypokalemia: K 3.3 > 3.9 >3.9  today  4. Low BP: Appetite improved.  Hydrated yesterday.  BP remains low.  Will not titrate medications.   She is asymptomatic. BP 76/49 to 106/61   5. Hyperlipidemia: Lipids today reveal total cholesterol 145, low HDL at 30, increased triglycerides at 225 with increased VLDL at 45 and LDL at 70 consistent with a mixed hyperlipidemic and atherogenic dyslipidemic pattern.  Will titrate statin therapy and will also recommend Vascepa 2 capsules twice a day per "reduce it trial "data.        6. Anemia:  H/H 11.5/33.8.> 9.4/27.8.> 9.1/26.1  No active bleeding. Has been off heparin  7.  Constipation: Last bowel movement 4 days ago.  Will give Dulcolax suppository   For questions or updates, please contact [place] Please consult [email or web address] for contact info under      Signed, Cecilie Kicks, NP  11/27/2018, 8:59 AM     Patient seen and examined. Agree with assessment and plan.  Ms. Dainty feels significantly improved today.  She had a bowel movement following Dulcolax suppository yesterday.  Her blood pressure is risen now into the low [id number] systolically.  She has been able to ambulate without dizziness or recurrent chest pain.  We will add Vascepa to statin therapy at discharge for her mixed hyperlipidemia.  She remains mildly anemic.  Will need to follow-up as outpatient.  Okay to discharge today with follow-up with Dr. Shirlee More.   Troy Sine, MD, [facility] 11/27/2018 11:03 AM

## 2018-11-27 NOTE — Telephone Encounter (Signed)
Left message for patient to return call.  Will continue efforts.  

## 2018-11-27 NOTE — Progress Notes (Signed)
CARDIAC REHAB PHASE I   PRE:  Rate/Rhythm: 71 SR  BP:  Supine: 105/66  Sitting:   Standing:    SaO2: 100%RA  MODE:  Ambulation: 750 ft   POST:  Rate/Rhythm: 95 SR  BP:  Supine: 116/70  Sitting:   Standing:    SaO2: 99%RA [id number] Pt walked 750 ft on RA with steady gait and tolerated well. No CP or dizziness. Hopes to go home.    Graylon Good, RN BSN  11/27/2018 9:40 AM

## 2018-11-27 NOTE — Discharge Instructions (Signed)
Heart Attack The heart is a muscle that needs oxygen to survive. A heart attack is a condition that occurs when your heart does not get enough oxygen. When this happens, the heart muscle begins to die. This can cause permanent damage if not treated right away. A heart attack is a medical emergency. This condition may be called a myocardial infarction, or MI. It is also known as acute coronary syndrome (ACS). ACS is a term used to describe a group of conditions that affect blood flow to the heart. What are the causes? This condition may be caused by:  Atherosclerosis. This occurs when a fatty substance called plaque builds up in the arteries and blocks or reduces blood supply to the heart.  A blood clot. A blood clot can develop suddenly when plaque breaks up within an artery and blocks blood flow to the heart.  Low blood pressure.  An abnormal heartbeat (arrhythmia).  Conditions that cause a decrease of oxygen to the heart, such as anemiaorrespiratory failure.  A spasm, or severe tightening, of a blood vessel that cuts off blood flow to the heart.  Tearing of a coronary artery (spontaneous coronary artery dissection).  High blood pressure. What increases the risk? The following factors may make you more likely to develop this condition:  Aging. The older you are, the higher your risk.  Having a personal or family history of chest pain, heart attack, stroke, or narrowing of the arteries in the legs, arms, head, or stomach (peripheral artery disease).  Being female.  Smoking.  Not getting regular exercise.  Being overweight or obese.  Having high blood pressure.  Having high cholesterol (hypercholesterolemia).  Having diabetes.  Drinking too much alcohol.  Using illegal drugs, such as cocaine or methamphetamine. What are the signs or symptoms? Symptoms of this condition may vary, depending on factors like gender and age. Symptoms may include:  Chest pain. It may feel  like: ? Crushing or squeezing. ? Tightness, pressure, fullness, or heaviness.  Pain in the arm, neck, jaw, back, or upper body.  Shortness of breath.  Heartburn or upset stomach.  Nausea.  Sudden cold sweats.  Feeling tired.  Sudden light-headedness. How is this diagnosed? This condition may be diagnosed through tests, such as:  Electrocardiogram (ECG) to measure the electrical activity of your heart.  Blood tests to check for cardiac markers. These chemicals are released by a damaged heart muscle.  A test to evaluate blood flow and heart function (coronary angiogram).  CT scan to see the heart more clearly.  A test to evaluate the pumping action of the heart (echocardiogram). How is this treated? A heart attack must be treated as soon as possible. Treatment may include:  Medicines to: ? Break up or dissolve blood clots (fibrinolytic therapy). ? Thin blood and help prevent blood clots. ? Treat blood pressure. ? Improve blood flow to the heart. ? Reduce pain. ? Reduce cholesterol.  Angioplasty and stent placement. These are procedures to widen a blocked artery and keep it open.  Coronary artery bypass graft, CABG, or open heart surgery. This enables blood to flow to the heart by going around the blocked part of the artery.  Oxygen therapy if needed.  Cardiac rehabilitation. This improves your health and well-being through exercise, education, and counseling. Follow these instructions at home: Medicines  Take over-the-counter and prescription medicines only as told by your health care provider.  Do not take the following medicines unless your health care provider says it is  okay to take them: ? NSAIDs, such as ibuprofen. ? Supplements that contain vitamin A, vitamin E, or both. ? Hormone replacement therapy that contains estrogen with or without progestin. Lifestyle   Do not use any products that contain nicotine or tobacco, such as cigarettes, e-cigarettes,  and chewing tobacco. If you need help quitting, ask your health care provider.  Avoid secondhand smoke.  Exercise regularly. Ask your health care provider about participating in a cardiac rehabilitation program that helps you start exercising safely after a heart attack.  Eat a heart-healthy diet. Your health care provider will tell you what foods to eat.  Maintain a healthy weight.  Learn ways to manage stress.  Do not use illegal drugs. Alcohol use  Do not drink alcohol if: ? Your health care provider tells you not to drink. ? You are pregnant, may be pregnant, or are planning to become pregnant.  If you drink alcohol: ? Limit how much you use to:  0-1 drink a day for women.  0-2 drinks a day for men. ? Be aware of how much alcohol is in your drink. In the [place] one drink equals one 12 oz bottle of beer (355 mL), one 5 oz glass of wine (148 mL), or one 1 oz glass of hard liquor (44 mL). General instructions  Work with your health care provider to manage any other conditions you have, such as high blood pressure or diabetes. These conditions affect your heart.  Get screened for depression, and seek treatment if needed.  Keep your vaccinations up to date. Get the flu vaccine every year.  Keep all follow-up visits as told by your health care provider. This is important. Contact a health care provider if:  You feel overwhelmed or sad.  You have trouble doing your daily activities. Get help right away if:  You have sudden, unexplained discomfort in your chest, arms, back, neck, jaw, or upper body.  You have shortness of breath.  You suddenly start to sweat or your skin gets clammy.  You feel nauseous or you vomit.  You have unexplained tiredness or weakness.  You suddenly feel light-headed or dizzy.  You notice your heart starts to beat fast or feels like it is skipping beats.  You have blood pressure that is higher than 180/120. These symptoms may represent a  serious problem that is an emergency. Do not wait to see if the symptoms will go away. Get medical help right away. Call your local emergency services (911 in the [place] Do not drive yourself to the hospital. Summary  A heart attack, also called myocardial infarction, is a condition that occurs when your heart does not get enough oxygen. This is caused by anything that blocks or reduces blood flow to the heart.  Treatment is a combination of medicines and surgeries, if needed, to open the blocked arteries and restore blood flow to the heart.  A heart attack is an emergency. Get help right away if you have sudden discomfort in your chest, arms, back, neck, jaw, or upper body. Seek help if you feel nauseous, you vomit, or you feel light-headed or dizzy. This information is not intended to replace advice given to you by your health care provider. Make sure you discuss any questions you have with your health care provider. Document Released: 01/29/2005 Document Revised: 05/08/2018 Document Reviewed: 05/12/2018 Elsevier Patient Education  2020 [place] not stop ASA or Plavix, stopping could cause a heart attack.  Heart Healthy Diet  Call  [facility] at [phone] if any bleeding, swelling or drainage at cath site.  May shower, no tub baths for 48 hours for groin sticks. No lifting over 5 pounds for 5 days.  No Driving for 5 days  Take 1 NTG, under your tongue, while sitting.  If no relief of pain may repeat NTG, one tab every 5 minutes up to 3 tablets total over 15 minutes.  If no relief CALL 911.  If you have dizziness/lightheadness  while taking NTG, stop taking and call 911.

## 2018-11-27 NOTE — Telephone Encounter (Signed)
I see the patient is coming in next week on December 03, 2018 to see Urban Gibson.  Please ask her to bring all of her medication so we can be able to review.  This will help [place] to be able to make better informed recommendation.

## 2018-11-27 NOTE — Telephone Encounter (Signed)
Called patient she was put on Vascepa 1g two capsules twice daily when she was discharged from the hospital. She is unable to afford this and wants to know if there is a alternative that is cheaper. Will consult with DOD.

## 2018-11-27 NOTE — Discharge Summary (Signed)
Discharge Summary    Patient ID: Michaella Kreiter MRN: [id number]; DOB: [birth year]  Admit date: 11/24/2018 Discharge date: 11/27/2018  Primary Care Provider: Jaclynn Major, NP  Primary Cardiologist: Shirlee More, MD  Primary Electrophysiologist:  None   Discharge Diagnoses    Principal Problem:   Acute ST elevation myocardial infarction (STEMI) of inferolateral wall [facility]) Active Problems:   LBBB (left bundle branch block)   S/P CABG x 3   Coronary artery disease involving native coronary artery with angina pectoris ([place])   Chronic diastolic heart failure (HCC)   HLD (hyperlipidemia)   Allergies Allergies  Allergen Reactions  . Amiodarone Diarrhea and Nausea And Vomiting    In combination with colchicine  . Colchicine Diarrhea and Nausea And Vomiting    Pt states she had rnx to medication; severe V/D  . Compazine [Prochlorperazine Edisylate] Other (See Comments)    SEIZURE  . Other Other (See Comments)    Steroids cause uncontrollable coughing    Diagnostic Studies/Procedures   cardiac cath  11/24/18  Dist LM lesion is 40% stenosed.  Ost LAD lesion is 80% stenosed. -As noted by Dr. Martinique and regional cath  Prox LAD to Mid LAD stent across the diagonal is widely patent  Mid LAD-1 lesion is 60% stenosed. Mid LAD-2 lesion is 80% stenosed.  Ost 1st Diag lesion is 60% stenosed.  LIMA graft was visualized by angiography and is small -to a relatively small caliber downstream LAD.  SVG-OM1 graft was visualized by angiography and is normal in caliber. Origin to Prox Graft lesion is 100% stenosed.  SVG-1ST DIAG graft was visualized by angiography and is normal in caliber. Origin to Prox Graft lesion is 100% stenosed.  1st Mrg lesion is 99% stenosed at the graft insertion site  Balloon angioplasty was attempted, lesion could not be crossed. Unfortunately lesion progressed.  Post intervention, there is a 100% residual stenosis. After multiple attempts to  cross, procedure aborted.  ------------------------------------------  LV end diastolic pressure is normal. Right heart pressures normal.  Mildly reduced cardiac output/index  SUMMARY  PeriprocedureType 4 MI -with now occluded occlusion of native OM1 at anastomosis site. Attempted PTCA of 99% lesion, unable to advance wire beyond the lesion likely due to suture stricture. With wire removal there was now acute occlusion of the downstream OM.  Occluded SVG-OM and SVG-diagonal  Patent but very small LIMA-LAD with competitive flow to the distal LAD.  Likely significant ostial LAD lesion but with TIMI-3 flow in the diagonal branch. The LAD itself tapers to a relatively small caliber distally with competitive flow. The stents itself is widely patent  Totally normal right heart cath pressures with no evidence of CHF. Right atrial, RVEDP, LVEDP and PCWP pressures are all normal.  Mildly reduced cardiac output of 2.44 with an index of 2.91.   RECOMMENDATIONS:   Unfortunately it seems like the patient will likely complete an inferolateral infarct with occluded OM. She will be admitted to the ICU for management.  We will start IV heparin 6 hours post sheath removal. If blood pressure tolerates, will use IV nitroglycerin, but otherwise will use IV and oral narcotics for pain control.  Cycle troponin levels.  Restart most home medications with exception of Lasix.  I suspect that she should be in the hospital least 2 to 3 days to for completion of her infarct. Monitor for signs of hypotension etc.  [name], MD      ECHO 11/25/2018 IMPRESSIONS 1. Left ventricular ejection fraction, by visual estimation, is  55 to 60%. The left ventricle has normal function. Normal left ventricular size. There is mildly increased left ventricular hypertrophy. 2. Elevated mean left atrial pressure. 3. Left ventricular diastolic Doppler parameters are consistent with impaired  relaxation pattern of LV diastolic filling. 4. Global right ventricle has mildly reduced systolic function.The right ventricular size is normal. No increase in right ventricular wall thickness. 5. Left atrial size was mildly dilated. 6. Right atrial size was normal. 7. The mitral valve is abnormal. Mild to moderate mitral valve regurgitation. 8. The tricuspid valve is abnormal. Tricuspid valve regurgitation mild-moderate. 9. Normal pulmonary artery systolic pressure. RVSP 28 mmHg 10. The inferior vena cava is normal in size with <50% respiratory variability, suggesting right atrial pressure of 8 mmHg. 11. Abnormal septal motion consistent with left bundle branch block.   _____________   History of Present Illness     Gabrielle Page is a 58 y.o. female with a hx of CAD with NonSTEMI with PCI and DES of LAD September 2018 and CABG 02/12/2019with LIMA to LAD, SVG to Diag and SVG to [facility] post operative PAF and pericardial effusion   last seen 10/13/2018.  Her proBNP level was moderately elevated at 302 echocardiogram showed normal ejection fraction mild left ventricular hypertrophy and elevated diastolic pressure with pseudo-normal pattern.  Is mild to moderate mitral regurgitation.    Pt had continued fatigue though edema resolved. + orthopnea.  Atypical chest pain and was arranged for cardiac cath Rt and Lt to eval.her ongoing symptoms.  She presented for cath 11/24/18.     With her cardiac cath PeriprocedureType 4 MI -with now occluded occlusion of native OM1 at anastomosis site. Attempted PTCA of 99% lesion, unable to advance wire beyond the lesion likely due to suture stricture. With wire removal there was now acute occlusion of the downstream OM.   Occluded SVG-OM and SVG-diagonal.  Patent but very small LIMA-LAD with competitive flow to the distal LAD.  Likely significant ostial LAD lesion but with TIMI-3 flow in the diagonal branch. The LAD itself tapers to a relatively  small caliber distally with competitive flow. The stents itself is widely patent.  Totally normal right heart cath pressures with no evidence of CHF. Right atrial, RVEDP, LVEDP and PCWP pressures are all normal.  Mildly reduced cardiac output of 2.44 with an index of 2.91.   Unfortunately it seems like the patient will likely complete an inferolateral infarct with occluded OM.  She will be admitted to the ICU for management.  she should be in the hospital least 2 to 3 days to for completion of her infarct.  Monitor for signs of hypotension etc.   Hospital Course     Consultants: none   Her troponin peaked at 17,706.   She has occ low BP here but is asymptomatic.  No further chest pain and no SOB.  She feels well today.  She has ambulated without issues and has been seen and examined by Dr. Claiborne Billings and found stable for discharge.  Labs stable. Pt will follow up with Dr. Bettina Gavia. 1-2 weeks  TOC post MI     Did the patient have an acute coronary syndrome (MI, NSTEMI, STEMI, etc) this admission?:  Yes                               AHA/ACC Clinical Performance & Quality Measures: 1. Aspirin prescribed? - Yes 2. ADP Receptor Inhibitor (Plavix/Clopidogrel, Brilinta/Ticagrelor or Effient/Prasugrel) prescribed (  includes medically managed patients)? - Yes 3. Beta Blocker prescribed? - Yes 4. High Intensity Statin (Lipitor 40-80mg  or Crestor 20-40mg ) prescribed? - Yes 5. EF assessed during THIS hospitalization? - Yes 6. For EF <40%, was ACEI/ARB prescribed? - na 7. For EF <40%, Aldosterone Antagonist (Spironolactone or Eplerenone) prescribed? - Not Applicable (EF >/= [id number]) 8. Cardiac Rehab Phase II ordered (Included Medically managed Patients)? - Yes   _____________  Discharge Vitals Blood pressure 106/61, pulse 75, temperature 97.6 F (36.4 C), temperature source Oral, resp. rate 19, height 5\' 3"  (1.6 m), weight 56 kg, SpO2 100 %.  Filed Weights   11/25/18 0500 11/26/18 0400 11/27/18 0359  Weight:  55.1 kg 56 kg 56 kg    Labs & Radiologic Studies    CBC Recent Labs    11/26/18 0452 11/27/18 0225  WBC 5.4 5.1  HGB 9.4* 9.1*  HCT 27.8* 26.1*  MCV 93.6 92.2  PLT 250 [id number]   Basic Metabolic Panel Recent Labs    11/26/18 0452 11/27/18 0225  NA 137 138  K 3.9 3.9  CL 111 110  CO2 19* 21*  GLUCOSE 95 106*  BUN 12 7  CREATININE 0.71 0.60  CALCIUM 8.9 9.1   Liver Function Tests No results for input(s): AST, ALT, ALKPHOS, BILITOT, PROT, ALBUMIN in the last 72 hours. No results for input(s): LIPASE, AMYLASE in the last 72 hours. High Sensitivity Troponin:   Recent Labs  Lab 11/24/18 1414 11/24/18 1635 11/24/18 1841 11/25/18 0908 11/25/18 1205  TROPONINIHS 39* 660* 3,355* 17,706* 11,046*    BNP Invalid input(s): POCBNP D-Dimer No results for input(s): DDIMER in the last 72 hours. Hemoglobin A1C No results for input(s): HGBA1C in the last 72 hours. Fasting Lipid Panel Recent Labs    11/26/18 0452  CHOL 145  HDL 30*  LDLCALC 70  TRIG 225*  CHOLHDL 4.8   Thyroid Function Tests No results for input(s): TSH, T4TOTAL, T3FREE, THYROIDAB in the last 72 hours.  Invalid input(s): FREET3 _____________  Dg Chest 2 View  Result Date: 11/13/2018 CLINICAL DATA:  Chest pain and shortness of breath, chronic EXAM: CHEST - 2 VIEW COMPARISON:  May 17, 2018. FINDINGS: Patient is status post coronary artery bypass grafting. Heart is upper normal in size with pulmonary vascularity normal. No edema or consolidation. No adenopathy. No bone lesions. No pneumothorax. IMPRESSION: Status post coronary artery bypass grafting. Heart upper normal in size. No edema or consolidation. No adenopathy. Electronically Signed   By: Lowella Grip III M.D.   On: 11/13/2018 15:28   Disposition   Pt is being discharged home today in good condition.  Follow-up Plans & Appointments   Do not stop ASA or Plavix, stopping could cause a heart attack.  Heart Healthy Diet  Call [facility] at [phone] if any bleeding, swelling or drainage at cath site.  May shower, no tub baths for 48 hours for groin sticks. No lifting over 5 pounds for 5 days.  No Driving for 5 days  Take 1 NTG, under your tongue, while sitting.  If no relief of pain may repeat NTG, one tab every 5 minutes up to 3 tablets total over 15 minutes.  If no relief CALL 911.  If you have dizziness/lightheadness  while taking NTG, stop taking and call 911.        Discharge Instructions    Amb Referral to Cardiac Rehabilitation   Complete by: As directed    Referred to [place] [facility] 2  Diagnosis: NSTEMI   After initial evaluation and assessments completed: Virtual Based Care may be provided alone or in conjunction with Phase 2 Cardiac Rehab based on patient barriers.: Yes      Discharge Medications   Allergies as of 11/27/2018      Reactions   Amiodarone Diarrhea, Nausea And Vomiting   In combination with colchicine   Colchicine Diarrhea, Nausea And Vomiting   Pt states she had rnx to medication; severe V/D   Compazine [prochlorperazine Edisylate] Other (See Comments)   SEIZURE   Other Other (See Comments)   Steroids cause uncontrollable coughing      Medication List    STOP taking these medications   Fish Oil 1000 MG Caps   furosemide 40 MG tablet Commonly known as: LASIX   potassium chloride SA 20 MEQ tablet Commonly known as: KLOR-CON   spironolactone 25 MG tablet Commonly known as: ALDACTONE     TAKE these medications   albuterol 108 (90 Base) MCG/ACT inhaler Commonly known as: VENTOLIN HFA Inhale 2 puffs into the lungs every 6 (six) hours as needed for wheezing or shortness of breath.   aspirin EC 81 MG tablet Take 81 mg by mouth daily.   atorvastatin 80 MG tablet Commonly known as: LIPITOR Take 1 tablet (80 mg total) by mouth daily at 6 PM. What changed:   medication strength  how much to take   busPIRone 5 MG tablet Commonly known as: BUSPAR Take 5  mg by mouth 3 (three) times daily.   clopidogrel 75 MG tablet Commonly known as: PLAVIX Take 1 tablet (75 mg total) by mouth daily with breakfast. Start taking on: November 28, 2018   Icosapent Ethyl 1 g Caps Take 2 capsules (2 g total) by mouth 2 (two) times daily.   metoprolol tartrate 25 MG tablet Commonly known as: LOPRESSOR Take 1 tablet (25 mg total) by mouth 2 (two) times daily.   multivitamin with minerals Tabs tablet Take 1 tablet by mouth daily.   nitroGLYCERIN 0.4 MG SL tablet Commonly known as: NITROSTAT Place 0.4 mg under the tongue every 5 (five) minutes as needed for chest pain.   traZODone 50 MG tablet Commonly known as: DESYREL Take 50 mg by mouth at bedtime.   Tylenol 325 MG tablet Generic drug: acetaminophen Take 325 mg by mouth every 6 (six) hours as needed for moderate pain or headache.          Outstanding Labs/Studies   Hepatic and lipids in 6 weeks, CBC for mild anemia  Duration of Discharge Encounter   Greater than 30 minutes including physician time.  Signed, Cecilie Kicks, NP 11/27/2018, 12:08 PM

## 2018-11-27 NOTE — Telephone Encounter (Signed)
Patient called, was just discharged from [facility] from having a heart attack  The medicine they put her on, she can not afford and wants to know what she can take in lieu of what was prescribed  Please call patient to discuss

## 2018-11-27 NOTE — Care Management (Signed)
11-27-18 1130 Patient is without insurance. Her PCP is at [facility]. Patient gets medications from [facility] and if they don't have the medications she uses [facility] on the $4.00 medication list. Patient has applied for disability-claim processing for one year now. No further needs from CM at this time. Bethena Roys, RN,BSN Case Manager [phone]

## 2018-11-28 NOTE — Plan of Care (Signed)
Pt discharged to home, printed and verbal education completed, left facility via private vehicle accompanied by brother.

## 2018-11-28 NOTE — Telephone Encounter (Signed)
Left message for patient to return call.

## 2018-11-28 NOTE — Telephone Encounter (Signed)
Patient called back. She was informed to bring all medications to upcoming appointment and from there the best recommendation for her medications can be made. Patient verbally understood. No further questions.

## 2018-12-03 ENCOUNTER — Encounter: Payer: Self-pay | Admitting: Family

## 2018-12-03 ENCOUNTER — Other Ambulatory Visit: Payer: Self-pay

## 2018-12-03 ENCOUNTER — Ambulatory Visit (INDEPENDENT_AMBULATORY_CARE_PROVIDER_SITE_OTHER): Payer: Self-pay | Admitting: Family

## 2018-12-03 VITALS — BP 118/68 | HR 64 | Ht 63.0 in | Wt 116.0 lb

## 2018-12-03 DIAGNOSIS — Z951 Presence of aortocoronary bypass graft: Secondary | ICD-10-CM

## 2018-12-03 DIAGNOSIS — I447 Left bundle-branch block, unspecified: Secondary | ICD-10-CM

## 2018-12-03 DIAGNOSIS — I25708 Atherosclerosis of coronary artery bypass graft(s), unspecified, with other forms of angina pectoris: Secondary | ICD-10-CM

## 2018-12-03 DIAGNOSIS — E785 Hyperlipidemia, unspecified: Secondary | ICD-10-CM

## 2018-12-03 MED ORDER — RANOLAZINE ER 500 MG PO TB12: 500.0000 mg | ORAL_TABLET | Freq: Two times a day (BID) | ORAL | 2 refills | Status: DC

## 2018-12-03 MED ORDER — RANOLAZINE ER 500 MG PO TB12: 500.0000 mg | ORAL_TABLET | Freq: Two times a day (BID) | ORAL | 3 refills | Status: DC

## 2018-12-03 MED FILL — RANOLAZINE ER 500 MG TABLET: 500 | 30 days supply | Qty: 60 | Fill #0

## 2018-12-03 NOTE — Patient Instructions (Signed)
Medication Instructions:  Your physician has recommended you make the following change in your medication:  START Ranexa 500mg  BID  *If you need a refill on your cardiac medications before your next appointment, please call your pharmacy*  Lab Work: No lab work today.  If you have labs (blood work) drawn today and your tests are completely normal, you will receive your results only by: Marland Kitchen MyChart Message (if you have MyChart) OR . A paper copy in the mail If you have any lab test that is abnormal or we need to change your treatment, we will call you to review the results.  Testing/Procedures: You had an EKG today. It looked good!  Follow-Up: At [facility], you and your health needs are our priority.  As part of our continuing mission to provide you with exceptional heart care, we have created designated Provider Care Teams.  These Care Teams include your primary Cardiologist (physician) and Advanced Practice Providers (APPs -  Physician Assistants and Nurse Practitioners) who all work together to provide you with the care you need, when you need it.  Your next appointment:   1 month  The format for your next appointment:   In Person  Provider:   You may see Shirlee More, MD or the following Advanced Practice Provider on your designated Care Team:    Laurann Montana, FNP  Other Instructions  I will call cardiac rehab at [facility] to try to get financial assistance for you to participate in cardiac rehab.   You have been provided with Vascepa samples today. Try to use th eprovided discount card at the pharmacy as well.   [facility] in [place] is a wonderful free clinic that may be able to offer you additional resources.

## 2018-12-03 NOTE — Progress Notes (Signed)
Office Visit    Patient Name: Gabrielle Page Date of Encounter: 12/03/2018  Primary Care Provider:  Jaclynn Major, NP Primary Cardiologist:  Shirlee More, MD Electrophysiologist:  None   Chief Complaint    Gabrielle Page is a 58 y.o. female with a hx of CAD, HTN, HLD presents today for follow up after cardiac catheterization and MI.   Past Medical History    Past Medical History:  Diagnosis Date   Asthma    Chronic bronchitis ([place])    "usually get it q yr" (11/05/2016)   GERD (gastroesophageal reflux disease)    Headache    "weekly" (11/05/2016)   Heart murmur    "dx'd when I was a child"   Hepatitis 1991   "when I had my gallbladder out" (11/05/2016)   History of blood transfusion 1985   S/P childbirth   History of kidney stones    HLD (hyperlipidemia) 11/27/2018   NSTEMI (non-ST elevated myocardial infarction) ([place]) 11/05/2016   9/18 PCI/DESx1 to p/mLAD   Pancreatitis 1991   Pneumonia    "twice" (11/05/2016)   Past Surgical History:  Procedure Laterality Date   [place]   "reconstruction"   [place] WITH STENT PLACEMENT  11/05/2016   CORONARY ARTERY BYPASS GRAFT N/A 03/26/2017   Procedure: CORONARY ARTERY BYPASS GRAFTING (CABG) x 3 WITH ENDOSCOPIC HARVESTING OF RIGHT SAPHENOUS VEIN;  Surgeon: Ivin Poot, MD;  Location: [facility];  Service: Open Heart Surgery;  Laterality: N/A;   CORONARY STENT INTERVENTION N/A 11/05/2016   Procedure: CORONARY STENT INTERVENTION;  Surgeon: Martinique, Peter M, MD;  Location: [facility];  Service: Cardiovascular;  Laterality: N/A;   CYSTOSCOPY WITH STENT PLACEMENT  "3 times" (11/05/2016)   [place] ULTRASOUND/IVUS N/A 03/22/2017   Procedure: Intravascular Ultrasound/IVUS;  Surgeon: Martinique, Peter M, MD;   Location: [facility];  Service: Cardiovascular;  Laterality: N/A;   KNEE ARTHROSCOPY Right 2000s   LEFT HEART CATH AND CORONARY ANGIOGRAPHY N/A 11/05/2016   Procedure: LEFT HEART CATH AND CORONARY ANGIOGRAPHY;  Surgeon: Martinique, Peter M, MD;  Location: [facility];  Service: Cardiovascular;  Laterality: N/A;   LEFT HEART CATH AND CORONARY ANGIOGRAPHY N/A 03/22/2017   Procedure: LEFT HEART CATH AND CORONARY ANGIOGRAPHY;  Surgeon: Martinique, Peter M, MD;  Location: [facility];  Service: Cardiovascular;  Laterality: N/A;   NASAL SINUS SURGERY  ~ 2009   "exposed to mildew; had to have the infectoin drained; cut the outside of my face"   OVARIAN CYST SURGERY  X 6   RIGHT/LEFT HEART CATH AND CORONARY/GRAFT ANGIOGRAPHY N/A 11/24/2018   Procedure: RIGHT/LEFT HEART CATH AND CORONARY/GRAFT ANGIOGRAPHY;  Surgeon: Leonie Man, MD;  Location: [facility];  Service: Cardiovascular;  Laterality: N/A;   TEE WITHOUT CARDIOVERSION N/A 03/26/2017   Procedure: TRANSESOPHAGEAL ECHOCARDIOGRAM (TEE);  Surgeon: Prescott Gum, [name], MD;  Location: [facility];  Service: Open Heart Surgery;  Laterality: N/A;    Allergies  Allergies  Allergen Reactions   Amiodarone Diarrhea and Nausea And Vomiting    In combination with colchicine   Colchicine Diarrhea and Nausea And Vomiting    Pt states she had rnx to medication; severe V/D   Compazine [Prochlorperazine Edisylate] Other (See Comments)    SEIZURE   Other  Other (See Comments)    Steroids cause uncontrollable coughing    History of Present Illness    Gabrielle Page is a 58 y.o. female with a hx of CAD (NSTEMI PCI/DES of LAD 10/2016, CABG 03/26/17 with LIMA to LAD, SVG to Diag, and SVG to OM), postoperative PAF/pericardial effusion, HLD, LBBB, diastolic dysfunction last seen while hospitalized.   She underwent cardiac catheterization 11/24/18 and had a periprocedure Type 4 MI- "occluded occlusion of native OM1 at anastomosis  site. Attempted PTCA of 99% lesion, unable to advance, with wire removal there was acute occlusion of downstream OM", occlused SVG-OM and SVG-diagonal, small LIMA-LAD. No evidence of CHF.  Chest pain every now and then. Notices when she gets up and does things like make the bed. Pain described as sore, achey. She has not required her nitroglycerin, but has it if she is available.   She has been walkig 10 minutes twice per day. Encouraged her to continue her exercise regimen.   She has applied for disability and is working with an attorney to get this approved. Presently has financial burden to afford her medications. She was referred to [facility] in [place] and is interested in participating, but concerned regarding the cost. Will request to see if they can offer financial assistance for her.   She denies palpitations, edema, PND, orthopnea.   EKGs/Labs/Other Studies Reviewed:   The following studies were reviewed today: cardiac cath  11/24/18  Dist LM lesion is 40% stenosed.  Ost LAD lesion is 80% stenosed. -As noted by Dr. Martinique and regional cath  Prox LAD to Mid LAD stent across the diagonal is widely patent  Mid LAD-1 lesion is 60% stenosed. Mid LAD-2 lesion is 80% stenosed.  Ost 1st Diag lesion is 60% stenosed.  LIMA graft was visualized by angiography and is small -to a relatively small caliber downstream LAD.  SVG-OM1 graft was visualized by angiography and is normal in caliber. Origin to Prox Graft lesion is 100% stenosed.  SVG-1ST DIAG graft was visualized by angiography and is normal in caliber. Origin to Prox Graft lesion is 100% stenosed.  1st Mrg lesion is 99% stenosed at the graft insertion site  Balloon angioplasty was attempted, lesion could not be crossed. Unfortunately lesion progressed.  Post intervention, there is a 100% residual stenosis. After multiple attempts to cross, procedure aborted.  ------------------------------------------  LV end diastolic pressure  is normal. Right heart pressures normal.  Mildly reduced cardiac output/index   SUMMARY  Periprocedure Type 4 MI -with now occluded occlusion of native OM1 at anastomosis site.  Attempted PTCA of 99% lesion, unable to advance wire beyond the lesion likely due to suture stricture.  With wire removal there was now acute occlusion of the downstream OM.  Occluded SVG-OM and SVG-diagonal  Patent but very small LIMA-LAD with competitive flow to the distal LAD.  Likely significant ostial LAD lesion but with TIMI-3 flow in the diagonal branch.  The LAD itself tapers to a relatively small caliber distally with competitive flow.  The stents itself is widely patent  Totally normal right heart cath pressures with no evidence of CHF.  Right atrial, RVEDP, LVEDP and PCWP pressures are all normal.  Mildly reduced cardiac output of 2.44 with an index of 2.91.     RECOMMENDATIONS:   Unfortunately it seems like the patient will likely complete an inferolateral infarct with occluded OM.  She will be admitted to the ICU for management.  We will start IV heparin 6 hours post sheath removal.  If blood pressure tolerates, will use IV nitroglycerin, but otherwise will use IV and oral narcotics for pain control.  Cycle troponin levels.  Restart most home medications with exception of Lasix.  I suspect that she should be in the hospital least 2 to 3 days to for completion of her infarct.  Monitor for signs of hypotension etc.   [name], MD          ECHO 11/25/2018 IMPRESSIONS  1. Left ventricular ejection fraction, by visual estimation, is 55 to 60%. The left ventricle has normal function. Normal left ventricular size. There is mildly increased left ventricular hypertrophy.  2. Elevated mean left atrial pressure.  3. Left ventricular diastolic Doppler parameters are consistent with impaired relaxation pattern of LV diastolic filling.  4. Global right ventricle has mildly reduced systolic  function.The right ventricular size is normal. No increase in right ventricular wall thickness.  5. Left atrial size was mildly dilated.  6. Right atrial size was normal.  7. The mitral valve is abnormal. Mild to moderate mitral valve regurgitation.  8. The tricuspid valve is abnormal. Tricuspid valve regurgitation mild-moderate.  9. Normal pulmonary artery systolic pressure. RVSP 28 mmHg 10. The inferior vena cava is normal in size with <50% respiratory variability, suggesting right atrial pressure of 8 mmHg. 11. Abnormal septal motion consistent with left bundle branch block.    Echo 10/14/2018:  1. The left ventricle has normal systolic function with an ejection fraction of 60-65%. The cavity size was normal. There is mild concentric left ventricular hypertrophy. Left ventricular diastolic Doppler parameters are consistent with  pseudonormalization. Elevated mean left atrial pressure There is abnormal septal motion consistent with left bundle branch block. No evidence of left ventricular regional wall motion abnormalities.  2. The right ventricle has normal systolic function. The cavity was normal. There is no increase in right ventricular wall thickness.  3. The aortic root and ascending aorta are normal in size and structure.  4. The aortic valve is tricuspid. No stenosis of the aortic valve.  5. Mitral valve regurgitation is mild to moderate by color flow Doppler. No evidence of mitral valve stenosis.   EKG:  EKG is  ordered today.  The ekg ordered today demonstrates SR with LBBB.  Recent Labs: 11/13/2018: NT-Pro BNP 305 11/27/2018: BUN 7; Creatinine, Ser 0.60; Hemoglobin 9.1; Platelets 234; Potassium 3.9; Sodium 138  Recent Lipid Panel    Component Value Date/Time   CHOL 145 11/26/2018 0452   TRIG 225 (H) 11/26/2018 0452   HDL 30 (L) 11/26/2018 0452   CHOLHDL 4.8 11/26/2018 0452   VLDL 45 (H) 11/26/2018 0452   LDLCALC 70 11/26/2018 0452   Home Medications   Current Meds    Medication Sig   acetaminophen (TYLENOL) 325 MG tablet Take 325 mg by mouth every 6 (six) hours as needed for moderate pain or headache.    albuterol (VENTOLIN HFA) 108 (90 Base) MCG/ACT inhaler Inhale 2 puffs into the lungs every 6 (six) hours as needed for wheezing or shortness of breath.   aspirin EC 81 MG tablet Take 81 mg by mouth daily.   atorvastatin (LIPITOR) 80 MG tablet Take 1 tablet (80 mg total) by mouth daily at 6 PM.   busPIRone (BUSPAR) 5 MG tablet Take 5 mg by mouth 3 (three) times daily.   clopidogrel (PLAVIX) 75 MG tablet Take 1 tablet (75 mg total) by mouth daily with breakfast.   metoprolol tartrate (LOPRESSOR) 25 MG tablet Take 1 tablet (25 mg  total) by mouth 2 (two) times daily.   Multiple Vitamin (MULTIVITAMIN WITH MINERALS) TABS tablet Take 1 tablet by mouth daily.   nitroGLYCERIN (NITROSTAT) 0.4 MG SL tablet Place 0.4 mg under the tongue every 5 (five) minutes as needed for chest pain.   traZODone (DESYREL) 50 MG tablet Take 50 mg by mouth at bedtime.       Review of Systems       Review of Systems  Constitution: Negative for chills, fever and malaise/fatigue.  Cardiovascular: Positive for chest pain and dyspnea on exertion. Negative for irregular heartbeat, near-syncope, palpitations and syncope.  Respiratory: Negative for cough, shortness of breath and wheezing.   Gastrointestinal: Negative for nausea and vomiting.  Neurological: Negative for dizziness, light-headedness and weakness.   All other systems reviewed and are otherwise negative except as noted above.  Physical Exam    VS:  BP 118/68 (BP Location: Right Arm, Patient Position: Sitting, Cuff Size: Normal) Comment (Patient Position): SITTING   Pulse 64    Ht 5\' 3"  (1.6 m)    Wt 116 lb (52.6 kg)    SpO2 98%    BMI 20.55 kg/m  , BMI Body mass index is 20.55 kg/m. GEN: Well nourished, well developed, in no acute distress. HEENT: normal. Neck: Supple, no JVD, carotid bruits, or  masses. Cardiac: RRR, no murmurs, rubs, or gallops. No clubbing, cyanosis, edema.  Radials/DP/PT 2+ and equal bilaterally.  Respiratory:  Respirations regular and unlabored, clear to auscultation bilaterally. GI: Soft, nontender, nondistended, BS + x 4. MS: No deformity or atrophy. Skin: Warm and dry, no rash. L radial cath site clean, dry, intact with no signs or symptoms of infection.  Neuro:  Strength and sensation are intact. Psych: Normal affect.  Accessory Clinical Findings    ECG personally reviewed by me today - SR with known LBBB - no acute changes.  Assessment & Plan    1. CAD - Stable anginal symptoms. NSTEMI PCI/DES LAD 10/2016, CABG 03/26/17 LIMA LAD, SVG diag, SVG OM. Recent cath 11/24/18 with peri-procedure type 4 MI - now occluded oclcusion OM1 at anastamosis site/downstream OM, occluded SVG-OM and SVG-diagonal, patent but small LIMA-LAD, normal right heart pressures no evidence of CHF.  GDMT  Aspirin, beta blocker, statin, plavix, lovaza, PRN nitroglycerin  Start Ranexa 500mg  BID. Hopeful her [employer] will cover, otherwise may require patient assistance.   Request financial assistance from cardiac rehab at [facility] so she can participate in the program.   2. LBBB - Known hx. Stable on EKG. Continue to monitor with EKG every 6 months.   3. HLD, LDL goal <70 - Lipid profile 11/26/18 with LDL 70, triglycerides 225, cholesterol 145, HDL 30. Started on Vascepa while hospitalized, but unable to afford. Samples and coupon provided in office today. Atorvastatin increased to 80mg  daily while hospitalized. Lipid/liver panel at office visit in 4 weeks.   Disposition: Follow up in 4 week(s) with Dr. Earney Navy, NP 12/03/2018, 4:35 PM

## 2018-12-04 ENCOUNTER — Telehealth: Payer: Self-pay | Admitting: Family

## 2018-12-04 NOTE — Telephone Encounter (Signed)
Spoke with cardiac rehab at [facility] and patient assistance Scott. Tells me Miss Mcmains has been approved for charity care 11/04/18 and this care extends through 10/2019. This charity care includes cardiac rehab.   I have faxed office note, echo, cath report, and referral form to cardiac rehab at [place], NP

## 2018-12-08 ENCOUNTER — Encounter (HOSPITAL_COMMUNITY): Payer: Self-pay | Admitting: Cardiology

## 2018-12-24 ENCOUNTER — Other Ambulatory Visit: Payer: Self-pay

## 2018-12-24 ENCOUNTER — Ambulatory Visit (INDEPENDENT_AMBULATORY_CARE_PROVIDER_SITE_OTHER): Payer: Self-pay

## 2018-12-24 ENCOUNTER — Encounter: Payer: Self-pay | Admitting: Cardiology

## 2018-12-24 ENCOUNTER — Ambulatory Visit (INDEPENDENT_AMBULATORY_CARE_PROVIDER_SITE_OTHER): Payer: Self-pay | Admitting: Cardiology

## 2018-12-24 VITALS — BP 102/62 | HR 59 | Ht 63.0 in | Wt 124.1 lb

## 2018-12-24 DIAGNOSIS — D649 Anemia, unspecified: Secondary | ICD-10-CM

## 2018-12-24 DIAGNOSIS — E785 Hyperlipidemia, unspecified: Secondary | ICD-10-CM

## 2018-12-24 DIAGNOSIS — I447 Left bundle-branch block, unspecified: Secondary | ICD-10-CM

## 2018-12-24 DIAGNOSIS — R002 Palpitations: Secondary | ICD-10-CM

## 2018-12-24 DIAGNOSIS — I5032 Chronic diastolic (congestive) heart failure: Secondary | ICD-10-CM

## 2018-12-24 DIAGNOSIS — I25708 Atherosclerosis of coronary artery bypass graft(s), unspecified, with other forms of angina pectoris: Secondary | ICD-10-CM

## 2018-12-24 NOTE — Patient Instructions (Signed)
Medication Instructions:  Your physician recommends that you continue on your current medications as directed. Please refer to the Current Medication list given to you today.  *If you need a refill on your cardiac medications before your next appointment, please call your pharmacy*  Lab Work: Your physician recommends that you return for lab work today: vitamin B12, folate, iron, TIBC, ferratin levels, CBC.   If you have labs (blood work) drawn today and your tests are completely normal, you will receive your results only by: Marland Kitchen MyChart Message (if you have MyChart) OR . A paper copy in the mail If you have any lab test that is abnormal or we need to change your treatment, we will call you to review the results.  Testing/Procedures: You had an EKG today.   Your physician has recommended that you wear a ZIO monitor. ZIO monitors are medical devices that record the heart's electrical activity. Doctors most often use these monitors to diagnose arrhythmias. Arrhythmias are problems with the speed or rhythm of the heartbeat. The monitor is a small, portable device. You can wear one while you do your normal daily activities. This is usually used to diagnose what is causing palpitations/syncope (passing out). Wear for 3 days.   Follow-Up: At [facility], you and your health needs are our priority.  As part of our continuing mission to provide you with exceptional heart care, we have created designated Provider Care Teams.  These Care Teams include your primary Cardiologist (physician) and Advanced Practice Providers (APPs -  Physician Assistants and Nurse Practitioners) who all work together to provide you with the care you need, when you need it.  Your next appointment:   4 weeks  The format for your next appointment:   In Person  Provider:   Shirlee More, MD

## 2018-12-24 NOTE — Progress Notes (Signed)
Cardiology Office Note:    Date:  12/24/2018   ID:  Gabrielle Page, DOB [birth year], MRN [id number]  PCP:  Jaclynn Major, NP  Cardiologist:  Shirlee More, MD    Referring MD: Jaclynn Major, NP    ASSESSMENT:    1. Palpitations   2. Anemia, unspecified type   3. Coronary artery disease of bypass graft of native heart with stable angina pectoris ([place])   4. LBBB (left bundle branch block)   5. Dyslipidemia   6. Chronic diastolic heart failure (HCC)    PLAN:    In order of problems listed above:  1. Unsure whether this is arrhythmia or anxiety check a 3-day ZIO monitor continue her beta-blocker 2. Initiate evaluation check and iron stores ferritin 123456 folic acid and continue DC multivitamin 3. Recently stable I do not think redo bypass surgery is a good option and does not need further percutaneous intervention.  Continue treatment including dual antiplatelet statin ranolazine beta-blocker I offered nitrates but she has developed severe headache and I do not think we should use it and with a very small body mass I am not can uptitrate ranolazine at this time. 4. Stable EKG pattern 5. Good control lipids at target continue statin 6. Compensated presently not on a loop diuretic   Next appointment: 6 weeks   Medication Adjustments/Labs and Tests Ordered: Current medicines are reviewed at length with the patient today.  Concerns regarding medicines are outlined above.  No orders of the defined types were placed in this encounter.  No orders of the defined types were placed in this encounter.   No chief complaint on file.   History of Present Illness:    Gabrielle Page is a 58 y.o. female with a hx of CAD (NSTEMI PCI/DES of LAD 10/2016, CABG 03/26/17 with LIMA to LAD, SVG to Diag, and SVG to OM), postoperative PAF/pericardial effusion, HLD, LBBB, diastolic dysfunction  last seen 12/03/2018.She underwent cardiac catheterization 11/24/18 and had a  periprocedure Type 4 MI- "occluded occlusion of native OM1 at anastomosis site. Attempted PTCA of 99% lesion, unable to advance, with wire removal there was acute occlusion of downstream OM", occlused SVG-OM and SVG-diagonal, small LIMA-LAD. No evidence of CHF.   Compliance with diet, lifestyle and medications: Yes  Processes been difficult for her and she is disappointed by the results of her coronary angiogram showing vein graft occlusion and a periprocedural MI.  The good news is her lipids are on target she is initiated cardiac rehabilitation which I think she needs for psychology exercise training and nutritional counseling.  She is having frequent episodes of chest pain not severe not typical angina and has not needed nitroglycerin.  Her predominant complaint is palpitation it occurs at night her heart is forceful she is frightened and is having trouble sleeping.  I am unsure whether this is anxiety or arrhythmia will apply a 3-day ZIO monitor.  She is significantly anemic hemoglobin of 9.1 she is normocytic and will check iron studies [id number] and folic acid.  She did not have hemodynamics of decompensated heart failure she is not having edema or orthopnea.  Is optimistic that she will improve with medical therapy and cardiac rehabilitation.  I will research whether or still doing external counterpulsation that can be helpful in these individuals Past Medical History:  Diagnosis Date  . Asthma   . Chronic bronchitis ([place])    "usually get it q yr" (11/05/2016)  . GERD (gastroesophageal reflux disease)   . Headache    "  weekly" (11/05/2016)  . Heart murmur    "dx'd when I was a child"  . Hepatitis 1991   "when I had my gallbladder out" (11/05/2016)  . History of blood transfusion 1985   S/P childbirth  . History of kidney stones   . HLD (hyperlipidemia) 11/27/2018  . NSTEMI (non-ST elevated myocardial infarction) ([place]) 11/05/2016   9/18 PCI/DESx1 to p/mLAD  . Pancreatitis 1991  . Pneumonia     "twice" (11/05/2016)    Past Surgical History:  Procedure Laterality Date  . ABDOMINAL HYSTERECTOMY  1990  . APPENDECTOMY  1992  . BILE DUCT EXPLORATION  1991   "reconstruction"  . CESAREAN SECTION  1985  . CHOLECYSTECTOMY OPEN  1991  . CORONARY ANGIOPLASTY WITH STENT PLACEMENT  11/05/2016  . CORONARY ARTERY BYPASS GRAFT N/A 03/26/2017   Procedure: CORONARY ARTERY BYPASS GRAFTING (CABG) x 3 WITH ENDOSCOPIC HARVESTING OF RIGHT SAPHENOUS VEIN;  Surgeon: Ivin Poot, MD;  Location: [facility];  Service: Open Heart Surgery;  Laterality: N/A;  . CORONARY STENT INTERVENTION N/A 11/05/2016   Procedure: CORONARY STENT INTERVENTION;  Surgeon: Martinique, Peter M, MD;  Location: [facility];  Service: Cardiovascular;  Laterality: N/A;  . CYSTOSCOPY WITH STENT PLACEMENT  "3 times" (11/05/2016)  . DILATION AND CURETTAGE OF UTERUS  1989  . INTRAVASCULAR ULTRASOUND/IVUS N/A 03/22/2017   Procedure: Intravascular Ultrasound/IVUS;  Surgeon: Martinique, Peter M, MD;  Location: [facility];  Service: Cardiovascular;  Laterality: N/A;  . KNEE ARTHROSCOPY Right 2000s  . LEFT HEART CATH AND CORONARY ANGIOGRAPHY N/A 11/05/2016   Procedure: LEFT HEART CATH AND CORONARY ANGIOGRAPHY;  Surgeon: Martinique, Peter M, MD;  Location: [facility];  Service: Cardiovascular;  Laterality: N/A;  . LEFT HEART CATH AND CORONARY ANGIOGRAPHY N/A 03/22/2017   Procedure: LEFT HEART CATH AND CORONARY ANGIOGRAPHY;  Surgeon: Martinique, Peter M, MD;  Location: [facility];  Service: Cardiovascular;  Laterality: N/A;  . NASAL SINUS SURGERY  ~ 2009   "exposed to mildew; had to have the infectoin drained; cut the outside of my face"  . OVARIAN CYST SURGERY  X 6  . RIGHT/LEFT HEART CATH AND CORONARY/GRAFT ANGIOGRAPHY N/A 11/24/2018   Procedure: RIGHT/LEFT HEART CATH AND CORONARY/GRAFT ANGIOGRAPHY;  Surgeon: Leonie Man, MD;  Location: [facility];  Service: Cardiovascular;  Laterality: N/A;  . TEE WITHOUT CARDIOVERSION  N/A 03/26/2017   Procedure: TRANSESOPHAGEAL ECHOCARDIOGRAM (TEE);  Surgeon: Prescott Gum, [name], MD;  Location: [facility];  Service: Open Heart Surgery;  Laterality: N/A;    Current Medications: Current Meds  Medication Sig  . acetaminophen (TYLENOL) 325 MG tablet Take 325 mg by mouth every 6 (six) hours as needed for moderate pain or headache.   . albuterol (VENTOLIN HFA) 108 (90 Base) MCG/ACT inhaler Inhale 2 puffs into the lungs every 6 (six) hours as needed for wheezing or shortness of breath.  Marland Kitchen aspirin EC 81 MG tablet Take 81 mg by mouth daily.  Marland Kitchen atorvastatin (LIPITOR) 80 MG tablet Take 1 tablet (80 mg total) by mouth daily at 6 PM.  . busPIRone (BUSPAR) 15 MG tablet Take 15 mg by mouth 3 (three) times daily.   . clopidogrel (PLAVIX) 75 MG tablet Take 1 tablet (75 mg total) by mouth daily with breakfast.  . Icosapent Ethyl 1 g CAPS Take 2 capsules (2 g total) by mouth 2 (two) times daily.  . metoprolol tartrate (LOPRESSOR) 25 MG tablet Take 1 tablet (25 mg total) by mouth 2 (  two) times daily.  . Multiple Vitamin (MULTIVITAMIN WITH MINERALS) TABS tablet Take 1 tablet by mouth daily.  . nitroGLYCERIN (NITROSTAT) 0.4 MG SL tablet Place 0.4 mg under the tongue every 5 (five) minutes as needed for chest pain.  . ranolazine (RANEXA) 500 MG 12 hr tablet Take 1 tablet (500 mg total) by mouth 2 (two) times daily.  . traZODone (DESYREL) 50 MG tablet Take 75 mg by mouth at bedtime.      Allergies:   Amiodarone, Colchicine, Compazine [prochlorperazine edisylate], and Other   Social History   Socioeconomic History  . Marital status: Divorced    Spouse name: Not on file  . Number of children: Not on file  . Years of education: Not on file  . Highest education level: Not on file  Occupational History  . Not on file  Social Needs  . Financial resource strain: Not on file  . Food insecurity    Worry: Not on file    Inability: Not on file  . Transportation needs    Medical: Not on file     Non-medical: Not on file  Tobacco Use  . Smoking status: Never Smoker  . Smokeless tobacco: Never Used  Substance and Sexual Activity  . Alcohol use: Yes    Comment: 11/05/2016 "maybe 1 glass of wine/month"  . Drug use: No  . Sexual activity: Not Currently  Lifestyle  . Physical activity    Days per week: Not on file    Minutes per session: Not on file  . Stress: Not on file  Relationships  . Social Herbalist on phone: Not on file    Gets together: Not on file    Attends religious service: Not on file    Active member of club or organization: Not on file    Attends meetings of clubs or organizations: Not on file    Relationship status: Not on file  Other Topics Concern  . Not on file  Social History Narrative  . Not on file     Family History: The patient's family history includes Alcohol abuse in her father; Diabetes Mellitus II in her father; Heart attack in her father; Hypertension in her father; Suicidality in her mother. ROS:   Please see the history of present illness.    All other systems reviewed and are negative.  EKGs/Labs/Other Studies Reviewed:    The following studies were reviewed today:  EKG:  EKG ordered today and personally reviewed.  The ekg ordered today demonstrates left bundle branch block sinus rhythm  Recent Labs: 11/13/2018: NT-Pro BNP 305 11/27/2018: BUN 7; Creatinine, Ser 0.60; Hemoglobin 9.1; Platelets 234; Potassium 3.9; Sodium 138  Recent Lipid Panel    Component Value Date/Time   CHOL 145 11/26/2018 0452   TRIG 225 (H) 11/26/2018 0452   HDL 30 (L) 11/26/2018 0452   CHOLHDL 4.8 11/26/2018 0452   VLDL 45 (H) 11/26/2018 0452   LDLCALC 70 11/26/2018 0452    Physical Exam:    VS:  BP 102/62 (BP Location: Right Arm, Patient Position: Sitting, Cuff Size: Normal)   Pulse (!) 59   Ht 5\' 3"  (1.6 m)   Wt 124 lb 1.9 oz (56.3 kg)   SpO2 97%   BMI 21.99 kg/m     Wt Readings from Last 3 Encounters:  12/24/18 124 lb 1.9 oz (56.3  kg)  12/03/18 116 lb (52.6 kg)  11/27/18 123 lb 6.4 oz (56 kg)     GEN: Pallor  the skin flat affect mood is depressed well nourished, well developed in no acute distress HEENT: Normal NECK: No JVD; No carotid bruits LYMPHATICS: No lymphadenopathy CARDIAC: S2 paradoxical RRR, no murmurs, rubs, gallops RESPIRATORY:  Clear to auscultation without rales, wheezing or rhonchi  ABDOMEN: Soft, non-tender, non-distended MUSCULOSKELETAL:  No edema; No deformity  SKIN: Warm and dry NEUROLOGIC:  Alert and oriented x 3 PSYCHIATRIC:  Normal affect    Signed, Shirlee More, MD  12/24/2018 8:40 AM    [place]

## 2018-12-25 ENCOUNTER — Telehealth: Payer: Self-pay | Admitting: *Deleted

## 2018-12-25 LAB — IRON,TIBC AND FERRITIN PANEL
Ferritin: 17 ng/mL (ref 15–150)
Iron Saturation: 13 % — ABNORMAL LOW (ref 15–55)
Iron: 49 ug/dL (ref 27–159)
Total Iron Binding Capacity: 385 ug/dL (ref 250–450)
UIBC: 336 ug/dL (ref 131–425)

## 2018-12-25 LAB — B12 AND FOLATE PANEL
Folate: 7.6 ng/mL (ref >3.0)
Vitamin B-12: 560 pg/mL (ref 232–1245)

## 2018-12-25 LAB — CBC
Hematocrit: 29.1 % — ABNORMAL LOW (ref 34.0–46.6)
Hemoglobin: 9.6 g/dL — ABNORMAL LOW (ref 11.1–15.9)
MCH: 31.3 pg (ref 26.6–33.0)
MCHC: 33 g/dL (ref 31.5–35.7)
MCV: 95 fL (ref 79–97)
Platelets: 275 10*3/uL (ref 150–450)
RBC: 3.07 x10E6/uL — ABNORMAL LOW (ref 3.77–5.28)
RDW: 14.2 % (ref 11.7–15.4)
WBC: 7.6 10*3/uL (ref 3.4–10.8)

## 2018-12-25 MED ORDER — POLYETHYLENE GLYCOL 3350 17 GM/SCOOP PO POWD: 1.0000 | Freq: Every day | ORAL | 0 refills | Status: DC | PRN

## 2018-12-25 MED ORDER — IRON 325 (65 FE) MG PO TABS: 325.0000 mg | ORAL_TABLET | Freq: Two times a day (BID) | ORAL | 0 refills | Status: DC

## 2018-12-25 NOTE — Telephone Encounter (Signed)
Patient informed of lab results and advised to start iron supplements 325 mg twice daily and use over the counter Miralax as needed. Advised patient if constipation becomes a problem, she can reduce iron supplement dose to once daily. Patient is agreeable and verbalized understanding. No further questions.

## 2018-12-25 NOTE — Telephone Encounter (Signed)
-----   Message from Richardo Priest, MD sent at 12/25/2018 12:01 PM EST ----- Normal or stable result  Indeed has a suspected she is iron deficient please have her start iron 5 grains twice daily she may need to use a laxative like MiraLAX with it as it is constipating and if she is having trouble she can reduce to 1/day.  If she does not respond to oral there is an intravenous prep we can use.

## 2018-12-29 ENCOUNTER — Telehealth: Payer: Self-pay | Admitting: Cardiology

## 2018-12-29 NOTE — Telephone Encounter (Signed)
Patient continues to have frequent episodes of palpitations and reports that she wore the ZIO monitor as prescribed and mailed it back on Saturday, 12/27/2018. Advised Dr. Bettina Gavia will review those results as soon as they are available.   Patient started cardiac rehab on Friday, 12/26/2018, and went for another session today. While exercising today, she reports her blood pressure dropped to 144/60 so they stopped her session and she was told to contact our office.   Patient has gained 3 pounds overnight. Her "stomach is swelling" and her shortness of breath is worsening. Patient states she took one dose of furosemide 20 mg this afternoon and is already starting to feel better. Furosemide 20 mg and spironolactone 12.5 mg were discontinued upon hospital discharge on 11/27/2018.   Reviewed with Dr. Harriet Masson who advised for patient to restart furosemide 20 mg daily. Informed patient to continue monitoring her daily weights, BP, and HR. She will contact our office with any further questions or concerns.

## 2018-12-29 NOTE — Telephone Encounter (Signed)
Patient states sob and palpitations, rehab stopped due to BP

## 2019-01-19 MED FILL — RANOLAZINE ER 500 MG TABLET: 500 | 30 days supply | Qty: 60 | Fill #1

## 2019-01-28 NOTE — Progress Notes (Signed)
Cardiology Office Note:    Date:  01/29/2019   ID:  Gabrielle Page, DOB 1960/10/26, MRN [id number]  PCP:  Jaclynn Major, NP  Cardiologist:  Shirlee More, MD    Referring MD: Jaclynn Major, NP    ASSESSMENT:    1. Anemia, unspecified type   2. Coronary artery disease of bypass graft of native heart with stable angina pectoris ([place])   3. Dyslipidemia    PLAN:    In order of problems listed above:  1. She is iron deficient significant comorbidity recheck a CBC and if not improving I will make arrangements for IV iron. 2. Stable CAD recent cath showed no opportunities for further revascularization and she has occlusion of her saphenous vein grafts.  She is participating in cardiac rehabilitation and is slowly steadily improving. 3. Stable continue her statin   Next appointment: 6 weeks   Medication Adjustments/Labs and Tests Ordered: Current medicines are reviewed at length with the patient today.  Concerns regarding medicines are outlined above.  No orders of the defined types were placed in this encounter.  No orders of the defined types were placed in this encounter.   Chief Complaint  Patient presents with  . Follow-up  . Coronary Artery Disease  . Anemia    History of Present Illness:    Gabrielle Page is a 58 y.o. female with a hx of CAD (NSTEMI PCI/DES of LAD 10/2016, CABG 03/26/17 with LIMA to LAD, SVG to Diag, and SVG to OM), postoperative PAF/pericardial effusion, HLD, LBBB, diastolic dysfunction  last seen 12/03/2018.She underwent cardiac catheterization 11/24/18 and had a periprocedure Type 4 MI- "occluded occlusion of native OM1 at anastomosis site. Attempted PTCA of 99% lesion, unable to advance, with wire removal there was acute occlusion of downstream OM", occlused SVG-OM and SVG-diagonal, small LIMA-LAD. No evidence of CHF.   She was last seen 12/24/2018. Compliance with diet, lifestyle and medications: Yes  She is participating in  cardiac rehabilitation she has fatigue exercise intolerance and has nonanginal chest pain relieved without nitroglycerin.  She has a lot of barriers including social and psychological the been addressed in cardiac rehab her attitude is good no edema orthopnea syncope.  [place]iffen comorbidity is iron deficiency anemia with low iron binding capacity percentage and ferritin.  She is on iron supplement Past Medical History:  Diagnosis Date  . Asthma   . Chronic bronchitis ([place])    "usually get it q yr" (11/05/2016)  . GERD (gastroesophageal reflux disease)   . Headache    "weekly" (11/05/2016)  . Heart murmur    "dx'd when I was a child"  . Hepatitis 1991   "when I had my gallbladder out" (11/05/2016)  . History of blood transfusion 1985   S/P childbirth  . History of kidney stones   . HLD (hyperlipidemia) 11/27/2018  . NSTEMI (non-ST elevated myocardial infarction) ([place]) 11/05/2016   9/18 PCI/DESx1 to p/mLAD  . Pancreatitis 1991  . Pneumonia    "twice" (11/05/2016)    Past Surgical History:  Procedure Laterality Date  . ABDOMINAL HYSTERECTOMY  1990  . APPENDECTOMY  1992  . BILE DUCT EXPLORATION  1991   "reconstruction"  . CESAREAN SECTION  1985  . CHOLECYSTECTOMY OPEN  1991  . CORONARY ANGIOPLASTY WITH STENT PLACEMENT  11/05/2016  . CORONARY ARTERY BYPASS GRAFT N/A 03/26/2017   Procedure: CORONARY ARTERY BYPASS GRAFTING (CABG) x 3 WITH ENDOSCOPIC HARVESTING OF RIGHT SAPHENOUS VEIN;  Surgeon: Ivin Poot, MD;  Location: [facility];  Service: Open  Heart Surgery;  Laterality: N/A;  . CORONARY STENT INTERVENTION N/A 11/05/2016   Procedure: CORONARY STENT INTERVENTION;  Surgeon: Martinique, Peter M, MD;  Location: [facility];  Service: Cardiovascular;  Laterality: N/A;  . CYSTOSCOPY WITH STENT PLACEMENT  "3 times" (11/05/2016)  . DILATION AND CURETTAGE OF UTERUS  1989  . INTRAVASCULAR ULTRASOUND/IVUS N/A 03/22/2017   Procedure: Intravascular Ultrasound/IVUS;  Surgeon: Martinique, Peter M, MD;   Location: [facility];  Service: Cardiovascular;  Laterality: N/A;  . KNEE ARTHROSCOPY Right 2000s  . LEFT HEART CATH AND CORONARY ANGIOGRAPHY N/A 11/05/2016   Procedure: LEFT HEART CATH AND CORONARY ANGIOGRAPHY;  Surgeon: Martinique, Peter M, MD;  Location: [facility];  Service: Cardiovascular;  Laterality: N/A;  . LEFT HEART CATH AND CORONARY ANGIOGRAPHY N/A 03/22/2017   Procedure: LEFT HEART CATH AND CORONARY ANGIOGRAPHY;  Surgeon: Martinique, Peter M, MD;  Location: [facility];  Service: Cardiovascular;  Laterality: N/A;  . NASAL SINUS SURGERY  ~ 2009   "exposed to mildew; had to have the infectoin drained; cut the outside of my face"  . OVARIAN CYST SURGERY  X 6  . RIGHT/LEFT HEART CATH AND CORONARY/GRAFT ANGIOGRAPHY N/A 11/24/2018   Procedure: RIGHT/LEFT HEART CATH AND CORONARY/GRAFT ANGIOGRAPHY;  Surgeon: Leonie Man, MD;  Location: [facility];  Service: Cardiovascular;  Laterality: N/A;  . TEE WITHOUT CARDIOVERSION N/A 03/26/2017   Procedure: TRANSESOPHAGEAL ECHOCARDIOGRAM (TEE);  Surgeon: Prescott Gum, [name], MD;  Location: [facility];  Service: Open Heart Surgery;  Laterality: N/A;    Current Medications: Current Meds  Medication Sig  . acetaminophen (TYLENOL) 325 MG tablet Take 325 mg by mouth every 6 (six) hours as needed for moderate pain or headache.   . albuterol (VENTOLIN HFA) 108 (90 Base) MCG/ACT inhaler Inhale 2 puffs into the lungs every 6 (six) hours as needed for wheezing or shortness of breath.  Marland Kitchen aspirin EC 81 MG tablet Take 81 mg by mouth daily.  Marland Kitchen atorvastatin (LIPITOR) 80 MG tablet Take 1 tablet (80 mg total) by mouth daily at 6 PM.  . busPIRone (BUSPAR) 15 MG tablet Take 15 mg by mouth 3 (three) times daily.   . clopidogrel (PLAVIX) 75 MG tablet Take 1 tablet (75 mg total) by mouth daily with breakfast.  . Ferrous Sulfate (IRON) 325 (65 Fe) MG TABS Take 1 tablet (325 mg total) by mouth 2 (two) times daily.  Vanessa Kick Ethyl 1 g CAPS Take 2  capsules (2 g total) by mouth 2 (two) times daily.  . metoprolol tartrate (LOPRESSOR) 25 MG tablet Take 1 tablet (25 mg total) by mouth 2 (two) times daily.  . Multiple Vitamin (MULTIVITAMIN WITH MINERALS) TABS tablet Take 1 tablet by mouth daily.  . nitroGLYCERIN (NITROSTAT) 0.4 MG SL tablet Place 0.4 mg under the tongue every 5 (five) minutes as needed for chest pain.  . polyethylene glycol powder (MIRALAX) 17 GM/SCOOP powder Take 255 g by mouth daily as needed.  . ranolazine (RANEXA) 500 MG 12 hr tablet Take 1 tablet (500 mg total) by mouth 2 (two) times daily.  . traZODone (DESYREL) 50 MG tablet Take 75 mg by mouth at bedtime.      Allergies:   Amiodarone, Colchicine, Compazine [prochlorperazine edisylate], and Other   Social History   Socioeconomic History  . Marital status: Divorced    Spouse name: Not on file  . Number of children: Not on file  . Years of education: Not on file  .  Highest education level: Not on file  Occupational History  . Not on file  Tobacco Use  . Smoking status: Never Smoker  . Smokeless tobacco: Never Used  Substance and Sexual Activity  . Alcohol use: Yes    Comment: 11/05/2016 "maybe 1 glass of wine/month"  . Drug use: No  . Sexual activity: Not Currently  Other Topics Concern  . Not on file  Social History Narrative  . Not on file   Social Determinants of Health   Financial Resource Strain:   . Difficulty of Paying Living Expenses: Not on file  Food Insecurity:   . Worried About Charity fundraiser in the Last Year: Not on file  . Ran Out of Food in the Last Year: Not on file  Transportation Needs:   . Lack of Transportation (Medical): Not on file  . Lack of Transportation (Non-Medical): Not on file  Physical Activity:   . Days of Exercise per Week: Not on file  . Minutes of Exercise per Session: Not on file  Stress:   . Feeling of Stress : Not on file  Social Connections:   . Frequency of Communication with Friends and Family: Not on  file  . Frequency of Social Gatherings with Friends and Family: Not on file  . Attends Religious Services: Not on file  . Active Member of Clubs or Organizations: Not on file  . Attends Archivist Meetings: Not on file  . Marital Status: Not on file     Family History: The patient's family history includes Alcohol abuse in her father; Diabetes Mellitus II in her father; Heart attack in her father; Hypertension in her father; Suicidality in her mother. ROS:   Please see the history of present illness.    All other systems reviewed and are negative.  EKGs/Labs/Other Studies Reviewed:    The following studies were reviewed today:    Recent Labs: 11/13/2018: NT-Pro BNP 305 11/27/2018: BUN 7; Creatinine, Ser 0.60; Potassium 3.9; Sodium 138 12/24/2018: Hemoglobin 9.6; Platelets 275  Recent Lipid Panel    Component Value Date/Time   CHOL 145 11/26/2018 0452   TRIG 225 (H) 11/26/2018 0452   HDL 30 (L) 11/26/2018 0452   CHOLHDL 4.8 11/26/2018 0452   VLDL 45 (H) 11/26/2018 0452   LDLCALC 70 11/26/2018 0452    Physical Exam:    VS:  BP 120/74 (BP Location: Right Arm, Patient Position: Sitting, Cuff Size: Normal)   Pulse 63   Ht 5\' 3"  (1.6 m)   Wt 117 lb (53.1 kg)   SpO2 99%   BMI 20.73 kg/m     Wt Readings from Last 3 Encounters:  01/29/19 117 lb (53.1 kg)  12/24/18 124 lb 1.9 oz (56.3 kg)  12/03/18 116 lb (52.6 kg)     GEN: Appears less pale well nourished, well developed in no acute distress HEENT: Normal NECK: No JVD; No carotid bruits LYMPHATICS: No lymphadenopathy CARDIAC: RRR, no murmurs, rubs, gallops RESPIRATORY:  Clear to auscultation without rales, wheezing or rhonchi  ABDOMEN: Soft, non-tender, non-distended MUSCULOSKELETAL:  No edema; No deformity  SKIN: Warm and dry NEUROLOGIC:  Alert and oriented x 3 PSYCHIATRIC:  Normal affect    Signed, Shirlee More, MD  01/29/2019 10:11 AM    [place]

## 2019-01-29 ENCOUNTER — Other Ambulatory Visit: Payer: Self-pay

## 2019-01-29 ENCOUNTER — Ambulatory Visit (INDEPENDENT_AMBULATORY_CARE_PROVIDER_SITE_OTHER): Payer: Self-pay | Admitting: Cardiology

## 2019-01-29 ENCOUNTER — Encounter: Payer: Self-pay | Admitting: Cardiology

## 2019-01-29 VITALS — BP 120/74 | HR 63 | Ht 63.0 in | Wt 117.0 lb

## 2019-01-29 DIAGNOSIS — I25708 Atherosclerosis of coronary artery bypass graft(s), unspecified, with other forms of angina pectoris: Secondary | ICD-10-CM

## 2019-01-29 DIAGNOSIS — D649 Anemia, unspecified: Secondary | ICD-10-CM

## 2019-01-29 DIAGNOSIS — E785 Hyperlipidemia, unspecified: Secondary | ICD-10-CM

## 2019-01-29 NOTE — Patient Instructions (Signed)
Medication Instructions:  Your physician recommends that you continue on your current medications as directed. Please refer to the Current Medication list given to you today.  *If you need a refill on your cardiac medications before your next appointment, please call your pharmacy*  Lab Work: Your physician recommends that you return for lab work today: CBC.   If you have labs (blood work) drawn today and your tests are completely normal, you will receive your results only by: Marland Kitchen MyChart Message (if you have MyChart) OR . A paper copy in the mail If you have any lab test that is abnormal or we need to change your treatment, we will call you to review the results.  Testing/Procedures: None  Follow-Up: At [facility], you and your health needs are our priority.  As part of our continuing mission to provide you with exceptional heart care, we have created designated Provider Care Teams.  These Care Teams include your primary Cardiologist (physician) and Advanced Practice Providers (APPs -  Physician Assistants and Nurse Practitioners) who all work together to provide you with the care you need, when you need it.  Your next appointment:   6 week(s)  The format for your next appointment:   In Person  Provider:   Shirlee More, MD

## 2019-01-30 LAB — CBC
Hematocrit: 34.3 % (ref 34.0–46.6)
Hemoglobin: 11.6 g/dL (ref 11.1–15.9)
MCH: 31.6 pg (ref 26.6–33.0)
MCHC: 33.8 g/dL (ref 31.5–35.7)
MCV: 94 fL (ref 79–97)
Platelets: 368 10*3/uL (ref 150–450)
RBC: 3.67 x10E6/uL — ABNORMAL LOW (ref 3.77–5.28)
RDW: 12.3 % (ref 11.7–15.4)
WBC: 8.1 10*3/uL (ref 3.4–10.8)

## 2019-02-13 HISTORY — PX: ANKLE FRACTURE SURGERY: SHX122

## 2019-02-18 MED FILL — RANOLAZINE ER 500 MG TABLET: 500 | 30 days supply | Qty: 60 | Fill #2

## 2019-02-27 MED FILL — PANTOPRAZOLE SOD DR 40 MG T: 40 | 10 days supply | Qty: 20 | Fill #0

## 2019-03-04 ENCOUNTER — Inpatient Hospital Stay (HOSPITAL_COMMUNITY)
Admission: EM | Admit: 2019-03-04 | Discharge: 2019-03-07 | DRG: 287 | Disposition: A | Discharge status: Home / Self Care | Payer: Medicaid Other | Attending: Internal Medicine | Admitting: Internal Medicine

## 2019-03-04 ENCOUNTER — Emergency Department (HOSPITAL_COMMUNITY): Payer: Medicaid Other

## 2019-03-04 ENCOUNTER — Other Ambulatory Visit: Payer: Self-pay

## 2019-03-04 ENCOUNTER — Encounter (HOSPITAL_COMMUNITY): Payer: Self-pay | Admitting: Emergency Medicine

## 2019-03-04 DIAGNOSIS — Z20822 Contact with and (suspected) exposure to covid-19: Secondary | ICD-10-CM | POA: Diagnosis present

## 2019-03-04 DIAGNOSIS — D509 Iron deficiency anemia, unspecified: Secondary | ICD-10-CM

## 2019-03-04 DIAGNOSIS — Z833 Family history of diabetes mellitus: Secondary | ICD-10-CM

## 2019-03-04 DIAGNOSIS — Z811 Family history of alcohol abuse and dependence: Secondary | ICD-10-CM

## 2019-03-04 DIAGNOSIS — I252 Old myocardial infarction: Secondary | ICD-10-CM

## 2019-03-04 DIAGNOSIS — I25810 Atherosclerosis of coronary artery bypass graft(s) without angina pectoris: Secondary | ICD-10-CM | POA: Diagnosis present

## 2019-03-04 DIAGNOSIS — E876 Hypokalemia: Secondary | ICD-10-CM | POA: Diagnosis present

## 2019-03-04 DIAGNOSIS — Z7982 Long term (current) use of aspirin: Secondary | ICD-10-CM

## 2019-03-04 DIAGNOSIS — I447 Left bundle-branch block, unspecified: Secondary | ICD-10-CM | POA: Diagnosis present

## 2019-03-04 DIAGNOSIS — Z888 Allergy status to other drugs, medicaments and biological substances status: Secondary | ICD-10-CM

## 2019-03-04 DIAGNOSIS — I25110 Atherosclerotic heart disease of native coronary artery with unstable angina pectoris: Principal | ICD-10-CM | POA: Diagnosis present

## 2019-03-04 DIAGNOSIS — I951 Orthostatic hypotension: Secondary | ICD-10-CM | POA: Diagnosis present

## 2019-03-04 DIAGNOSIS — Z7902 Long term (current) use of antithrombotics/antiplatelets: Secondary | ICD-10-CM

## 2019-03-04 DIAGNOSIS — K219 Gastro-esophageal reflux disease without esophagitis: Secondary | ICD-10-CM | POA: Diagnosis present

## 2019-03-04 DIAGNOSIS — I200 Unstable angina: Secondary | ICD-10-CM

## 2019-03-04 DIAGNOSIS — Z955 Presence of coronary angioplasty implant and graft: Secondary | ICD-10-CM

## 2019-03-04 DIAGNOSIS — Z8249 Family history of ischemic heart disease and other diseases of the circulatory system: Secondary | ICD-10-CM

## 2019-03-04 DIAGNOSIS — I2582 Chronic total occlusion of coronary artery: Secondary | ICD-10-CM | POA: Diagnosis present

## 2019-03-04 DIAGNOSIS — E785 Hyperlipidemia, unspecified: Secondary | ICD-10-CM | POA: Diagnosis present

## 2019-03-04 DIAGNOSIS — I5030 Unspecified diastolic (congestive) heart failure: Secondary | ICD-10-CM | POA: Diagnosis present

## 2019-03-04 HISTORY — DX: Unstable angina: I20.0

## 2019-03-04 HISTORY — DX: Atherosclerotic heart disease of native coronary artery without angina pectoris: I25.10

## 2019-03-04 LAB — T4, FREE: Free T4: 0.98 ng/dL (ref 0.61–1.12)

## 2019-03-04 LAB — CBC WITH DIFFERENTIAL/PLATELET
Abs Immature Granulocytes: 0.02 10*3/uL (ref 0.00–0.07)
Basophils Absolute: 0 10*3/uL (ref 0.0–0.1)
Basophils Relative: 0 %
Eosinophils Absolute: 0 10*3/uL (ref 0.0–0.5)
Eosinophils Relative: 1 %
HCT: 33.4 % — ABNORMAL LOW (ref 36.0–46.0)
Hemoglobin: 10.9 g/dL — ABNORMAL LOW (ref 12.0–15.0)
Immature Granulocytes: 0 %
Lymphocytes Relative: 21 %
Lymphs Abs: 1.1 10*3/uL (ref 0.7–4.0)
MCH: 31.4 pg (ref 26.0–34.0)
MCHC: 32.6 g/dL (ref 30.0–36.0)
MCV: 96.3 fL (ref 80.0–100.0)
Monocytes Absolute: 0.3 10*3/uL (ref 0.1–1.0)
Monocytes Relative: 6 %
Neutro Abs: 3.8 10*3/uL (ref 1.7–7.7)
Neutrophils Relative %: 72 %
Platelets: 360 10*3/uL (ref 150–400)
RBC: 3.47 MIL/uL — ABNORMAL LOW (ref 3.87–5.11)
RDW: 13.8 % (ref 11.5–15.5)
WBC: 5.3 10*3/uL (ref 4.0–10.5)
nRBC: 0 % (ref 0.0–0.2)

## 2019-03-04 LAB — CBC
HCT: 31 % — ABNORMAL LOW (ref 36.0–46.0)
Hemoglobin: 10.3 g/dL — ABNORMAL LOW (ref 12.0–15.0)
MCH: 31.7 pg (ref 26.0–34.0)
MCHC: 33.2 g/dL (ref 30.0–36.0)
MCV: 95.4 fL (ref 80.0–100.0)
Platelets: 321 10*3/uL (ref 150–400)
RBC: 3.25 MIL/uL — ABNORMAL LOW (ref 3.87–5.11)
RDW: 13.9 % (ref 11.5–15.5)
WBC: 6.5 10*3/uL (ref 4.0–10.5)
nRBC: 0 % (ref 0.0–0.2)

## 2019-03-04 LAB — BASIC METABOLIC PANEL
Anion gap: 9 (ref 5–15)
BUN: 23 mg/dL — ABNORMAL HIGH (ref 6–20)
CO2: 21 mmol/L — ABNORMAL LOW (ref 22–32)
Calcium: 9.2 mg/dL (ref 8.9–10.3)
Chloride: 108 mmol/L (ref 98–111)
Creatinine, Ser: 1.21 mg/dL — ABNORMAL HIGH (ref 0.44–1.00)
GFR calc Af Amer: 58 mL/min — ABNORMAL LOW (ref >60)
GFR calc non Af Amer: 50 mL/min — ABNORMAL LOW (ref >60)
Glucose, Bld: 90 mg/dL (ref 70–99)
Potassium: 2.7 mmol/L — CL (ref 3.5–5.1)
Sodium: 138 mmol/L (ref 135–145)

## 2019-03-04 LAB — RESPIRATORY PANEL BY RT PCR (FLU A&B, COVID)
Influenza A by PCR: NEGATIVE
Influenza B by PCR: NEGATIVE
SARS Coronavirus 2 by RT PCR: NEGATIVE

## 2019-03-04 LAB — TSH: TSH: 1.806 u[IU]/mL (ref 0.350–4.500)

## 2019-03-04 LAB — FERRITIN: Ferritin: 24 ng/mL (ref 11–307)

## 2019-03-04 LAB — RETICULOCYTES
Immature Retic Fract: 19.6 % — ABNORMAL HIGH (ref 2.3–15.9)
RBC.: 3.51 MIL/uL — ABNORMAL LOW (ref 3.87–5.11)
Retic Count, Absolute: 55.5 10*3/uL (ref 19.0–186.0)
Retic Ct Pct: 1.6 % (ref 0.4–3.1)

## 2019-03-04 LAB — HEMOGLOBIN A1C
Hgb A1c MFr Bld: 5.4 % (ref 4.8–5.6)
Mean Plasma Glucose: 108.28 mg/dL

## 2019-03-04 LAB — MAGNESIUM: Magnesium: 1.4 mg/dL — ABNORMAL LOW (ref 1.7–2.4)

## 2019-03-04 LAB — FOLATE: Folate: 66.5 ng/mL (ref >5.9)

## 2019-03-04 LAB — IRON AND TIBC
Iron: 87 ug/dL (ref 28–170)
Saturation Ratios: 18 % (ref 10.4–31.8)
TIBC: 479 ug/dL — ABNORMAL HIGH (ref 250–450)
UIBC: 392 ug/dL

## 2019-03-04 LAB — TROPONIN I (HIGH SENSITIVITY)
Troponin I (High Sensitivity): 17 ng/L (ref <18)
Troponin I (High Sensitivity): 16 ng/L (ref <18)
Troponin I (High Sensitivity): 18 ng/L — ABNORMAL HIGH (ref <18)

## 2019-03-04 LAB — HEPARIN LEVEL (UNFRACTIONATED): Heparin Unfractionated: 0.41 IU/mL (ref 0.30–0.70)

## 2019-03-04 LAB — HIV ANTIBODY (ROUTINE TESTING W REFLEX): HIV Screen 4th Generation wRfx: NONREACTIVE

## 2019-03-04 LAB — VITAMIN B12: Vitamin B-12: 792 pg/mL (ref 180–914)

## 2019-03-04 MED ORDER — SERTRALINE HCL 50 MG PO TABS
25.0000 mg | ORAL_TABLET | Freq: Every day | ORAL | Status: DC
  Administered 2019-03-04 – 2019-03-07 (×4): 25 mg via ORAL
  Filled 2019-03-04 (×6): qty 1

## 2019-03-04 MED ORDER — FENTANYL CITRATE (PF) 100 MCG/2ML IJ SOLN
50.0000 ug | Freq: Once | INTRAMUSCULAR | Status: AC
  Administered 2019-03-04: 50 ug via INTRAVENOUS
  Filled 2019-03-04: qty 2

## 2019-03-04 MED ORDER — ALPRAZOLAM 0.25 MG PO TABS
0.2500 mg | ORAL_TABLET | Freq: Two times a day (BID) | ORAL | Status: DC | PRN
  Administered 2019-03-05 – 2019-03-06 (×3): 0.25 mg via ORAL
  Filled 2019-03-04 (×3): qty 1

## 2019-03-04 MED ORDER — SODIUM CHLORIDE 0.9 % IV SOLN: INTRAVENOUS | Status: DC

## 2019-03-04 MED ORDER — ACETAMINOPHEN 325 MG PO TABS: 650.0000 mg | ORAL_TABLET | ORAL | Status: DC | PRN

## 2019-03-04 MED ORDER — ASPIRIN EC 81 MG PO TBEC
81.0000 mg | DELAYED_RELEASE_TABLET | Freq: Every day | ORAL | Status: DC
  Administered 2019-03-06 – 2019-03-07 (×2): 81 mg via ORAL
  Filled 2019-03-04 (×3): qty 1

## 2019-03-04 MED ORDER — METOPROLOL TARTRATE 25 MG PO TABS
25.0000 mg | ORAL_TABLET | Freq: Two times a day (BID) | ORAL | Status: DC
  Administered 2019-03-04 – 2019-03-05 (×2): 25 mg via ORAL
  Filled 2019-03-04 (×2): qty 1

## 2019-03-04 MED ORDER — ONDANSETRON HCL 4 MG/2ML IJ SOLN: 4.0000 mg | Freq: Four times a day (QID) | INTRAMUSCULAR | Status: DC | PRN

## 2019-03-04 MED ORDER — SODIUM CHLORIDE 0.9% FLUSH: 3.0000 mL | INTRAVENOUS | Status: DC | PRN

## 2019-03-04 MED ORDER — ASPIRIN 81 MG PO CHEW
81.0000 mg | CHEWABLE_TABLET | ORAL | Status: AC
  Administered 2019-03-05: 81 mg via ORAL
  Filled 2019-03-04: qty 1

## 2019-03-04 MED ORDER — SODIUM CHLORIDE 0.9 % WEIGHT BASED INFUSION
1.0000 mL/kg/h | INTRAVENOUS | Status: DC
  Administered 2019-03-05 (×2): 1 mL/kg/h via INTRAVENOUS

## 2019-03-04 MED ORDER — PANTOPRAZOLE SODIUM 40 MG PO TBEC
40.0000 mg | DELAYED_RELEASE_TABLET | Freq: Two times a day (BID) | ORAL | Status: DC
  Administered 2019-03-04 – 2019-03-07 (×6): 40 mg via ORAL
  Filled 2019-03-04 (×7): qty 1

## 2019-03-04 MED ORDER — TRAZODONE HCL 150 MG PO TABS
300.0000 mg | ORAL_TABLET | Freq: Every day | ORAL | Status: DC
  Administered 2019-03-04 – 2019-03-06 (×3): 300 mg via ORAL
  Filled 2019-03-04: qty 2
  Filled 2019-03-04: qty 6
  Filled 2019-03-04: qty 2

## 2019-03-04 MED ORDER — BUSPIRONE HCL 5 MG PO TABS
15.0000 mg | ORAL_TABLET | Freq: Three times a day (TID) | ORAL | Status: DC
  Administered 2019-03-04 – 2019-03-07 (×9): 15 mg via ORAL
  Filled 2019-03-04: qty 1
  Filled 2019-03-04 (×2): qty 2
  Filled 2019-03-04 (×4): qty 1
  Filled 2019-03-04: qty 2
  Filled 2019-03-04: qty 1

## 2019-03-04 MED ORDER — RANOLAZINE ER 500 MG PO TB12
500.0000 mg | ORAL_TABLET | Freq: Two times a day (BID) | ORAL | Status: DC
  Administered 2019-03-04 – 2019-03-05 (×2): 500 mg via ORAL
  Filled 2019-03-04 (×2): qty 1

## 2019-03-04 MED ORDER — SODIUM CHLORIDE 0.9 % IV SOLN
250.0000 mL | INTRAVENOUS | Status: DC | PRN
  Administered 2019-03-05 (×2): 250 mL via INTRAVENOUS

## 2019-03-04 MED ORDER — NITROGLYCERIN 0.4 MG SL SUBL: 0.4000 mg | SUBLINGUAL_TABLET | SUBLINGUAL | Status: DC | PRN

## 2019-03-04 MED ORDER — SODIUM CHLORIDE 0.9% FLUSH
3.0000 mL | Freq: Two times a day (BID) | INTRAVENOUS | Status: DC
  Administered 2019-03-04 – 2019-03-06 (×2): 3 mL via INTRAVENOUS

## 2019-03-04 MED ORDER — ACETAMINOPHEN 325 MG PO TABS: 325.0000 mg | ORAL_TABLET | Freq: Four times a day (QID) | ORAL | Status: DC | PRN

## 2019-03-04 MED ORDER — SODIUM CHLORIDE 0.9 % IV BOLUS
500.0000 mL | Freq: Once | INTRAVENOUS | Status: AC
  Administered 2019-03-04: 500 mL via INTRAVENOUS

## 2019-03-04 MED ORDER — CLOPIDOGREL BISULFATE 75 MG PO TABS
75.0000 mg | ORAL_TABLET | Freq: Every day | ORAL | Status: DC
  Administered 2019-03-04 – 2019-03-07 (×4): 75 mg via ORAL
  Filled 2019-03-04 (×4): qty 1

## 2019-03-04 MED ORDER — ACETAMINOPHEN 325 MG PO TABS
650.0000 mg | ORAL_TABLET | Freq: Once | ORAL | Status: AC
  Administered 2019-03-04: 650 mg via ORAL
  Filled 2019-03-04: qty 2

## 2019-03-04 MED ORDER — NITROGLYCERIN 0.4 MG SL SUBL
0.4000 mg | SUBLINGUAL_TABLET | SUBLINGUAL | Status: DC | PRN
  Filled 2019-03-04: qty 1

## 2019-03-04 MED ORDER — MAGNESIUM SULFATE 2 GM/50ML IV SOLN
2.0000 g | Freq: Once | INTRAVENOUS | Status: AC
  Administered 2019-03-04: 2 g via INTRAVENOUS
  Filled 2019-03-04: qty 50

## 2019-03-04 MED ORDER — HEPARIN (PORCINE) 25000 UT/250ML-% IV SOLN
700.0000 [IU]/h | INTRAVENOUS | Status: DC
  Administered 2019-03-04: 700 [IU]/h via INTRAVENOUS
  Filled 2019-03-04: qty 250

## 2019-03-04 MED ORDER — ALBUTEROL SULFATE HFA 108 (90 BASE) MCG/ACT IN AERS: 2.0000 | INHALATION_SPRAY | Freq: Four times a day (QID) | RESPIRATORY_TRACT | Status: DC | PRN

## 2019-03-04 MED ORDER — HEPARIN BOLUS VIA INFUSION
3000.0000 [IU] | Freq: Once | INTRAVENOUS | Status: AC
  Administered 2019-03-04: 3000 [IU] via INTRAVENOUS
  Filled 2019-03-04: qty 3000

## 2019-03-04 MED ORDER — ATORVASTATIN CALCIUM 80 MG PO TABS
80.0000 mg | ORAL_TABLET | Freq: Every day | ORAL | Status: DC
  Administered 2019-03-04 – 2019-03-06 (×3): 80 mg via ORAL
  Filled 2019-03-04 (×3): qty 1

## 2019-03-04 MED ORDER — FENTANYL CITRATE (PF) 100 MCG/2ML IJ SOLN
50.0000 ug | INTRAMUSCULAR | Status: DC | PRN
  Administered 2019-03-04 – 2019-03-05 (×6): 50 ug via INTRAVENOUS
  Filled 2019-03-04 (×6): qty 2

## 2019-03-04 MED ORDER — POTASSIUM CHLORIDE 10 MEQ/100ML IV SOLN
10.0000 meq | INTRAVENOUS | Status: AC
  Administered 2019-03-04 (×2): 10 meq via INTRAVENOUS
  Filled 2019-03-04 (×2): qty 100

## 2019-03-04 MED ORDER — SODIUM CHLORIDE 0.9 % WEIGHT BASED INFUSION
3.0000 mL/kg/h | INTRAVENOUS | Status: DC
  Administered 2019-03-05: 3 mL/kg/h via INTRAVENOUS

## 2019-03-04 NOTE — Progress Notes (Signed)
ANTICOAGULATION CONSULT NOTE - Follow Up Consult  Pharmacy Consult for IV Heparin Indication: chest pain/ACS  Allergies  Allergen Reactions  . Amiodarone Diarrhea and Nausea And Vomiting    In combination with colchicine  . Colchicine Diarrhea and Nausea And Vomiting    Pt states she had rnx to medication; severe V/D  . Compazine [Prochlorperazine Edisylate] Other (See Comments)    SEIZURE  . Other Other (See Comments)    Steroids cause uncontrollable coughing    Patient Measurements: Weight: 117 lb (53.1 kg) Heparin Dosing Weight: 53.1 kg  Vital Signs: Temp: 98.3 F (36.8 C) (01/20 1943) Temp Source: Oral (01/20 1943) BP: 90/56 (01/20 1943) Pulse Rate: 68 (01/20 1943)  Labs: Recent Labs    03/04/19 1002 03/04/19 1300 03/04/19 1752 03/04/19 2039  HGB 10.9*  --   --  10.3*  HCT 33.4*  --   --  31.0*  PLT 360  --   --  321  HEPARINUNFRC  --   --   --  0.41  CREATININE 1.21*  --   --   --   TROPONINIHS 17 16 18*  --     Estimated Creatinine Clearance: 42.9 mL/min (A) (by C-G formula based on SCr of 1.21 mg/dL (H)).   Medications:  Infusions:  . sodium chloride    . sodium chloride    . [START ON 03/05/2019] sodium chloride     Followed by  . [START ON 03/05/2019] sodium chloride    . heparin 700 Units/hr (03/04/19 1730)    Assessment: 59 year old female with hx of CABG in 2019 and a myocardial infarction in October 2020. She is admitted today with chest pain with radiation to arms. She took aspirin 325 mg this morning. She is on plavix prior to admission. She has a bump in her SCr and is hypokalemic. H/h plts ok.  Initial heparin level therapeutic but drawn early at 3 hrs and may represent bolus effect.  Continue for now and follow AM level.    Goal of Therapy:  Heparin level 0.3-0.7 units/ml Monitor platelets by anticoagulation protocol: Yes   Plan:  Continue Heparin at 700 units/hr for now F/up AM level   Sloan Leiter, PharmD, BCPS,  BCCCP Clinical Pharmacist Please refer to [facility] for [place] numbers 03/04/2019,9:34 PM

## 2019-03-04 NOTE — H&P (Signed)
Cardiology Admission History and Physical:   Patient ID: Gabrielle Page MRN: [id number]; DOB: Jul 21, 1960   Admission date: 03/04/2019  Primary Care Provider: Jaclynn Major, NP Primary Cardiologist: Shirlee More, MD  Primary Electrophysiologist:  None   Chief Complaint:  Chest pain  Patient Profile:   Gabrielle Page is a 59 y.o. female with hx of CAD, s/p CABG 2019 with LIMA to LAD, SVG to Diag and SVG to [facility] post operative PAF and pericardial effusion. 11/2018 with acute inferior lateral MI occluded OM unable to open, and occluded VGs, iron def anemia now admitted with chest pain.    History of Present Illness:   Gabrielle Page with above hx and anemia, HLD, CABG 2019 with with LIMA to LAD, SVG to Diag and SVG to [facility] post operative PAF and pericardial effusion.  In Oct 2020 pt had increased SOB at office visit and pro BNP was elevated so pt had cardiac cath.  Occluded native OM1 at anastomosis site, and unsuccessful PTCA.  Also occluded VG-OM and VG to diag.  Patent but small LIMA to LAD.  Last saw Dr. Bettina Gavia 01/29/19 and he notes she has non anginal chest pain relieved with NTG.     Now presents with chest pain that woke her from sleep at 0500 this AM, she took 325 mg ASA and EMS gave 1 NTG with moderate relief.  Sharp, burning, with radiation to lt shoulder and down her lt arm.  No associated symptoms of nausea , diaphoresis, SOB.   EKG:  The ECG that was done 03/04/19 was personally reviewed and demonstrates poor quality, SR with LBBB.  Troponin 17; 16  Na 138, K+ 2.7 BUN 23 Cr 1.21 Hgb 10.9 plts 360 WBC 5.3 (hgb in Dec 2020 was 11.6)   2V CXR with clear lungs, heart upper normal in size, s/p CABG, no evident adenopathy.   She is having significant pain with radiation down both arms.  BP low so no more NTG, fentanyl seems to be helping. It works for a few hours and then pain returns.  She has chest pain every day but this is worst pain she has had.  Pain does  increase with deep breath.  She takes her meds "like clock work"   K+ is infusing and she has had Mg+.   Denies any bleeding.       Heart Pathway Score:     Past Medical History:  Diagnosis Date  . Asthma   . Chronic bronchitis ([place])    "usually get it q yr" (11/05/2016)  . GERD (gastroesophageal reflux disease)   . Headache    "weekly" (11/05/2016)  . Heart murmur    "dx'd when I was a child"  . Hepatitis 1991   "when I had my gallbladder out" (11/05/2016)  . History of blood transfusion 1985   S/P childbirth  . History of kidney stones   . HLD (hyperlipidemia) 11/27/2018  . NSTEMI (non-ST elevated myocardial infarction) ([place]) 11/05/2016   9/18 PCI/DESx1 to p/mLAD  . Pancreatitis 1991  . Pneumonia    "twice" (11/05/2016)    Past Surgical History:  Procedure Laterality Date  . ABDOMINAL HYSTERECTOMY  1990  . APPENDECTOMY  1992  . BILE DUCT EXPLORATION  1991   "reconstruction"  . CESAREAN SECTION  1985  . CHOLECYSTECTOMY OPEN  1991  . CORONARY ANGIOPLASTY WITH STENT PLACEMENT  11/05/2016  . CORONARY ARTERY BYPASS GRAFT N/A 03/26/2017   Procedure: CORONARY ARTERY BYPASS GRAFTING (CABG) x 3 WITH ENDOSCOPIC HARVESTING OF  RIGHT SAPHENOUS VEIN;  Surgeon: Ivin Poot, MD;  Location: [place];  Service: Open Heart Surgery;  Laterality: N/A;  . CORONARY STENT INTERVENTION N/A 11/05/2016   Procedure: CORONARY STENT INTERVENTION;  Surgeon: Martinique, Peter M, MD;  Location: [place] [facility];  Service: Cardiovascular;  Laterality: N/A;  . CYSTOSCOPY WITH STENT PLACEMENT  "3 times" (11/05/2016)  . DILATION AND CURETTAGE OF UTERUS  1989  . INTRAVASCULAR ULTRASOUND/IVUS N/A 03/22/2017   Procedure: Intravascular Ultrasound/IVUS;  Surgeon: Martinique, Peter M, MD;  Location: [place] [facility];  Service: Cardiovascular;  Laterality: N/A;  . KNEE ARTHROSCOPY Right 2000s  . LEFT HEART CATH AND CORONARY ANGIOGRAPHY N/A 11/05/2016   Procedure: LEFT HEART CATH AND CORONARY ANGIOGRAPHY;  Surgeon: Martinique,  Peter M, MD;  Location: [place] [facility];  Service: Cardiovascular;  Laterality: N/A;  . LEFT HEART CATH AND CORONARY ANGIOGRAPHY N/A 03/22/2017   Procedure: LEFT HEART CATH AND CORONARY ANGIOGRAPHY;  Surgeon: Martinique, Peter M, MD;  Location: [place] [facility];  Service: Cardiovascular;  Laterality: N/A;  . NASAL SINUS SURGERY  ~ 2009   "exposed to mildew; had to have the infectoin drained; cut the outside of my face"  . OVARIAN CYST SURGERY  X 6  . RIGHT/LEFT HEART CATH AND CORONARY/GRAFT ANGIOGRAPHY N/A 11/24/2018   Procedure: RIGHT/LEFT HEART CATH AND CORONARY/GRAFT ANGIOGRAPHY;  Surgeon: Leonie Man, MD;  Location: [place] [facility];  Service: Cardiovascular;  Laterality: N/A;  . TEE WITHOUT CARDIOVERSION N/A 03/26/2017   Procedure: TRANSESOPHAGEAL ECHOCARDIOGRAM (TEE);  Surgeon: Prescott Gum, [name], MD;  Location: [place];  Service: Open Heart Surgery;  Laterality: N/A;     Medications Prior to Admission: Prior to Admission medications   Medication Sig Start Date End Date Taking? Authorizing Provider  acetaminophen (TYLENOL) 325 MG tablet Take 325 mg by mouth every 6 (six) hours as needed for moderate pain or headache.    Yes [provider]  albuterol (VENTOLIN HFA) 108 (90 Base) MCG/ACT inhaler Inhale 2 puffs into the lungs every 6 (six) hours as needed for wheezing or shortness of breath.   Yes [provider]  aspirin EC 81 MG tablet Take 81 mg by mouth daily.   Yes [provider]  atorvastatin (LIPITOR) 80 MG tablet Take 1 tablet (80 mg total) by mouth daily at 6 PM. 11/27/18  Yes Isaiah Serge, NP  busPIRone (BUSPAR) 15 MG tablet Take 15 mg by mouth 3 (three) times daily.    Yes [provider]  clopidogrel (PLAVIX) 75 MG tablet Take 1 tablet (75 mg total) by mouth daily with breakfast. 11/28/18  Yes Isaiah Serge, NP  furosemide (LASIX) 40 MG tablet Take 40 mg by mouth daily. 02/17/19  Yes [provider]  metoprolol tartrate  (LOPRESSOR) 25 MG tablet Take 1 tablet (25 mg total) by mouth 2 (two) times daily. 06/19/17  Yes Ivin Poot, MD  Multiple Vitamin (MULTIVITAMIN WITH MINERALS) TABS tablet Take 1 tablet by mouth daily.   Yes [provider]  nitroGLYCERIN (NITROSTAT) 0.4 MG SL tablet Place 0.4 mg under the tongue every 5 (five) minutes as needed for chest pain.   Yes [provider]  pantoprazole (PROTONIX) 40 MG tablet Take 40 mg by mouth daily. 02/27/19  Yes [provider]  ranolazine (RANEXA) 500 MG 12 hr tablet Take 1 tablet (500 mg total) by mouth 2 (two) times daily. 12/03/18  Yes Loel Dubonnet, NP  sertraline (ZOLOFT) 25 MG tablet  Take 25 mg by mouth daily. 01/27/19  Yes [provider]  traZODone (DESYREL) 150 MG tablet Take 300 mg by mouth at bedtime.  09/22/18  Yes [provider]  Icosapent Ethyl 1 g CAPS Take 2 capsules (2 g total) by mouth 2 (two) times daily. Patient not taking: Reported on 03/04/2019 11/27/18   Isaiah Serge, NP     Allergies:    Allergies  Allergen Reactions  . Amiodarone Diarrhea and Nausea And Vomiting    In combination with colchicine  . Colchicine Diarrhea and Nausea And Vomiting    Pt states she had rnx to medication; severe V/D  . Compazine [Prochlorperazine Edisylate] Other (See Comments)    SEIZURE  . Other Other (See Comments)    Steroids cause uncontrollable coughing    Social History:   Social History   Socioeconomic History  . Marital status: Divorced    Spouse name: Not on file  . Number of children: Not on file  . Years of education: Not on file  . Highest education level: Not on file  Occupational History  . Not on file  Tobacco Use  . Smoking status: Never Smoker  . Smokeless tobacco: Never Used  Substance and Sexual Activity  . Alcohol use: Yes    Comment: 11/05/2016 "maybe 1 glass of wine/month"  . Drug use: No  . Sexual activity: Not Currently  Other Topics Concern  . Not on file  Social  History Narrative  . Not on file   Social Determinants of Health   Financial Resource Strain:   . Difficulty of Paying Living Expenses: Not on file  Food Insecurity:   . Worried About Charity fundraiser in the Last Year: Not on file  . Ran Out of Food in the Last Year: Not on file  Transportation Needs:   . Lack of Transportation (Medical): Not on file  . Lack of Transportation (Non-Medical): Not on file  Physical Activity:   . Days of Exercise per Week: Not on file  . Minutes of Exercise per Session: Not on file  Stress:   . Feeling of Stress : Not on file  Social Connections:   . Frequency of Communication with Friends and Family: Not on file  . Frequency of Social Gatherings with Friends and Family: Not on file  . Attends Religious Services: Not on file  . Active Member of Clubs or Organizations: Not on file  . Attends Archivist Meetings: Not on file  . Marital Status: Not on file  Intimate Partner Violence:   . Fear of Current or Ex-Partner: Not on file  . Emotionally Abused: Not on file  . Physically Abused: Not on file  . Sexually Abused: Not on file    Family History:   The patient's family history includes Alcohol abuse in her father; Diabetes Mellitus II in her father; Heart attack in her father; Hypertension in her father; Suicidality in her mother.    ROS:  Please see the history of present illness.  General:no colds or fevers, no weight changes Skin:no rashes or ulcers HEENT:no blurred vision, no congestion CV:see HPI PUL:see HPI hx of asthma GI:no diarrhea constipation or melena, no indigestion GU:no hematuria, no dysuria MS:no joint pain, no claudication Neuro:no syncope, no lightheadedness Endo:no diabetes, no thyroid disease  All other ROS reviewed and negative.     Physical Exam/Data:   Vitals:   03/04/19 1245 03/04/19 1300 03/04/19 1315 03/04/19 1402  BP: 109/68 109/64 100/70 Marland Kitchen)  91/57  Pulse: 70 67 69 69  Resp: 14 (!) 9 11 17     Temp:      TempSrc:      SpO2: 99% 98% 97% 97%   No intake or output data in the 24 hours ending 03/04/19 1515 Last 3 Weights 01/29/2019 12/24/2018 12/03/2018  Weight (lbs) 117 lb 124 lb 1.9 oz 116 lb  Weight (kg) 53.071 kg 56.3 kg 52.617 kg     There is no height or weight on file to calculate BMI.  General:  Well nourished, well developed, in with active chest pain and in distress  HEENT: normal Lymph: no adenopathy Neck: + JVD with acute pain Endocrine:  No thryomegaly Vascular: No carotid bruits; FA pulses 2+ bilaterally without bruits  Cardiac:  normal S1, S2; RRR; no murmur gallup rub or click Lungs:  clear to auscultation bilaterally, no wheezing, rhonchi or rales  Abd: soft, nontender, no hepatomegaly  Ext: no lower ext edema Musculoskeletal:  No deformities, BUE and BLE strength normal and equal Skin: warm and dry  Neuro:  Alert and oriented X 3 MAE follows commands, no focal abnormalities noted Psych:  Normal affect     Relevant CV Studies: Cardiac cath 11/24/18  Diagnostic Dominance: Right  Intervention     Dist LM lesion is 40% stenosed.  Ost LAD lesion is 80% stenosed. -As noted by Dr. Martinique and regional cath  Prox LAD to Mid LAD stent across the diagonal is widely patent  Mid LAD-1 lesion is 60% stenosed. Mid LAD-2 lesion is 80% stenosed.  Ost 1st Diag lesion is 60% stenosed.  LIMA graft was visualized by angiography and is small -to a relatively small caliber downstream LAD.  SVG-OM1 graft was visualized by angiography and is normal in caliber. Origin to Prox Graft lesion is 100% stenosed.  SVG-1ST DIAG graft was visualized by angiography and is normal in caliber. Origin to Prox Graft lesion is 100% stenosed.  1st Mrg lesion is 99% stenosed at the graft insertion site  Balloon angioplasty was attempted, lesion could not be crossed. Unfortunately lesion progressed.  Post intervention, there is a 100% residual stenosis. After multiple attempts  to cross, procedure aborted.  ------------------------------------------  LV end diastolic pressure is normal. Right heart pressures normal.  Mildly reduced cardiac output/index   SUMMARY  PeriprocedureType 4 MI -with now occluded occlusion of native OM1 at anastomosis site. Attempted PTCA of 99% lesion, unable to advance wire beyond the lesion likely due to suture stricture. With wire removal there was now acute occlusion of the downstream OM.  Occluded SVG-OM and SVG-diagonal  Patent but very small LIMA-LAD with competitive flow to the distal LAD.  Likely significant ostial LAD lesion but with TIMI-3 flow in the diagonal branch. The LAD itself tapers to a relatively small caliber distally with competitive flow. The stents itself is widely patent  Totally normal right heart cath pressures with no evidence of CHF. Right atrial, RVEDP, LVEDP and PCWP pressures are all normal.  Mildly reduced cardiac output of 2.44 with an index of 2.91.  RECOMMENDATIONS:   Unfortunately it seems like the patient will likely complete an inferolateral infarct with occluded OM.  She will be admitted to the ICU for management.  We will start IV heparin 6 hours post sheath removal.  If blood pressure tolerates, will use IV nitroglycerin, but otherwise will use IV and oral narcotics for pain control.  Cycle troponin levels.  Restart most home medications with exception of Lasix.  I suspect  that she should be in the hospital least 2 to 3 days to for completion of her infarct.  Monitor for signs of hypotension etc.  Echo 11/25/18   IMPRESSIONS    1. Left ventricular ejection fraction, by visual estimation, is 55 to 60%. The left ventricle has normal function. Normal left ventricular size. There is mildly increased left ventricular hypertrophy.  2. Elevated mean left atrial pressure.  3. Left ventricular diastolic Doppler parameters are consistent with impaired relaxation pattern of LV  diastolic filling.  4. Global right ventricle has mildly reduced systolic function.The right ventricular size is normal. No increase in right ventricular wall thickness.  5. Left atrial size was mildly dilated.  6. Right atrial size was normal.  7. The mitral valve is abnormal. Mild to moderate mitral valve regurgitation.  8. The tricuspid valve is abnormal. Tricuspid valve regurgitation mild-moderate.  9. Normal pulmonary artery systolic pressure. RVSP 28 mmHg 10. The inferior vena cava is normal in size with <50% respiratory variability, suggesting right atrial pressure of 8 mmHg. 11. Abnormal septal motion consistent with left bundle branch block.  FINDINGS  Left Ventricle: Left ventricular ejection fraction, by visual estimation, is 55 to 60%. The left ventricle has normal function. There is mildly increased left ventricular hypertrophy. Concentric left ventricular hypertrophy. Normal left ventricular  size. Abnormal (paradoxical) septal motion, consistent with left bundle branch block. Spectral Doppler shows Left ventricular diastolic Doppler parameters are consistent with impaired relaxation pattern of LV diastolic filling. Elevated mean left atrial  pressure.  Right Ventricle: The right ventricular size is normal. No increase in right ventricular wall thickness. Global RV systolic function is has mildly reduced systolic function. The tricuspid regurgitant velocity is 2.22 m/s, and with an assumed right atrial  pressure of 8 mmHg, the estimated right ventricular systolic pressure is normal at 27.7 mmHg.  Left Atrium: Left atrial size was mildly dilated.  Right Atrium: Right atrial size was normal in size  Pericardium: There is no evidence of pericardial effusion.  Mitral Valve: The mitral valve is abnormal. Mild to moderate mitral valve regurgitation.  Tricuspid Valve: The tricuspid valve is abnormal. Tricuspid valve regurgitation mild-moderate by color flow Doppler.  Aortic  Valve: The aortic valve is tricuspid. Aortic valve regurgitation was not visualized by color flow Doppler. Aortic valve mean gradient measures 7.0 mmHg. Aortic valve peak gradient measures 10.9 mmHg. Aortic valve area, by VTI measures 1.57 cm.  Pulmonic Valve: The pulmonic valve was not well visualized. Pulmonic valve regurgitation is not visualized by color flow Doppler.  Aorta: The aortic root is normal in size and structure.  Venous: The inferior vena cava is normal in size with less than 50% respiratory variability, suggesting right atrial pressure of 8 mmHg.  IAS/Shunts: The atrial septum is grossly normal.     LEFT VENTRICLE PLAX 2D LVIDd:         3.80 cm  Diastology LVIDs:         2.50 cm  LV e' lateral:   10.10 cm/s LV PW:         1.10 cm  LV E/e' lateral: 11.7 LV IVS:        1.10 cm  LV e' medial:    5.27 cm/s LVOT diam:     1.70 cm  LV E/e' medial:  22.4 LV SV:         40 ml LV SV Index:   25.29 LVOT Area:     2.27 cm    RIGHT VENTRICLE RV  S prime:     7.62 cm/s TAPSE (M-mode): 1.2 cm  LEFT ATRIUM             Index       RIGHT ATRIUM           Index LA diam:        3.80 cm 2.43 cm/m  RA Area:     13.20 cm LA Vol (A2C):   67.2 ml 42.96 ml/m RA Volume:   29.70 ml  18.99 ml/m LA Vol (A4C):   47.7 ml 30.49 ml/m LA Biplane Vol: 59.1 ml 37.78 ml/m  AORTIC VALVE AV Area (Vmax):    1.71 cm AV Area (Vmean):   1.63 cm AV Area (VTI):     1.57 cm AV Vmax:           165.00 cm/s AV Vmean:          129.000 cm/s AV VTI:            0.371 m AV Peak Grad:      10.9 mmHg AV Mean Grad:      7.0 mmHg LVOT Vmax:         124.00 cm/s LVOT Vmean:        92.900 cm/s LVOT VTI:          0.257 m LVOT/AV VTI ratio: 0.69   AORTA Ao Root diam: 2.60 cm  MITRAL VALVE                         TRICUSPID VALVE MV Area (PHT): 2.80 cm              TR Peak grad:   19.7 mmHg MV PHT:        78.59 msec            TR Vmax:        242.00 cm/s MV Decel Time: 271 msec MV E  velocity: 118.00 cm/s 103 cm/s  SHUNTS MV A velocity: 84.00 cm/s  70.3 cm/s Systemic VTI:  0.26 m MV E/A ratio:  1.40        1.5       Systemic Diam: 1.70 cm   ZIO patch monitor SR intermittent LBBB rate related.  No a fib or flutter.  + freq PACs.    Laboratory Data:  High Sensitivity Troponin:   Recent Labs  Lab 03/04/19 1002 03/04/19 1300  TROPONINIHS 17 16      Chemistry Recent Labs  Lab 03/04/19 1002  NA 138  K 2.7*  CL 108  CO2 21*  GLUCOSE 90  BUN 23*  CREATININE 1.21*  CALCIUM 9.2  GFRNONAA 50*  GFRAA 58*  ANIONGAP 9    No results for input(s): PROT, ALBUMIN, AST, ALT, ALKPHOS, BILITOT in the last 168 hours. Hematology Recent Labs  Lab 03/04/19 1002  WBC 5.3  RBC 3.47*  HGB 10.9*  HCT 33.4*  MCV 96.3  MCH 31.4  MCHC 32.6  RDW 13.8  PLT 360   BNPNo results for input(s): BNP, PROBNP in the last 168 hours.  DDimer No results for input(s): DDIMER in the last 168 hours.   Radiology/Studies:  DG Chest 2 View  Result Date: 03/04/2019 CLINICAL DATA:  Chest pain and shortness of breath EXAM: CHEST - 2 VIEW COMPARISON:  November 13, 2018 FINDINGS: Lungs are clear. Heart is upper normal in size with pulmonary vascularity normal. Patient is status post coronary artery bypass grafting. No evident adenopathy. No  pneumothorax. No bone lesions. IMPRESSION: Lungs clear. Heart upper normal in size. Status post coronary artery bypass grafting. No evident adenopathy. Electronically Signed   By: Lowella Grip III M.D.   On: 03/04/2019 10:33       HEAR Score (for undifferentiated chest pain): 5      Assessment and Plan:   1. Angina unstable, doe increase with a deep breath.  BP too soft for NTG though this did help by EMS.   Pt has chest pain daily and does not take NTG, this is worst episode she has had.  Will add IV heparin. Check stools for heme+, anemia panel.  Recheck CBC in 6 hours.  2. CAD with CABG 2019 and VG occlusion 11/2018 + inf MI 11/2018 unable to  open OM.  3. HLD on statin 4. Iron def anemia on po iron and decrease in Hgb from 01/2019 5. Hypokalemia replacing. With IV K+ and IV mg+  Will hold her lasix.   6. Have asked for rapid Covid screen in case we need to take to cath lab.    Severity of Illness: The appropriate patient status for this patient is INPATIENT. Inpatient status is judged to be reasonable and necessary in order to provide the required intensity of service to ensure the patient's safety. The patient's presenting symptoms, physical exam findings, and initial radiographic and laboratory data in the context of their chronic comorbidities is felt to place them at high risk for further clinical deterioration. Furthermore, it is not anticipated that the patient will be medically stable for discharge from the hospital within 2 midnights of admission. The following factors support the patient status of inpatient.   " The patient's presenting symptoms include severe chest pain. " The worrisome physical exam findings include severe pain . " The initial radiographic and laboratory data are worrisome because of stable. " The chronic co-morbidities include significant CAD with SVG occlusion and native disease.  .   * I certify that at the point of admission it is my clinical judgment that the patient will require inpatient hospital care spanning beyond 2 midnights from the point of admission due to high intensity of service, high risk for further deterioration and high frequency of surveillance required.*    For questions or updates, please contact [place] Please consult [email or web address] for contact info under        Signed, Cecilie Kicks, NP  03/04/2019 3:15 PM

## 2019-03-04 NOTE — ED Triage Notes (Signed)
Pt here from home with chest pain that started at 530 , pt had 1 nitro and 324mg  asa , history of stent , cabg  And stable angina

## 2019-03-04 NOTE — Progress Notes (Signed)
ANTICOAGULATION CONSULT NOTE - Initial Consult  Pharmacy Consult for heparin Indication: chest pain/ACS  Patient Measurements: Heparin Dosing Weight: 53.1 kg  Vital Signs: Temp: 98.6 F (37 C) (01/20 0951) Temp Source: Oral (01/20 0951) BP: 91/57 (01/20 1402) Pulse Rate: 69 (01/20 1402)  Labs: Recent Labs    03/04/19 1002 03/04/19 1300  HGB 10.9*  --   HCT 33.4*  --   PLT 360  --   CREATININE 1.21*  --   TROPONINIHS 17 16      Medical History: Past Medical History:  Diagnosis Date  . Asthma   . Chronic bronchitis ([place])    "usually get it q yr" (11/05/2016)  . GERD (gastroesophageal reflux disease)   . Headache    "weekly" (11/05/2016)  . Heart murmur    "dx'd when I was a child"  . Hepatitis 1991   "when I had my gallbladder out" (11/05/2016)  . History of blood transfusion 1985   S/P childbirth  . History of kidney stones   . HLD (hyperlipidemia) 11/27/2018  . NSTEMI (non-ST elevated myocardial infarction) ([place]) 11/05/2016   9/18 PCI/DESx1 to p/mLAD  . Pancreatitis 1991  . Pneumonia    "twice" (11/05/2016)     Assessment: 59 year old female with hx of CABG in 2019 and a myocardial infarction in October 2020. She is admitted today with chest pain with radiation to arms. She took aspirin 325 mg this morning. She is on plavix prior to admission. She has a bump in her SCr and is hypokalemic. H/h plts ok.   Goal of Therapy:  Heparin level 0.3-0.7 units/ml Monitor platelets by anticoagulation protocol: Yes   Plan:  -Heparin bolus 3000 units followed by 700 units/hr -Daily HL, CBC -Check level tonight   Harvel Quale 03/04/2019,3:40 PM

## 2019-03-04 NOTE — ED Provider Notes (Signed)
[facility] EMERGENCY DEPARTMENT Provider Note   CSN: [id number] Arrival date & time: 03/04/19  [id number]     History Chief Complaint  Patient presents with  . Chest Pain    Gabrielle Page is a 59 y.o. female w PMHx CAD s/p CABG x3, GERD, diastolic HF, LBBB, presenting to the ED with complaint of CP which woke her from sleep around 5am. She took 324mg ASA and EMS gave 1 NTG which provided moderate relief of pain. Pain is described as sharp and burning, radiating to left shoulder and down her left arm. Pain feels similar to prior MI. Denies nausea, diaphoresis, abd pain, SOB, or other assoc sx.   Per recent cardiac cath in Oct 2020, pt has significant CAD. Followed by Dr. Bettina Gavia  The history is provided by the patient and medical records.       Past Medical History:  Diagnosis Date  . Asthma   . Chronic bronchitis ([place])    "usually get it q yr" (11/05/2016)  . GERD (gastroesophageal reflux disease)   . Headache    "weekly" (11/05/2016)  . Heart murmur    "dx'd when I was a child"  . Hepatitis 1991   "when I had my gallbladder out" (11/05/2016)  . History of blood transfusion 1985   S/P childbirth  . History of kidney stones   . HLD (hyperlipidemia) 11/27/2018  . NSTEMI (non-ST elevated myocardial infarction) ([place]) 11/05/2016   9/18 PCI/DESx1 to p/mLAD  . Pancreatitis 1991  . Pneumonia    "twice" (11/05/2016)    Patient Active Problem List   Diagnosis Date Noted  . Unstable angina ([place]) 03/04/2019  . HLD (hyperlipidemia) 11/27/2018  . Acute ST elevation myocardial infarction (STEMI) of inferolateral wall ([place]) 11/24/2018  . Chronic diastolic heart failure ([place]) 04/17/2017  . Hypokalemia 04/17/2017  . Coronary artery disease involving native coronary artery with angina pectoris ([place]) 04/08/2017  . Pericardial effusion 04/08/2017  . GERD (gastroesophageal reflux disease) 04/08/2017  . 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 LBBB (left bundle branch block)  11/05/2016    Past Surgical History:  Procedure Laterality Date  . ABDOMINAL HYSTERECTOMY  1990  . APPENDECTOMY  1992  . BILE DUCT EXPLORATION  1991   "reconstruction"  . CESAREAN SECTION  1985  . CHOLECYSTECTOMY OPEN  1991  . CORONARY ANGIOPLASTY WITH STENT PLACEMENT  11/05/2016  . CORONARY ARTERY BYPASS GRAFT N/A 03/26/2017   Procedure: CORONARY ARTERY BYPASS GRAFTING (CABG) x 3 WITH ENDOSCOPIC HARVESTING OF RIGHT SAPHENOUS VEIN;  Surgeon: Ivin Poot, MD;  Location: [place];  Service: Open Heart Surgery;  Laterality: N/A;  . CORONARY STENT INTERVENTION N/A 11/05/2016   Procedure: CORONARY STENT INTERVENTION;  Surgeon: Martinique, Peter M, MD;  Location: [place] [facility];  Service: Cardiovascular;  Laterality: N/A;  . CYSTOSCOPY WITH STENT PLACEMENT  "3 times" (11/05/2016)  . DILATION AND CURETTAGE OF UTERUS  1989  . INTRAVASCULAR ULTRASOUND/IVUS N/A 03/22/2017   Procedure: Intravascular Ultrasound/IVUS;  Surgeon: Martinique, Peter M, MD;  Location: [place] [facility];  Service: Cardiovascular;  Laterality: N/A;  . KNEE ARTHROSCOPY Right 2000s  . LEFT HEART CATH AND CORONARY ANGIOGRAPHY N/A 11/05/2016   Procedure: LEFT HEART CATH AND CORONARY ANGIOGRAPHY;  Surgeon: Martinique, Peter M, MD;  Location: [place] [facility];  Service: Cardiovascular;  Laterality: N/A;  . LEFT HEART CATH AND CORONARY ANGIOGRAPHY N/A 03/22/2017   Procedure: LEFT HEART CATH AND CORONARY ANGIOGRAPHY;  Surgeon: Martinique, Peter M, MD;  Location: [place]  CV [facility];  Service: Cardiovascular;  Laterality: N/A;  . NASAL SINUS SURGERY  ~ 2009   "exposed to mildew; had to have the infectoin drained; cut the outside of my face"  . OVARIAN CYST SURGERY  X 6  . RIGHT/LEFT HEART CATH AND CORONARY/GRAFT ANGIOGRAPHY N/A 11/24/2018   Procedure: RIGHT/LEFT HEART CATH AND CORONARY/GRAFT ANGIOGRAPHY;  Surgeon: Leonie Man, MD;  Location: [place] [facility];  Service: Cardiovascular;  Laterality: N/A;  . TEE WITHOUT CARDIOVERSION N/A  03/26/2017   Procedure: TRANSESOPHAGEAL ECHOCARDIOGRAM (TEE);  Surgeon: Prescott Gum, [name], MD;  Location: [place];  Service: Open Heart Surgery;  Laterality: N/A;     OB History   No obstetric history on file.     Family History  Problem Relation Age of Onset  . Hypertension Father   . Alcohol abuse Father   . Heart attack Father   . Diabetes Mellitus II Father   . Suicidality Mother     Social History   Tobacco Use  . Smoking status: Never Smoker  . Smokeless tobacco: Never Used  Substance Use Topics  . Alcohol use: Yes    Comment: 11/05/2016 "maybe 1 glass of wine/month"  . Drug use: No    Home Medications Prior to Admission medications   Medication Sig Start Date End Date Taking? Authorizing Provider  acetaminophen (TYLENOL) 325 MG tablet Take 325 mg by mouth every 6 (six) hours as needed for moderate pain or headache.    Yes [provider]  albuterol (VENTOLIN HFA) 108 (90 Base) MCG/ACT inhaler Inhale 2 puffs into the lungs every 6 (six) hours as needed for wheezing or shortness of breath.   Yes [provider]  aspirin EC 81 MG tablet Take 81 mg by mouth daily.   Yes [provider]  atorvastatin (LIPITOR) 80 MG tablet Take 1 tablet (80 mg total) by mouth daily at 6 PM. 11/27/18  Yes Isaiah Serge, NP  busPIRone (BUSPAR) 15 MG tablet Take 15 mg by mouth 3 (three) times daily.    Yes [provider]  clopidogrel (PLAVIX) 75 MG tablet Take 1 tablet (75 mg total) by mouth daily with breakfast. 11/28/18  Yes Isaiah Serge, NP  furosemide (LASIX) 40 MG tablet Take 40 mg by mouth daily. 02/17/19  Yes [provider]  metoprolol tartrate (LOPRESSOR) 25 MG tablet Take 1 tablet (25 mg total) by mouth 2 (two) times daily. 06/19/17  Yes Ivin Poot, MD  Multiple Vitamin (MULTIVITAMIN WITH MINERALS) TABS tablet Take 1 tablet by mouth daily.   Yes [provider]  nitroGLYCERIN (NITROSTAT) 0.4 MG SL tablet Place 0.4 mg under the  tongue every 5 (five) minutes as needed for chest pain.   Yes [provider]  pantoprazole (PROTONIX) 40 MG tablet Take 40 mg by mouth daily. 02/27/19  Yes [provider]  ranolazine (RANEXA) 500 MG 12 hr tablet Take 1 tablet (500 mg total) by mouth 2 (two) times daily. 12/03/18  Yes Loel Dubonnet, NP  sertraline (ZOLOFT) 25 MG tablet Take 25 mg by mouth daily. 01/27/19  Yes [provider]  traZODone (DESYREL) 150 MG tablet Take 300 mg by mouth at bedtime.  09/22/18  Yes [provider]  Icosapent Ethyl 1 g CAPS Take 2 capsules (2 g total) by mouth 2 (two) times daily. Patient not taking: Reported on 03/04/2019 11/27/18   Isaiah Serge, NP    Allergies    Amiodarone, Colchicine, Compazine [prochlorperazine edisylate], and  Other  Review of Systems   Review of Systems  Cardiovascular: Positive for chest pain.  All other systems reviewed and are negative.   Physical Exam Updated Vital Signs BP (!) 91/57 (BP Location: Right Arm)   Pulse 69   Temp 98.6 F (37 C) (Oral)   Resp 17   SpO2 97%   Physical Exam Vitals and nursing note reviewed.  Constitutional:      General: She is not in acute distress.    Appearance: She is well-developed. She is not ill-appearing.  HENT:     Head: Normocephalic and atraumatic.  Eyes:     Conjunctiva/sclera: Conjunctivae normal.  Cardiovascular:     Rate and Rhythm: Normal rate and regular rhythm.     Heart sounds: Murmur present.  Pulmonary:     Effort: Pulmonary effort is normal. No respiratory distress.     Breath sounds: Normal breath sounds.  Abdominal:     General: Bowel sounds are normal.     Palpations: Abdomen is soft.     Tenderness: There is no abdominal tenderness. There is no guarding or rebound.  Musculoskeletal:     Right lower leg: No edema.     Left lower leg: No edema.  Skin:    General: Skin is warm.  Neurological:     Mental Status: She is alert.  Psychiatric:        Behavior:  Behavior normal.     ED Results / Procedures / Treatments   Labs (all labs ordered are listed, but only abnormal results are displayed) Labs Reviewed  BASIC METABOLIC PANEL - Abnormal; Notable for the following components:      Result Value   Potassium 2.7 (*)    CO2 21 (*)    BUN 23 (*)    Creatinine, Ser 1.21 (*)    GFR calc non Af Amer 50 (*)    GFR calc Af Amer 58 (*)    All other components within normal limits  CBC WITH DIFFERENTIAL/PLATELET - Abnormal; Notable for the following components:   RBC 3.47 (*)    Hemoglobin 10.9 (*)    HCT 33.4 (*)    All other components within normal limits  MAGNESIUM - Abnormal; Notable for the following components:   Magnesium 1.4 (*)    All other components within normal limits  RESPIRATORY PANEL BY RT PCR (FLU A&B, COVID)  VITAMIN B12  FOLATE  IRON AND TIBC  FERRITIN  RETICULOCYTES  CBC  TROPONIN I (HIGH SENSITIVITY)  TROPONIN I (HIGH SENSITIVITY)  TROPONIN I (HIGH SENSITIVITY)    EKG EKG Interpretation  Date/Time:  Wednesday March 04 2019 09:56:22 EST Ventricular Rate:  62 PR Interval:    QRS Duration: 167 QT Interval:  484 QTC Calculation: 492 R Axis:   24 Text Interpretation: Sinus rhythm Consider left atrial enlargement Left bundle branch block Confirmed by Madalyn Rob [phone]) on 03/04/2019 9:59:40 AM   Radiology DG Chest 2 View  Result Date: 03/04/2019 CLINICAL DATA:  Chest pain and shortness of breath EXAM: CHEST - 2 VIEW COMPARISON:  November 13, 2018 FINDINGS: Lungs are clear. Heart is upper normal in size with pulmonary vascularity normal. Patient is status post coronary artery bypass grafting. No evident adenopathy. No pneumothorax. No bone lesions. IMPRESSION: Lungs clear. Heart upper normal in size. Status post coronary artery bypass grafting. No evident adenopathy. Electronically Signed   By: Lowella Grip III M.D.   On: 03/04/2019 10:33    Procedures Procedures (including critical care  time)  Medications Ordered in ED Medications  nitroGLYCERIN (NITROSTAT) SL tablet 0.4 mg (has no administration in time range)  potassium chloride 10 mEq in 100 mL IVPB (10 mEq Intravenous New Bag/Given 03/04/19 1524)  magnesium sulfate IVPB 2 g 50 mL (2 g Intravenous New Bag/Given 03/04/19 1529)  fentaNYL (SUBLIMAZE) injection 50 mcg (50 mcg Intravenous Given 03/04/19 1050)  acetaminophen (TYLENOL) tablet 650 mg (650 mg Oral Given 03/04/19 1051)  sodium chloride 0.9 % bolus 500 mL (0 mLs Intravenous Stopped 03/04/19 1303)  fentaNYL (SUBLIMAZE) injection 50 mcg (50 mcg Intravenous Given 03/04/19 1258)  fentaNYL (SUBLIMAZE) injection 50 mcg (50 mcg Intravenous Given 03/04/19 1524)    ED Course  I have reviewed the triage vital signs and the nursing notes.  Pertinent labs & imaging results that were available during my care of the patient were reviewed by me and considered in my medical decision making (see chart for details).  Clinical Course as of Mar 03 1538  Wed Mar 04, 2019  1236 Patient reevaluated.  Reports chest pain initially improved though is returning.  Redose fentanyl.  Pressure still soft, continue to hold on nitro.   [JR]  1253 Cardiology to see pt   [JR]    Clinical Course User Index [JR] Florabel Faulks, [place] N, PA-C   MDM Rules/Calculators/A&P                      Patient with known history of CAD, followed by Dr. Bettina Gavia, presenting to the emergency department with chest pain that woke her from sleep this morning.  Pain is described as sharp and burning in the center of her chest radiating to the left arm and shoulder.  She states symptoms feel similar to prior episodes of MI.  She had 1 dose of nitroglycerin and full dose aspirin prior to arrival which provided some interval improvement in symptoms, however they are slowly returning on evaluation.  Initial EKG without ischemic changes.  Initial troponin is 17.  Potassium is low at 2.7, IV replacement ordered.  Magnesium level  pending.  On reevaluation, patient reports recurrence of chest pain despite fentanyl and Tylenol.  Unable to dose with nitroglycerin or morphine due to low blood pressure.  Cardiology to evaluate patient.  Patient will need admission.  Cardiology to admit.  Final Clinical Impression(s) / ED Diagnoses Final diagnoses:  Unstable angina [facility])    Rx / DC Orders ED Discharge Orders    None       Traver Meckes, [place] N, PA-C 03/04/19 1540    Lucrezia Starch, MD 03/05/19 1054    Lucrezia Starch, MD 03/05/19 1057

## 2019-03-04 NOTE — ED Notes (Addendum)
Pt changed in gown and on monitoring devices.

## 2019-03-05 ENCOUNTER — Inpatient Hospital Stay (HOSPITAL_COMMUNITY)
Admission: EM | Disposition: A | Discharge status: Home / Self Care | Payer: Self-pay | Source: Home / Self Care | Attending: Internal Medicine

## 2019-03-05 DIAGNOSIS — I25110 Atherosclerotic heart disease of native coronary artery with unstable angina pectoris: Principal | ICD-10-CM

## 2019-03-05 DIAGNOSIS — I25710 Atherosclerosis of autologous vein coronary artery bypass graft(s) with unstable angina pectoris: Secondary | ICD-10-CM

## 2019-03-05 DIAGNOSIS — E876 Hypokalemia: Secondary | ICD-10-CM

## 2019-03-05 HISTORY — PX: LEFT HEART CATH AND CORS/GRAFTS ANGIOGRAPHY: CATH118250

## 2019-03-05 LAB — CBC
HCT: 29.7 % — ABNORMAL LOW (ref 36.0–46.0)
HCT: 31 % — ABNORMAL LOW (ref 36.0–46.0)
Hemoglobin: 9.8 g/dL — ABNORMAL LOW (ref 12.0–15.0)
Hemoglobin: 10.1 g/dL — ABNORMAL LOW (ref 12.0–15.0)
MCH: 31.5 pg (ref 26.0–34.0)
MCH: 31.5 pg (ref 26.0–34.0)
MCHC: 33 g/dL (ref 30.0–36.0)
MCHC: 32.6 g/dL (ref 30.0–36.0)
MCV: 95.5 fL (ref 80.0–100.0)
MCV: 96.6 fL (ref 80.0–100.0)
Platelets: 271 10*3/uL (ref 150–400)
Platelets: 284 10*3/uL (ref 150–400)
RBC: 3.11 MIL/uL — ABNORMAL LOW (ref 3.87–5.11)
RBC: 3.21 MIL/uL — ABNORMAL LOW (ref 3.87–5.11)
RDW: 13.9 % (ref 11.5–15.5)
RDW: 14.2 % (ref 11.5–15.5)
WBC: 4 10*3/uL (ref 4.0–10.5)
WBC: 3.9 10*3/uL — ABNORMAL LOW (ref 4.0–10.5)
nRBC: 0 % (ref 0.0–0.2)
nRBC: 0 % (ref 0.0–0.2)

## 2019-03-05 LAB — HEPARIN LEVEL (UNFRACTIONATED): Heparin Unfractionated: 0.35 IU/mL (ref 0.30–0.70)

## 2019-03-05 LAB — COMPREHENSIVE METABOLIC PANEL
ALT: 21 U/L (ref 0–44)
AST: 18 U/L (ref 15–41)
Albumin: 3.2 g/dL — ABNORMAL LOW (ref 3.5–5.0)
Alkaline Phosphatase: 42 U/L (ref 38–126)
Anion gap: 6 (ref 5–15)
BUN: 14 mg/dL (ref 6–20)
CO2: 21 mmol/L — ABNORMAL LOW (ref 22–32)
Calcium: 8.5 mg/dL — ABNORMAL LOW (ref 8.9–10.3)
Chloride: 112 mmol/L — ABNORMAL HIGH (ref 98–111)
Creatinine, Ser: 0.88 mg/dL (ref 0.44–1.00)
GFR calc Af Amer: >60 mL/min (ref >60)
GFR calc non Af Amer: >60 mL/min (ref >60)
Glucose, Bld: 95 mg/dL (ref 70–99)
Potassium: 3 mmol/L — ABNORMAL LOW (ref 3.5–5.1)
Sodium: 139 mmol/L (ref 135–145)
Total Bilirubin: 0.6 mg/dL (ref 0.3–1.2)
Total Protein: 5.4 g/dL — ABNORMAL LOW (ref 6.5–8.1)

## 2019-03-05 LAB — LIPID PANEL
Cholesterol: 133 mg/dL (ref 0–200)
HDL: 30 mg/dL — ABNORMAL LOW (ref >40)
LDL Cholesterol: 64 mg/dL (ref 0–99)
Total CHOL/HDL Ratio: 4.4 RATIO
Triglycerides: 194 mg/dL — ABNORMAL HIGH (ref <150)
VLDL: 39 mg/dL (ref 0–40)

## 2019-03-05 LAB — MAGNESIUM: Magnesium: 1.9 mg/dL (ref 1.7–2.4)

## 2019-03-05 LAB — CREATININE, SERUM
Creatinine, Ser: 0.8 mg/dL (ref 0.44–1.00)
GFR calc Af Amer: >60 mL/min (ref >60)
GFR calc non Af Amer: >60 mL/min (ref >60)

## 2019-03-05 SURGERY — LEFT HEART CATH AND CORS/GRAFTS ANGIOGRAPHY
Anesthesia: LOCAL

## 2019-03-05 MED ORDER — HEPARIN (PORCINE) IN NACL 1000-0.9 UT/500ML-% IV SOLN
INTRAVENOUS | Status: DC | PRN
  Administered 2019-03-05 (×2): 500 mL

## 2019-03-05 MED ORDER — POTASSIUM CHLORIDE CRYS ER 20 MEQ PO TBCR
60.0000 meq | EXTENDED_RELEASE_TABLET | Freq: Once | ORAL | Status: AC
  Administered 2019-03-05: 60 meq via ORAL
  Filled 2019-03-05: qty 3

## 2019-03-05 MED ORDER — LIDOCAINE HCL (PF) 1 % IJ SOLN
INTRAMUSCULAR | Status: DC | PRN
  Administered 2019-03-05: 2 mL
  Administered 2019-03-05: 14 mL

## 2019-03-05 MED ORDER — MIDAZOLAM HCL 2 MG/2ML IJ SOLN
INTRAMUSCULAR | Status: AC
  Filled 2019-03-05: qty 2

## 2019-03-05 MED ORDER — LIDOCAINE HCL (PF) 1 % IJ SOLN
INTRAMUSCULAR | Status: AC
  Filled 2019-03-05: qty 30

## 2019-03-05 MED ORDER — ENSURE ENLIVE PO LIQD
237.0000 mL | Freq: Every day | ORAL | Status: DC
  Administered 2019-03-06: 237 mL via ORAL

## 2019-03-05 MED ORDER — HEPARIN (PORCINE) IN NACL 1000-0.9 UT/500ML-% IV SOLN
INTRAVENOUS | Status: AC
  Filled 2019-03-05: qty 1000

## 2019-03-05 MED ORDER — FENTANYL CITRATE (PF) 100 MCG/2ML IJ SOLN
INTRAMUSCULAR | Status: DC | PRN
  Administered 2019-03-05: 25 ug via INTRAVENOUS

## 2019-03-05 MED ORDER — IOHEXOL 350 MG/ML SOLN
INTRAVENOUS | Status: DC | PRN
  Administered 2019-03-05: 70 mL

## 2019-03-05 MED ORDER — SODIUM CHLORIDE 0.9% FLUSH
3.0000 mL | Freq: Two times a day (BID) | INTRAVENOUS | Status: DC
  Administered 2019-03-07: 3 mL via INTRAVENOUS

## 2019-03-05 MED ORDER — VERAPAMIL HCL 2.5 MG/ML IV SOLN
INTRAVENOUS | Status: AC
  Filled 2019-03-05: qty 2

## 2019-03-05 MED ORDER — RANOLAZINE ER 500 MG PO TB12
1000.0000 mg | ORAL_TABLET | Freq: Two times a day (BID) | ORAL | Status: DC
  Administered 2019-03-05 – 2019-03-07 (×4): 1000 mg via ORAL
  Filled 2019-03-05 (×4): qty 2

## 2019-03-05 MED ORDER — FENTANYL CITRATE (PF) 100 MCG/2ML IJ SOLN
INTRAMUSCULAR | Status: AC
  Filled 2019-03-05: qty 2

## 2019-03-05 MED ORDER — ADULT MULTIVITAMIN W/MINERALS CH
1.0000 | ORAL_TABLET | Freq: Every day | ORAL | Status: DC
  Administered 2019-03-05 – 2019-03-07 (×3): 1 via ORAL
  Filled 2019-03-05 (×3): qty 1

## 2019-03-05 MED ORDER — SODIUM CHLORIDE 0.9 % IV SOLN: 250.0000 mL | INTRAVENOUS | Status: DC | PRN

## 2019-03-05 MED ORDER — MIDAZOLAM HCL 2 MG/2ML IJ SOLN
INTRAMUSCULAR | Status: DC | PRN
  Administered 2019-03-05 (×2): 1 mg via INTRAVENOUS

## 2019-03-05 MED ORDER — HEPARIN SODIUM (PORCINE) 5000 UNIT/ML IJ SOLN
5000.0000 [IU] | Freq: Three times a day (TID) | INTRAMUSCULAR | Status: DC
  Administered 2019-03-05 – 2019-03-07 (×5): 5000 [IU] via SUBCUTANEOUS
  Filled 2019-03-05 (×5): qty 1

## 2019-03-05 MED ORDER — SODIUM CHLORIDE 0.9% FLUSH: 3.0000 mL | INTRAVENOUS | Status: DC | PRN

## 2019-03-05 MED ORDER — HYDRALAZINE HCL 20 MG/ML IJ SOLN: 10.0000 mg | INTRAMUSCULAR | Status: AC | PRN

## 2019-03-05 MED ORDER — SODIUM CHLORIDE 0.9 % IV SOLN: INTRAVENOUS | Status: AC

## 2019-03-05 MED ORDER — LABETALOL HCL 5 MG/ML IV SOLN: 10.0000 mg | INTRAVENOUS | Status: AC | PRN

## 2019-03-05 SURGICAL SUPPLY — 10 items
CATH INFINITI 5FR MULTPACK ANG (CATHETERS) ×1 IMPLANT
CLOSURE MYNX CONTROL 5F (Vascular Products) ×1 IMPLANT
GLIDESHEATH SLEND SS 6F .021 (SHEATH) ×1 IMPLANT
GUIDEWIRE INQWIRE 1.5J.035X260 (WIRE) IMPLANT
INQWIRE 1.5J .035X260CM (WIRE) ×2
KIT HEART LEFT (KITS) ×2 IMPLANT
PACK CARDIAC CATHETERIZATION (CUSTOM PROCEDURE TRAY) ×2 IMPLANT
SHEATH PINNACLE 5F 10CM (SHEATH) ×1 IMPLANT
TRANSDUCER W/STOPCOCK (MISCELLANEOUS) ×2 IMPLANT
TUBING CIL FLEX 10 FLL-RA (TUBING) ×2 IMPLANT

## 2019-03-05 NOTE — Progress Notes (Signed)
Progress Note  Patient Name: Gabrielle Page Date of Encounter: 03/05/2019  Primary Cardiologist: Shirlee More, MD   Subjective   Continue to have 5/10 chest pain, no SOB.   Inpatient Medications    Scheduled Meds: . aspirin EC  81 mg Oral Daily  . atorvastatin  80 mg Oral q1800  . busPIRone  15 mg Oral TID  . clopidogrel  75 mg Oral Q breakfast  . metoprolol tartrate  25 mg Oral BID  . pantoprazole  40 mg Oral BID  . ranolazine  500 mg Oral BID  . sertraline  25 mg Oral Daily  . sodium chloride flush  3 mL Intravenous Q12H  . traZODone  300 mg Oral QHS   Continuous Infusions: . sodium chloride 10 mL/hr at 03/05/19 0100  . sodium chloride    . sodium chloride 1 mL/kg/hr (03/05/19 0546)  . heparin 700 Units/hr (03/04/19 1730)   PRN Meds: sodium chloride, acetaminophen, ALPRAZolam, fentaNYL (SUBLIMAZE) injection, nitroGLYCERIN, ondansetron (ZOFRAN) IV, sodium chloride flush   Vital Signs    Vitals:   03/04/19 1943 03/05/19 0057 03/05/19 0409 03/05/19 0727  BP: (!) 90/56 (!) 84/54 (!) 91/54 (!) 87/67  Pulse: 68 60 61 (!) 55  Resp: 20 20 20 18   Temp: 98.3 F (36.8 C) 97.6 F (36.4 C) 97.7 F (36.5 C) 98 F (36.7 C)  TempSrc: Oral Oral Oral Oral  SpO2: 98% 99% 100% 99%  Weight:   52 kg     Intake/Output Summary (Last 24 hours) at 03/05/2019 0852 Last data filed at 03/05/2019 0348 Gross per 24 hour  Intake 481.45 ml  Output 525 ml  Net -43.55 ml   Last 3 Weights 03/05/2019 03/04/2019 01/29/2019  Weight (lbs) 114 lb 9.6 oz 117 lb 117 lb  Weight (kg) 51.982 kg 53.071 kg 53.071 kg      Telemetry    NSR without significant ventricular ectopy - Personally Reviewed  ECG    NSR with LBBB - Personally Reviewed  Physical Exam   GEN: No acute distress.   Neck: No JVD Cardiac: RRR, no murmurs, rubs, or gallops.  Respiratory: Clear to auscultation bilaterally. GI: Soft, nontender, non-distended  MS: No edema; No deformity. Neuro:  Nonfocal  Psych:  Normal affect   Labs    High Sensitivity Troponin:   Recent Labs  Lab 03/04/19 1002 03/04/19 1300 03/04/19 1752  TROPONINIHS 17 16 18*      Chemistry Recent Labs  Lab 03/04/19 1002 03/05/19 0452  NA 138 139  K 2.7* 3.0*  CL 108 112*  CO2 21* 21*  GLUCOSE 90 95  BUN 23* 14  CREATININE 1.21* 0.88  CALCIUM 9.2 8.5*  PROT  --  5.4*  ALBUMIN  --  3.2*  AST  --  18  ALT  --  21  ALKPHOS  --  42  BILITOT  --  0.6  GFRNONAA 50* >60  GFRAA 58* >60  ANIONGAP 9 6     Hematology Recent Labs  Lab 03/04/19 1002 03/04/19 1752 03/04/19 2039 03/05/19 0452  WBC 5.3  --  6.5 4.0  RBC 3.47* 3.51* 3.25* 3.11*  HGB 10.9*  --  10.3* 9.8*  HCT 33.4*  --  31.0* 29.7*  MCV 96.3  --  95.4 95.5  MCH 31.4  --  31.7 31.5  MCHC 32.6  --  33.2 33.0  RDW 13.8  --  13.9 13.9  PLT 360  --  321 271    BNPNo results for  input(s): BNP, PROBNP in the last 168 hours.   DDimer No results for input(s): DDIMER in the last 168 hours.   Radiology    DG Chest 2 View  Result Date: 03/04/2019 CLINICAL DATA:  Chest pain and shortness of breath EXAM: CHEST - 2 VIEW COMPARISON:  November 13, 2018 FINDINGS: Lungs are clear. Heart is upper normal in size with pulmonary vascularity normal. Patient is status post coronary artery bypass grafting. No evident adenopathy. No pneumothorax. No bone lesions. IMPRESSION: Lungs clear. Heart upper normal in size. Status post coronary artery bypass grafting. No evident adenopathy. Electronically Signed   By: Lowella Grip III M.D.   On: 03/04/2019 10:33    Cardiac Studies   Cath 11/24/2019  Dist LM lesion is 40% stenosed.  Ost LAD lesion is 80% stenosed. -As noted by Dr. Martinique and regional cath  Prox LAD to Mid LAD stent across the diagonal is widely patent  Mid LAD-1 lesion is 60% stenosed. Mid LAD-2 lesion is 80% stenosed.  Ost 1st Diag lesion is 60% stenosed.  LIMA graft was visualized by angiography and is small -to a relatively small caliber  downstream LAD.  SVG-OM1 graft was visualized by angiography and is normal in caliber. Origin to Prox Graft lesion is 100% stenosed.  SVG-1ST DIAG graft was visualized by angiography and is normal in caliber. Origin to Prox Graft lesion is 100% stenosed.  1st Mrg lesion is 99% stenosed at the graft insertion site  Balloon angioplasty was attempted, lesion could not be crossed. Unfortunately lesion progressed.  Post intervention, there is a 100% residual stenosis. After multiple attempts to cross, procedure aborted.  ------------------------------------------  LV end diastolic pressure is normal. Right heart pressures normal.  Mildly reduced cardiac output/index   SUMMARY  PeriprocedureType 4 MI -with now occluded occlusion of native OM1 at anastomosis site. Attempted PTCA of 99% lesion, unable to advance wire beyond the lesion likely due to suture stricture. With wire removal there was now acute occlusion of the downstream OM.  Occluded SVG-OM and SVG-diagonal  Patent but very small LIMA-LAD with competitive flow to the distal LAD.  Likely significant ostial LAD lesion but with TIMI-3 flow in the diagonal branch. The LAD itself tapers to a relatively small caliber distally with competitive flow. The stents itself is widely patent  Totally normal right heart cath pressures with no evidence of CHF. Right atrial, RVEDP, LVEDP and PCWP pressures are all normal.  Mildly reduced cardiac output of 2.44 with an index of 2.91.  RECOMMENDATIONS:   Unfortunately it seems like the patient will likely complete an inferolateral infarct with occluded OM.  She will be admitted to the ICU for management.  We will start IV heparin 6 hours post sheath removal.  If blood pressure tolerates, will use IV nitroglycerin, but otherwise will use IV and oral narcotics for pain control.  Cycle troponin levels.  Restart most home medications with exception of Lasix.  I suspect that she should be  in the hospital least 2 to 3 days to for completion of her infarct.  Monitor for signs of hypotension etc.  Echo 11/25/2018 1. Left ventricular ejection fraction, by visual estimation, is 55 to 60%. The left ventricle has normal function. Normal left ventricular size. There is mildly increased left ventricular hypertrophy.  2. Elevated mean left atrial pressure.  3. Left ventricular diastolic Doppler parameters are consistent with impaired relaxation pattern of LV diastolic filling.  4. Global right ventricle has mildly reduced systolic function.The right ventricular  size is normal. No increase in right ventricular wall thickness.  5. Left atrial size was mildly dilated.  6. Right atrial size was normal.  7. The mitral valve is abnormal. Mild to moderate mitral valve regurgitation.  8. The tricuspid valve is abnormal. Tricuspid valve regurgitation mild-moderate.  9. Normal pulmonary artery systolic pressure. RVSP 28 mmHg 10. The inferior vena cava is normal in size with <50% respiratory variability, suggesting right atrial pressure of 8 mmHg. 11. Abnormal septal motion consistent with left bundle branch block  Patient Profile     59 y.o. female with PMH of CAD, anemia and HLD presented with chest pain  Assessment & Plan    1. Unstable angina  - continue aspirin and plavix.   - most recent cath 11/24/2018 showed patent small LIMA-LAD, occluded SVG-OM1, and occluded SVG-D1. Native OM1 at 99% stenosis, PCI was unsuccessful.   - had recurrent worsening chest pain since yesterday morning, plan for relook cath given recurrent symptom.  -Risk and benefit of procedure explained to the patient who display clear understanding and agree to proceed. Discussed with patient possible procedural risk include bleeding, vascular injury, renal injury, arrythmia, MI, stroke and loss of limb or life.  2. CAD s/p CABG 2019 (LIMA-AD, SVG-diagonal, SVG-OM)  3. Anemia: stable  4. HLD: continue lipitor.   5.  Hypokalemia: given 65meq KCl x 1 dose this morning.  6. Postop afib: no recurrence.   For questions or updates, please contact [place] Please consult [email or web address] for contact info under        Signed, Almyra Deforest, [place]  03/05/2019, 8:52 AM

## 2019-03-05 NOTE — H&P (View-Only) (Signed)
Progress Note  Patient Name: Gabrielle Page Date of Encounter: 03/05/2019  Primary Cardiologist: Shirlee More, MD   Subjective   Continue to have 5/10 chest pain, no SOB.   Inpatient Medications    Scheduled Meds: . aspirin EC  81 mg Oral Daily  . atorvastatin  80 mg Oral q1800  . busPIRone  15 mg Oral TID  . clopidogrel  75 mg Oral Q breakfast  . metoprolol tartrate  25 mg Oral BID  . pantoprazole  40 mg Oral BID  . ranolazine  500 mg Oral BID  . sertraline  25 mg Oral Daily  . sodium chloride flush  3 mL Intravenous Q12H  . traZODone  300 mg Oral QHS   Continuous Infusions: . sodium chloride 10 mL/hr at 03/05/19 0100  . sodium chloride    . sodium chloride 1 mL/kg/hr (03/05/19 0546)  . heparin 700 Units/hr (03/04/19 1730)   PRN Meds: sodium chloride, acetaminophen, ALPRAZolam, fentaNYL (SUBLIMAZE) injection, nitroGLYCERIN, ondansetron (ZOFRAN) IV, sodium chloride flush   Vital Signs    Vitals:   03/04/19 1943 03/05/19 0057 03/05/19 0409 03/05/19 0727  BP: (!) 90/56 (!) 84/54 (!) 91/54 (!) 87/67  Pulse: 68 60 61 (!) 55  Resp: 20 20 20 18   Temp: 98.3 F (36.8 C) 97.6 F (36.4 C) 97.7 F (36.5 C) 98 F (36.7 C)  TempSrc: Oral Oral Oral Oral  SpO2: 98% 99% 100% 99%  Weight:   52 kg     Intake/Output Summary (Last 24 hours) at 03/05/2019 0852 Last data filed at 03/05/2019 0348 Gross per 24 hour  Intake 481.45 ml  Output 525 ml  Net -43.55 ml   Last 3 Weights 03/05/2019 03/04/2019 01/29/2019  Weight (lbs) 114 lb 9.6 oz 117 lb 117 lb  Weight (kg) 51.982 kg 53.071 kg 53.071 kg      Telemetry    NSR without significant ventricular ectopy - Personally Reviewed  ECG    NSR with LBBB - Personally Reviewed  Physical Exam   GEN: No acute distress.   Neck: No JVD Cardiac: RRR, no murmurs, rubs, or gallops.  Respiratory: Clear to auscultation bilaterally. GI: Soft, nontender, non-distended  MS: No edema; No deformity. Neuro:  Nonfocal  Psych:  Normal affect   Labs    High Sensitivity Troponin:   Recent Labs  Lab 03/04/19 1002 03/04/19 1300 03/04/19 1752  TROPONINIHS 17 16 18*      Chemistry Recent Labs  Lab 03/04/19 1002 03/05/19 0452  NA 138 139  K 2.7* 3.0*  CL 108 112*  CO2 21* 21*  GLUCOSE 90 95  BUN 23* 14  CREATININE 1.21* 0.88  CALCIUM 9.2 8.5*  PROT  --  5.4*  ALBUMIN  --  3.2*  AST  --  18  ALT  --  21  ALKPHOS  --  42  BILITOT  --  0.6  GFRNONAA 50* >60  GFRAA 58* >60  ANIONGAP 9 6     Hematology Recent Labs  Lab 03/04/19 1002 03/04/19 1752 03/04/19 2039 03/05/19 0452  WBC 5.3  --  6.5 4.0  RBC 3.47* 3.51* 3.25* 3.11*  HGB 10.9*  --  10.3* 9.8*  HCT 33.4*  --  31.0* 29.7*  MCV 96.3  --  95.4 95.5  MCH 31.4  --  31.7 31.5  MCHC 32.6  --  33.2 33.0  RDW 13.8  --  13.9 13.9  PLT 360  --  321 271    BNPNo results for  input(s): BNP, PROBNP in the last 168 hours.   DDimer No results for input(s): DDIMER in the last 168 hours.   Radiology    DG Chest 2 View  Result Date: 03/04/2019 CLINICAL DATA:  Chest pain and shortness of breath EXAM: CHEST - 2 VIEW COMPARISON:  November 13, 2018 FINDINGS: Lungs are clear. Heart is upper normal in size with pulmonary vascularity normal. Patient is status post coronary artery bypass grafting. No evident adenopathy. No pneumothorax. No bone lesions. IMPRESSION: Lungs clear. Heart upper normal in size. Status post coronary artery bypass grafting. No evident adenopathy. Electronically Signed   By: Lowella Grip III M.D.   On: 03/04/2019 10:33    Cardiac Studies   Cath 11/24/2019  Dist LM lesion is 40% stenosed.  Ost LAD lesion is 80% stenosed. -As noted by Dr. [place] and regional cath  Prox LAD to Mid LAD stent across the diagonal is widely patent  Mid LAD-1 lesion is 60% stenosed. Mid LAD-2 lesion is 80% stenosed.  Ost 1st Diag lesion is 60% stenosed.  LIMA graft was visualized by angiography and is small -to a relatively small caliber  downstream LAD.  SVG-OM1 graft was visualized by angiography and is normal in caliber. Origin to Prox Graft lesion is 100% stenosed.  SVG-1ST DIAG graft was visualized by angiography and is normal in caliber. Origin to Prox Graft lesion is 100% stenosed.  1st Mrg lesion is 99% stenosed at the graft insertion site  Balloon angioplasty was attempted, lesion could not be crossed. Unfortunately lesion progressed.  Post intervention, there is a 100% residual stenosis. After multiple attempts to cross, procedure aborted.  ------------------------------------------  LV end diastolic pressure is normal. Right heart pressures normal.  Mildly reduced cardiac output/index   SUMMARY  PeriprocedureType 4 MI -with now occluded occlusion of native OM1 at anastomosis site. Attempted PTCA of 99% lesion, unable to advance wire beyond the lesion likely due to suture stricture. With wire removal there was now acute occlusion of the downstream OM.  Occluded SVG-OM and SVG-diagonal  Patent but very small LIMA-LAD with competitive flow to the distal LAD.  Likely significant ostial LAD lesion but with TIMI-3 flow in the diagonal branch. The LAD itself tapers to a relatively small caliber distally with competitive flow. The stents itself is widely patent  Totally normal right heart cath pressures with no evidence of CHF. Right atrial, RVEDP, LVEDP and PCWP pressures are all normal.  Mildly reduced cardiac output of 2.44 with an index of 2.91.  RECOMMENDATIONS:   Unfortunately it seems like the patient will likely complete an inferolateral infarct with occluded OM.  She will be admitted to the ICU for management.  We will start IV heparin 6 hours post sheath removal.  If blood pressure tolerates, will use IV nitroglycerin, but otherwise will use IV and oral narcotics for pain control.  Cycle troponin levels.  Restart most home medications with exception of Lasix.  I suspect that she should be  in the hospital least 2 to 3 days to for completion of her infarct.  Monitor for signs of hypotension etc.  Echo 11/25/2018 1. Left ventricular ejection fraction, by visual estimation, is 55 to 60%. The left ventricle has normal function. Normal left ventricular size. There is mildly increased left ventricular hypertrophy.  2. Elevated mean left atrial pressure.  3. Left ventricular diastolic Doppler parameters are consistent with impaired relaxation pattern of LV diastolic filling.  4. Global right ventricle has mildly reduced systolic function.The right ventricular  size is normal. No increase in right ventricular wall thickness.  5. Left atrial size was mildly dilated.  6. Right atrial size was normal.  7. The mitral valve is abnormal. Mild to moderate mitral valve regurgitation.  8. The tricuspid valve is abnormal. Tricuspid valve regurgitation mild-moderate.  9. Normal pulmonary artery systolic pressure. RVSP 28 mmHg 10. The inferior vena cava is normal in size with <50% respiratory variability, suggesting right atrial pressure of 8 mmHg. 11. Abnormal septal motion consistent with left bundle branch block  Patient Profile     59 y.o. female with PMH of CAD, anemia and HLD presented with chest pain  Assessment & Plan    1. Unstable angina  - continue aspirin and plavix.   - most recent cath 11/24/2018 showed patent small LIMA-LAD, occluded SVG-OM1, and occluded SVG-D1. Native OM1 at 99% stenosis, PCI was unsuccessful.   - had recurrent worsening chest pain since yesterday morning, plan for relook cath given recurrent symptom.  -Risk and benefit of procedure explained to the patient who display clear understanding and agree to proceed. Discussed with patient possible procedural risk include bleeding, vascular injury, renal injury, arrythmia, MI, stroke and loss of limb or life.  2. CAD s/p CABG 2019 (LIMA-AD, SVG-diagonal, SVG-OM)  3. Anemia: stable  4. HLD: continue lipitor.   5.  Hypokalemia: given 46meq KCl x 1 dose this morning.  6. Postop afib: no recurrence.   For questions or updates, please contact [place] Please consult [email or web address] for contact info under        Signed, Almyra Deforest, [place]  03/05/2019, 8:52 AM

## 2019-03-05 NOTE — Progress Notes (Signed)
Initial Nutrition Assessment  DOCUMENTATION CODES:   Not applicable  INTERVENTION:   -Ensure Enlive po daily, each supplement provides 350 kcal and 20 grams of protein -MVI with minerals daily  NUTRITION DIAGNOSIS:   Increased nutrient needs related to chronic illness(CAD) as evidenced by estimated needs.  GOAL:   Patient will meet greater than or equal to 90% of their needs  MONITOR:   PO intake, Supplement acceptance, Labs, Weight trends, Skin, I & O's  REASON FOR ASSESSMENT:   Malnutrition Screening Tool    ASSESSMENT:   Gabrielle Page is a 59 y.o. female with hx of CAD, s/p CABG 2019 with LIMA to LAD, SVG to Diag and SVG to OM and post operative PAF and pericardial effusion. 11/2018 with acute inferior lateral MI occluded OM unable to open, and occluded VGs, iron def anemia now admitted with chest pain.  Pt admitted with chest pain.   Reviewed I/O's: -44 ml x 24 hours  UOP: 525 ml x 24 hours  Pt in with MD at time of visit. Plan for possible cath later today or tomorrow.   Pt with good appetite; noted meal completion 75%.   Reviewed wt hx; noted pt has experienced a 7.4% wt loss over the past 5 months, which is not significant for time frame.   Medications reviewed and include sodium chloride infusion @ 10 ml/hr.   Labs reviewed: K: 3.0 (on PO supplementation), Mg: 1.4.  Diet Order:   Diet Order            Diet NPO time specified Except for: Sips with Meds  Diet effective midnight              EDUCATION NEEDS:   No education needs have been identified at this time  Skin:  Skin Assessment: Reviewed RN Assessment  Last BM:  03/03/19  Height:   Ht Readings from Last 1 Encounters:  01/29/19 5\' 3"  (1.6 m)    Weight:   Wt Readings from Last 1 Encounters:  03/05/19 52 kg    Ideal Body Weight:  52.3 kg  BMI:  Body mass index is 20.3 kg/m.  Estimated Nutritional Needs:   Kcal:  1400-1600  Protein:  65-80 grams  Fluid:  > 1.4  L    Eilene Voigt A. Jimmye Norman, RD, LDN, [place] Registered Dietitian II Certified Diabetes Care and Education Specialist Pager: [phone] After hours Pager: [phone]

## 2019-03-05 NOTE — Progress Notes (Signed)
ANTICOAGULATION CONSULT NOTE - Follow Up Consult  Pharmacy Consult for IV Heparin Indication: chest pain/ACS  Allergies  Allergen Reactions  . Amiodarone Diarrhea and Nausea And Vomiting    In combination with colchicine  . Colchicine Diarrhea and Nausea And Vomiting    Pt states she had rnx to medication; severe V/D  . Compazine [Prochlorperazine Edisylate] Other (See Comments)    SEIZURE  . Other Other (See Comments)    Steroids cause uncontrollable coughing    Patient Measurements: Weight: 114 lb 9.6 oz (52 kg) Heparin Dosing Weight: 53.1 kg  Vital Signs: Temp: 98 F (36.7 C) (01/21 0727) Temp Source: Oral (01/21 0727) BP: 87/67 (01/21 0727) Pulse Rate: 55 (01/21 0727)  Labs: Recent Labs    03/04/19 1002 03/04/19 1002 03/04/19 1300 03/04/19 1752 03/04/19 2039 03/05/19 0452  HGB 10.9*   < >  --   --  10.3* 9.8*  HCT 33.4*  --   --   --  31.0* 29.7*  PLT 360  --   --   --  321 271  HEPARINUNFRC  --   --   --   --  0.41 0.35  CREATININE 1.21*  --   --   --   --  0.88  TROPONINIHS 17  --  16 18*  --   --    < > = values in this interval not displayed.    Estimated Creatinine Clearance: 58.6 mL/min (by C-G formula based on SCr of 0.88 mg/dL).   Medications:  Infusions:  . sodium chloride 10 mL/hr at 03/05/19 0100  . sodium chloride    . sodium chloride 1 mL/kg/hr (03/05/19 0546)  . heparin 700 Units/hr (03/04/19 1730)    Assessment: 59 year old female with hx of CABG in 2019 and a myocardial infarction in October 2020. She is admitted today with chest pain with radiation to arms. Plans noted for possible cath -heparin level at goal, hg= 9.8   Goal of Therapy:  Heparin level 0.3-0.7 units/ml Monitor platelets by anticoagulation protocol: Yes   Plan:  -Continue Heparin at 700 units/hr for now -Daily heparin level and CBC  Hildred Laser, PharmD Clinical Pharmacist **Pharmacist phone directory can now be found on [email or web address] ([email or web address]).  Listed under [place].

## 2019-03-05 NOTE — Interval H&P Note (Signed)
History and Physical Interval Note:  03/05/2019 4:23 PM  Gabrielle Page  has presented today for surgery, with the diagnosis of unstable angina.  The various methods of treatment have been discussed with the patient and family. After consideration of risks, benefits and other options for treatment, the patient has consented to  Procedure(s): LEFT HEART CATH AND CORS/GRAFTS ANGIOGRAPHY (N/A)  PERCUTANEOUS CORONARY INTERVENTION  as a surgical intervention.  The patient's history has been reviewed, patient examined, no change in status, stable for surgery.  I have reviewed the patient's chart and labs.  Questions were answered to the patient's satisfaction.    Cath Lab Visit (complete for each Cath Lab visit)  Clinical Evaluation Leading to the Procedure:   ACS: Yes.    Non-ACS:    Anginal Classification: CCS IV  Anti-ischemic medical therapy: Maximal Therapy (2 or more classes of medications)  Non-Invasive Test Results: No non-invasive testing performed  Prior CABG: Previous CABG  [name]

## 2019-03-05 NOTE — Progress Notes (Signed)
Received pt from Cathlab. Alert and oriented, R groin site CDI, level 0.

## 2019-03-06 DIAGNOSIS — I951 Orthostatic hypotension: Secondary | ICD-10-CM

## 2019-03-06 LAB — CBC
HCT: 27.8 % — ABNORMAL LOW (ref 36.0–46.0)
Hemoglobin: 9 g/dL — ABNORMAL LOW (ref 12.0–15.0)
MCH: 31.8 pg (ref 26.0–34.0)
MCHC: 32.4 g/dL (ref 30.0–36.0)
MCV: 98.2 fL (ref 80.0–100.0)
Platelets: 250 10*3/uL (ref 150–400)
RBC: 2.83 MIL/uL — ABNORMAL LOW (ref 3.87–5.11)
RDW: 14.4 % (ref 11.5–15.5)
WBC: 4.2 10*3/uL (ref 4.0–10.5)
nRBC: 0 % (ref 0.0–0.2)

## 2019-03-06 LAB — MRSA PCR SCREENING: MRSA by PCR: NEGATIVE

## 2019-03-06 MED ORDER — MIDODRINE HCL 5 MG PO TABS
5.0000 mg | ORAL_TABLET | Freq: Three times a day (TID) | ORAL | Status: DC
  Administered 2019-03-06 – 2019-03-07 (×3): 5 mg via ORAL
  Filled 2019-03-06 (×3): qty 1

## 2019-03-06 MED FILL — Verapamil HCl IV Soln 2.5 MG/ML: INTRAVENOUS | Qty: 2 | Status: AC

## 2019-03-06 NOTE — Progress Notes (Signed)
Progress Note  Patient Name: Rosilee Nauert Date of Encounter: 03/06/2019  Primary Cardiologist: Shirlee More, MD   Subjective   Only 1 episodes of chest pain last night, lasted about a hour. No SOB. Unexplained weight gain of 7 lbs this morning based on hospital scale.   Inpatient Medications    Scheduled Meds: . aspirin EC  81 mg Oral Daily  . atorvastatin  80 mg Oral q1800  . busPIRone  15 mg Oral TID  . clopidogrel  75 mg Oral Q breakfast  . feeding supplement (ENSURE ENLIVE)  237 mL Oral QHS  . heparin  5,000 Units Subcutaneous Q8H  . multivitamin with minerals  1 tablet Oral Daily  . pantoprazole  40 mg Oral BID  . ranolazine  1,000 mg Oral BID  . sertraline  25 mg Oral Daily  . sodium chloride flush  3 mL Intravenous Q12H  . sodium chloride flush  3 mL Intravenous Q12H  . traZODone  300 mg Oral QHS   Continuous Infusions: . sodium chloride 10 mL/hr at 03/05/19 0100  . sodium chloride     PRN Meds: sodium chloride, acetaminophen, ALPRAZolam, fentaNYL (SUBLIMAZE) injection, nitroGLYCERIN, ondansetron (ZOFRAN) IV, sodium chloride flush   Vital Signs    Vitals:   03/06/19 0447 03/06/19 0524 03/06/19 0805 03/06/19 0809  BP: (!) 86/52 (!) 88/52 (!) 74/47 (!) 90/52  Pulse: 64 62 65 67  Resp: 20  16   Temp: 98.7 F (37.1 C)  97.9 F (36.6 C)   TempSrc: Oral  Oral   SpO2: 95%  95%   Weight: 55.2 kg       Intake/Output Summary (Last 24 hours) at 03/06/2019 0857 Last data filed at 03/06/2019 0849 Gross per 24 hour  Intake 1481.45 ml  Output 1050 ml  Net 431.45 ml   Last 3 Weights 03/06/2019 03/05/2019 03/04/2019  Weight (lbs) 121 lb 9.6 oz 114 lb 9.6 oz 117 lb  Weight (kg) 55.157 kg 51.982 kg 53.071 kg      Telemetry    NSR without ventricular ectopy - Personally Reviewed  ECG    NSR with LBBB - Personally Reviewed  Physical Exam   GEN: No acute distress.   Neck: No JVD Cardiac: RRR, no murmurs, rubs, or gallops.  Respiratory: Clear to  auscultation bilaterally. GI: Soft, nontender, non-distended  MS: No edema; No deformity. Neuro:  Nonfocal  Psych: Normal affect   Labs    High Sensitivity Troponin:   Recent Labs  Lab 03/04/19 1002 03/04/19 1300 03/04/19 1752  TROPONINIHS 17 16 18*      Chemistry Recent Labs  Lab 03/04/19 1002 03/05/19 0452 03/05/19 1820  NA 138 139  --   K 2.7* 3.0*  --   CL 108 112*  --   CO2 21* 21*  --   GLUCOSE 90 95  --   BUN 23* 14  --   CREATININE 1.21* 0.88 0.80  CALCIUM 9.2 8.5*  --   PROT  --  5.4*  --   ALBUMIN  --  3.2*  --   AST  --  18  --   ALT  --  21  --   ALKPHOS  --  42  --   BILITOT  --  0.6  --   GFRNONAA 50* >60 >60  GFRAA 58* >60 >60  ANIONGAP 9 6  --      Hematology Recent Labs  Lab 03/05/19 0452 03/05/19 1820 03/06/19 0515  WBC 4.0 3.9*  4.2  RBC 3.11* 3.21* 2.83*  HGB 9.8* 10.1* 9.0*  HCT 29.7* 31.0* 27.8*  MCV 95.5 96.6 98.2  MCH 31.5 31.5 31.8  MCHC 33.0 32.6 32.4  RDW 13.9 14.2 14.4  PLT 271 284 250    BNPNo results for input(s): BNP, PROBNP in the last 168 hours.   DDimer No results for input(s): DDIMER in the last 168 hours.   Radiology    DG Chest 2 View  Result Date: 03/04/2019 CLINICAL DATA:  Chest pain and shortness of breath EXAM: CHEST - 2 VIEW COMPARISON:  November 13, 2018 FINDINGS: Lungs are clear. Heart is upper normal in size with pulmonary vascularity normal. Patient is status post coronary artery bypass grafting. No evident adenopathy. No pneumothorax. No bone lesions. IMPRESSION: Lungs clear. Heart upper normal in size. Status post coronary artery bypass grafting. No evident adenopathy. Electronically Signed   By: Lowella Grip III M.D.   On: 03/04/2019 10:33   CARDIAC CATHETERIZATION  Result Date: 03/05/2019  The left ventricular systolic function is normal. The left ventricular ejection fraction is 55-65% by visual estimate.  LV end diastolic pressure is normal. With significantly reduced/borderline hypotensive  systolic pressures (after sedation)  ------------ NATIVE CORONARY ANGIOGRAPHY -----------------  Dist LM lesion is 40% stenosed.  Ost LAD lesion is 80% stenosed. Mid LAD- stent is 80% stenosed. Marta Lamas 1st Diag lesion is 60% stenosed. Proximal-mid LAD-1 lesion is 60% stenosed.  1st Mrg lesion is 90% stenosed, just at the anastomosis site of the occluded SVG, but with recanalization of the distal vessel (noted previously been shut down).  -------------- GRAFTS ----------------  LIMA-dLAD graft was visualized by angiography and is normal in caliber. The graft exhibits no disease. The distal LAD is free of any significant disease. Retrograde flow which is the previously grafted diagonal  SVG-Diag is 100% stenosed at the aortic origin  SVG-OM graft was visualized by angiography and is 100% stenosed from the aortic origin to Insertion  SUMMARY  Severe two-vessel disease with ostial 80% LAD at the proximal edge of the stent then essentially sub-total occlusion (90%) after stent with patent LIMA to the LAD, recanalization of major OM branch that had previously been occluded during last PCI-persistent 90% stenosis at previous SVG anastomosis site.  Known occluded SVG-Diagl and SVG-OM  Widely patent RCA  Normal LV function and EDP -with borderline low systolic pressures RECOMMENDATIONS  The patient actually has relatively stable disease from the time of her last MI.  The OM branch has now recanalized with persistent lesion.  This was previously not able to be crossed with a wire leading to occlusion of the vessel downstream.  Given her current stable situation, I felt it prudent to hold off on attempting to rewire this vessel with the risk of a similar undesired outcome.  For now the recommendation would be to optimize medical management, if unable to control angina then last option will be to we tried to cross this lesion with the understanding that it could potentially end up causing the distal vessel to  occlude.  The patient is on 1000 mg twice daily Ranexa -> we will continue to titrate other antianginals as blood pressure tolerates. [name], MD   Cardiac Studies   Cath 03/05/2019  The left ventricular systolic function is normal. The left ventricular ejection fraction is 55-65% by visual estimate.  LV end diastolic pressure is normal. With significantly reduced/borderline hypotensive systolic pressures (after sedation)  ------------ NATIVE CORONARY ANGIOGRAPHY -----------------  Dist LM lesion  is 40% stenosed.  Ost LAD lesion is 80% stenosed. Mid LAD- stent is 80% stenosed. Marta Lamas 1st Diag lesion is 60% stenosed. Proximal-mid LAD-1 lesion is 60% stenosed.  1st Mrg lesion is 90% stenosed, just at the anastomosis site of the occluded SVG, but with recanalization of the distal vessel (noted previously been shut down).  -------------- GRAFTS ----------------  LIMA-dLAD graft was visualized by angiography and is normal in caliber. The graft exhibits no disease. The distal LAD is free of any significant disease. Retrograde flow which is the previously grafted diagonal  SVG-Diag is 100% stenosed at the aortic origin  SVG-OM graft was visualized by angiography and is 100% stenosed from the aortic origin to Insertion   SUMMARY  Severe two-vessel disease with ostial 80% LAD at the proximal edge of the stent then essentially sub-total occlusion (90%) after stent with patent LIMA to the LAD, recanalization of major OM branch that had previously been occluded during last PCI-persistent 90% stenosis at previous SVG anastomosis site.  Known occluded SVG-Diagl and SVG-OM  Widely patent RCA  Normal LV function and EDP -with borderline low systolic pressures   RECOMMENDATIONS  The patient actually has relatively stable disease from the time of her last MI.  The OM branch has now recanalized with persistent lesion.  This was previously not able to be crossed with a wire leading to  occlusion of the vessel downstream.  Given her current stable situation, I felt it prudent to hold off on attempting to rewire this vessel with the risk of a similar undesired outcome. ? For now the recommendation would be to optimize medical management, if unable to control angina then last option will be to we tried to cross this lesion with the understanding that it could potentially end up causing the distal vessel to occlude.  The patient is on 1000 mg twice daily Ranexa -> we will continue to titrate other antianginals as blood pressure tolerates.  Patient Profile     59 y.o. female with PMH of CAD, anemia and HLD presented with chest pain  Assessment & Plan    1. Unstable angina             - continue aspirin and plavix.              - most recent cath 11/24/2018 showed patent small LIMA-LAD, occluded SVG-OM1, and occluded SVG-D1. Native OM1 at 99% stenosis, PCI was unsuccessful.              - cath 03/05/2019 showed occluded SVG-Diag, occluded SVG-OM, patent RCA, patent LIMA-LAD, 90% OM lesion which was previous occluded. Medical therapy recommended for OM lesion  - ranolazine increased to 1000mg  BID. Metoprolol on hold due to low BP. Discussed with Dr. Debara Pickett who recommended increase coronary perfusion pressure by addition of midodrine 5mg  TID. May consider reintroduce metoprolol at a later time once BP improve  2. CAD s/p CABG 2019 (LIMA-AD, SVG-diagonal, SVG-OM)  3. Anemia: stable  4. HLD: continue lipitor.   5. Hypokalemia: repleted  6. Postop afib: no recurrence.   For questions or updates, please contact [place] Please consult [email or web address] for contact info under        Signed, Almyra Deforest, [place]  03/06/2019, 8:57 AM

## 2019-03-06 NOTE — Progress Notes (Signed)
Pt BP has been low at times, BP 82/54, HR 61, pt had pain meds earlier, MD notified, ordered to continue IV fluids, will continue to monitor, Thanks Arvella Nigh RN.

## 2019-03-07 DIAGNOSIS — D509 Iron deficiency anemia, unspecified: Secondary | ICD-10-CM

## 2019-03-07 HISTORY — DX: Iron deficiency anemia, unspecified: D50.9

## 2019-03-07 LAB — CBC
HCT: 27.6 % — ABNORMAL LOW (ref 36.0–46.0)
Hemoglobin: 9.1 g/dL — ABNORMAL LOW (ref 12.0–15.0)
MCH: 31.4 pg (ref 26.0–34.0)
MCHC: 33 g/dL (ref 30.0–36.0)
MCV: 95.2 fL (ref 80.0–100.0)
Platelets: 232 10*3/uL (ref 150–400)
RBC: 2.9 MIL/uL — ABNORMAL LOW (ref 3.87–5.11)
RDW: 14.4 % (ref 11.5–15.5)
WBC: 3.9 10*3/uL — ABNORMAL LOW (ref 4.0–10.5)
nRBC: 0 % (ref 0.0–0.2)

## 2019-03-07 MED ORDER — MIDODRINE HCL 5 MG PO TABS: 5.0000 mg | ORAL_TABLET | Freq: Two times a day (BID) | ORAL | 11 refills | Status: DC

## 2019-03-07 MED ORDER — RANOLAZINE ER 1000 MG PO TB12: 1000.0000 mg | ORAL_TABLET | Freq: Two times a day (BID) | ORAL | 3 refills | Status: DC

## 2019-03-07 NOTE — Discharge Summary (Addendum)
Discharge Summary    Patient ID: Gabrielle Page MRN: [id number]; DOB: 1961/02/10  Admit date: 03/04/2019 Discharge date: 03/07/2019  Primary Care Provider: Jaclynn Major, NP  Primary Cardiologist: Shirlee More, MD  Primary Electrophysiologist:  None   Discharge Diagnoses    Principal Problem:   Coronary artery disease involving native coronary artery of native heart with unstable angina pectoris [facility]) Active Problems:   Orthostatic hypotension   Hypokalemia   HLD (hyperlipidemia)   Iron deficiency anemia    Diagnostic Studies/Procedures    Cardiac catheterization 03/05/2019 LM distal 40 LAD ostial 80, proximal stent patent with 60 ISR, 80; D1 ostial 60 OM1 90 RCA patent SVG-D1 occluded SVG-OM1 occluded LIMA-LAD patent EF 55-65   _____________   History of Present Illness     Gabrielle Page is a 59 y.o. female with coronary artery disease status post CABG in [id number] that was complicated by postoperative atrial fibrillation and pericardial effusion.  She had an inferolateral MI in October 2020.  All vein grafts were occluded.  Large OM was occluded and PCI of this vessel was unsuccessful.  She presented to the hospital on 03/04/2019 with complaints of chest pain that awoke her from sleep.  Hospital Course     Consultants: None  She was admitted for further evaluation and management.  High-sensitivity troponins remained negative.  However, she continued to have chest discomfort and cardiac catheterization was arranged.  Cardiac catheterization demonstrated severe two-vessel disease with ostial 80% LAD stenosis of the proximal edge of the stent and then subtotal occlusion after the stent with a patent LIMA-LAD.  There was noted recanalization of the major OM branch that had previously been occluded during last PCI with persistent 90% stenosis.  The vein graft to the diagonal and vein graft to obtuse marginal are known to be occluded and the RCA was patent.   Overall, anatomy was stable and medical therapy was recommended.  Due to relative hypotension, medical management of her angina was difficult.  She was not able to tolerate beta-blocker and this was discontinued.  She was placed on ranolazine.  Is felt that her angina may be exacerbated by low coronary perfusion pressure.  Therefore, she was placed on midodrine.  She was evaluated by Dr. Oval Linsey this morning and was noted to be chest pain-free.  She was overall stable and felt to be ready for discharge to home.  It was felt that she would continue on Midodrine twice daily.  Of note, hemoglobin remained stable this admission.  Did the patient have an acute coronary syndrome (MI, NSTEMI, STEMI, etc) this admission?:  No                               Did the patient have a percutaneous coronary intervention (stent / angioplasty)?:  No.   _____________  Discharge Vitals Blood pressure 137/68, pulse 64, temperature (!) 97.4 F (36.3 C), temperature source Oral, resp. rate 20, weight 55.4 kg, SpO2 99 %.  Filed Weights   03/05/19 0409 03/06/19 0447 03/07/19 0441  Weight: 52 kg 55.2 kg 55.4 kg    Labs & Radiologic Studies    CBC Recent Labs    03/06/19 0515 03/07/19 0530  WBC 4.2 3.9*  HGB 9.0* 9.1*  HCT 27.8* 27.6*  MCV 98.2 95.2  PLT 250 [id number]   Basic Metabolic Panel Recent Labs    03/05/19 0452 03/05/19 1820  NA 139  --   K 3.0*  --  CL 112*  --   CO2 21*  --   GLUCOSE 95  --   BUN 14  --   CREATININE 0.88 0.80  CALCIUM 8.5*  --   MG 1.9  --    Liver Function Tests Recent Labs    03/05/19 0452  AST 18  ALT 21  ALKPHOS 42  BILITOT 0.6  PROT 5.4*  ALBUMIN 3.2*   High Sensitivity Troponin:   Recent Labs  Lab 03/04/19 1002 03/04/19 1300 03/04/19 1752  TROPONINIHS 17 16 18*    Hemoglobin A1C Recent Labs    03/04/19 1752  HGBA1C 5.4   Fasting Lipid Panel Recent Labs    03/05/19 0452  CHOL 133  HDL 30*  LDLCALC 64  TRIG 194*  CHOLHDL 4.4   Thyroid  Function Tests Recent Labs    03/04/19 1752  TSH 1.806   _____________  DG Chest 2 View Result Date: 03/04/2019 IMPRESSION: Lungs clear. Heart upper normal in size. Status post coronary artery bypass grafting. No evident adenopathy. Electronically Signed   By: Lowella Grip III M.D.   On: 03/04/2019 10:33     Disposition   Pt is being discharged home today in good condition.  Follow-up Plans & Appointments     Discharge Instructions    Diet - low sodium heart healthy   Complete by: As directed    Discharge wound care:   Complete by: As directed    Call for any swelling, bleeding, bruising of the catheterization site.   Driving Restrictions   Complete by: As directed    No driving for 3 days   Increase activity slowly   Complete by: As directed    Lifting restrictions   Complete by: As directed    No lifting over 5 lbs for 1 week      Discharge Medications   Allergies as of 03/07/2019      Reactions   Amiodarone Diarrhea, Nausea And Vomiting   In combination with colchicine   Colchicine Diarrhea, Nausea And Vomiting   Pt states she had rnx to medication; severe V/D   Compazine [prochlorperazine Edisylate] Other (See Comments)   SEIZURE   Other Other (See Comments)   Steroids cause uncontrollable coughing      Medication List    STOP taking these medications   furosemide 40 MG tablet Commonly known as: LASIX   metoprolol tartrate 25 MG tablet Commonly known as: LOPRESSOR     TAKE these medications   albuterol 108 (90 Base) MCG/ACT inhaler Commonly known as: VENTOLIN HFA Inhale 2 puffs into the lungs every 6 (six) hours as needed for wheezing or shortness of breath.   aspirin EC 81 MG tablet Take 81 mg by mouth daily.   atorvastatin 80 MG tablet Commonly known as: LIPITOR Take 1 tablet (80 mg total) by mouth daily at 6 PM.   busPIRone 15 MG tablet Commonly known as: BUSPAR Take 15 mg by mouth 3 (three) times daily.   clopidogrel 75 MG  tablet Commonly known as: PLAVIX Take 1 tablet (75 mg total) by mouth daily with breakfast.   icosapent Ethyl 1 g capsule Commonly known as: VASCEPA Take 2 capsules (2 g total) by mouth 2 (two) times daily.   midodrine 5 MG tablet Commonly known as: PROAMATINE Take 1 tablet (5 mg total) by mouth 2 (two) times daily with a meal.   multivitamin with minerals Tabs tablet Take 1 tablet by mouth daily.   nitroGLYCERIN 0.4 MG SL tablet  Commonly known as: NITROSTAT Place 0.4 mg under the tongue every 5 (five) minutes as needed for chest pain.   pantoprazole 40 MG tablet Commonly known as: PROTONIX Take 40 mg by mouth daily.   ranolazine 1000 MG SR tablet Commonly known as: Ranexa Take 1 tablet (1,000 mg total) by mouth 2 (two) times daily. What changed:   medication strength  how much to take   sertraline 25 MG tablet Commonly known as: ZOLOFT Take 25 mg by mouth daily.   traZODone 150 MG tablet Commonly known as: DESYREL Take 300 mg by mouth at bedtime.   Tylenol 325 MG tablet Generic drug: acetaminophen Take 325 mg by mouth every 6 (six) hours as needed for moderate pain or headache.            Discharge Care Instructions  (From admission, onward)         Start     Ordered   03/07/19 0000  Discharge wound care:    Comments: Call for any swelling, bleeding, bruising of the catheterization site.   03/07/19 1312             Outstanding Labs/Studies   None  Duration of Discharge Encounter   Greater than 30 minutes including physician time.  Signed, Richardson Dopp, PA-C 03/07/2019, 1:15 PM   Patient seen and examined.  Plan as discussed in my rounding note for today and outlined above.   Sidhant Helderman C. Oval Linsey, MD, [facility]  03/07/2019   1:55 PM

## 2019-03-07 NOTE — Care Management (Signed)
Spoke w patient about her Rxs. She states that she was on Ranexa PTA and dose was increased. She has no problems paying for Rx through [facility]. NO CM needs identified at DC.

## 2019-03-07 NOTE — Progress Notes (Signed)
Nsg Discharge Note  Admit Date:  03/04/2019 Discharge date: 03/07/2019   Hughie Closs to be D/C'd home per MD order.  AVS completed. Patient/caregiver able to verbalize understanding.  Discharge Medication: Allergies as of 03/07/2019      Reactions   Amiodarone Diarrhea, Nausea And Vomiting   In combination with colchicine   Colchicine Diarrhea, Nausea And Vomiting   Pt states she had rnx to medication; severe V/D   Compazine [prochlorperazine Edisylate] Other (See Comments)   SEIZURE   Other Other (See Comments)   Steroids cause uncontrollable coughing      Medication List    STOP taking these medications   furosemide 40 MG tablet Commonly known as: LASIX   metoprolol tartrate 25 MG tablet Commonly known as: LOPRESSOR     TAKE these medications   albuterol 108 (90 Base) MCG/ACT inhaler Commonly known as: VENTOLIN HFA Inhale 2 puffs into the lungs every 6 (six) hours as needed for wheezing or shortness of breath.   aspirin EC 81 MG tablet Take 81 mg by mouth daily.   atorvastatin 80 MG tablet Commonly known as: LIPITOR Take 1 tablet (80 mg total) by mouth daily at 6 PM.   busPIRone 15 MG tablet Commonly known as: BUSPAR Take 15 mg by mouth 3 (three) times daily.   clopidogrel 75 MG tablet Commonly known as: PLAVIX Take 1 tablet (75 mg total) by mouth daily with breakfast.   icosapent Ethyl 1 g capsule Commonly known as: VASCEPA Take 2 capsules (2 g total) by mouth 2 (two) times daily.   midodrine 5 MG tablet Commonly known as: PROAMATINE Take 1 tablet (5 mg total) by mouth 2 (two) times daily with a meal.   multivitamin with minerals Tabs tablet Take 1 tablet by mouth daily.   nitroGLYCERIN 0.4 MG SL tablet Commonly known as: NITROSTAT Place 0.4 mg under the tongue every 5 (five) minutes as needed for chest pain.   pantoprazole 40 MG tablet Commonly known as: PROTONIX Take 40 mg by mouth daily.   ranolazine 1000 MG SR tablet Commonly known as:  Ranexa Take 1 tablet (1,000 mg total) by mouth 2 (two) times daily. What changed:   medication strength  how much to take   sertraline 25 MG tablet Commonly known as: ZOLOFT Take 25 mg by mouth daily.   traZODone 150 MG tablet Commonly known as: DESYREL Take 300 mg by mouth at bedtime.   Tylenol 325 MG tablet Generic drug: acetaminophen Take 325 mg by mouth every 6 (six) hours as needed for moderate pain or headache.            Discharge Care Instructions  (From admission, onward)         Start     Ordered   03/07/19 0000  Discharge wound care:    Comments: Call for any swelling, bleeding, bruising of the catheterization site.   03/07/19 1312          Discharge Assessment: Vitals:   03/07/19 0826 03/07/19 1113  BP: 99/62 137/68  Pulse: 62 64  Resp: 18 20  Temp: 97.7 F (36.5 C) (!) 97.4 F (36.3 C)  SpO2: 97% 99%   Skin clean, dry and intact without evidence of skin break down, no evidence of skin tears noted. IV catheter discontinued intact. Site without signs and symptoms of complications - no redness or edema noted at insertion site, patient denies c/o pain - only slight tenderness at site.  Dressing with slight pressure applied.  D/c Instructions-Education: Discharge instructions given to patient/family with verbalized understanding. D/c education completed with patient/family including follow up instructions, medication list, d/c activities limitations if indicated, with other d/c instructions as indicated by MD - patient able to verbalize understanding, all questions fully answered. Patient instructed to return to ED, call 911, or call MD for any changes in condition.  Patient escorted via [place], and D/C home via private auto.  Atilano Ina, RN 03/07/2019 1:27 PM

## 2019-03-07 NOTE — Discharge Instructions (Signed)
Angina  Angina is very bad discomfort or pain in the chest, neck, arm, jaw, or back. The discomfort is caused by a lack of blood in the middle layer of the heart wall (myocardium). What are the causes? This condition is caused by a buildup of fat and cholesterol (plaque) in your arteries (atherosclerosis). This buildup narrows the arteries and makes it hard for blood to flow. What increases the risk? You are more likely to develop this condition if:  You have high levels of cholesterol in your blood.  You have high blood pressure (hypertension).  You have diabetes.  You have a family history of heart disease.  You are not active, or you do not exercise enough.  You feel sad (depressed).  You have been treated with high energy rays (radiation) on the left side of your chest. Other risk factors are:  Using tobacco.  Being very overweight (obese).  Eating a diet high in unhealthy fats (saturated fats).  Having stress, or being exposed to things that cause stress.  Using drugs, such as cocaine. Women have a greater risk for angina if:  They are older than 55.  They have stopped having their period (are in postmenopause). What are the signs or symptoms? Common symptoms of this condition in both men and women may include:  Chest pain, which may: ? Feel like a crushing or squeezing in the chest. ? Feel like a tightness, pressure, fullness, or heaviness in the chest. ? Last for more than a few minutes at a time. ? Stop and come back (recur) after a few minutes.  Pain in the neck, arm, jaw, or back.  Heartburn or upset stomach (indigestion) for no reason.  Being short of breath.  Feeling sick to your stomach (nauseous).  Sudden cold sweats. Women and people with diabetes may have other symptoms that are not usual, such as feeling:  Tired (fatigue).  Worried or nervous (anxious) for no reason.  Weak for no reason.  Dizzy or passing out (fainting). How is this  treated? This condition may be treated with:  Medicines. These are given to: ? Prevent blood clots. ? Prevent heart attack. ? Relax blood vessels and improve blood flow to the heart (nitrates). ? Reduce blood pressure. ? Improve the pumping action of the heart. ? Reduce fat and cholesterol in the blood.  A procedure to widen a narrowed or blocked artery in the heart (angioplasty).  Surgery to allow blood to go around a blocked artery (coronary artery bypass surgery). Follow these instructions at home: Medicines  Take over-the-counter and prescription medicines only as told by your doctor.  Do not take these medicines unless your doctor says that you can: ? NSAIDs. These include:  Ibuprofen.  Naproxen. ? Vitamin supplements that have vitamin A, vitamin E, or both. ? Hormone therapy that contains estrogen with or without progestin. Eating and drinking   Eat a heart-healthy diet that includes: ? Lots of fresh fruits and vegetables. ? Whole grains. ? Low-fat (lean) protein. ? Low-fat dairy products.  Follow instructions from your doctor about what you cannot eat or drink. Activity  Follow an exercise program that your doctor tells you.  Talk with your doctor about joining a program to help improve the health of your heart (cardiac rehab).  When you feel tired, take a break. Plan breaks if you know you are going to feel tired. Lifestyle   Do not use any products that contain nicotine or tobacco. This includes cigarettes, e-cigarettes, and   chewing tobacco. If you need help quitting, ask your doctor.  If your doctor says you can drink alcohol: ? Limit how much you use to:  0-1 drink a day for women who are not pregnant.  0-2 drinks a day for men. ? Be aware of how much alcohol is in your drink. In the [place] one drink equals:  One 12 oz bottle of beer (355 mL).  One 5 oz glass of wine (148 mL).  One 1 oz glass of hard liquor (44 mL). General instructions  Stay  at a healthy weight. If your doctor tells you to do so, work with him or her to lose weight.  Learn to deal with stress. If you need help, ask your doctor.  Keep your vaccines up to date. Get a flu shot every year.  Talk with your doctor if you feel sad. Take a screening test to see if you are at risk for depression.  Work with your doctor to manage any other health problems that you have. These may include diabetes or high blood pressure.  Keep all follow-up visits as told by your doctor. This is important. Get help right away if:  You have pain in your chest, neck, arm, jaw, or back, and the pain: ? Lasts more than a few minutes. ? Comes back. ? Does not get better after you take medicine under your tongue (sublingual nitroglycerin). ? Keeps getting worse. ? Comes more often.  You have any of these problems for no reason: ? Sweating a lot. ? Heartburn or upset stomach. ? Shortness of breath. ? Trouble breathing. ? Feeling sick to your stomach. ? Throwing up (vomiting). ? Feeling more tired than normal. ? Feeling nervous or worrying more than normal. ? Weakness.  You are suddenly dizzy or light-headed.  You pass out. These symptoms may be an emergency. Do not wait to see if the symptoms will go away. Get medical help right away. Call your local emergency services (911 in the [place] Do not drive yourself to the hospital. Summary  Angina is very bad discomfort or pain in the chest, neck, arm, neck, or back.  Symptoms include chest pain, heartburn or upset stomach for no reason, and shortness of breath.  Women or people with diabetes may have symptoms that are not usual, such as feeling nervous or worried for no reason, weak for no reason, or tired.  Take all medicines only as told by your doctor.  You should eat a heart-healthy diet and follow an exercise program. This information is not intended to replace advice given to you by your health care provider. Make sure you  discuss any questions you have with your health care provider. Document Revised: 09/16/2017 Document Reviewed: 09/16/2017 Elsevier Patient Education  2020 [employer]  

## 2019-03-07 NOTE — Progress Notes (Signed)
Progress Note  Patient Name: Gabrielle Page Date of Encounter: 03/07/2019  Primary Cardiologist: Shirlee More, MD   Subjective   Feeling well.  She reports having no chest pain for the first time in 2 months.  She feels that midodrine is helping.  Inpatient Medications    Scheduled Meds: . aspirin EC  81 mg Oral Daily  . atorvastatin  80 mg Oral q1800  . busPIRone  15 mg Oral TID  . clopidogrel  75 mg Oral Q breakfast  . feeding supplement (ENSURE ENLIVE)  237 mL Oral QHS  . heparin  5,000 Units Subcutaneous Q8H  . midodrine  5 mg Oral TID WC  . multivitamin with minerals  1 tablet Oral Daily  . pantoprazole  40 mg Oral BID  . ranolazine  1,000 mg Oral BID  . sertraline  25 mg Oral Daily  . sodium chloride flush  3 mL Intravenous Q12H  . sodium chloride flush  3 mL Intravenous Q12H  . traZODone  300 mg Oral QHS   Continuous Infusions: . sodium chloride 10 mL/hr at 03/05/19 0100  . sodium chloride     PRN Meds: sodium chloride, acetaminophen, ALPRAZolam, fentaNYL (SUBLIMAZE) injection, nitroGLYCERIN, ondansetron (ZOFRAN) IV, sodium chloride flush   Vital Signs    Vitals:   03/06/19 2223 03/07/19 0110 03/07/19 0441 03/07/19 0826  BP: (!) 105/59 101/65 95/76 99/62   Pulse: 68 65 65 62  Resp: 20 20 20 18   Temp: 97.8 F (36.6 C)  98.7 F (37.1 C) 97.7 F (36.5 C)  TempSrc: Oral  Oral Oral  SpO2: 100% 95%  97%  Weight:   55.4 kg     Intake/Output Summary (Last 24 hours) at 03/07/2019 1104 Last data filed at 03/07/2019 0914 Gross per 24 hour  Intake 678 ml  Output 1100 ml  Net -422 ml   Last 3 Weights 03/07/2019 03/06/2019 03/05/2019  Weight (lbs) 122 lb 1.6 oz 121 lb 9.6 oz 114 lb 9.6 oz  Weight (kg) 55.384 kg 55.157 kg 51.982 kg      Telemetry    Sinus rhythm.  No events- Personally Reviewed  ECG    Sinus rhythm.  Rate 61 bpm.  Left bundle branch block. - Personally Reviewed  Physical Exam   VS:  BP 99/62 (BP Location: Right Arm)   Pulse 62    Temp 97.7 F (36.5 C) (Oral)   Resp 18   Wt 55.4 kg   SpO2 97%   BMI 21.63 kg/m  , BMI Body mass index is 21.63 kg/m. GENERAL:  Well appearing HEENT: Pupils equal round and reactive, fundi not visualized, oral mucosa unremarkable NECK:  No jugular venous distention, waveform within normal limits, carotid upstroke brisk and symmetric, no bruits LUNGS:  Clear to auscultation bilaterally HEART:  RRR.  PMI not displaced or sustained,S1 and S2 within normal limits, no S3, no S4, no clicks, no rubs, II/VI systolic murmur anteriorly ABD:  Flat, positive bowel sounds normal in frequency in pitch, no bruits, no rebound, no guarding, no midline pulsatile mass, no hepatomegaly, no splenomegaly EXT:  2 plus pulses throughout, no edema, no cyanosis no clubbing.  Right groin without ecchymosis, hematoma, or bruit SKIN:  No rashes no nodules NEURO:  Cranial nerves II through XII grossly intact, motor grossly intact throughout PSYCH:  Cognitively intact, oriented to person place and time   Labs    High Sensitivity Troponin:   Recent Labs  Lab 03/04/19 1002 03/04/19 1300 03/04/19 1752  TROPONINIHS 17  16 18*      Chemistry Recent Labs  Lab 03/04/19 1002 03/05/19 0452 03/05/19 1820  NA 138 139  --   K 2.7* 3.0*  --   CL 108 112*  --   CO2 21* 21*  --   GLUCOSE 90 95  --   BUN 23* 14  --   CREATININE 1.21* 0.88 0.80  CALCIUM 9.2 8.5*  --   PROT  --  5.4*  --   ALBUMIN  --  3.2*  --   AST  --  18  --   ALT  --  21  --   ALKPHOS  --  42  --   BILITOT  --  0.6  --   GFRNONAA 50* >60 >60  GFRAA 58* >60 >60  ANIONGAP 9 6  --      Hematology Recent Labs  Lab 03/05/19 1820 03/06/19 0515 03/07/19 0530  WBC 3.9* 4.2 3.9*  RBC 3.21* 2.83* 2.90*  HGB 10.1* 9.0* 9.1*  HCT 31.0* 27.8* 27.6*  MCV 96.6 98.2 95.2  MCH 31.5 31.8 31.4  MCHC 32.6 32.4 33.0  RDW 14.2 14.4 14.4  PLT 284 250 232    BNPNo results for input(s): BNP, PROBNP in the last 168 hours.   DDimer No results for  input(s): DDIMER in the last 168 hours.   Radiology    CARDIAC CATHETERIZATION  Result Date: 03/05/2019  The left ventricular systolic function is normal. The left ventricular ejection fraction is 55-65% by visual estimate.  LV end diastolic pressure is normal. With significantly reduced/borderline hypotensive systolic pressures (after sedation)  ------------ NATIVE CORONARY ANGIOGRAPHY -----------------  Dist LM lesion is 40% stenosed.  Ost LAD lesion is 80% stenosed. Mid LAD- stent is 80% stenosed. Marta Lamas 1st Diag lesion is 60% stenosed. Proximal-mid LAD-1 lesion is 60% stenosed.  1st Mrg lesion is 90% stenosed, just at the anastomosis site of the occluded SVG, but with recanalization of the distal vessel (noted previously been shut down).  -------------- GRAFTS ----------------  LIMA-dLAD graft was visualized by angiography and is normal in caliber. The graft exhibits no disease. The distal LAD is free of any significant disease. Retrograde flow which is the previously grafted diagonal  SVG-Diag is 100% stenosed at the aortic origin  SVG-OM graft was visualized by angiography and is 100% stenosed from the aortic origin to Insertion  SUMMARY  Severe two-vessel disease with ostial 80% LAD at the proximal edge of the stent then essentially sub-total occlusion (90%) after stent with patent LIMA to the LAD, recanalization of major OM branch that had previously been occluded during last PCI-persistent 90% stenosis at previous SVG anastomosis site.  Known occluded SVG-Diagl and SVG-OM  Widely patent RCA  Normal LV function and EDP -with borderline low systolic pressures RECOMMENDATIONS  The patient actually has relatively stable disease from the time of her last MI.  The OM branch has now recanalized with persistent lesion.  This was previously not able to be crossed with a wire leading to occlusion of the vessel downstream.  Given her current stable situation, I felt it prudent to hold off on  attempting to rewire this vessel with the risk of a similar undesired outcome.  For now the recommendation would be to optimize medical management, if unable to control angina then last option will be to we tried to cross this lesion with the understanding that it could potentially end up causing the distal vessel to occlude.  The patient is on  1000 mg twice daily Ranexa -> we will continue to titrate other antianginals as blood pressure tolerates. [name], MD   Cardiac Studies   Echo 11/25/2018: 1. Left ventricular ejection fraction, by visual estimation, is 55 to 60%. The left ventricle has normal function. Normal left ventricular size. There is mildly increased left ventricular hypertrophy.  2. Elevated mean left atrial pressure.  3. Left ventricular diastolic Doppler parameters are consistent with impaired relaxation pattern of LV diastolic filling.  4. Global right ventricle has mildly reduced systolic function.The right ventricular size is normal. No increase in right ventricular wall thickness.  5. Left atrial size was mildly dilated.  6. Right atrial size was normal.  7. The mitral valve is abnormal. Mild to moderate mitral valve regurgitation.  8. The tricuspid valve is abnormal. Tricuspid valve regurgitation mild-moderate.  9. Normal pulmonary artery systolic pressure. RVSP 28 mmHg 10. The inferior vena cava is normal in size with <50% respiratory variability, suggesting right atrial pressure of 8 mmHg. 11. Abnormal septal motion consistent with left bundle branch block.  LHC 03/06/19:  The left ventricular systolic function is normal. The left ventricular ejection fraction is 55-65% by visual estimate.  LV end diastolic pressure is normal. With significantly reduced/borderline hypotensive systolic pressures (after sedation)  ------------ NATIVE CORONARY ANGIOGRAPHY -----------------  Dist LM lesion is 40% stenosed.  Ost LAD lesion is 80% stenosed. Mid LAD- stent is 80%  stenosed. Marta Lamas 1st Diag lesion is 60% stenosed. Proximal-mid LAD-1 lesion is 60% stenosed.  1st Mrg lesion is 90% stenosed, just at the anastomosis site of the occluded SVG, but with recanalization of the distal vessel (noted previously been shut down).  -------------- GRAFTS ----------------  LIMA-dLAD graft was visualized by angiography and is normal in caliber. The graft exhibits no disease. The distal LAD is free of any significant disease. Retrograde flow which is the previously grafted diagonal  SVG-Diag is 100% stenosed at the aortic origin  SVG-OM graft was visualized by angiography and is 100% stenosed from the aortic origin to Insertion   SUMMARY  Severe two-vessel disease with ostial 80% LAD at the proximal edge of the stent then essentially sub-total occlusion (90%) after stent with patent LIMA to the LAD, recanalization of major OM branch that had previously been occluded during last PCI-persistent 90% stenosis at previous SVG anastomosis site.  Known occluded SVG-Diagl and SVG-OM  Widely patent RCA  Normal LV function and EDP -with borderline low systolic pressures   Patient Profile     59 y.o. female with CAD status post CABG, hyperlipidemia, and chronic anemia admitted with unstable angina.  Assessment & Plan    # CAD s/p CABG:  # Unstable angina: # Hyperlipidemia: She was found to have severe two-vessel disease at cath with 80% ostial LAD and subtotal occlusion after her stent.  LIMA to LAD was patent.  The previously occluded OM branch had recanalized.  Overall cath showed stable disease.  Continue Ranexa and atorvastatin.  Blood pressure has been too low for other antianginals.  She was started on midodrine and is feeling better than she has in months.  This seems to have improved her perfusion pressure.  She was able to ambulate without any difficulty.  # Chronic anemia: Hemoglobin stable this admission.  Continue aspirin and clopidogrel.  #  Post-operative atrial fibrillation: No known recurrence.  # Dispo: Home today  For questions or updates, please contact [place] Please consult [email or web address] for contact info under  Signed, Skeet Latch, MD  03/07/2019, 11:04 AM

## 2019-03-18 ENCOUNTER — Other Ambulatory Visit: Payer: Self-pay | Admitting: Family

## 2019-03-18 MED FILL — PANTOPRAZOLE SOD DR 40 MG T: 40 | 10 days supply | Qty: 20 | Fill #1

## 2019-03-18 NOTE — Progress Notes (Signed)
Cardiology Office Note:    Date:  03/19/2019   ID:  Gabrielle Page, DOB May 20, 1960, MRN [id number]  PCP:  Jaclynn Major, NP  Cardiologist:  Shirlee More, MD    Referring MD: Jaclynn Major, NP    ASSESSMENT:    1. Fall, subsequent encounter   2. Palpitations   3. Coronary artery disease of bypass graft of native heart with stable angina pectoris ([place])   4. Iron deficiency anemia secondary to inadequate dietary iron intake   5. Mixed hyperlipidemia    PLAN:    In order of problems listed above:  1. Primary problem is fall trauma concerned a rib fracture pulmonary contusion ankle fracture x-rays ordered the history.  If the ankle is fractured I will need to refer to orthopedics 2. STable CAD recent coronary angiography her blood pressure will tolerate resuming her beta-blocker.  In view of the event that occurred we will do a 1 week CO monitor to screen for arrhythmia 3. Stable recent labs continue oral iron 4. Marked improvement in lipids continue EPA and high intensity statin   Next appointment: 3 Weeks   Medication Adjustments/Labs and Tests Ordered: Current medicines are reviewed at length with the patient today.  Concerns regarding medicines are outlined above.  Orders Placed This Encounter  Procedures  . DG Chest 2 View  . DG Ankle 2 Views Right  . LONG TERM MONITOR (3-14 DAYS)   Meds ordered this encounter  Medications  . metoprolol tartrate (LOPRESSOR) 25 MG tablet    Sig: Take 1 tablet (25 mg total) by mouth 2 (two) times daily.    Dispense:  180 tablet    Refill:  3    Chief Complaint  Patient presents with  . Follow-up    After recent admission normal troponin chest pain repeat coronary angiography and optimization of medical therapy for CAD    History of Present Illness:    Gabrielle Page is a 59 y.o. female with a hx of CAD (NSTEMI PCI/DES of LAD 10/2016, CABG 03/26/17 with LIMA to LAD, SVG to Diag, and SVG to OM), postoperative  PAF/pericardial effusion, HLD, LBBB, diastolic dysfunction  last seen 12/03/2018.She underwent cardiac catheterization 11/24/18 and had a periprocedure Type 4 MI- "occluded occlusion of native OM1 at anastomosis site. Attempted PTCA of 99% lesion, unable to advance, with wire removal there was acute occlusion of downstream OM", occlused SVG-OM and SVG-diagonal, small LIMA-LAD. No evidence of CHF.    She was last seen 01/29/2019.  At that time she was iron deficient on oral iron of unimproved should require up parenteral IV replacement. Compliance with diet, lifestyle and medications: Yes  She is readmitted to the hospital 03/04/2019 to 03/07/2019 for chest pain her high-sensitivity troponins were normal.  Because of ongoing chest pain in hospital she underwent repeat coronary angiography.  Major marginal branch that had been occluded during the last PCI attempt was now patent with a 90% stenosis and was felt to be best treated medically.  Was difficult to treat angina because of hypotension in hospital she was unable to tolerate beta-blockers and was placed on ranolazine.  To increase react perfusion she was placed on alpha agonist midodrine.  Responded to treatment was discharged on intensified medical therapy.  Lab 03/04/19 1002 03/04/19 1300 03/04/19 1752  TROPONINIHS 17 16 18*    Cardiac catheterization 03/05/2019 LM distal 40 LAD ostial 80, proximal stent patent with 60 ISR, 80; D1 ostial 60 OM1 90 RCA patent SVG-D1 occluded SVG-OM1 occluded LIMA-LAD patent  EF 55-65  In hospital hemoglobin varied between 10.1 and 9.1 at discharge.  Lipid profile admission to the hospital showed a cholesterol 133 HDL of 30 LDL 64.  The nature of this visit quickly changed.  When she first came over the hospital she said she felt very good off her beta-blocker she is aware of her heart beating had a fall at home and complains bitterly of right-sided chest pain wonders if she fractured ribs she is short  of breath when she takes of breath and but has not had hemoptysis and has trouble walking because of right ankle swelling and pain.  She avoided EDs because of COVID-19 request evaluation she has no findings of hemopneumothorax but she will have a chest x-ray right rib x-rays for fracture and x-ray of the right ankle.  For pain I told her she could use Aleve as well as over-the-counter Tylenol.  If she has fractures we will need to refer her to either primary care or orthopedic surgery.  Resting heart rate today 89 bpm I asked her to go back on her previous beta-blocker dosage with a blood pressure of 158/90 on her alpha-blocker ZIO monitor see her back in the office in a few weeks she will need to hold on her cardiac rehabilitation.  Past Medical History:  Diagnosis Date  . Asthma   . Chronic bronchitis ([place])    "usually get it q yr" (11/05/2016)  . Coronary artery disease   . GERD (gastroesophageal reflux disease)   . Headache    "weekly" (11/05/2016)  . Heart murmur    "dx'd when I was a child"  . Hepatitis 1991   "when I had my gallbladder out" (11/05/2016)  . History of blood transfusion 1985   S/P childbirth  . History of kidney stones   . HLD (hyperlipidemia) 11/27/2018  . NSTEMI (non-ST elevated myocardial infarction) ([place]) 11/05/2016   9/18 PCI/DESx1 to p/mLAD  . Pancreatitis 1991  . Pneumonia    "twice" (11/05/2016)    Past Surgical History:  Procedure Laterality Date  . ABDOMINAL HYSTERECTOMY  1990  . APPENDECTOMY  1992  . BILE DUCT EXPLORATION  1991   "reconstruction"  . CESAREAN SECTION  1985  . CHOLECYSTECTOMY OPEN  1991  . CORONARY ANGIOPLASTY WITH STENT PLACEMENT  11/05/2016  . CORONARY ARTERY BYPASS GRAFT N/A 03/26/2017   Procedure: CORONARY ARTERY BYPASS GRAFTING (CABG) x 3 WITH ENDOSCOPIC HARVESTING OF RIGHT SAPHENOUS VEIN;  Surgeon: Ivin Poot, MD;  Location: [facility];  Service: Open Heart Surgery;  Laterality: N/A;  . CORONARY STENT INTERVENTION N/A 11/05/2016     Procedure: CORONARY STENT INTERVENTION;  Surgeon: Martinique, Peter M, MD;  Location: [facility];  Service: Cardiovascular;  Laterality: N/A;  . CYSTOSCOPY WITH STENT PLACEMENT  "3 times" (11/05/2016)  . DILATION AND CURETTAGE OF UTERUS  1989  . INTRAVASCULAR ULTRASOUND/IVUS N/A 03/22/2017   Procedure: Intravascular Ultrasound/IVUS;  Surgeon: Martinique, Peter M, MD;  Location: [facility];  Service: Cardiovascular;  Laterality: N/A;  . KNEE ARTHROSCOPY Right 2000s  . LEFT HEART CATH AND CORONARY ANGIOGRAPHY N/A 11/05/2016   Procedure: LEFT HEART CATH AND CORONARY ANGIOGRAPHY;  Surgeon: Martinique, Peter M, MD;  Location: [facility];  Service: Cardiovascular;  Laterality: N/A;  . LEFT HEART CATH AND CORONARY ANGIOGRAPHY N/A 03/22/2017   Procedure: LEFT HEART CATH AND CORONARY ANGIOGRAPHY;  Surgeon: Martinique, Peter M, MD;  Location: [facility];  Service: Cardiovascular;  Laterality: N/A;  .  LEFT HEART CATH AND CORS/GRAFTS ANGIOGRAPHY N/A 03/05/2019   Procedure: LEFT HEART CATH AND CORS/GRAFTS ANGIOGRAPHY;  Surgeon: Leonie Man, MD;  Location: [facility];  Service: Cardiovascular;  Laterality: N/A;  . NASAL SINUS SURGERY  ~ 2009   "exposed to mildew; had to have the infectoin drained; cut the outside of my face"  . OVARIAN CYST SURGERY  X 6  . RIGHT/LEFT HEART CATH AND CORONARY/GRAFT ANGIOGRAPHY N/A 11/24/2018   Procedure: RIGHT/LEFT HEART CATH AND CORONARY/GRAFT ANGIOGRAPHY;  Surgeon: Leonie Man, MD;  Location: [facility];  Service: Cardiovascular;  Laterality: N/A;  . TEE WITHOUT CARDIOVERSION N/A 03/26/2017   Procedure: TRANSESOPHAGEAL ECHOCARDIOGRAM (TEE);  Surgeon: Prescott Gum, [name], MD;  Location: [facility];  Service: Open Heart Surgery;  Laterality: N/A;    Current Medications: Current Meds  Medication Sig  . acetaminophen (TYLENOL) 325 MG tablet Take 325 mg by mouth every 6 (six) hours as needed for moderate pain or headache.   . albuterol (VENTOLIN  HFA) 108 (90 Base) MCG/ACT inhaler Inhale 2 puffs into the lungs every 6 (six) hours as needed for wheezing or shortness of breath.  Marland Kitchen aspirin EC 81 MG tablet Take 81 mg by mouth daily.  Marland Kitchen atorvastatin (LIPITOR) 80 MG tablet Take 1 tablet (80 mg total) by mouth daily at 6 PM.  . busPIRone (BUSPAR) 15 MG tablet Take 15 mg by mouth 3 (three) times daily.   . clopidogrel (PLAVIX) 75 MG tablet Take 1 tablet (75 mg total) by mouth daily with breakfast.  . Icosapent Ethyl 1 g CAPS Take 2 capsules (2 g total) by mouth 2 (two) times daily.  . midodrine (PROAMATINE) 5 MG tablet Take 1 tablet (5 mg total) by mouth 2 (two) times daily with a meal.  . Multiple Vitamin (MULTIVITAMIN WITH MINERALS) TABS tablet Take 1 tablet by mouth daily.  . nitroGLYCERIN (NITROSTAT) 0.4 MG SL tablet Place 0.4 mg under the tongue every 5 (five) minutes as needed for chest pain.  . pantoprazole (PROTONIX) 40 MG tablet Take 40 mg by mouth daily.  . ranolazine (RANEXA) 1000 MG SR tablet Take 1 tablet (1,000 mg total) by mouth 2 (two) times daily.  . sertraline (ZOLOFT) 25 MG tablet Take 25 mg by mouth daily.  . traZODone (DESYREL) 150 MG tablet Take 300 mg by mouth at bedtime.      Allergies:   Amiodarone, Colchicine, Compazine [prochlorperazine edisylate], and Other   Social History   Socioeconomic History  . Marital status: Divorced    Spouse name: Not on file  . Number of children: Not on file  . Years of education: Not on file  . Highest education level: Not on file  Occupational History  . Not on file  Tobacco Use  . Smoking status: Never Smoker  . Smokeless tobacco: Never Used  Substance and Sexual Activity  . Alcohol use: Yes    Comment: 11/05/2016 "maybe 1 glass of wine/month"  . Drug use: No  . Sexual activity: Not Currently  Other Topics Concern  . Not on file  Social History Narrative  . Not on file   Social Determinants of Health   Financial Resource Strain:   . Difficulty of Paying Living  Expenses: Not on file  Food Insecurity:   . Worried About Charity fundraiser in the Last Year: Not on file  . Ran Out of Food in the Last Year: Not on file  Transportation Needs:   . Lack of  Transportation (Medical): Not on file  . Lack of Transportation (Non-Medical): Not on file  Physical Activity:   . Days of Exercise per Week: Not on file  . Minutes of Exercise per Session: Not on file  Stress:   . Feeling of Stress : Not on file  Social Connections:   . Frequency of Communication with Friends and Family: Not on file  . Frequency of Social Gatherings with Friends and Family: Not on file  . Attends Religious Services: Not on file  . Active Member of Clubs or Organizations: Not on file  . Attends Archivist Meetings: Not on file  . Marital Status: Not on file     Family History: The patient's family history includes Alcohol abuse in her father; Diabetes Mellitus II in her father; Heart attack in her father; Hypertension in her father; Suicidality in her mother. ROS:   Please see the history of present illness.    All other systems reviewed and are negative.  EKGs/Labs/Other Studies Reviewed:    The following studies were reviewed today:   Recent Labs: 11/13/2018: NT-Pro BNP 305 03/04/2019: TSH 1.806 03/05/2019: ALT 21; BUN 14; Creatinine, Ser 0.80; Magnesium 1.9; Potassium 3.0; Sodium 139 03/07/2019: Hemoglobin 9.1; Platelets 232  Recent Lipid Panel    Component Value Date/Time   CHOL 133 03/05/2019 0452   TRIG 194 (H) 03/05/2019 0452   HDL 30 (L) 03/05/2019 0452   CHOLHDL 4.4 03/05/2019 0452   VLDL 39 03/05/2019 0452   LDLCALC 64 03/05/2019 0452    Physical Exam:    VS:  BP (!) 158/90 (BP Location: Right Arm, Patient Position: Sitting, Cuff Size: Normal)   Pulse 89   Ht 5\' 3"  (1.6 m)   Wt 112 lb (50.8 kg)   SpO2 98%   BMI 19.84 kg/m     Wt Readings from Last 3 Encounters:  03/19/19 112 lb (50.8 kg)  03/07/19 122 lb 1.6 oz (55.4 kg)  01/29/19 117  lb (53.1 kg)     GEN: Tearful flat affect depressed well nourished, well developed in no acute distress HEENT: Normal NECK: No JVD; No carotid bruits LYMPHATICS: No lymphadenopathy CARDIAC there is no pericardial rub RRR, no murmurs, rubs, gallops RESPIRATORY:  Clear to auscultation without rales, wheezing or rhonchi no crepitus equal breath sounds ABDOMEN: Soft, non-tender, non-distended MUSCULOSKELETAL: Right ankle is swollen tender to the touch over the lateral malleolus; No deformity  SKIN: Warm and dry NEUROLOGIC:  Alert and oriented x 3 PSYCHIATRIC:  Normal affect    Signed, Shirlee More, MD  03/19/2019 10:07 AM    [facility]

## 2019-03-19 ENCOUNTER — Encounter: Payer: Self-pay | Admitting: Cardiology

## 2019-03-19 ENCOUNTER — Other Ambulatory Visit (INDEPENDENT_AMBULATORY_CARE_PROVIDER_SITE_OTHER): Payer: Self-pay

## 2019-03-19 ENCOUNTER — Telehealth: Payer: Self-pay

## 2019-03-19 ENCOUNTER — Ambulatory Visit (INDEPENDENT_AMBULATORY_CARE_PROVIDER_SITE_OTHER): Payer: Self-pay | Admitting: Cardiology

## 2019-03-19 ENCOUNTER — Ambulatory Visit (HOSPITAL_BASED_OUTPATIENT_CLINIC_OR_DEPARTMENT_OTHER)
Admission: RE | Admit: 2019-03-19 | Discharge: 2019-03-19 | Disposition: A | Discharge status: Home / Self Care | Payer: Medicaid Other | Source: Ambulatory Visit | Attending: Cardiology | Admitting: Cardiology

## 2019-03-19 ENCOUNTER — Other Ambulatory Visit: Payer: Self-pay

## 2019-03-19 VITALS — BP 158/90 | HR 89 | Ht 63.0 in | Wt 112.0 lb

## 2019-03-19 DIAGNOSIS — S82831A Other fracture of upper and lower end of right fibula, initial encounter for closed fracture: Secondary | ICD-10-CM | POA: Insufficient documentation

## 2019-03-19 DIAGNOSIS — W19XXXA Unspecified fall, initial encounter: Secondary | ICD-10-CM | POA: Diagnosis not present

## 2019-03-19 DIAGNOSIS — R002 Palpitations: Secondary | ICD-10-CM

## 2019-03-19 DIAGNOSIS — W19XXXD Unspecified fall, subsequent encounter: Secondary | ICD-10-CM

## 2019-03-19 DIAGNOSIS — I25708 Atherosclerosis of coronary artery bypass graft(s), unspecified, with other forms of angina pectoris: Secondary | ICD-10-CM

## 2019-03-19 DIAGNOSIS — D508 Other iron deficiency anemias: Secondary | ICD-10-CM

## 2019-03-19 DIAGNOSIS — E782 Mixed hyperlipidemia: Secondary | ICD-10-CM

## 2019-03-19 MED ORDER — METOPROLOL TARTRATE 25 MG PO TABS: 25.0000 mg | ORAL_TABLET | Freq: Two times a day (BID) | ORAL | 3 refills | Status: AC

## 2019-03-19 MED ORDER — RANOLAZINE ER 1000 MG PO TB12: 1000.0000 mg | ORAL_TABLET | Freq: Two times a day (BID) | ORAL | 3 refills | Status: DC

## 2019-03-19 MED FILL — METOPROLOL TARTRATE 25 MG T: 25 | 30 days supply | Qty: 60 | Fill #0

## 2019-03-19 MED FILL — RANOLAZINE ER 1000 MG TB12: 1000 | 30 days supply | Qty: 60 | Fill #0

## 2019-03-19 NOTE — Telephone Encounter (Signed)
Per Dr. Angela Adam, patient has a  minimally displaced fracture right ankle.   She has been informed and referral to orthopedics placed.

## 2019-03-19 NOTE — Patient Instructions (Addendum)
Medication Instructions:  Your physician has recommended you make the following change in your medication:   START taking Aleve OTC or Tylenol as needed for pain   RESTART metoprolol 25 mg (1 tablet) twice daily *If you need a refill on your cardiac medications before your next appointment, please call your pharmacy*  Lab Work: NONE If you have labs (blood work) drawn today and your tests are completely normal, you will receive your results only by: Marland Kitchen MyChart Message (if you have MyChart) OR . A paper copy in the mail If you have any lab test that is abnormal or we need to change your treatment, we will call you to review the results.  Testing/Procedures: A chest x-ray takes a picture of the organs and structures inside the chest, including the heart, lungs, and blood vessels. This test can show several things, including, whether the heart is enlarges; whether fluid is building up in the lungs; and whether pacemaker / defibrillator leads are still in place.  Your physician has recommended that you wear a ZIO monitor. ZIO monitors are medical devices that record the heart's electrical activity. Doctors most often use these monitors to diagnose arrhythmias. Arrhythmias are problems with the speed or rhythm of the heartbeat. The monitor is a small, portable device. You can wear one while you do your normal daily activities. This is usually used to diagnose what is causing palpitations/syncope (passing out). You will wear this device for 7 days    Follow-Up: At [facility], you and your health needs are our priority.  As part of our continuing mission to provide you with exceptional heart care, we have created designated Provider Care Teams.  These Care Teams include your primary Cardiologist (physician) and Advanced Practice Providers (APPs -  Physician Assistants and Nurse Practitioners) who all work together to provide you with the care you need, when you need it.  Your next appointment:    3 week(s)  The format for your next appointment:   In Person  Provider:   Shirlee More, MD

## 2019-03-19 NOTE — Addendum Note (Signed)
Addended by: Beckey Rutter on: 03/19/2019 11:45 AM   Modules accepted: Orders

## 2019-03-19 NOTE — Addendum Note (Signed)
Addended by: Beckey Rutter on: 03/19/2019 03:38 PM   Modules accepted: Orders

## 2019-03-20 ENCOUNTER — Telehealth: Payer: Self-pay | Admitting: Cardiology

## 2019-03-20 NOTE — Telephone Encounter (Signed)
Informed pt that referral reviewed in Epic and referral was to Dr. Joya Salm. Pt states she has an upcoming appt with Dr. Samule Dry. No further questions

## 2019-03-20 NOTE — Telephone Encounter (Signed)
Patient states that during appointment with Dr. Bettina Gavia on 03/19/19, she was advised to see an orthopedic doctor due to minimally displaced fracture right ankle. Patient is calling to inquire about whether Dr. Bettina Gavia would like for her to see a specific provider or not. Please call to clarify.

## 2019-03-23 ENCOUNTER — Telehealth: Payer: Self-pay | Admitting: Cardiology

## 2019-03-23 NOTE — Telephone Encounter (Signed)
Yes 5 days pre and 2 days after surgery

## 2019-03-23 NOTE — Telephone Encounter (Signed)
° °  [facility] Pre-operative Risk Assessment    Request for surgical clearance:  1. What type of surgery is being performed? ORIF of Right Distal Fibula   2. When is this surgery scheduled? 03/27/19  3. What type of clearance is required (medical clearance vs. Pharmacy clearance to hold med vs. Both)? Both   4. Are there any medications that need to be held prior to surgery and how long? Plavix, held for however long Dr. Bettina Gavia felt necessary.  5. Practice name and name of physician performing surgery? [facility], Dr. Samule Dry   6. What is your office phone number [phone]   7.   What is your office fax number [phone]  8.   Anesthesia type (None, local, MAC, general) ? Nerve Block    Trilby Drummer 03/23/2019, 4:37 PM  _________________________________________________________________   (provider comments below)

## 2019-03-23 NOTE — Telephone Encounter (Signed)
Hi Dr. Bettina Gavia,  Gabrielle Page is scheduled for ORIF of right distal fibula on 03/27/2019. Patient recently admitted from 03/04/19 to 03/07/19 for chest pain. Cardiac cath showed stable disease compared to prior cath and continued medical therapy was recommended. You recently saw her for follow-up on 03/19/2019 at which time she reported a fall at home with subsequent right sided chest pain, shortness of breath, and right ankle pain/swelling. Chest x-ray was unremarkable but x-ray of right ankle showed distal fibula fracture. You also ordered a Zio monitor at that visit to screen for any arrhythmias. She was referred to Ortho and they have recommended surgery.  Do you feel she is OK to go ahead and have this surgery from cardiac standpoint? If so, how long can she hold Plavix for?  Please route response back to P CV DIV PREOP.  Thank you! Gabrielle Page

## 2019-03-24 NOTE — Telephone Encounter (Signed)
   Primary Cardiologist: Shirlee More, MD  Chart reviewed as part of pre-operative protocol coverage. Patient recently admitted in January 2021 for chest pain. Cardiac catheterization during admission showed stable disease and continued medical therapy was recommended. Patient was recently seen by Dr. Bettina Gavia for follow-up on 03/19/2019 at which time she reported a recent fall at home and was found to have a fracture in her distal fibula. Patient is now scheduled to have a ORIF of right distal fibula on 03/27/2019.   Per Dr. Bettina Gavia, patient is OK to proceed with this surgery. No further cardiovascular testing needed at this time. Patient can hold Plavix for 5 days prior to procedure and 2 days post procedure.   I will route this recommendation to the requesting party via Epic fax function and remove from pre-op pool.  Please call with questions.  Darreld Mclean, PA-C 03/24/2019, 7:36 AM

## 2019-03-26 ENCOUNTER — Telehealth: Payer: Self-pay | Admitting: Cardiology

## 2019-03-26 NOTE — Telephone Encounter (Signed)
Ivin Booty from [facility] is calling requesting the patients last appointment notes before the patients surgery scheduled for Friday 03/27/19. Notes can be faxed over to the number [phone].

## 2019-03-26 NOTE — Telephone Encounter (Signed)
Last office note printed and faxed to [facility] [phone].

## 2019-04-02 ENCOUNTER — Encounter: Payer: Self-pay | Admitting: Family

## 2019-04-02 MED ORDER — FUROSEMIDE 40 MG PO TABS: 40.0000 mg | ORAL_TABLET | Freq: Every day | ORAL | 3 refills | Status: DC

## 2019-04-02 NOTE — Telephone Encounter (Signed)
Reports swelling, sob and weight gain 03/31/2019 weight was 111 lbs 04/02/2019 weight is 115 lbs Both weights were around 8:00 am, same scale and same amount clothes Reports feeling bloated in abdomen, swelling in abdomen, swelling in left foot and both arms (worse in left arm). Reports some sob at rest and with activity.  Reports having a non-productive cough Reports having dizziness and chest pain all the time that is unchanged Medications reviewed Advised that message would be sent to her provider for recommendations. Advised if sob get worse, to go to the ED for an evaluation. Verbalized understanding

## 2019-04-07 NOTE — Telephone Encounter (Signed)
Lm to call back ./cy 

## 2019-04-08 MED FILL — PANTOPRAZOLE SOD DR 40 MG T: 40 | 10 days supply | Qty: 20 | Fill #2

## 2019-04-09 ENCOUNTER — Ambulatory Visit: Payer: Self-pay | Admitting: Cardiology

## 2019-04-09 ENCOUNTER — Other Ambulatory Visit: Payer: Self-pay

## 2019-04-09 ENCOUNTER — Encounter: Payer: Self-pay | Admitting: Cardiology

## 2019-04-09 ENCOUNTER — Ambulatory Visit (INDEPENDENT_AMBULATORY_CARE_PROVIDER_SITE_OTHER): Payer: Self-pay | Admitting: Cardiology

## 2019-04-09 VITALS — BP 110/62 | HR 59 | Temp 97.3°F | Ht 63.0 in | Wt 130.0 lb

## 2019-04-09 DIAGNOSIS — I2510 Atherosclerotic heart disease of native coronary artery without angina pectoris: Secondary | ICD-10-CM

## 2019-04-09 DIAGNOSIS — R748 Abnormal levels of other serum enzymes: Secondary | ICD-10-CM

## 2019-04-09 DIAGNOSIS — D508 Other iron deficiency anemias: Secondary | ICD-10-CM

## 2019-04-09 DIAGNOSIS — I25708 Atherosclerosis of coronary artery bypass graft(s), unspecified, with other forms of angina pectoris: Secondary | ICD-10-CM

## 2019-04-09 DIAGNOSIS — I5032 Chronic diastolic (congestive) heart failure: Secondary | ICD-10-CM

## 2019-04-09 DIAGNOSIS — E876 Hypokalemia: Secondary | ICD-10-CM

## 2019-04-09 DIAGNOSIS — I361 Nonrheumatic tricuspid (valve) insufficiency: Secondary | ICD-10-CM

## 2019-04-09 DIAGNOSIS — R0602 Shortness of breath: Secondary | ICD-10-CM

## 2019-04-09 DIAGNOSIS — K7200 Acute and subacute hepatic failure without coma: Secondary | ICD-10-CM

## 2019-04-09 DIAGNOSIS — E782 Mixed hyperlipidemia: Secondary | ICD-10-CM

## 2019-04-09 NOTE — Progress Notes (Deleted)
Cardiology Office Note:    Date:  04/09/2019   ID:  Gabrielle Page, DOB [birth year], MRN [id number]  PCP:  Jaclynn Major, NP  Cardiologist:  Shirlee More, MD    Referring MD: Jaclynn Major, NP    ASSESSMENT:    No diagnosis found. PLAN:    In order of problems listed above:  1. ***   Next appointment: ***   Medication Adjustments/Labs and Tests Ordered: Current medicines are reviewed at length with the patient today.  Concerns regarding medicines are outlined above.  No orders of the defined types were placed in this encounter.  No orders of the defined types were placed in this encounter.   No chief complaint on file.   History of Present Illness:    Gabrielle Page is a 59 y.o. female with a hx of CAD (NSTEMI PCI/DES of LAD 10/2016, CABG 03/26/17 with LIMA to LAD, SVG to Diag, and SVG to OM), postoperative PAF/pericardial effusion, HLD, LBBB, diastolic dysfunction  last seen 12/03/2018.She underwent cardiac catheterization 11/24/18 and had a periprocedure Type 4 MI- "occluded occlusion of native OM1 at anastomosis site. Attempted PTCA of 99% lesion, unable to advance, with wire removal there was acute occlusion of downstream OM", occlused SVG-OM and SVG-diagonal, small LIMA-LAD. She was readmitted to the hospital 03/04/2019 to 03/07/2019 for chest pain her high-sensitivity troponins were normal.  Because of ongoing chest pain in hospital she underwent repeat coronary angiography.  Major marginal branch that had been occluded during the last PCI attempt was now patent with a 90% stenosis and was felt to be best treated medically.  Was difficult to treat angina because of hypotension in hospital she was unable to tolerate beta-blockers and was placed on ranolazine.  To increase react perfusion she was placed on alpha agonist midodrine.  She was last seen 03/19/2019. Compliance with diet, lifestyle and medications: ***  03/19/2019: IMPRESSION: Distal  fibular fracture with overlying soft tissue swelling.  Zio report 04/07/2019: A ZIO monitor was performed for 6 days and 23 hours to evaluate for palpitation. The heart rhythm throughout was sinus and leftbundlebranchblock, with minimum average and maximum heart rates of 50, 64 and 97 bpm. There were no pauses of 3 seconds or greater and no episodes of second or third-degree AV block or sinus node exit block. Ventricular ectopy was rare with isolated PVCs Supraventricular ectopy was rare with isolated APCs and no episodes of atrial fibrillation or flutter. There were 7 triggered and no diary events.  1 triggered event showed a single PVC in sinus rhythm 2 showed isolated APCs and sinus rhythm and the others sinus rhythm without arrhythmia.  Conclusion: left bundle branch block, rare ventricular and supraventricular ectopy.    Past Medical History:  Diagnosis Date  . Asthma   . Chronic bronchitis ([place])    "usually get it q yr" (11/05/2016)  . Coronary artery disease   . GERD (gastroesophageal reflux disease)   . Headache    "weekly" (11/05/2016)  . Heart murmur    "dx'd when I was a child"  . Hepatitis 1991   "when I had my gallbladder out" (11/05/2016)  . History of blood transfusion 1985   S/P childbirth  . History of kidney stones   . HLD (hyperlipidemia) 11/27/2018  . NSTEMI (non-ST elevated myocardial infarction) ([place]) 11/05/2016   9/18 PCI/DESx1 to p/mLAD  . Pancreatitis 1991  . Pneumonia    "twice" (11/05/2016)    Past Surgical History:  Procedure Laterality Date  . ABDOMINAL HYSTERECTOMY  1990  . APPENDECTOMY  1992  . BILE DUCT EXPLORATION  1991   "reconstruction"  . CESAREAN SECTION  1985  . CHOLECYSTECTOMY OPEN  1991  . CORONARY ANGIOPLASTY WITH STENT PLACEMENT  11/05/2016  . CORONARY ARTERY BYPASS GRAFT N/A 03/26/2017   Procedure: CORONARY ARTERY BYPASS GRAFTING (CABG) x 3 WITH ENDOSCOPIC HARVESTING OF RIGHT SAPHENOUS VEIN;  Surgeon: Ivin Poot, MD;   Location: [place];  Service: Open Heart Surgery;  Laterality: N/A;  . CORONARY STENT INTERVENTION N/A 11/05/2016   Procedure: CORONARY STENT INTERVENTION;  Surgeon: Martinique, Peter M, MD;  Location: [place] [facility];  Service: Cardiovascular;  Laterality: N/A;  . CYSTOSCOPY WITH STENT PLACEMENT  "3 times" (11/05/2016)  . DILATION AND CURETTAGE OF UTERUS  1989  . INTRAVASCULAR ULTRASOUND/IVUS N/A 03/22/2017   Procedure: Intravascular Ultrasound/IVUS;  Surgeon: Martinique, Peter M, MD;  Location: [place] [facility];  Service: Cardiovascular;  Laterality: N/A;  . KNEE ARTHROSCOPY Right 2000s  . LEFT HEART CATH AND CORONARY ANGIOGRAPHY N/A 11/05/2016   Procedure: LEFT HEART CATH AND CORONARY ANGIOGRAPHY;  Surgeon: Martinique, Peter M, MD;  Location: [place] [facility];  Service: Cardiovascular;  Laterality: N/A;  . LEFT HEART CATH AND CORONARY ANGIOGRAPHY N/A 03/22/2017   Procedure: LEFT HEART CATH AND CORONARY ANGIOGRAPHY;  Surgeon: Martinique, Peter M, MD;  Location: [place] [facility];  Service: Cardiovascular;  Laterality: N/A;  . LEFT HEART CATH AND CORS/GRAFTS ANGIOGRAPHY N/A 03/05/2019   Procedure: LEFT HEART CATH AND CORS/GRAFTS ANGIOGRAPHY;  Surgeon: Leonie Man, MD;  Location: [place] [facility];  Service: Cardiovascular;  Laterality: N/A;  . NASAL SINUS SURGERY  ~ 2009   "exposed to mildew; had to have the infectoin drained; cut the outside of my face"  . OVARIAN CYST SURGERY  X 6  . RIGHT/LEFT HEART CATH AND CORONARY/GRAFT ANGIOGRAPHY N/A 11/24/2018   Procedure: RIGHT/LEFT HEART CATH AND CORONARY/GRAFT ANGIOGRAPHY;  Surgeon: Leonie Man, MD;  Location: [place] [facility];  Service: Cardiovascular;  Laterality: N/A;  . TEE WITHOUT CARDIOVERSION N/A 03/26/2017   Procedure: TRANSESOPHAGEAL ECHOCARDIOGRAM (TEE);  Surgeon: Prescott Gum, [name], MD;  Location: [place];  Service: Open Heart Surgery;  Laterality: N/A;    Current Medications: No outpatient medications have been marked as taking for the 04/09/19  encounter (Appointment) with Richardo Priest, MD.     Allergies:   Amiodarone, Colchicine, Compazine [prochlorperazine edisylate], and Other   Social History   Socioeconomic History  . Marital status: Divorced    Spouse name: Not on file  . Number of children: Not on file  . Years of education: Not on file  . Highest education level: Not on file  Occupational History  . Not on file  Tobacco Use  . Smoking status: Never Smoker  . Smokeless tobacco: Never Used  Substance and Sexual Activity  . Alcohol use: Yes    Comment: 11/05/2016 "maybe 1 glass of wine/month"  . Drug use: No  . Sexual activity: Not Currently  Other Topics Concern  . Not on file  Social History Narrative  . Not on file   Social Determinants of Health   Financial Resource Strain:   . Difficulty of Paying Living Expenses: Not on file  Food Insecurity:   . Worried About Charity fundraiser in the Last Year: Not on file  . Ran Out of Food in the Last Year: Not on file  Transportation Needs:   . Lack of Transportation (Medical): Not on file  .  Lack of Transportation (Non-Medical): Not on file  Physical Activity:   . Days of Exercise per Week: Not on file  . Minutes of Exercise per Session: Not on file  Stress:   . Feeling of Stress : Not on file  Social Connections:   . Frequency of Communication with Friends and Family: Not on file  . Frequency of Social Gatherings with Friends and Family: Not on file  . Attends Religious Services: Not on file  . Active Member of Clubs or Organizations: Not on file  . Attends Archivist Meetings: Not on file  . Marital Status: Not on file     Family History: The patient's ***family history includes Alcohol abuse in her father; Diabetes Mellitus II in her father; Heart attack in her father; Hypertension in her father; Suicidality in her mother. ROS:   Please see the history of present illness.    All other systems reviewed and are  negative.  EKGs/Labs/Other Studies Reviewed:    The following studies were reviewed today:  EKG:  EKG ordered today and personally reviewed.  The ekg ordered today demonstrates ***  Recent Labs: 11/13/2018: NT-Pro BNP 305 03/04/2019: TSH 1.806 03/05/2019: ALT 21; BUN 14; Creatinine, Ser 0.80; Magnesium 1.9; Potassium 3.0; Sodium 139 03/07/2019: Hemoglobin 9.1; Platelets 232  Recent Lipid Panel    Component Value Date/Time   CHOL 133 03/05/2019 0452   TRIG 194 (H) 03/05/2019 0452   HDL 30 (L) 03/05/2019 0452   CHOLHDL 4.4 03/05/2019 0452   VLDL 39 03/05/2019 0452   LDLCALC 64 03/05/2019 0452    Physical Exam:    VS:  There were no vitals taken for this visit.    Wt Readings from Last 3 Encounters:  03/19/19 112 lb (50.8 kg)  03/07/19 122 lb 1.6 oz (55.4 kg)  01/29/19 117 lb (53.1 kg)     GEN: *** Well nourished, well developed in no acute distress HEENT: Normal NECK: No JVD; No carotid bruits LYMPHATICS: No lymphadenopathy CARDIAC: ***RRR, no murmurs, rubs, gallops RESPIRATORY:  Clear to auscultation without rales, wheezing or rhonchi  ABDOMEN: Soft, non-tender, non-distended MUSCULOSKELETAL:  No edema; No deformity  SKIN: Warm and dry NEUROLOGIC:  Alert and oriented x 3 PSYCHIATRIC:  Normal affect    Signed, Shirlee More, MD  04/09/2019 7:41 AM    [place]

## 2019-04-09 NOTE — Progress Notes (Signed)
Cardiology Office Note:    Date:  04/09/2019   ID:  Gabrielle Page, DOB August 09, 1960, MRN [id number]  PCP:  Jaclynn Major, NP  Cardiologist:  Shirlee More, MD    Referring MD: Jaclynn Major, NP    ASSESSMENT:    1. Chronic diastolic heart failure ([place])   2. Coronary artery disease of bypass graft of native heart with stable angina pectoris ([place])   3. Iron deficiency anemia secondary to inadequate dietary iron intake    PLAN:    In order of problems listed above:  1. Clinical problem today's heart failure which is clearly decompensated associated with severe anemia and I think outside the balance of which can be treated as an outpatient effectively and safely.  I called spoke to the hospitalist who graciously agreed to admit her to the hospital as a direct admission with plans for IV diuretics transfusion she will need parenteral IV iron and I would recheck an echocardiogram with her marked neck vein distention she has had pericardial effusion in the past and she may have a Kussmaul sign on physical examination.  Her last echocardiogram did not show any pericardial fluid. 2. Stable CAD no indication of acute coronary syndrome 3. Complex problem with severe iron deficiency anemia likely worsened by her recent orthopedic surgery with blood loss once she has had diuresis I am giving her a unit of blood split is half packed cells and she will require parenteral IV iron to reconstitute her iron stores.  Generally has these individuals diuresis and extracellular volume diminishes her hemoglobin hematocrit will increase. 4. She has chronic idiopathic hypotension and requires midodrine for blood pressure support to tolerate a cardiac medications please continue it.   Next appointment: After the hospitalization   Medication Adjustments/Labs and Tests Ordered: Current medicines are reviewed at length with the patient today.  Concerns regarding medicines are outlined above.  No  orders of the defined types were placed in this encounter.  No orders of the defined types were placed in this encounter.   Chief Complaint  Patient presents with  . Follow-up  . Congestive Heart Failure    History of Present Illness:    Gabrielle Page is a 59 y.o. female with a hx of CAD (NSTEMI PCI/DES of LAD 10/2016, CABG 03/26/17 with LIMA to LAD, SVG to Diag, and SVG to OM), postoperative PAF/pericardial effusion, HLD, LBBB, diastolic dysfunction  last seen 12/03/2018.She underwent cardiac catheterization 11/24/18 and had a periprocedure Type 4 MI- "occluded occlusion of native OM1 at anastomosis site. Attempted PTCA of 99% lesion, unable to advance, with wire removal there was acute occlusion of downstream OM", occlused SVG-OM and SVG-diagonal, small LIMA-LAD. No evidence of CHF. She was readmitted to the hospital 03/04/2019 to 03/07/2019 for chest pain her high-sensitivity troponins were normal. Because of ongoing chest pain in hospital she underwent repeat coronary angiography. Major marginal branch that had been occluded during the last PCI attempt was now patent with a 90% stenosis and was felt to be best treated medically. Was difficult to treat angina because of hypotension in hospital she was unable to tolerate beta-blockers and was placed on ranolazine. To increase react perfusion she was placed on alpha agonist midodrine. Responded to treatment was discharged on intensified medical therapy.     She was last seen by me12/17/2020.  At that time she was iron deficient on oral iron of unimproved should require up parenteral IV replacement.  Compliance with diet, lifestyle and medications: yes  Initially she was scheduled  to see me routinely she canceled she went to the emergency room general health complaints she tells me what bothers her the most when shortness of breath palpitation she also complained of constipation which seemed to been the focus of ED evaluation she had x-rays  she was disimpacted soapsuds enema and was felt to be a good  hospital discharge.  She comes my office today and is breathless with any activity is mildly short of breath at rest.  Her hemoglobin was 7.6 she has chronic iron deficiency anemia her potassium was 2.7 N-terminal proBNP was markedly elevated at 5840 D-dimer is severely elevated she had a CTA of the chest which showed no pulmonary embolism patient findings of heart failure and small bilateral pleural effusions.  On presentation today she is decompensated heart failure and has neck vein distention and 2-3+ lower extremity above the knee edema and 3-4+ presacral edema.  She has an abrupt increase in her body weight 15 pounds in the last 1 to 5 days Phillip Heal EF October 55 to 60% right coronary angiography 03/05/2019 which showed stable CAD normal ejection fraction at that time was volume contracted and hypotensive.  History of bypass surgery graft occlusion and has failed attempted percutaneous revascularization she also had a fall fracture right ankle and recent orthopedic surgery at [facility]. Past Medical History:  Diagnosis Date  . Asthma   . Chronic bronchitis ([place])    "usually get it q yr" (11/05/2016)  . Coronary artery disease   . GERD (gastroesophageal reflux disease)   . Headache    "weekly" (11/05/2016)  . Heart murmur    "dx'd when I was a child"  . Hepatitis 1991   "when I had my gallbladder out" (11/05/2016)  . History of blood transfusion 1985   S/P childbirth  . History of kidney stones   . HLD (hyperlipidemia) 11/27/2018  . NSTEMI (non-ST elevated myocardial infarction) ([place]) 11/05/2016   9/18 PCI/DESx1 to p/mLAD  . Pancreatitis 1991  . Pneumonia    "twice" (11/05/2016)    Past Surgical History:  Procedure Laterality Date  . ABDOMINAL HYSTERECTOMY  1990  . APPENDECTOMY  1992  . BILE DUCT EXPLORATION  1991   "reconstruction"  . CESAREAN SECTION  1985  . CHOLECYSTECTOMY OPEN  1991  . CORONARY ANGIOPLASTY WITH  STENT PLACEMENT  11/05/2016  . CORONARY ARTERY BYPASS GRAFT N/A 03/26/2017   Procedure: CORONARY ARTERY BYPASS GRAFTING (CABG) x 3 WITH ENDOSCOPIC HARVESTING OF RIGHT SAPHENOUS VEIN;  Surgeon: Ivin Poot, MD;  Location: [place];  Service: Open Heart Surgery;  Laterality: N/A;  . CORONARY STENT INTERVENTION N/A 11/05/2016   Procedure: CORONARY STENT INTERVENTION;  Surgeon: Martinique, Peter M, MD;  Location: [place] [facility];  Service: Cardiovascular;  Laterality: N/A;  . CYSTOSCOPY WITH STENT PLACEMENT  "3 times" (11/05/2016)  . DILATION AND CURETTAGE OF UTERUS  1989  . INTRAVASCULAR ULTRASOUND/IVUS N/A 03/22/2017   Procedure: Intravascular Ultrasound/IVUS;  Surgeon: Martinique, Peter M, MD;  Location: [place] [facility];  Service: Cardiovascular;  Laterality: N/A;  . KNEE ARTHROSCOPY Right 2000s  . LEFT HEART CATH AND CORONARY ANGIOGRAPHY N/A 11/05/2016   Procedure: LEFT HEART CATH AND CORONARY ANGIOGRAPHY;  Surgeon: Martinique, Peter M, MD;  Location: [place] [facility];  Service: Cardiovascular;  Laterality: N/A;  . LEFT HEART CATH AND CORONARY ANGIOGRAPHY N/A 03/22/2017   Procedure: LEFT HEART CATH AND CORONARY ANGIOGRAPHY;  Surgeon: Martinique, Peter M, MD;  Location: [place] [facility];  Service: Cardiovascular;  Laterality: N/A;  . LEFT HEART CATH AND CORS/GRAFTS ANGIOGRAPHY N/A 03/05/2019   Procedure: LEFT HEART CATH AND CORS/GRAFTS ANGIOGRAPHY;  Surgeon: Leonie Man, MD;  Location: [place] [facility];  Service: Cardiovascular;  Laterality: N/A;  . NASAL SINUS SURGERY  ~ 2009   "exposed to mildew; had to have the infectoin drained; cut the outside of my face"  . OVARIAN CYST SURGERY  X 6  . RIGHT/LEFT HEART CATH AND CORONARY/GRAFT ANGIOGRAPHY N/A 11/24/2018   Procedure: RIGHT/LEFT HEART CATH AND CORONARY/GRAFT ANGIOGRAPHY;  Surgeon: Leonie Man, MD;  Location: [place] [facility];  Service: Cardiovascular;  Laterality: N/A;  . TEE WITHOUT CARDIOVERSION N/A 03/26/2017   Procedure: TRANSESOPHAGEAL  ECHOCARDIOGRAM (TEE);  Surgeon: Prescott Gum, [name], MD;  Location: [place];  Service: Open Heart Surgery;  Laterality: N/A;    Current Medications: Current Meds  Medication Sig  . acetaminophen (TYLENOL) 325 MG tablet Take 325 mg by mouth every 6 (six) hours as needed for moderate pain or headache.   . albuterol (VENTOLIN HFA) 108 (90 Base) MCG/ACT inhaler Inhale 2 puffs into the lungs every 6 (six) hours as needed for wheezing or shortness of breath.  Marland Kitchen aspirin EC 81 MG tablet Take 81 mg by mouth daily.  Marland Kitchen atorvastatin (LIPITOR) 80 MG tablet Take 1 tablet (80 mg total) by mouth daily at 6 PM.  . busPIRone (BUSPAR) 15 MG tablet Take 15 mg by mouth 3 (three) times daily.   . clopidogrel (PLAVIX) 75 MG tablet Take 1 tablet (75 mg total) by mouth daily with breakfast.  . furosemide (LASIX) 40 MG tablet Take 1 tablet (40 mg total) by mouth daily.  . metoprolol tartrate (LOPRESSOR) 25 MG tablet Take 1 tablet (25 mg total) by mouth 2 (two) times daily.  . midodrine (PROAMATINE) 5 MG tablet Take 1 tablet (5 mg total) by mouth 2 (two) times daily with a meal.  . Multiple Vitamin (MULTIVITAMIN WITH MINERALS) TABS tablet Take 1 tablet by mouth daily.  . nitroGLYCERIN (NITROSTAT) 0.4 MG SL tablet Place 0.4 mg under the tongue every 5 (five) minutes as needed for chest pain.  . pantoprazole (PROTONIX) 40 MG tablet Take 40 mg by mouth daily.  . sertraline (ZOLOFT) 25 MG tablet Take 25 mg by mouth daily.  . traZODone (DESYREL) 150 MG tablet Take 300 mg by mouth at bedtime.      Allergies:   Amiodarone, Colchicine, Compazine [prochlorperazine edisylate], and Other   Social History   Socioeconomic History  . Marital status: Divorced    Spouse name: Not on file  . Number of children: Not on file  . Years of education: Not on file  . Highest education level: Not on file  Occupational History  . Not on file  Tobacco Use  . Smoking status: Never Smoker  . Smokeless tobacco: Never Used  Substance and  Sexual Activity  . Alcohol use: Yes    Comment: 11/05/2016 "maybe 1 glass of wine/month"  . Drug use: No  . Sexual activity: Not Currently  Other Topics Concern  . Not on file  Social History Narrative  . Not on file   Social Determinants of Health   Financial Resource Strain:   . Difficulty of Paying Living Expenses: Not on file  Food Insecurity:   . Worried About Charity fundraiser in the Last Year: Not on file  . Ran Out of Food in the Last Year: Not on file  Transportation Needs:   . Lack  of Transportation (Medical): Not on file  . Lack of Transportation (Non-Medical): Not on file  Physical Activity:   . Days of Exercise per Week: Not on file  . Minutes of Exercise per Session: Not on file  Stress:   . Feeling of Stress : Not on file  Social Connections:   . Frequency of Communication with Friends and Family: Not on file  . Frequency of Social Gatherings with Friends and Family: Not on file  . Attends Religious Services: Not on file  . Active Member of Clubs or Organizations: Not on file  . Attends Archivist Meetings: Not on file  . Marital Status: Not on file     Family History: The patient's family history includes Alcohol abuse in her father; Diabetes Mellitus II in her father; Heart attack in her father; Hypertension in her father; Suicidality in her mother. ROS:   Please see the history of present illness.    All other systems reviewed and are negative.  EKGs/Labs/Other Studies Reviewed:    The following studies were reviewed today:    Recent Labs: 11/13/2018: NT-Pro BNP 305 03/04/2019: TSH 1.806 03/05/2019: ALT 21; BUN 14; Creatinine, Ser 0.80; Magnesium 1.9; Potassium 3.0; Sodium 139 03/07/2019: Hemoglobin 9.1; Platelets 232  Recent Lipid Panel    Component Value Date/Time   CHOL 133 03/05/2019 0452   TRIG 194 (H) 03/05/2019 0452   HDL 30 (L) 03/05/2019 0452   CHOLHDL 4.4 03/05/2019 0452   VLDL 39 03/05/2019 0452   LDLCALC 64 03/05/2019  0452    Physical Exam:    VS:  BP 110/62   Pulse (!) 59   Temp (!) 97.3 F (36.3 C)   Ht 5\' 3"  (1.6 m)   Wt 130 lb (59 kg)   SpO2 98%   BMI 23.03 kg/m     Wt Readings from Last 3 Encounters:  04/09/19 130 lb (59 kg)  03/19/19 112 lb (50.8 kg)  03/07/19 122 lb 1.6 oz (55.4 kg)     GEN:  Well nourished, well developed in no acute distress he has pallor of the skin of membranes HEENT: Normal NECK: No JVD; No carotid bruits LYMPHATICS: No lymphadenopathy CARDIAC: RRR, no murmurs, rubs, gallops RESPIRATORY:  Clear to auscultation without rales, wheezing or rhonchi  ABDOMEN: Soft, non-tender, non-distended MUSCULOSKELETAL: She has 2-3+ lower extremity above the knee pitting as well as greater than 4+ localized sacral edema; No deformity  SKIN: Warm and dry NEUROLOGIC:  Alert and oriented x 3 PSYCHIATRIC:  Normal affect    Signed, Shirlee More, MD  04/09/2019 11:43 AM    [place]

## 2019-04-09 NOTE — Patient Instructions (Signed)
You are being direct admitted to [place] over to [facility] and they will take care of you.  Hope you feel better soon.

## 2019-04-10 DIAGNOSIS — R7989 Other specified abnormal findings of blood chemistry: Secondary | ICD-10-CM

## 2019-04-10 DIAGNOSIS — I10 Essential (primary) hypertension: Secondary | ICD-10-CM

## 2019-04-10 DIAGNOSIS — D649 Anemia, unspecified: Secondary | ICD-10-CM

## 2019-04-10 DIAGNOSIS — I2510 Atherosclerotic heart disease of native coronary artery without angina pectoris: Secondary | ICD-10-CM

## 2019-04-10 DIAGNOSIS — I340 Nonrheumatic mitral (valve) insufficiency: Secondary | ICD-10-CM

## 2019-04-10 DIAGNOSIS — R0602 Shortness of breath: Secondary | ICD-10-CM

## 2019-04-11 DIAGNOSIS — I10 Essential (primary) hypertension: Secondary | ICD-10-CM

## 2019-04-11 DIAGNOSIS — I2510 Atherosclerotic heart disease of native coronary artery without angina pectoris: Secondary | ICD-10-CM

## 2019-04-11 DIAGNOSIS — I447 Left bundle-branch block, unspecified: Secondary | ICD-10-CM

## 2019-04-11 DIAGNOSIS — I340 Nonrheumatic mitral (valve) insufficiency: Secondary | ICD-10-CM

## 2019-04-12 DIAGNOSIS — D649 Anemia, unspecified: Secondary | ICD-10-CM

## 2019-04-13 MED FILL — RANOLAZINE ER 1000 MG TB12: 1000 | 30 days supply | Qty: 60 | Fill #1

## 2019-04-13 MED FILL — PANTOPRAZOLE SOD DR 40 MG T: 40 | 30 days supply | Qty: 60 | Fill #0

## 2019-04-17 ENCOUNTER — Encounter: Payer: Self-pay | Admitting: Cardiology

## 2019-04-20 ENCOUNTER — Ambulatory Visit: Payer: Medicaid Other | Admitting: Cardiology

## 2019-05-06 MED FILL — RANOLAZINE ER 1000 MG TB12: 1000 | 30 days supply | Qty: 60 | Fill #2

## 2019-05-07 MED FILL — NITROGLYCERIN 0.4 MG SUBL: 0.4 | 15 days supply | Qty: 25 | Fill #0

## 2019-05-14 ENCOUNTER — Other Ambulatory Visit: Payer: Self-pay

## 2019-05-14 DIAGNOSIS — K5903 Drug induced constipation: Secondary | ICD-10-CM

## 2019-05-14 DIAGNOSIS — D649 Anemia, unspecified: Secondary | ICD-10-CM

## 2019-05-14 DIAGNOSIS — T402X5A Adverse effect of other opioids, initial encounter: Secondary | ICD-10-CM

## 2019-05-14 DIAGNOSIS — K5641 Fecal impaction: Secondary | ICD-10-CM | POA: Insufficient documentation

## 2019-05-14 DIAGNOSIS — Z8711 Personal history of peptic ulcer disease: Secondary | ICD-10-CM

## 2019-05-14 DIAGNOSIS — I340 Nonrheumatic mitral (valve) insufficiency: Secondary | ICD-10-CM

## 2019-05-14 DIAGNOSIS — R55 Syncope and collapse: Secondary | ICD-10-CM

## 2019-05-14 DIAGNOSIS — K589 Irritable bowel syndrome without diarrhea: Secondary | ICD-10-CM

## 2019-05-14 DIAGNOSIS — I10 Essential (primary) hypertension: Secondary | ICD-10-CM

## 2019-05-14 DIAGNOSIS — F419 Anxiety disorder, unspecified: Secondary | ICD-10-CM

## 2019-05-14 DIAGNOSIS — K5900 Constipation, unspecified: Secondary | ICD-10-CM

## 2019-05-14 DIAGNOSIS — R935 Abnormal findings on diagnostic imaging of other abdominal regions, including retroperitoneum: Secondary | ICD-10-CM

## 2019-05-14 HISTORY — DX: Essential (primary) hypertension: I10

## 2019-05-14 HISTORY — DX: Anxiety disorder, unspecified: F41.9

## 2019-05-14 HISTORY — DX: Syncope and collapse: R55

## 2019-05-14 HISTORY — DX: Abnormal findings on diagnostic imaging of other abdominal regions, including retroperitoneum: R93.5

## 2019-05-14 HISTORY — DX: Constipation, unspecified: K59.00

## 2019-05-14 HISTORY — DX: Nonrheumatic mitral (valve) insufficiency: I34.0

## 2019-05-14 HISTORY — DX: Adverse effect of other opioids, initial encounter: T40.2X5A

## 2019-05-14 HISTORY — DX: Drug induced constipation: K59.03

## 2019-05-14 HISTORY — DX: Fecal impaction: K56.41

## 2019-05-14 HISTORY — DX: Irritable bowel syndrome, unspecified: K58.9

## 2019-05-14 HISTORY — DX: Personal history of peptic ulcer disease: Z87.11

## 2019-05-14 HISTORY — DX: Anemia, unspecified: D64.9

## 2019-05-14 NOTE — Progress Notes (Signed)
Cardiology Office Note:    Date:  05/15/2019   ID:  Gabrielle Page, DOB January 29, 1961, MRN [id number]  PCP:  Jaclynn Major, NP  Cardiologist:  Shirlee More, MD    Referring MD: Jaclynn Major, NP    ASSESSMENT:    1. Anemia, unspecified type   2. Hypokalemia   3. Idiopathic hypotension   4. Coronary artery disease of bypass graft of native heart with stable angina pectoris ([place])   5. Chronic diastolic heart failure ([place])   6. Mixed hyperlipidemia    PLAN:    In order of problems listed above:  1. Improved hemoglobin greater than 10 she will remain on iron.  A great deal of her decompensation with heart failure and hypotension was due to severe symptomatic anemia. 2. Resolved normal potassium recently 3. Improved continue midodrine 4. Stable CAD medical therapy rehabilitation has been remarkably effective in restoring the quality of her life she is on intensive medical regimen with dual antiplatelet low-dose diuretic beta blocker. 5. Compensated continue her diuretic 6. Continue high intensity statin lipids are ideal   Next appointment: 3 months   Medication Adjustments/Labs and Tests Ordered: Current medicines are reviewed at length with the patient today.  Concerns regarding medicines are outlined above.  No orders of the defined types were placed in this encounter.  No orders of the defined types were placed in this encounter.   No chief complaint on file.   History of Present Illness:    Gabrielle Page is a 59 y.o. female with a hx of  CAD (NSTEMI PCI/DES of LAD 10/2016, CABG 03/26/17 with LIMA to LAD, SVG to Diag, and SVG to OM), postoperative PAF/pericardial effusion, HLD, LBBB, diastolic dysfunction  last seen 12/03/2018.She underwent cardiac catheterization 11/24/18 and had a periprocedure Type 4 MI- "occluded occlusion of native OM1 at anastomosis site. Attempted PTCA of 99% lesion, unable to advance, with wire removal there was acute occlusion  of downstream OM", occlused SVG-OM and SVG-diagonal, small LIMA-LAD. No evidence of CHF. She was readmitted to the hospital 03/04/2019 to 03/07/2019 for chest pain her high-sensitivity troponins were normal. Because of ongoing chest pain in hospital she underwent repeat coronary angiography. Major marginal branch that had been occluded during the last PCI attempt was now patent with a 90% stenosis and was felt to be best treated medically. Was difficult to treat angina because of hypotension in hospital she was unable to tolerate beta-blockers and was placed on ranolazine. To increase myocardial perfusion she was placed on alpha agonist midodrine. Responded to treatment was discharged on intensified medical therapy.  She was last seen 04/09/2019 with severe anemia and decompensated heart failure requiring admission transfusion and diuresis.. Compliance with diet, lifestyle and medications: Yes  I reviewed ED records prior to the visit.  She is better and is taking an anxiety lytic drug as needed.  She is return to cardiac rehab for 2 weeks markedly improved the quality of her life no chest pain shortness of breath strength and endurance is improved.  She no longer is symptomatic hypotension taking midodrine.  She has mild edema where she had ankle fracture with ORIF but no orthopnea palpitations syncope or chest pain.  She was seen [facility] emergency room 04/17/1998 21-week weakness tremor was felt to be anxiety improved with Ativan.  Laboratory studies showed hemoglobin 10.0 potassium 4.5 normal renal function and an EKG was interpreted as showing sinus rhythm left bundle branch block no change from baseline.  I reviewed the records prior to  the office visit. Past Medical History:  Diagnosis Date  . Abnormal abdominal CT scan 05/14/2019  . Acute ST elevation myocardial infarction (STEMI) of inferolateral wall ([place]) 11/24/2018  . Anemia 05/14/2019  . Anxiety 05/14/2019  . Asthma   . Chronic  bronchitis ([place])    "usually get it q yr" (11/05/2016)  . Chronic diastolic heart failure ([place]) 04/17/2017  . Constipation 05/14/2019  . Coronary artery disease   . Coronary artery disease involving native coronary artery of native heart with unstable angina pectoris ([place])   . Coronary artery disease involving native coronary artery with angina pectoris ([place]) 04/08/2017  . Fecal impaction ([place]) 05/14/2019  . GERD (gastroesophageal reflux disease)   . Headache    "weekly" (11/05/2016)  . Heart murmur    "dx'd when I was a child"  . Hepatitis 1991   "when I had my gallbladder out" (11/05/2016)  . History of blood transfusion 1985   S/P childbirth  . History of kidney stones   . History of peptic ulcer 05/14/2019  . HLD (hyperlipidemia) 11/27/2018  . HTN (hypertension) 05/14/2019  . Hypokalemia 04/17/2017  . Iron deficiency anemia 03/07/2019  . Irritable bowel syndrome 05/14/2019  . LBBB (left bundle branch block) 11/05/2016  . NSTEMI (non-ST elevated myocardial infarction) ([place]) 11/05/2016   9/18 PCI/DESx1 to p/mLAD  . Orthostatic hypotension   . Pancreatitis 1991  . Pericardial effusion 04/08/2017  . Pneumonia    "twice" (11/05/2016)  . 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 Severe mitral regurgitation 05/14/2019  . Syncope 05/14/2019  . Therapeutic opioid induced constipation 05/14/2019  . Unstable angina ([place]) 03/04/2019    Past Surgical History:  Procedure Laterality Date  . ABDOMINAL HYSTERECTOMY  1990  . APPENDECTOMY  1992  . BILE DUCT EXPLORATION  1991   "reconstruction"  . CESAREAN SECTION  1985  . CHOLECYSTECTOMY OPEN  1991  . CORONARY ANGIOPLASTY WITH STENT PLACEMENT  11/05/2016  . CORONARY ARTERY BYPASS GRAFT N/A 03/26/2017   Procedure: CORONARY ARTERY BYPASS GRAFTING (CABG) x 3 WITH ENDOSCOPIC HARVESTING OF RIGHT SAPHENOUS VEIN;  Surgeon: Ivin Poot, MD;  Location: [facility];  Service: Open Heart Surgery;  Laterality: N/A;  . CORONARY STENT INTERVENTION N/A 11/05/2016   Procedure: CORONARY STENT  INTERVENTION;  Surgeon: Martinique, Peter M, MD;  Location: [place] [facility];  Service: Cardiovascular;  Laterality: N/A;  . CYSTOSCOPY WITH STENT PLACEMENT  "3 times" (11/05/2016)  . DILATION AND CURETTAGE OF UTERUS  1989  . INTRAVASCULAR ULTRASOUND/IVUS N/A 03/22/2017   Procedure: Intravascular Ultrasound/IVUS;  Surgeon: Martinique, Peter M, MD;  Location: [place] [facility];  Service: Cardiovascular;  Laterality: N/A;  . KNEE ARTHROSCOPY Right 2000s  . LEFT HEART CATH AND CORONARY ANGIOGRAPHY N/A 11/05/2016   Procedure: LEFT HEART CATH AND CORONARY ANGIOGRAPHY;  Surgeon: Martinique, Peter M, MD;  Location: [place] [facility];  Service: Cardiovascular;  Laterality: N/A;  . LEFT HEART CATH AND CORONARY ANGIOGRAPHY N/A 03/22/2017   Procedure: LEFT HEART CATH AND CORONARY ANGIOGRAPHY;  Surgeon: Martinique, Peter M, MD;  Location: [place] [facility];  Service: Cardiovascular;  Laterality: N/A;  . LEFT HEART CATH AND CORS/GRAFTS ANGIOGRAPHY N/A 03/05/2019   Procedure: LEFT HEART CATH AND CORS/GRAFTS ANGIOGRAPHY;  Surgeon: Leonie Man, MD;  Location: [place] [facility];  Service: Cardiovascular;  Laterality: N/A;  . NASAL SINUS SURGERY  ~ 2009   "exposed to mildew; had to have the infectoin drained; cut the outside of my face"  . OVARIAN CYST  SURGERY  X 6  . RIGHT/LEFT HEART CATH AND CORONARY/GRAFT ANGIOGRAPHY N/A 11/24/2018   Procedure: RIGHT/LEFT HEART CATH AND CORONARY/GRAFT ANGIOGRAPHY;  Surgeon: Leonie Man, MD;  Location: [place] [facility];  Service: Cardiovascular;  Laterality: N/A;  . TEE WITHOUT CARDIOVERSION N/A 03/26/2017   Procedure: TRANSESOPHAGEAL ECHOCARDIOGRAM (TEE);  Surgeon: Prescott Gum, [name], MD;  Location: [facility];  Service: Open Heart Surgery;  Laterality: N/A;    Current Medications: Current Meds  Medication Sig  . acetaminophen (TYLENOL) 325 MG tablet Take 325 mg by mouth every 6 (six) hours as needed for moderate pain or headache.   . albuterol (VENTOLIN HFA) 108 (90 Base) MCG/ACT  inhaler Inhale 2 puffs into the lungs every 6 (six) hours as needed for wheezing or shortness of breath.  Marland Kitchen aspirin EC 81 MG tablet Take 81 mg by mouth daily.  Marland Kitchen atorvastatin (LIPITOR) 80 MG tablet Take 1 tablet (80 mg total) by mouth daily at 6 PM.  . busPIRone (BUSPAR) 15 MG tablet Take 15 mg by mouth 3 (three) times daily.   . clopidogrel (PLAVIX) 75 MG tablet Take 1 tablet (75 mg total) by mouth daily with breakfast.  . furosemide (LASIX) 40 MG tablet Take 1 tablet (40 mg total) by mouth daily.  Marland Kitchen LORazepam (ATIVAN) 0.5 MG tablet Take 0.5 mg by mouth every 8 (eight) hours.  Marland Kitchen LORazepam (ATIVAN) 1 MG tablet Take 1 mg by mouth 2 (two) times daily.   . metoprolol tartrate (LOPRESSOR) 25 MG tablet Take 1 tablet (25 mg total) by mouth 2 (two) times daily.  . midodrine (PROAMATINE) 5 MG tablet Take 1 tablet (5 mg total) by mouth 2 (two) times daily with a meal.  . Multiple Vitamin (MULTIVITAMIN WITH MINERALS) TABS tablet Take 1 tablet by mouth daily.  . nitroGLYCERIN (NITROSTAT) 0.4 MG SL tablet Place 0.4 mg under the tongue every 5 (five) minutes as needed for chest pain.  Marland Kitchen ondansetron (ZOFRAN) 4 MG tablet Take 4 mg by mouth daily as needed for nausea.  Marland Kitchen oxyCODONE (OXY IR/ROXICODONE) 5 MG immediate release tablet Take 5 mg by mouth as needed.  . pantoprazole (PROTONIX) 40 MG tablet Take 40 mg by mouth daily.  . Potassium Chloride ER (K-TAB) 20 MEQ TBCR Take 40 mEq by mouth daily.  . sertraline (ZOLOFT) 25 MG tablet Take 25 mg by mouth daily.  . traZODone (DESYREL) 150 MG tablet Take 300 mg by mouth at bedtime.      Allergies:   Amiodarone, Colchicine, Compazine [prochlorperazine edisylate], Other, and Prochlorperazine   Social History   Socioeconomic History  . Marital status: Divorced    Spouse name: Not on file  . Number of children: Not on file  . Years of education: Not on file  . Highest education level: Not on file  Occupational History  . Not on file  Tobacco Use  . Smoking  status: Never Smoker  . Smokeless tobacco: Never Used  Substance and Sexual Activity  . Alcohol use: Yes    Comment: 11/05/2016 "maybe 1 glass of wine/month"  . Drug use: No  . Sexual activity: Not Currently  Other Topics Concern  . Not on file  Social History Narrative  . Not on file   Social Determinants of Health   Financial Resource Strain:   . Difficulty of Paying Living Expenses:   Food Insecurity:   . Worried About Charity fundraiser in the Last Year:   . [place] in the Last  Year:   Transportation Needs:   . Film/video editor (Medical):   Marland Kitchen Lack of Transportation (Non-Medical):   Physical Activity:   . Days of Exercise per Week:   . Minutes of Exercise per Session:   Stress:   . Feeling of Stress :   Social Connections:   . Frequency of Communication with Friends and Family:   . Frequency of Social Gatherings with Friends and Family:   . Attends Religious Services:   . Active Member of Clubs or Organizations:   . Attends Archivist Meetings:   Marland Kitchen Marital Status:      Family History: The patient's family history includes Alcohol abuse in her father; Diabetes Mellitus II in her father; Heart attack in her father; Hypertension in her father; Suicidality in her mother. ROS:   Please see the history of present illness.    All other systems reviewed and are negative.  EKGs/Labs/Other Studies Reviewed:    The following studies were reviewed today:   Recent Labs: 11/13/2018: NT-Pro BNP 305 03/04/2019: TSH 1.806 03/05/2019: ALT 21; BUN 14; Creatinine, Ser 0.80; Magnesium 1.9; Potassium 3.0; Sodium 139 03/07/2019: Hemoglobin 9.1; Platelets 232  Recent Lipid Panel    Component Value Date/Time   CHOL 133 03/05/2019 0452   TRIG 194 (H) 03/05/2019 0452   HDL 30 (L) 03/05/2019 0452   CHOLHDL 4.4 03/05/2019 0452   VLDL 39 03/05/2019 0452   LDLCALC 64 03/05/2019 0452    Physical Exam:    VS:  BP 110/62   Pulse (!) 57   Ht 5\' 3"  (1.6 m)   Wt  122 lb 12.8 oz (55.7 kg)   SpO2 97%   BMI 21.75 kg/m     Wt Readings from Last 3 Encounters:  05/15/19 122 lb 12.8 oz (55.7 kg)  04/09/19 130 lb (59 kg)  03/19/19 112 lb (50.8 kg)     GEN: Looks markedly improved no pallor of the skin of membranes well nourished, well developed in no acute distress HEENT: Normal NECK: No JVD; No carotid bruits LYMPHATICS: No lymphadenopathy CARDIAC: RRR, no murmurs, rubs, gallops RESPIRATORY:  Clear to auscultation without rales, wheezing or rhonchi  ABDOMEN: Soft, non-tender, non-distended MUSCULOSKELETAL:  No edema; No deformity  SKIN: Warm and dry NEUROLOGIC:  Alert and oriented x 3 PSYCHIATRIC:  Normal affect    Signed, Shirlee More, MD  05/15/2019 9:49 AM    [place]

## 2019-05-15 ENCOUNTER — Encounter: Payer: Self-pay | Admitting: Cardiology

## 2019-05-15 ENCOUNTER — Other Ambulatory Visit: Payer: Self-pay

## 2019-05-15 ENCOUNTER — Ambulatory Visit (INDEPENDENT_AMBULATORY_CARE_PROVIDER_SITE_OTHER): Payer: Self-pay | Admitting: Cardiology

## 2019-05-15 VITALS — BP 110/62 | HR 57 | Ht 63.0 in | Wt 122.8 lb

## 2019-05-15 DIAGNOSIS — E782 Mixed hyperlipidemia: Secondary | ICD-10-CM

## 2019-05-15 DIAGNOSIS — I25708 Atherosclerosis of coronary artery bypass graft(s), unspecified, with other forms of angina pectoris: Secondary | ICD-10-CM

## 2019-05-15 DIAGNOSIS — I5032 Chronic diastolic (congestive) heart failure: Secondary | ICD-10-CM

## 2019-05-15 DIAGNOSIS — E876 Hypokalemia: Secondary | ICD-10-CM

## 2019-05-15 DIAGNOSIS — I950 Idiopathic hypotension: Secondary | ICD-10-CM

## 2019-05-15 DIAGNOSIS — D649 Anemia, unspecified: Secondary | ICD-10-CM

## 2019-05-15 NOTE — Patient Instructions (Signed)

## 2019-06-08 MED FILL — RANOLAZINE ER 1000 MG TB12: 1000 | 30 days supply | Qty: 60 | Fill #3

## 2019-06-11 ENCOUNTER — Telehealth: Payer: Self-pay

## 2019-06-11 MED ORDER — MIDODRINE HCL 10 MG PO TABS: 10.0000 mg | ORAL_TABLET | Freq: Two times a day (BID) | ORAL | 3 refills | Status: DC

## 2019-06-11 MED FILL — MIDODRINE HCL 10 MG TABLET: 10 | 15 days supply | Qty: 30 | Fill #0

## 2019-06-11 NOTE — Telephone Encounter (Signed)
I discussed this patient with Dr. Bettina Gavia and he recommends that we change her midodrine dosage to 10 mg twice daily. I called the patient and discussed this with her and she states that she understands these instructions. I let her know that I sent in this new prescription for her. She verbalizes understanding and does not voice any other issues or concerns.    Encouraged patient to call back with any questions or concerns.

## 2019-06-17 NOTE — Progress Notes (Signed)
Cardiology Office Note:    Date:  06/18/2019   ID:  Gabrielle Page, DOB 24-Nov-1960, MRN [id number]  PCP:  Jaclynn Major, NP  Cardiologist:  Shirlee More, MD    Referring MD: Jaclynn Major, NP    ASSESSMENT:    1. Idiopathic hypotension   2. Coronary artery disease of bypass graft of native heart with stable angina pectoris ([place])   3. Chronic diastolic heart failure ([place])   4. Iron deficiency anemia secondary to inadequate dietary iron intake    PLAN:    In order of problems listed above:  1. Worsened I suspect anemia is the culprit may require IV iron check CBC wearing abdominal binder to help with her blood pressure and increase the dose of midodrine 2. Stable CAD New York Heart Association class I continue current medical therapy 3. Well compensated no edema not taken a loop diuretic 4. I suspect this is today's problem recheck CBC and if hemoglobin is less than 9 I think she should receive parenteral IV iron   Next appointment: 3 months   Medication Adjustments/Labs and Tests Ordered: Current medicines are reviewed at length with the patient today.  Concerns regarding medicines are outlined above.  No orders of the defined types were placed in this encounter.  No orders of the defined types were placed in this encounter.   Chief Complaint  Patient presents with  . Follow-up  . Hypotension    History of Present Illness:    Gabrielle Page is a 59 y.o. female with a hx of CAD (NSTEMI PCI/DES of LAD 10/2016, CABG 03/26/17 with LIMA to LAD, SVG to Diag, and SVG to OM), postoperative PAF/pericardial effusion, HLD, LBBB, diastolic dysfunction  last seen 12/03/2018.She underwent cardiac catheterization 11/24/18 and had a periprocedure Type 4 MI- "occluded occlusion of native OM1 at anastomosis site. Attempted PTCA of 99% lesion, unable to advance, with wire removal there was acute occlusion of downstream OM", occlused SVG-OM and SVG-diagonal, small  LIMA-LAD. No evidence of CHF. She was readmitted to the hospital 03/04/2019 to 03/07/2019 for chest pain her high-sensitivity troponins were normal. Because of ongoing chest pain in hospital she underwent repeat coronary angiography. Major marginal branch that had been occluded during the last PCI attempt was now patent with a 90% stenosis and was felt to be best treated medically. Was difficult to treat angina because of hypotension in hospital she was unable to tolerate beta-blockers and was placed on ranolazine. To increase myocardial perfusion she was placed on alpha agonist midodrine.She was seen 04/09/2019 with severe anemia and decompensated heart failure requiring admission transfusion and diuresis.  Recently she has missed cardiac rehabilitation due to symptomatic hypotension. She was last seen 05/15/2019. Compliance with diet, lifestyle and medications: Yes on her own she no longer takes a diuretic.  She has trouble tolerating iron is only taking it every other day in the past when she was hypotensive is associated with worsening anemia today we will recheck a CBC BMP and she may require parenteral IV iron.  She has had no angina shortness of breath edema or syncope she just feels weak with hypotension and systolics less than 90.  I asked her to wear an abdominal binder to help with hypotension and will increase the dose of her midodrine. Past Medical History:  Diagnosis Date  . Abnormal abdominal CT scan 05/14/2019  . Acute ST elevation myocardial infarction (STEMI) of inferolateral wall ([place]) 11/24/2018  . Anemia 05/14/2019  . Anxiety 05/14/2019  . Asthma   .  Chronic bronchitis ([place])    "usually get it q yr" (11/05/2016)  . Chronic diastolic heart failure ([place]) 04/17/2017  . Constipation 05/14/2019  . Coronary artery disease   . Coronary artery disease involving native coronary artery of native heart with unstable angina pectoris ([place])   . Coronary artery disease involving native coronary artery  with angina pectoris ([place]) 04/08/2017  . Fecal impaction ([place]) 05/14/2019  . GERD (gastroesophageal reflux disease)   . Headache    "weekly" (11/05/2016)  . Heart murmur    "dx'd when I was a child"  . Hepatitis 1991   "when I had my gallbladder out" (11/05/2016)  . History of blood transfusion 1985   S/P childbirth  . History of kidney stones   . History of peptic ulcer 05/14/2019  . HLD (hyperlipidemia) 11/27/2018  . HTN (hypertension) 05/14/2019  . Hypokalemia 04/17/2017  . Iron deficiency anemia 03/07/2019  . Irritable bowel syndrome 05/14/2019  . LBBB (left bundle branch block) 11/05/2016  . NSTEMI (non-ST elevated myocardial infarction) ([place]) 11/05/2016   9/18 PCI/DESx1 to p/mLAD  . Orthostatic hypotension   . Pancreatitis 1991  . Pericardial effusion 04/08/2017  . Pneumonia    "twice" (11/05/2016)  . 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 Severe mitral regurgitation 05/14/2019  . Syncope 05/14/2019  . Therapeutic opioid induced constipation 05/14/2019  . Unstable angina ([place]) 03/04/2019    Past Surgical History:  Procedure Laterality Date  . ABDOMINAL HYSTERECTOMY  1990  . APPENDECTOMY  1992  . BILE DUCT EXPLORATION  1991   "reconstruction"  . CESAREAN SECTION  1985  . CHOLECYSTECTOMY OPEN  1991  . CORONARY ANGIOPLASTY WITH STENT PLACEMENT  11/05/2016  . CORONARY ARTERY BYPASS GRAFT N/A 03/26/2017   Procedure: CORONARY ARTERY BYPASS GRAFTING (CABG) x 3 WITH ENDOSCOPIC HARVESTING OF RIGHT SAPHENOUS VEIN;  Surgeon: Ivin Poot, MD;  Location: [facility];  Service: Open Heart Surgery;  Laterality: N/A;  . CORONARY STENT INTERVENTION N/A 11/05/2016   Procedure: CORONARY STENT INTERVENTION;  Surgeon: Martinique, Peter M, MD;  Location: [facility];  Service: Cardiovascular;  Laterality: N/A;  . CYSTOSCOPY WITH STENT PLACEMENT  "3 times" (11/05/2016)  . DILATION AND CURETTAGE OF UTERUS  1989  . INTRAVASCULAR ULTRASOUND/IVUS N/A 03/22/2017   Procedure: Intravascular Ultrasound/IVUS;  Surgeon: Martinique, Peter  M, MD;  Location: [facility];  Service: Cardiovascular;  Laterality: N/A;  . KNEE ARTHROSCOPY Right 2000s  . LEFT HEART CATH AND CORONARY ANGIOGRAPHY N/A 11/05/2016   Procedure: LEFT HEART CATH AND CORONARY ANGIOGRAPHY;  Surgeon: Martinique, Peter M, MD;  Location: [facility];  Service: Cardiovascular;  Laterality: N/A;  . LEFT HEART CATH AND CORONARY ANGIOGRAPHY N/A 03/22/2017   Procedure: LEFT HEART CATH AND CORONARY ANGIOGRAPHY;  Surgeon: Martinique, Peter M, MD;  Location: [facility];  Service: Cardiovascular;  Laterality: N/A;  . LEFT HEART CATH AND CORS/GRAFTS ANGIOGRAPHY N/A 03/05/2019   Procedure: LEFT HEART CATH AND CORS/GRAFTS ANGIOGRAPHY;  Surgeon: Leonie Man, MD;  Location: [facility];  Service: Cardiovascular;  Laterality: N/A;  . NASAL SINUS SURGERY  ~ 2009   "exposed to mildew; had to have the infectoin drained; cut the outside of my face"  . OVARIAN CYST SURGERY  X 6  . RIGHT/LEFT HEART CATH AND CORONARY/GRAFT ANGIOGRAPHY N/A 11/24/2018   Procedure: RIGHT/LEFT HEART CATH AND CORONARY/GRAFT ANGIOGRAPHY;  Surgeon: Leonie Man, MD;  Location: [facility];  Service: Cardiovascular;  Laterality: N/A;  .  TEE WITHOUT CARDIOVERSION N/A 03/26/2017   Procedure: TRANSESOPHAGEAL ECHOCARDIOGRAM (TEE);  Surgeon: Prescott Gum, [name], MD;  Location: [facility];  Service: Open Heart Surgery;  Laterality: N/A;    Current Medications: Current Meds  Medication Sig  . acetaminophen (TYLENOL) 325 MG tablet Take 325 mg by mouth every 6 (six) hours as needed for moderate pain or headache.   . albuterol (VENTOLIN HFA) 108 (90 Base) MCG/ACT inhaler Inhale 2 puffs into the lungs every 6 (six) hours as needed for wheezing or shortness of breath.  Marland Kitchen aspirin EC 81 MG tablet Take 81 mg by mouth daily.  Marland Kitchen atorvastatin (LIPITOR) 80 MG tablet Take 1 tablet (80 mg total) by mouth daily at 6 PM.  . busPIRone (BUSPAR) 15 MG tablet Take 15 mg by mouth 3 (three) times daily.   .  clopidogrel (PLAVIX) 75 MG tablet Take 1 tablet (75 mg total) by mouth daily with breakfast.  . furosemide (LASIX) 40 MG tablet Take 1 tablet (40 mg total) by mouth daily.  Marland Kitchen LORazepam (ATIVAN) 0.5 MG tablet Take 0.5 mg by mouth every 8 (eight) hours.  . midodrine (PROAMATINE) 10 MG tablet Take 1 tablet (10 mg total) by mouth 2 (two) times daily.  . Multiple Vitamin (MULTIVITAMIN WITH MINERALS) TABS tablet Take 1 tablet by mouth daily.  . nitroGLYCERIN (NITROSTAT) 0.4 MG SL tablet Place 0.4 mg under the tongue every 5 (five) minutes as needed for chest pain.  Marland Kitchen ondansetron (ZOFRAN) 4 MG tablet Take 4 mg by mouth daily as needed for nausea.  . pantoprazole (PROTONIX) 40 MG tablet Take 40 mg by mouth daily.  . Potassium Chloride ER (K-TAB) 20 MEQ TBCR Take 40 mEq by mouth daily.  . sertraline (ZOLOFT) 25 MG tablet Take 25 mg by mouth daily.  . traZODone (DESYREL) 150 MG tablet Take 300 mg by mouth at bedtime.      Allergies:   Amiodarone, Colchicine, Compazine [prochlorperazine edisylate], Other, and Prochlorperazine   Social History   Socioeconomic History  . Marital status: Divorced    Spouse name: Not on file  . Number of children: Not on file  . Years of education: Not on file  . Highest education level: Not on file  Occupational History  . Not on file  Tobacco Use  . Smoking status: Never Smoker  . Smokeless tobacco: Never Used  Substance and Sexual Activity  . Alcohol use: Yes    Comment: 11/05/2016 "maybe 1 glass of wine/month"  . Drug use: No  . Sexual activity: Not Currently  Other Topics Concern  . Not on file  Social History Narrative  . Not on file   Social Determinants of Health   Financial Resource Strain:   . Difficulty of Paying Living Expenses:   Food Insecurity:   . Worried About Charity fundraiser in the Last Year:   . Arboriculturist in the Last Year:   Transportation Needs:   . Film/video editor (Medical):   Marland Kitchen Lack of Transportation  (Non-Medical):   Physical Activity:   . Days of Exercise per Week:   . Minutes of Exercise per Session:   Stress:   . Feeling of Stress :   Social Connections:   . Frequency of Communication with Friends and Family:   . Frequency of Social Gatherings with Friends and Family:   . Attends Religious Services:   . Active Member of Clubs or Organizations:   . Attends Archivist Meetings:   .  Marital Status:      Family History: The patient's family history includes Alcohol abuse in her father; Diabetes Mellitus II in her father; Heart attack in her father; Hypertension in her father; Suicidality in her mother. ROS:   Please see the history of present illness.    All other systems reviewed and are negative.  EKGs/Labs/Other Studies Reviewed:    The following studies were reviewed today:    Recent Labs: 11/13/2018: NT-Pro BNP 305 03/04/2019: TSH 1.806 03/05/2019: ALT 21; BUN 14; Creatinine, Ser 0.80; Magnesium 1.9; Potassium 3.0; Sodium 139 03/07/2019: Hemoglobin 9.1; Platelets 232  Recent Lipid Panel    Component Value Date/Time   CHOL 133 03/05/2019 0452   TRIG 194 (H) 03/05/2019 0452   HDL 30 (L) 03/05/2019 0452   CHOLHDL 4.4 03/05/2019 0452   VLDL 39 03/05/2019 0452   LDLCALC 64 03/05/2019 0452    Physical Exam:    VS:  BP (!) 84/58   Pulse 67   Temp 98.3 F (36.8 C)   Ht 5\' 3"  (1.6 m)   Wt 125 lb 3.2 oz (56.8 kg)   SpO2 97%   BMI 22.18 kg/m     Wt Readings from Last 3 Encounters:  06/18/19 125 lb 3.2 oz (56.8 kg)  05/15/19 122 lb 12.8 oz (55.7 kg)  04/09/19 130 lb (59 kg)     GEN: She has pallor of membranes and the skin well nourished, well developed in no acute distress HEENT: Normal NECK: No JVD; No carotid bruits LYMPHATICS: No lymphadenopathy CARDIAC: RRR, no murmurs, rubs, gallops RESPIRATORY:  Clear to auscultation without rales, wheezing or rhonchi  ABDOMEN: Soft, non-tender, non-distended MUSCULOSKELETAL:  No edema; No deformity  SKIN:  Warm and dry NEUROLOGIC:  Alert and oriented x 3 PSYCHIATRIC:  Normal affect    Signed, Shirlee More, MD  06/18/2019 10:59 AM    [place]

## 2019-06-18 ENCOUNTER — Ambulatory Visit (INDEPENDENT_AMBULATORY_CARE_PROVIDER_SITE_OTHER): Payer: Self-pay | Admitting: Cardiology

## 2019-06-18 ENCOUNTER — Other Ambulatory Visit: Payer: Self-pay

## 2019-06-18 ENCOUNTER — Encounter: Payer: Self-pay | Admitting: Cardiology

## 2019-06-18 VITALS — BP 84/58 | HR 67 | Temp 98.3°F | Ht 63.0 in | Wt 125.2 lb

## 2019-06-18 DIAGNOSIS — D508 Other iron deficiency anemias: Secondary | ICD-10-CM

## 2019-06-18 DIAGNOSIS — I950 Idiopathic hypotension: Secondary | ICD-10-CM

## 2019-06-18 DIAGNOSIS — I5032 Chronic diastolic (congestive) heart failure: Secondary | ICD-10-CM

## 2019-06-18 DIAGNOSIS — I25708 Atherosclerosis of coronary artery bypass graft(s), unspecified, with other forms of angina pectoris: Secondary | ICD-10-CM

## 2019-06-18 MED ORDER — MIDODRINE HCL 10 MG PO TABS: 10.0000 mg | ORAL_TABLET | Freq: Three times a day (TID) | ORAL | 3 refills | Status: DC

## 2019-06-18 NOTE — Patient Instructions (Signed)
Medication Instructions:  Your physician has recommended you make the following change in your medication:  INCREASE: Midodrine 10 mg take one tablet by mouth three times daily.  *If you need a refill on your cardiac medications before your next appointment, please call your pharmacy*   Lab Work: Your physician recommends that you return for lab work in: TODAY CBC, BMP If you have labs (blood work) drawn today and your tests are completely normal, you will receive your results only by: Marland Kitchen MyChart Message (if you have MyChart) OR . A paper copy in the mail If you have any lab test that is abnormal or we need to change your treatment, we will call you to review the results.   Testing/Procedures: None   Follow-Up: At [facility], you and your health needs are our priority.  As part of our continuing mission to provide you with exceptional heart care, we have created designated Provider Care Teams.  These Care Teams include your primary Cardiologist (physician) and Advanced Practice Providers (APPs -  Physician Assistants and Nurse Practitioners) who all work together to provide you with the care you need, when you need it.  We recommend signing up for the patient portal called "MyChart".  Sign up information is provided on this After Visit Summary.  MyChart is used to connect with patients for Virtual Visits (Telemedicine).  Patients are able to view lab/test results, encounter notes, upcoming appointments, etc.  Non-urgent messages can be sent to your provider as well.   To learn more about what you can do with MyChart, go to [email or web address].    Your next appointment:   3 month(s)  The format for your next appointment:   In Person  Provider:   Shirlee More, MD   Other Instructions

## 2019-06-19 ENCOUNTER — Telehealth: Payer: Self-pay

## 2019-06-19 LAB — CBC
Hematocrit: 32.5 % — ABNORMAL LOW (ref 34.0–46.6)
Hemoglobin: 11.1 g/dL (ref 11.1–15.9)
MCH: 29.5 pg (ref 26.6–33.0)
MCHC: 34.2 g/dL (ref 31.5–35.7)
MCV: 86 fL (ref 79–97)
Platelets: 282 10*3/uL (ref 150–450)
RBC: 3.76 x10E6/uL — ABNORMAL LOW (ref 3.77–5.28)
RDW: 14.3 % (ref 11.7–15.4)
WBC: 4.3 10*3/uL (ref 3.4–10.8)

## 2019-06-19 LAB — BASIC METABOLIC PANEL
BUN/Creatinine Ratio: 29 — ABNORMAL HIGH (ref 9–23)
BUN: 27 mg/dL — ABNORMAL HIGH (ref 6–24)
CO2: 22 mmol/L (ref 20–29)
Calcium: 10.2 mg/dL (ref 8.7–10.2)
Chloride: 104 mmol/L (ref 96–106)
Creatinine, Ser: 0.92 mg/dL (ref 0.57–1.00)
GFR calc Af Amer: 80 mL/min/{1.73_m2} (ref >59)
GFR calc non Af Amer: 70 mL/min/{1.73_m2} (ref >59)
Glucose: 97 mg/dL (ref 65–99)
Potassium: 4.6 mmol/L (ref 3.5–5.2)
Sodium: 139 mmol/L (ref 134–144)

## 2019-06-19 NOTE — Telephone Encounter (Signed)
-----   Message from Richardo Priest, MD sent at 06/19/2019  7:42 AM EDT ----- Normal or stable result  Her labs are surprisingly good hemoglobin is much better no changes in medications

## 2019-06-19 NOTE — Telephone Encounter (Signed)
Spoke with patient regarding results and recommendation.  Patient verbalizes understanding and is agreeable to plan of care. Advised patient to call back with any issues or concerns.  

## 2019-08-03 MED FILL — RANOLAZINE ER 1000 MG TB12: 1000 | 30 days supply | Qty: 60 | Fill #4

## 2019-08-18 MED FILL — PANTOPRAZOLE SOD DR 40 MG T: 40 | 30 days supply | Qty: 60 | Fill #0

## 2019-09-02 MED FILL — RANOLAZINE ER 1000 MG TB12: 1000 | 30 days supply | Qty: 60 | Fill #5

## 2019-09-03 ENCOUNTER — Other Ambulatory Visit: Payer: Self-pay | Admitting: Cardiology

## 2019-09-21 MED FILL — PANTOPRAZOLE SOD DR 40 MG T: 40 | 30 days supply | Qty: 60 | Fill #1

## 2019-09-25 MED FILL — !LINZESS 290 MCG CAPSULE: 290 | 30 days supply | Qty: 30 | Fill #0

## 2019-09-29 ENCOUNTER — Ambulatory Visit: Payer: Medicaid Other | Admitting: Cardiology

## 2019-10-05 MED FILL — !LINZESS 290 MCG CAPSULE: 290 | 30 days supply | Qty: 30 | Fill #0

## 2019-10-05 MED FILL — RANOLAZINE ER 1000 MG TB12: 1000 | 30 days supply | Qty: 60 | Fill #6

## 2019-10-14 DIAGNOSIS — J45909 Unspecified asthma, uncomplicated: Secondary | ICD-10-CM | POA: Insufficient documentation

## 2019-10-14 DIAGNOSIS — R011 Cardiac murmur, unspecified: Secondary | ICD-10-CM | POA: Insufficient documentation

## 2019-10-14 DIAGNOSIS — R519 Headache, unspecified: Secondary | ICD-10-CM | POA: Insufficient documentation

## 2019-10-14 DIAGNOSIS — I2510 Atherosclerotic heart disease of native coronary artery without angina pectoris: Secondary | ICD-10-CM | POA: Insufficient documentation

## 2019-10-14 DIAGNOSIS — J189 Pneumonia, unspecified organism: Secondary | ICD-10-CM | POA: Insufficient documentation

## 2019-10-14 DIAGNOSIS — J42 Unspecified chronic bronchitis: Secondary | ICD-10-CM | POA: Insufficient documentation

## 2019-10-14 DIAGNOSIS — Z87442 Personal history of urinary calculi: Secondary | ICD-10-CM | POA: Insufficient documentation

## 2019-10-15 NOTE — Progress Notes (Deleted)
Cardiology Office Note:    Date:  10/15/2019   ID:  Gabrielle Page, DOB 08-Mar-1960, MRN [id number]  PCP:  Jaclynn Major, NP  Cardiologist:  Shirlee More, MD    Referring MD: Jaclynn Major, NP    ASSESSMENT:    No diagnosis found. PLAN:    In order of problems listed above:  1. ***   Next appointment: ***   Medication Adjustments/Labs and Tests Ordered: Current medicines are reviewed at length with the patient today.  Concerns regarding medicines are outlined above.  No orders of the defined types were placed in this encounter.  No orders of the defined types were placed in this encounter.   No chief complaint on file.   History of Present Illness:    Gabrielle Page is a 59 y.o. female with a hx of CAD CABG PCI 11/24/2018 LV dysfunction heart failure and ongoing problem of iron deficiency anemia requiring transfusion and idiopathic hypotension with falls and trauma last seen 06/18/2019. Compliance with diet, lifestyle and medications: *** Past Medical History:  Diagnosis Date  . Abnormal abdominal CT scan 05/14/2019  . Acute ST elevation myocardial infarction (STEMI) of inferolateral wall ([place]) 11/24/2018  . Anemia 05/14/2019  . Anxiety 05/14/2019  . Asthma   . Chronic bronchitis ([place])    "usually get it q yr" (11/05/2016)  . Chronic diastolic heart failure ([place]) 04/17/2017  . Constipation 05/14/2019  . Coronary artery disease   . Coronary artery disease involving native coronary artery of native heart with unstable angina pectoris ([place])   . Coronary artery disease involving native coronary artery with angina pectoris ([place]) 04/08/2017  . Fecal impaction ([place]) 05/14/2019  . GERD (gastroesophageal reflux disease)   . Headache    "weekly" (11/05/2016)  . Heart murmur    "dx'd when I was a child"  . Hepatitis 1991   "when I had my gallbladder out" (11/05/2016)  . History of blood transfusion 1985   S/P childbirth  . History of kidney stones   . History  of peptic ulcer 05/14/2019  . HLD (hyperlipidemia) 11/27/2018  . HTN (hypertension) 05/14/2019  . Hypokalemia 04/17/2017  . Iron deficiency anemia 03/07/2019  . Irritable bowel syndrome 05/14/2019  . LBBB (left bundle branch block) 11/05/2016  . NSTEMI (non-ST elevated myocardial infarction) ([place]) 11/05/2016   9/18 PCI/DESx1 to p/mLAD  . Orthostatic hypotension   . Pancreatitis 1991  . Pericardial effusion 04/08/2017  . Pneumonia    "twice" (11/05/2016)  . 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 Severe mitral regurgitation 05/14/2019  . Syncope 05/14/2019  . Therapeutic opioid induced constipation 05/14/2019  . Unstable angina ([place]) 03/04/2019    Past Surgical History:  Procedure Laterality Date  . ABDOMINAL HYSTERECTOMY  1990  . APPENDECTOMY  1992  . BILE DUCT EXPLORATION  1991   "reconstruction"  . CESAREAN SECTION  1985  . CHOLECYSTECTOMY OPEN  1991  . CORONARY ANGIOPLASTY WITH STENT PLACEMENT  11/05/2016  . CORONARY ARTERY BYPASS GRAFT N/A 03/26/2017   Procedure: CORONARY ARTERY BYPASS GRAFTING (CABG) x 3 WITH ENDOSCOPIC HARVESTING OF RIGHT SAPHENOUS VEIN;  Surgeon: Ivin Poot, MD;  Location: [facility];  Service: Open Heart Surgery;  Laterality: N/A;  . CORONARY STENT INTERVENTION N/A 11/05/2016   Procedure: CORONARY STENT INTERVENTION;  Surgeon: Martinique, Peter M, MD;  Location: [facility];  Service: Cardiovascular;  Laterality: N/A;  . CYSTOSCOPY WITH STENT PLACEMENT  "3 times" (11/05/2016)  . DILATION AND CURETTAGE OF UTERUS  1989  . INTRAVASCULAR  ULTRASOUND/IVUS N/A 03/22/2017   Procedure: Intravascular Ultrasound/IVUS;  Surgeon: Martinique, Peter M, MD;  Location: [facility];  Service: Cardiovascular;  Laterality: N/A;  . KNEE ARTHROSCOPY Right 2000s  . LEFT HEART CATH AND CORONARY ANGIOGRAPHY N/A 11/05/2016   Procedure: LEFT HEART CATH AND CORONARY ANGIOGRAPHY;  Surgeon: Martinique, Peter M, MD;  Location: [facility];  Service: Cardiovascular;  Laterality: N/A;  . LEFT HEART CATH AND  CORONARY ANGIOGRAPHY N/A 03/22/2017   Procedure: LEFT HEART CATH AND CORONARY ANGIOGRAPHY;  Surgeon: Martinique, Peter M, MD;  Location: [facility];  Service: Cardiovascular;  Laterality: N/A;  . LEFT HEART CATH AND CORS/GRAFTS ANGIOGRAPHY N/A 03/05/2019   Procedure: LEFT HEART CATH AND CORS/GRAFTS ANGIOGRAPHY;  Surgeon: Leonie Man, MD;  Location: [facility];  Service: Cardiovascular;  Laterality: N/A;  . NASAL SINUS SURGERY  ~ 2009   "exposed to mildew; had to have the infectoin drained; cut the outside of my face"  . OVARIAN CYST SURGERY  X 6  . RIGHT/LEFT HEART CATH AND CORONARY/GRAFT ANGIOGRAPHY N/A 11/24/2018   Procedure: RIGHT/LEFT HEART CATH AND CORONARY/GRAFT ANGIOGRAPHY;  Surgeon: Leonie Man, MD;  Location: [facility];  Service: Cardiovascular;  Laterality: N/A;  . TEE WITHOUT CARDIOVERSION N/A 03/26/2017   Procedure: TRANSESOPHAGEAL ECHOCARDIOGRAM (TEE);  Surgeon: Prescott Gum, [name], MD;  Location: [facility];  Service: Open Heart Surgery;  Laterality: N/A;    Current Medications: No outpatient medications have been marked as taking for the 10/16/19 encounter (Appointment) with Richardo Priest, MD.     Allergies:   Amiodarone, Colchicine, Compazine [prochlorperazine edisylate], Other, and Prochlorperazine   Social History   Socioeconomic History  . Marital status: Divorced    Spouse name: Not on file  . Number of children: Not on file  . Years of education: Not on file  . Highest education level: Not on file  Occupational History  . Not on file  Tobacco Use  . Smoking status: Never Smoker  . Smokeless tobacco: Never Used  Vaping Use  . Vaping Use: Never used  Substance and Sexual Activity  . Alcohol use: Yes    Comment: 11/05/2016 "maybe 1 glass of wine/month"  . Drug use: No  . Sexual activity: Not Currently  Other Topics Concern  . Not on file  Social History Narrative  . Not on file   Social Determinants of Health   Financial Resource  Strain:   . Difficulty of Paying Living Expenses: Not on file  Food Insecurity:   . Worried About Charity fundraiser in the Last Year: Not on file  . Ran Out of Food in the Last Year: Not on file  Transportation Needs:   . Lack of Transportation (Medical): Not on file  . Lack of Transportation (Non-Medical): Not on file  Physical Activity:   . Days of Exercise per Week: Not on file  . Minutes of Exercise per Session: Not on file  Stress:   . Feeling of Stress : Not on file  Social Connections:   . Frequency of Communication with Friends and Family: Not on file  . Frequency of Social Gatherings with Friends and Family: Not on file  . Attends Religious Services: Not on file  . Active Member of Clubs or Organizations: Not on file  . Attends Archivist Meetings: Not on file  . Marital Status: Not on file     Family History: The patient's ***family history includes Alcohol abuse  in her father; Diabetes Mellitus II in her father; Heart attack in her father; Hypertension in her father; Suicidality in her mother. ROS:   Please see the history of present illness.    All other systems reviewed and are negative.  EKGs/Labs/Other Studies Reviewed:    The following studies were reviewed today:  EKG:  EKG ordered today and personally reviewed.  The ekg ordered today demonstrates ***  Recent Labs: 11/13/2018: NT-Pro BNP 305 03/04/2019: TSH 1.806 03/05/2019: ALT 21; Magnesium 1.9 06/18/2019: BUN 27; Creatinine, Ser 0.92; Hemoglobin 11.1; Platelets 282; Potassium 4.6; Sodium 139  Recent Lipid Panel    Component Value Date/Time   CHOL 133 03/05/2019 0452   TRIG 194 (H) 03/05/2019 0452   HDL 30 (L) 03/05/2019 0452   CHOLHDL 4.4 03/05/2019 0452   VLDL 39 03/05/2019 0452   LDLCALC 64 03/05/2019 0452    Physical Exam:    VS:  There were no vitals taken for this visit.    Wt Readings from Last 3 Encounters:  06/18/19 125 lb 3.2 oz (56.8 kg)  05/15/19 122 lb 12.8 oz (55.7 kg)    04/09/19 130 lb (59 kg)     GEN: *** Well nourished, well developed in no acute distress HEENT: Normal NECK: No JVD; No carotid bruits LYMPHATICS: No lymphadenopathy CARDIAC: ***RRR, no murmurs, rubs, gallops RESPIRATORY:  Clear to auscultation without rales, wheezing or rhonchi  ABDOMEN: Soft, non-tender, non-distended MUSCULOSKELETAL:  No edema; No deformity  SKIN: Warm and dry NEUROLOGIC:  Alert and oriented x 3 PSYCHIATRIC:  Normal affect    Signed, Shirlee More, MD  10/15/2019 7:34 AM    [place]

## 2019-10-16 ENCOUNTER — Ambulatory Visit: Payer: Self-pay | Admitting: Cardiology

## 2019-11-04 MED FILL — PANTOPRAZOLE SOD DR 40 MG T: 40 | 30 days supply | Qty: 60 | Fill #2

## 2019-11-04 MED FILL — !LINZESS 290 MCG CAPSULE: 290 | 30 days supply | Qty: 30 | Fill #1

## 2019-11-04 MED FILL — RANOLAZINE ER 1000 MG TB12: 1000 | 30 days supply | Qty: 60 | Fill #7

## 2019-11-26 ENCOUNTER — Encounter: Payer: Self-pay | Admitting: Neurology

## 2019-12-11 ENCOUNTER — Telehealth: Payer: Self-pay | Admitting: Cardiology

## 2019-12-11 NOTE — Telephone Encounter (Signed)
Spoke to the patient just now and let her know that I believe the missed call she got was just to remind her of her upcoming appointment on Monday. She verbalizes understanding and states that she will be there.

## 2019-12-11 NOTE — Telephone Encounter (Signed)
Patient was returning phone call 

## 2019-12-13 NOTE — Progress Notes (Signed)
Cardiology Office Note:    Date:  12/14/2019   ID:  Gabrielle Page, DOB 11/26/1960, MRN [id number]  PCP:  Jaclynn Major, NP  Cardiologist:  Shirlee More, MD    Referring MD: Jaclynn Major, NP    ASSESSMENT:    1. Idiopathic hypotension   2. Chronic diastolic heart failure ([place])   3. Coronary artery disease of bypass graft of native heart with stable angina pectoris ([place])   4. Iron deficiency anemia secondary to inadequate dietary iron intake   5. Mixed hyperlipidemia    PLAN:    In order of problems listed above:  1. Reduce diuretic 50% increase her alpha agonist. 2. Stable compensated no evidence of fluid overload 3. Stable CAD medical therapy including dual antiplatelet beta-blocker and statin.  Having no angina on current therapy 4. Check CBC resume over-the-counter iron 5. Continue statin check lipid profile CMP   Next appointment: 6 months   Medication Adjustments/Labs and Tests Ordered: Current medicines are reviewed at length with the patient today.  Concerns regarding medicines are outlined above.  No orders of the defined types were placed in this encounter.  No orders of the defined types were placed in this encounter.   Chief Complaint  Patient presents with  . Follow-up  . Coronary Artery Disease  . Hypotension  . Anemia    History of Present Illness:    Gabrielle Page is a 59 y.o. female with a hx of CABG with vein graft occlusion paroxysmal atrial fibrillation hyperlipidemia left bundle branch block anemia and heart failure.  She was last seen 06/18/2019 with improvement in her idiopathic hypotension and anemia.. Compliance with diet, lifestyle and medications: Yes  76 weeks ago she was lightheaded and fell and has vertebral compression fractures.  She is in physical therapy.  Fortunately she is not having edema no orthopnea chest pain palpitation or syncope.  Her blood pressure is low my office today we will increase her  alpha agonist and decrease her loop diuretic dosage.  She is not taking iron supplements she has had severe symptomatic anemia in the past is not taking iron supplement we will check a CBC and put her back on children's chewable vitamin over-the-counter with iron as she is poorly iron tolerating.  She has had no angina. Past Medical History:  Diagnosis Date  . Abnormal abdominal CT scan 05/14/2019  . Acute ST elevation myocardial infarction (STEMI) of inferolateral wall ([place]) 11/24/2018  . Anemia 05/14/2019  . Anxiety 05/14/2019  . Asthma   . Chronic bronchitis ([place])    "usually get it q yr" (11/05/2016)  . Chronic diastolic heart failure ([place]) 04/17/2017  . Constipation 05/14/2019  . Coronary artery disease   . Coronary artery disease involving native coronary artery of native heart with unstable angina pectoris ([place])   . Coronary artery disease involving native coronary artery with angina pectoris ([place]) 04/08/2017  . Fecal impaction ([place]) 05/14/2019  . GERD (gastroesophageal reflux disease)   . Headache    "weekly" (11/05/2016)  . Heart murmur    "dx'd when I was a child"  . Hepatitis 1991   "when I had my gallbladder out" (11/05/2016)  . History of blood transfusion 1985   S/P childbirth  . History of kidney stones   . History of peptic ulcer 05/14/2019  . HLD (hyperlipidemia) 11/27/2018  . HTN (hypertension) 05/14/2019  . Hypokalemia 04/17/2017  . Iron deficiency anemia 03/07/2019  . Irritable bowel syndrome 05/14/2019  . LBBB (left bundle branch block) 11/05/2016  .  NSTEMI (non-ST elevated myocardial infarction) ([place]) 11/05/2016   9/18 PCI/DESx1 to p/mLAD  . Orthostatic hypotension   . Pancreatitis 1991  . Pericardial effusion 04/08/2017  . Pneumonia    "twice" (11/05/2016)  . 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 Severe mitral regurgitation 05/14/2019  . Syncope 05/14/2019  . Therapeutic opioid induced constipation 05/14/2019  . Unstable angina ([place]) 03/04/2019    Past Surgical History:  Procedure Laterality  Date  . ABDOMINAL HYSTERECTOMY  1990  . APPENDECTOMY  1992  . BILE DUCT EXPLORATION  1991   "reconstruction"  . CESAREAN SECTION  1985  . CHOLECYSTECTOMY OPEN  1991  . CORONARY ANGIOPLASTY WITH STENT PLACEMENT  11/05/2016  . CORONARY ARTERY BYPASS GRAFT N/A 03/26/2017   Procedure: CORONARY ARTERY BYPASS GRAFTING (CABG) x 3 WITH ENDOSCOPIC HARVESTING OF RIGHT SAPHENOUS VEIN;  Surgeon: Ivin Poot, MD;  Location: [facility];  Service: Open Heart Surgery;  Laterality: N/A;  . CORONARY STENT INTERVENTION N/A 11/05/2016   Procedure: CORONARY STENT INTERVENTION;  Surgeon: Martinique, Peter M, MD;  Location: [facility];  Service: Cardiovascular;  Laterality: N/A;  . CYSTOSCOPY WITH STENT PLACEMENT  "3 times" (11/05/2016)  . DILATION AND CURETTAGE OF UTERUS  1989  . INTRAVASCULAR ULTRASOUND/IVUS N/A 03/22/2017   Procedure: Intravascular Ultrasound/IVUS;  Surgeon: Martinique, Peter M, MD;  Location: [facility];  Service: Cardiovascular;  Laterality: N/A;  . KNEE ARTHROSCOPY Right 2000s  . LEFT HEART CATH AND CORONARY ANGIOGRAPHY N/A 11/05/2016   Procedure: LEFT HEART CATH AND CORONARY ANGIOGRAPHY;  Surgeon: Martinique, Peter M, MD;  Location: [facility];  Service: Cardiovascular;  Laterality: N/A;  . LEFT HEART CATH AND CORONARY ANGIOGRAPHY N/A 03/22/2017   Procedure: LEFT HEART CATH AND CORONARY ANGIOGRAPHY;  Surgeon: Martinique, Peter M, MD;  Location: [facility];  Service: Cardiovascular;  Laterality: N/A;  . LEFT HEART CATH AND CORS/GRAFTS ANGIOGRAPHY N/A 03/05/2019   Procedure: LEFT HEART CATH AND CORS/GRAFTS ANGIOGRAPHY;  Surgeon: Leonie Man, MD;  Location: [facility];  Service: Cardiovascular;  Laterality: N/A;  . NASAL SINUS SURGERY  ~ 2009   "exposed to mildew; had to have the infectoin drained; cut the outside of my face"  . OVARIAN CYST SURGERY  X 6  . RIGHT/LEFT HEART CATH AND CORONARY/GRAFT ANGIOGRAPHY N/A 11/24/2018   Procedure: RIGHT/LEFT HEART CATH AND  CORONARY/GRAFT ANGIOGRAPHY;  Surgeon: Leonie Man, MD;  Location: [facility];  Service: Cardiovascular;  Laterality: N/A;  . TEE WITHOUT CARDIOVERSION N/A 03/26/2017   Procedure: TRANSESOPHAGEAL ECHOCARDIOGRAM (TEE);  Surgeon: Prescott Gum, [name], MD;  Location: [facility];  Service: Open Heart Surgery;  Laterality: N/A;    Current Medications: Current Meds  Medication Sig  . acetaminophen (TYLENOL) 325 MG tablet Take 325 mg by mouth every 6 (six) hours as needed for moderate pain or headache.   . albuterol (VENTOLIN HFA) 108 (90 Base) MCG/ACT inhaler Inhale 2 puffs into the lungs every 6 (six) hours as needed for wheezing or shortness of breath.  Marland Kitchen aspirin EC 81 MG tablet Take 81 mg by mouth daily.  Marland Kitchen atorvastatin (LIPITOR) 80 MG tablet Take 1 tablet (80 mg total) by mouth daily at 6 PM.  . busPIRone (BUSPAR) 15 MG tablet Take 15 mg by mouth 3 (three) times daily.   . clopidogrel (PLAVIX) 75 MG tablet Take 1 tablet (75 mg total) by mouth daily with breakfast.  . furosemide (LASIX) 40 MG tablet Take 1 tablet by mouth  once daily  . LINZESS 290 MCG CAPS capsule Take 290 mcg by mouth daily.  Marland Kitchen LORazepam (ATIVAN) 0.5 MG tablet Take 0.5 mg by mouth every 8 (eight) hours.  . metoprolol tartrate (LOPRESSOR) 25 MG tablet Take 1 tablet (25 mg total) by mouth 2 (two) times daily.  . midodrine (PROAMATINE) 10 MG tablet Take 1 tablet (10 mg total) by mouth 3 (three) times daily.  . Multiple Vitamin (MULTIVITAMIN WITH MINERALS) TABS tablet Take 1 tablet by mouth daily.  . nitroGLYCERIN (NITROSTAT) 0.4 MG SL tablet Place 0.4 mg under the tongue every 5 (five) minutes as needed for chest pain.  Marland Kitchen ondansetron (ZOFRAN) 4 MG tablet Take 4 mg by mouth daily as needed for nausea.  . Oxycodone HCl 10 MG TABS Take 10 mg by mouth 3 (three) times daily as needed.  . pantoprazole (PROTONIX) 40 MG tablet Take 40 mg by mouth daily.  . potassium chloride SA (KLOR-CON) 20 MEQ tablet Take 20 mEq by mouth daily as  needed.  . ranolazine (RANEXA) 1000 MG SR tablet Take 1,000 mg by mouth 2 (two) times daily.  . sertraline (ZOLOFT) 50 MG tablet Take 50 mg by mouth daily.  . traZODone (DESYREL) 150 MG tablet Take 300 mg by mouth at bedtime.      Allergies:   Amiodarone, Colchicine, Compazine [prochlorperazine edisylate], Other, and Prochlorperazine   Social History   Socioeconomic History  . Marital status: Divorced    Spouse name: Not on file  . Number of children: Not on file  . Years of education: Not on file  . Highest education level: Not on file  Occupational History  . Not on file  Tobacco Use  . Smoking status: Never Smoker  . Smokeless tobacco: Never Used  Vaping Use  . Vaping Use: Never used  Substance and Sexual Activity  . Alcohol use: Yes    Comment: 11/05/2016 "maybe 1 glass of wine/month"  . Drug use: No  . Sexual activity: Not Currently  Other Topics Concern  . Not on file  Social History Narrative  . Not on file   Social Determinants of Health   Financial Resource Strain:   . Difficulty of Paying Living Expenses: Not on file  Food Insecurity:   . Worried About Charity fundraiser in the Last Year: Not on file  . Ran Out of Food in the Last Year: Not on file  Transportation Needs:   . Lack of Transportation (Medical): Not on file  . Lack of Transportation (Non-Medical): Not on file  Physical Activity:   . Days of Exercise per Week: Not on file  . Minutes of Exercise per Session: Not on file  Stress:   . Feeling of Stress : Not on file  Social Connections:   . Frequency of Communication with Friends and Family: Not on file  . Frequency of Social Gatherings with Friends and Family: Not on file  . Attends Religious Services: Not on file  . Active Member of Clubs or Organizations: Not on file  . Attends Archivist Meetings: Not on file  . Marital Status: Not on file     Family History: The patient's family history includes Alcohol abuse in her father;  Diabetes Mellitus II in her father; Heart attack in her father; Hypertension in her father; Suicidality in her mother. ROS:   Please see the history of present illness.    All other systems reviewed and are negative.  EKGs/Labs/Other Studies Reviewed:  The following studies were reviewed today:    Recent Labs: 03/04/2019: TSH 1.806 03/05/2019: ALT 21; Magnesium 1.9 06/18/2019: BUN 27; Creatinine, Ser 0.92; Hemoglobin 11.1; Platelets 282; Potassium 4.6; Sodium 139  Recent Lipid Panel    Component Value Date/Time   CHOL 133 03/05/2019 0452   TRIG 194 (H) 03/05/2019 0452   HDL 30 (L) 03/05/2019 0452   CHOLHDL 4.4 03/05/2019 0452   VLDL 39 03/05/2019 0452   LDLCALC 64 03/05/2019 0452    Physical Exam:    VS:  BP (!) 92/58   Pulse 74   Ht 5\' 3"  (1.6 m)   Wt 125 lb 3.2 oz (56.8 kg)   SpO2 99%   BMI 22.18 kg/m     Wt Readings from Last 3 Encounters:  12/14/19 125 lb 3.2 oz (56.8 kg)  06/18/19 125 lb 3.2 oz (56.8 kg)  05/15/19 122 lb 12.8 oz (55.7 kg)     GEN:  Well nourished, well developed in no acute distress HEENT: Normal NECK: No JVD; No carotid bruits LYMPHATICS: No lymphadenopathy CARDIAC: RRR, no murmurs, rubs, gallops RESPIRATORY:  Clear to auscultation without rales, wheezing or rhonchi  ABDOMEN: Soft, non-tender, non-distended MUSCULOSKELETAL:  No edema; No deformity  SKIN: Warm and dry she has no pallor of the skin or membranes NEUROLOGIC:  Alert and oriented x 3 PSYCHIATRIC:  Normal affect    Signed, Shirlee More, MD  12/14/2019 4:24 PM    [place]

## 2019-12-14 ENCOUNTER — Other Ambulatory Visit: Payer: Self-pay

## 2019-12-14 ENCOUNTER — Encounter: Payer: Self-pay | Admitting: Cardiology

## 2019-12-14 ENCOUNTER — Ambulatory Visit (INDEPENDENT_AMBULATORY_CARE_PROVIDER_SITE_OTHER): Payer: Medicaid Other | Admitting: Cardiology

## 2019-12-14 VITALS — BP 92/58 | HR 74 | Ht 63.0 in | Wt 125.2 lb

## 2019-12-14 DIAGNOSIS — I25708 Atherosclerosis of coronary artery bypass graft(s), unspecified, with other forms of angina pectoris: Secondary | ICD-10-CM | POA: Diagnosis not present

## 2019-12-14 DIAGNOSIS — I950 Idiopathic hypotension: Secondary | ICD-10-CM

## 2019-12-14 DIAGNOSIS — I5032 Chronic diastolic (congestive) heart failure: Secondary | ICD-10-CM | POA: Diagnosis not present

## 2019-12-14 DIAGNOSIS — D508 Other iron deficiency anemias: Secondary | ICD-10-CM

## 2019-12-14 DIAGNOSIS — E782 Mixed hyperlipidemia: Secondary | ICD-10-CM

## 2019-12-14 MED ORDER — MIDODRINE HCL 10 MG PO TABS: 10.0000 mg | ORAL_TABLET | Freq: Four times a day (QID) | ORAL | 3 refills | Status: DC

## 2019-12-14 MED ORDER — FUROSEMIDE 40 MG PO TABS: 40.0000 mg | ORAL_TABLET | ORAL | 3 refills | Status: DC

## 2019-12-14 NOTE — Patient Instructions (Addendum)
Medication Instructions:  Your physician has recommended you make the following change in your medication:  DECREASE: Furosemide 40 mg take one tablet by mouth three times weekly INCREASE: Midodrine 10 mg take one tablet by mouth four times daily.   Please take an OTC Childrens chewable multivitamin daily.   *If you need a refill on your cardiac medications before your next appointment, please call your pharmacy*   Lab Work: Your physician recommends that you return for lab work in: TODAY CBC, CMP, Lipids If you have labs (blood work) drawn today and your tests are completely normal, you will receive your results only by: Marland Kitchen MyChart Message (if you have MyChart) OR . A paper copy in the mail If you have any lab test that is abnormal or we need to change your treatment, we will call you to review the results.   Testing/Procedures: None   Follow-Up: At [facility], you and your health needs are our priority.  As part of our continuing mission to provide you with exceptional heart care, we have created designated Provider Care Teams.  These Care Teams include your primary Cardiologist (physician) and Advanced Practice Providers (APPs -  Physician Assistants and Nurse Practitioners) who all work together to provide you with the care you need, when you need it.  We recommend signing up for the patient portal called "MyChart".  Sign up information is provided on this After Visit Summary.  MyChart is used to connect with patients for Virtual Visits (Telemedicine).  Patients are able to view lab/test results, encounter notes, upcoming appointments, etc.  Non-urgent messages can be sent to your provider as well.   To learn more about what you can do with MyChart, go to [email or web address].    Your next appointment:   6 month(s)  The format for your next appointment:   In Person  Provider:   Shirlee More, MD   Other Instructions

## 2019-12-15 LAB — COMPREHENSIVE METABOLIC PANEL
ALT: 21 IU/L (ref 0–32)
AST: 28 IU/L (ref 0–40)
Albumin/Globulin Ratio: 1.7 (ref 1.2–2.2)
Albumin: 4.2 g/dL (ref 3.8–4.9)
Alkaline Phosphatase: 135 IU/L — ABNORMAL HIGH (ref 44–121)
BUN/Creatinine Ratio: 12 (ref 9–23)
BUN: 12 mg/dL (ref 6–24)
Bilirubin Total: 0.2 mg/dL (ref 0.0–1.2)
CO2: 25 mmol/L (ref 20–29)
Calcium: 9.4 mg/dL (ref 8.7–10.2)
Chloride: 103 mmol/L (ref 96–106)
Creatinine, Ser: 0.99 mg/dL (ref 0.57–1.00)
GFR calc Af Amer: 73 mL/min/{1.73_m2} (ref >59)
GFR calc non Af Amer: 63 mL/min/{1.73_m2} (ref >59)
Globulin, Total: 2.5 g/dL (ref 1.5–4.5)
Glucose: 99 mg/dL (ref 65–99)
Potassium: 3.8 mmol/L (ref 3.5–5.2)
Sodium: 142 mmol/L (ref 134–144)
Total Protein: 6.7 g/dL (ref 6.0–8.5)

## 2019-12-15 LAB — CBC
Hematocrit: 28.5 % — ABNORMAL LOW (ref 34.0–46.6)
Hemoglobin: 10 g/dL — ABNORMAL LOW (ref 11.1–15.9)
MCH: 33 pg (ref 26.6–33.0)
MCHC: 35.1 g/dL (ref 31.5–35.7)
MCV: 94 fL (ref 79–97)
Platelets: 303 10*3/uL (ref 150–450)
RBC: 3.03 x10E6/uL — ABNORMAL LOW (ref 3.77–5.28)
RDW: 11.9 % (ref 11.7–15.4)
WBC: 5.4 10*3/uL (ref 3.4–10.8)

## 2019-12-15 LAB — LIPID PANEL
Chol/HDL Ratio: 3.9 ratio (ref 0.0–4.4)
Cholesterol, Total: 154 mg/dL (ref 100–199)
HDL: 39 mg/dL — ABNORMAL LOW (ref >39)
LDL Chol Calc (NIH): 75 mg/dL (ref 0–99)
Triglycerides: 245 mg/dL — ABNORMAL HIGH (ref 0–149)
VLDL Cholesterol Cal: 40 mg/dL (ref 5–40)

## 2019-12-17 ENCOUNTER — Telehealth: Payer: Self-pay | Admitting: Cardiology

## 2019-12-17 NOTE — Telephone Encounter (Signed)
New Message:     Pt says she have been able to get her Midodrine in the last month. She have it now, does Dr Bettina Gavia want her to start back taking it?

## 2019-12-17 NOTE — Telephone Encounter (Signed)
We are unaware that she was not taking it would like you to restart 10 mg 3 times daily

## 2019-12-17 NOTE — Telephone Encounter (Signed)
Richardo Priest, MD  You 8 minutes ago (12:59 PM)     We are unaware that she was not taking it would like you to restart 10 mg 3 times daily     Patient made aware of the above advisement from Dr. Bettina Gavia. She states she was not aware that her pharmacy did not include the Midodrine in last months pill packs and that is why she was not taking it but will resume immediately.

## 2020-01-01 ENCOUNTER — Encounter: Payer: Self-pay | Admitting: Gastroenterology

## 2020-02-01 ENCOUNTER — Other Ambulatory Visit: Payer: Self-pay

## 2020-02-01 ENCOUNTER — Ambulatory Visit (INDEPENDENT_AMBULATORY_CARE_PROVIDER_SITE_OTHER): Payer: Medicaid Other | Admitting: Neurology

## 2020-02-01 ENCOUNTER — Encounter: Payer: Self-pay | Admitting: Neurology

## 2020-02-01 VITALS — BP 123/73 | HR 55 | Ht 63.0 in | Wt 132.2 lb

## 2020-02-01 DIAGNOSIS — R519 Headache, unspecified: Secondary | ICD-10-CM

## 2020-02-01 DIAGNOSIS — R296 Repeated falls: Secondary | ICD-10-CM | POA: Diagnosis not present

## 2020-02-01 DIAGNOSIS — R252 Cramp and spasm: Secondary | ICD-10-CM
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
## 2020-02-01 NOTE — Patient Instructions (Signed)
1. Schedule MRI brain with and without contrast  2. Schedule MRI cervical spine with and without contrast  3. Schedule routine EEG  4. Follow-up after tests, call for any changes

## 2020-02-04 ENCOUNTER — Other Ambulatory Visit: Payer: Self-pay

## 2020-02-04 ENCOUNTER — Ambulatory Visit: Payer: Medicaid Other | Admitting: Neurology

## 2020-02-04 DIAGNOSIS — R252 Cramp and spasm: Secondary | ICD-10-CM

## 2020-02-04 DIAGNOSIS — R296 Repeated falls: Secondary | ICD-10-CM | POA: Diagnosis not present

## 2020-02-04 DIAGNOSIS — R519 Headache, unspecified: Secondary | ICD-10-CM

## 2020-02-22 NOTE — Procedures (Signed)
ELECTROENCEPHALOGRAM REPORT  Date of Study: 02/04/2020  Patient's Name: Gabrielle Page MRN: [id number] Date of Birth: 11/15/1960  Referring Provider: Dr. Ellouise Newer  Clinical History: This is a 60 year old woman with body jerks leading to recurrent falls. EEG for classification.  Medications: TYLENOL 325 MG tablet VENTOLIN HFA 108 (90 Base) MCG/ACT inhaler aspirin EC 81 MG tablet LIPITOR 80 MG tablet BUSPAR 15 MG tablet PLAVIX 75 MG tablet LASIX 40 MG tablet LINZESS 290 MCG CAPS capsule ATIVAN 0.5 MG tablet LOPRESSOR 25 MG  PROAMATINE 10 MG tablet  Technical Summary: A multichannel digital EEG recording measured by the international 10-20 system with electrodes applied with paste and impedances below 5000 ohms performed in our laboratory with EKG monitoring in an awake patient.  Hyperventilation was not performed. Photic stimulation was performed.  The digital EEG was referentially recorded, reformatted, and digitally filtered in a variety of bipolar and referential montages for optimal display.    Description: The patient is awake during the recording.  During maximal wakefulness, there is a symmetric, medium voltage 9 Hz posterior dominant rhythm that attenuates with eye opening.  The record is symmetric.  Sleep was not captured. Photic stimulation did not elicit any abnormalities.  There were no epileptiform discharges or electrographic seizures seen.    EKG lead was unremarkable.  Impression: This awake EEG is normal.    Clinical Correlation: A normal EEG does not exclude a clinical diagnosis of epilepsy.  If further clinical questions remain, prolonged EEG may be helpful.  Clinical correlation is advised.   Ellouise Newer, M.D.

## 2020-02-26 ENCOUNTER — Other Ambulatory Visit: Payer: Self-pay

## 2020-02-26 ENCOUNTER — Ambulatory Visit
Admission: RE | Admit: 2020-02-26 | Discharge: 2020-02-26 | Disposition: A | Discharge status: Home / Self Care | Payer: Medicaid Other | Source: Ambulatory Visit | Attending: Neurology | Admitting: Neurology

## 2020-02-26 DIAGNOSIS — R519 Headache, unspecified: Secondary | ICD-10-CM

## 2020-02-26 DIAGNOSIS — R252 Cramp and spasm: Secondary | ICD-10-CM

## 2020-02-26 DIAGNOSIS — R296 Repeated falls: Secondary | ICD-10-CM

## 2020-02-26 MED ORDER — GADOBENATE DIMEGLUMINE 529 MG/ML IV SOLN
11.0000 mL | Freq: Once | INTRAVENOUS | Status: AC | PRN
  Administered 2020-02-26: 11 mL via INTRAVENOUS

## 2020-03-03 ENCOUNTER — Encounter: Payer: Self-pay | Admitting: Gastroenterology

## 2020-03-03 ENCOUNTER — Ambulatory Visit: Payer: Medicaid Other | Admitting: Gastroenterology

## 2020-03-03 ENCOUNTER — Telehealth: Payer: Self-pay

## 2020-03-03 VITALS — BP 106/68 | HR 51 | Ht 63.0 in | Wt 123.0 lb

## 2020-03-03 DIAGNOSIS — K5900 Constipation, unspecified: Secondary | ICD-10-CM | POA: Diagnosis not present

## 2020-03-03 DIAGNOSIS — R140 Abdominal distension (gaseous): Secondary | ICD-10-CM

## 2020-03-03 DIAGNOSIS — R1310 Dysphagia, unspecified: Secondary | ICD-10-CM | POA: Diagnosis not present

## 2020-03-03 DIAGNOSIS — K2270 Barrett's esophagus without dysplasia: Secondary | ICD-10-CM | POA: Diagnosis not present

## 2020-03-03 MED ORDER — LUBIPROSTONE 24 MCG PO CAPS: 24.0000 ug | ORAL_CAPSULE | Freq: Two times a day (BID) | ORAL | 3 refills | Status: DC

## 2020-03-03 MED ORDER — SUPREP BOWEL PREP KIT 17.5-3.13-1.6 GM/177ML PO SOLN: 1.0000 | ORAL | 0 refills | Status: DC

## 2020-03-03 NOTE — Telephone Encounter (Signed)
APPROVAL  Medication: [place]: [place] response: Approved Approval dates: 03/03/20 through 03/03/21  Hughie Closs (Key: [id number])  This request has received a Favorable outcome.  Please note any additional information provided by [facility] at the bottom of this request  Deni Berti Key: [id number] - PA Case ID: [id number] - Rx #: [id number] Need help? Call us at [phone] Outcome Approvedtoday PA Case: [id number], Status: Approved, Coverage Starts on: 03/03/2020 12:00:00 AM, Coverage Ends on: 03/03/2021 12:00:00 AM. Drug [place] 24MCG capsules Form [employer] [facility] [employer] Electronic [place] Form [phone] NCPDP) Original Claim Info [phone]  PRIOR AUTHORIZATION  PA initiation date: 03/03/20  Medication: [place]: [place]  [place] Submission completed electronically through [employer] My Meds: Yes  Will await insurance response re: approval/denial.  Your information has been sent to [employer] [facility] [employer].

## 2020-03-03 NOTE — Progress Notes (Signed)
in  Referring Provider: Jaclynn Major, NP Primary Care Physician:  Jaclynn Major, NP  Reason for Consultation: Barrett's esophagus   IMPRESSION:  Dysphagia/choking with solids    - Similar symptoms have improved with empiric dilation in the past    - EGD with +/- dilation +/- biopsies to evaluate for reflux, infectious esophagitis, and EOE    - Consider MBBS in the future given potential for oral pharyngeal component to her dysphagia Bloating and abdominal discomfort    - Etiology unclear.  Prior CT at [facility] in 2019 showed no cause. Barrett's Esophagus on EGD 2019 at [facility]    - EGD with surveillance biopsies recommended    - Continue daily proton pump inhibitor Gastric ulcer bleed requiring hospitalization at [facility] 2019    - Likely due to NSAIDs as gastric biopsies were negative for H. Pylori    - Repeat EGD recommended giving ongoing abdominal discomfort and bloating Reflux esophagitis on EGD 2019 Chronic constipation with suspected dyssynergic defecation    - Worsened by recent use of oxycodone    - May be exacerbated by immobility associated with ongoing back pain    - No response to prune juice or magnesium oxide    - Intolerant of Linzess due to associated severe bloating    - Trial of Amitiza    - Referral for anorectal manometry after endoscopy Colon cancer screening    - colonoscopy at [facility] 2019 was not sufficient for colon cancer screening    - [facility] MD recommended colonoscopy in 1 years IBS by history   PLAN: Continue pantoprazole  EGD with possible dilation Colonoscopy with 2 day bowel prep Amitiza 24 mcg BID Obtain prior GI records including records from [place], prior CT scans Consider trial of dicyclomine in the future Consider anorectal manometry in the future  Please see the "Patient Instructions" section for addition details about the plan.  HPI: Chrystal Zeimet is a 60 y.o. female referred by [facility] for multiple  GI complaints.  The history is obtained through the patient, records provided by NP [facility] and records available through care everywhere.  She has a history of coronary artery disease with a triple bypass 03/2017, Barrett's esophagus, anxiety, asthma, chronic diastolic congestive heart failure, hypertension, pneumonia, pancreatitis, vascular dementia, and anxiety.  She has previously had a cholecystectomy, bile duct reconstruction, appendectomy, and hysterectomy.  She is disabled.  She reports multiple GI concerns today including:  (1) Barrett's Esophagus: Diagnosed that in 2005 in [place], [place]. On a PPI since that time.  Last EGD at [facility] in 2019 showed no dysplasia.  [place] mention that the Barrett's esophagus was found on an EGD in 2012 but resolved with repeat EGD 6 months later.  Requesting surveillance endoscopy.  (2) Recurrent dysphagia: Initially occurred a couple of years ago. Reports choking on solids, not liquids. Reflux controlled on pantoprazole.  No odynophagia, dysphonia, neck pain, sore throat.  Previously improved with esophageal dilation x 3, most recently at [facility] in 2019.   (3) Abdominal bloating x one month: Associated distension and concurrent constipation. Thought she was pregnant.  Concerned she may have a blockage.  (4) Constipation:  Has longstanding constipation. Even with magnesium oxide prune juice she only has one bowel movements once of twice daily. Straining, incomplete evacuation, and occassional need for manual assistance with defecation. Worsened by recent back pain and use of oxycodone, but she doesn't use it any more. Did not tolerate Linzess due to progressive  bloating.  Prior testing shows normal TSH and calcium levels.  (5) Bright red blood in the stools: Intermittent. She understands this is due to ulcers.   (6) Concerns for hernia: She is concerned that her hernia may be causing abdominal bloating and discomfort..  (7) History of  bleeding ulcers presenting with acute GI blood loss anemia. Colonoscopy and EGD at [facility] for GI bleed 09/20/17.  EGD showed gastric antral ulcers x2.  Gastric biopsies showed chronic inactive gastritis without H pylori. GE junction biopsies showed reflux and Barrett's Esophagus with esophageal biopsies showing acute and chronic inflammation, erosion, and intestinal metaplasia.  Colonoscopy was incomplete due to retained stool.  They recommended colonoscopy in 1 year for colon cancer screening.  CT of the abdomen and pelvis during that hospitalization showed gastritis and diverticulosis without acute diverticulitis.  (8) History of IBS  (9) diverticulosis on colonoscopy and CT scan at [facility] 2019  Appetite is some loss of appetite.  Weight fluctuates but is overall stable.  Strong family history of reflux. No known family history of colon cancer or polyps. No family history of uterine/endometrial cancer, pancreatic cancer or gastric/stomach cancer.  Also reports a colonoscopy and EGD in 2012 in [place].  Past Medical History:  Diagnosis Date  . Abnormal abdominal CT scan 05/14/2019  . Acute ST elevation myocardial infarction (STEMI) of inferolateral wall ([place]) 11/24/2018  . Anemia 05/14/2019  . Anxiety 05/14/2019  . Asthma   . Chronic bronchitis ([place])    "usually get it q yr" (11/05/2016)  . Chronic diastolic heart failure ([place]) 04/17/2017  . Constipation 05/14/2019  . Coronary artery disease   . Coronary artery disease involving native coronary artery of native heart with unstable angina pectoris ([place])   . Coronary artery disease involving native coronary artery with angina pectoris ([place]) 04/08/2017  . Fecal impaction ([place]) 05/14/2019  . GERD (gastroesophageal reflux disease)   . Headache    "weekly" (11/05/2016)  . Heart murmur    "dx'd when I was a child"  . Hepatitis 1991   "when I had my gallbladder out" (11/05/2016)  . History of blood transfusion 1985   S/P  childbirth  . History of kidney stones   . History of peptic ulcer 05/14/2019  . HLD (hyperlipidemia) 11/27/2018  . HTN (hypertension) 05/14/2019  . Hypokalemia 04/17/2017  . Iron deficiency anemia 03/07/2019  . Irritable bowel syndrome 05/14/2019  . LBBB (left bundle branch block) 11/05/2016  . NSTEMI (non-ST elevated myocardial infarction) ([place]) 11/05/2016   9/18 PCI/DESx1 to p/mLAD  . Orthostatic hypotension   . Pancreatitis 1991  . Pericardial effusion 04/08/2017  . Pneumonia    "twice" (11/05/2016)  . 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 Severe mitral regurgitation 05/14/2019  . Syncope 05/14/2019  . Therapeutic opioid induced constipation 05/14/2019  . Unstable angina ([place]) 03/04/2019    Past Surgical History:  Procedure Laterality Date  . ABDOMINAL HYSTERECTOMY  1990  . ANKLE FRACTURE SURGERY  2021   left  . APPENDECTOMY  1992  . BILE DUCT EXPLORATION  1991   "reconstruction"  . CESAREAN SECTION  1985  . CHOLECYSTECTOMY OPEN  1991  . CORONARY ANGIOPLASTY WITH STENT PLACEMENT  11/05/2016  . CORONARY ARTERY BYPASS GRAFT N/A 03/26/2017   Procedure: CORONARY ARTERY BYPASS GRAFTING (CABG) x 3 WITH ENDOSCOPIC HARVESTING OF RIGHT SAPHENOUS VEIN;  Surgeon: Ivin Poot, MD;  Location: [facility];  Service: Open Heart Surgery;  Laterality: N/A;  . CORONARY STENT INTERVENTION N/A  11/05/2016   Procedure: CORONARY STENT INTERVENTION;  Surgeon: Martinique, Peter M, MD;  Location: [place] [facility];  Service: Cardiovascular;  Laterality: N/A;  . CYSTOSCOPY WITH STENT PLACEMENT  "3 times" (11/05/2016)  . DILATION AND CURETTAGE OF UTERUS  1989  . INTRAVASCULAR ULTRASOUND/IVUS N/A 03/22/2017   Procedure: Intravascular Ultrasound/IVUS;  Surgeon: Martinique, Peter M, MD;  Location: [place] [facility];  Service: Cardiovascular;  Laterality: N/A;  . KNEE ARTHROSCOPY Right 2000s  . LEFT HEART CATH AND CORONARY ANGIOGRAPHY N/A 11/05/2016   Procedure: LEFT HEART CATH AND CORONARY ANGIOGRAPHY;  Surgeon: Martinique, Peter M, MD;   Location: [place] [facility];  Service: Cardiovascular;  Laterality: N/A;  . LEFT HEART CATH AND CORONARY ANGIOGRAPHY N/A 03/22/2017   Procedure: LEFT HEART CATH AND CORONARY ANGIOGRAPHY;  Surgeon: Martinique, Peter M, MD;  Location: [place] [facility];  Service: Cardiovascular;  Laterality: N/A;  . LEFT HEART CATH AND CORS/GRAFTS ANGIOGRAPHY N/A 03/05/2019   Procedure: LEFT HEART CATH AND CORS/GRAFTS ANGIOGRAPHY;  Surgeon: Leonie Man, MD;  Location: [place] [facility];  Service: Cardiovascular;  Laterality: N/A;  . NASAL SINUS SURGERY  ~ 2009   "exposed to mildew; had to have the infectoin drained; cut the outside of my face"  . OVARIAN CYST SURGERY  X 6  . RIGHT/LEFT HEART CATH AND CORONARY/GRAFT ANGIOGRAPHY N/A 11/24/2018   Procedure: RIGHT/LEFT HEART CATH AND CORONARY/GRAFT ANGIOGRAPHY;  Surgeon: Leonie Man, MD;  Location: [place] [facility];  Service: Cardiovascular;  Laterality: N/A;  . TEE WITHOUT CARDIOVERSION N/A 03/26/2017   Procedure: TRANSESOPHAGEAL ECHOCARDIOGRAM (TEE);  Surgeon: Prescott Gum, [name], MD;  Location: [place] [facility];  Service: Open Heart Surgery;  Laterality: N/A;    Current Outpatient Medications  Medication Sig Dispense Refill  . acetaminophen (TYLENOL) 325 MG tablet Take 325 mg by mouth every 6 (six) hours as needed for moderate pain or headache.     . albuterol (VENTOLIN HFA) 108 (90 Base) MCG/ACT inhaler Inhale 2 puffs into the lungs every 6 (six) hours as needed for wheezing or shortness of breath.    Marland Kitchen aspirin EC 81 MG tablet Take 81 mg by mouth daily.    Marland Kitchen atorvastatin (LIPITOR) 80 MG tablet Take 1 tablet (80 mg total) by mouth daily at 6 PM. 30 tablet 6  . busPIRone (BUSPAR) 15 MG tablet Take 15 mg by mouth 3 (three) times daily.     . furosemide (LASIX) 40 MG tablet Take 1 tablet (40 mg total) by mouth 3 (three) times a week. 45 tablet 3  . lubiprostone (AMITIZA) 24 MCG capsule Take 1 capsule (24 mcg total) by mouth 2 (two) times daily with a meal. 60 capsule 3  .  magnesium oxide (MAG-OX) 400 (241.3 Mg) MG tablet Take 1 tablet by mouth daily.    . metoprolol tartrate (LOPRESSOR) 25 MG tablet Take 1 tablet (25 mg total) by mouth 2 (two) times daily. 180 tablet 3  . Multiple Vitamin (MULTIVITAMIN WITH MINERALS) TABS tablet Take 1 tablet by mouth daily.    . nitroGLYCERIN (NITROSTAT) 0.4 MG SL tablet Place 0.4 mg under the tongue every 5 (five) minutes as needed for chest pain.    Marland Kitchen ondansetron (ZOFRAN) 4 MG tablet Take 4 mg by mouth daily as needed for nausea.    . pantoprazole (PROTONIX) 40 MG tablet Take 40 mg by mouth daily.    . potassium chloride SA (KLOR-CON) 20 MEQ tablet Take 20 mEq by mouth daily as needed.    Marland Kitchen  ranolazine (RANEXA) 1000 MG SR tablet Take 1,000 mg by mouth 2 (two) times daily.    Marland Kitchen rOPINIRole (REQUIP) 0.5 MG tablet Take 0.5 mg by mouth daily.    . sertraline (ZOLOFT) 50 MG tablet Take 50 mg by mouth daily.    Manus Gunning BOWEL PREP KIT 17.5-3.13-1.6 GM/177ML SOLN Take 1 kit by mouth as directed. For colonoscopy prep 354 mL 0  . traZODone (DESYREL) 150 MG tablet Take 300 mg by mouth at bedtime.      No current facility-administered medications for this visit.    Allergies as of 03/03/2020 - Review Complete 03/03/2020  Allergen Reaction Noted  . Amiodarone Diarrhea and Nausea And Vomiting 04/17/2017  . Colchicine Diarrhea and Nausea And Vomiting 04/17/2017  . Compazine [prochlorperazine edisylate] Other (See Comments) 11/05/2016  . Other Other (See Comments) and Cough 11/05/2016  . Prochlorperazine Other (See Comments) 04/09/2019    Family History  Problem Relation Age of Onset  . Hypertension Father   . Alcohol abuse Father   . Heart attack Father   . Diabetes Mellitus II Father   . Suicidality Mother   . Breast cancer Sister   . Colon cancer Paternal Grandfather   . Colon polyps Paternal Grandfather     Social History   Socioeconomic History  . Marital status: Divorced    Spouse name: Not on file  . Number of  children: Not on file  . Years of education: Not on file  . Highest education level: Not on file  Occupational History  . Occupation: disable  Tobacco Use  . Smoking status: Never Smoker  . Smokeless tobacco: Never Used  Vaping Use  . Vaping Use: Never used  Substance and Sexual Activity  . Alcohol use: Not Currently    Comment: 11/05/2016 "maybe 1 glass of wine/month"  . Drug use: No  . Sexual activity: Not Currently  Other Topics Concern  . Not on file  Social History Narrative   Right handed    Lives with boy friend    Social Determinants of Health   Financial Resource Strain: Not on file  Food Insecurity: Not on file  Transportation Needs: Not on file  Physical Activity: Not on file  Stress: Not on file  Social Connections: Not on file  Intimate Partner Violence: Not on file    Review of Systems: 12 system ROS is negative except as noted above with the addition of allergies, anxiety, back pain, confusion, fatigue, headaches, shortness of breath, excessive urination, and urinary leakage.   Physical Exam: General:   Alert,  well-nourished, pleasant and cooperative in NAD Head:  Normocephalic and atraumatic. Eyes:  Sclera clear, no icterus.   Conjunctiva pink. Ears:  Normal auditory acuity. Nose:  No deformity, discharge,  or lesions. Mouth:  No deformity or lesions.   Neck:  Supple; no masses or thyromegaly. Lungs:  Clear throughout to auscultation.   No wheezes.  Well-healed sternal scar. Heart:  Regular rate and rhythm; no murmurs. Abdomen:  Soft, nontender, nondistended, normal bowel sounds, no rebound or guarding. No hepatosplenomegaly.  There is a well-healed open cholecystectomy scar. Rectal:  Deferred  Msk:  Symmetrical. No boney deformities LAD: No inguinal or umbilical LAD Extremities:  No clubbing or edema. Neurologic:  Alert and  oriented x4;  grossly nonfocal Skin:  Intact without significant lesions or rashes. Psych:  Alert and cooperative. Normal  mood and affect.    Bryant Lipps L. Tarri Glenn, MD, MPH 03/03/2020, 12:33 PM

## 2020-03-03 NOTE — Patient Instructions (Addendum)
I have recommended an EGD with possible dilation and a colonoscopy for further evaluation of your symptoms.  In the meantime, continue to take pantoprazole every day.  I recommend a trial of Amitiza 24 mcg taken twice daily.  You may adjust the dose if you develop diarrhea.  I will also work to obtain records from your prior gastroenterologist.  If you are age 58 or younger, your body mass index should be between 19-25. Your Body mass index is 21.79 kg/m. If this is out of the aformentioned range listed, please consider follow up with your Primary Care Provider.   You have been scheduled for an endoscopy and colonoscopy. Please follow the written instructions given to you at your visit today. Please pick up your prep supplies at the pharmacy within the next 1-3 days. If you use inhalers (even only as needed), please bring them with you on the day of your procedure.   We have sent the following medications to your pharmacy for you to pick up at your convenience: Amitiza, [place] /DULCOLAX  Please pick up your prep from the pharmacy within 2-3 days of getting your prep instructions.  Please do NOT wait until the day before your procedure to pick it up as it does not give our office sufficient time to assist should any pharmacy issues arise.   In addition to [place] from your pharmacist, please purchase the following over the counter items: One 116 gram bottle of Miralax (polyetheylene glycol)  One bottle of Dulcolax 5 mg laxative tablets (you will need 4 tablets)  One 32 oz. bottle of Gatorade (NO red or purple)   ________________________________________________________________________  2 DAYS BEFORE PROCEDURE :          DATE:     03/22/20         DAY: Tuesday  1. In the morning, mix the entire 116 gram  bottle of Miralax with your 32 oz. of room temperature Gatorade. Mix until dissolved and refrigerate.   2. Eat a regular diet until start of prep in the evening (around 4 pm), then clear liquids only.  3. At 3:00 pm take 4 Dulcolax tablets.  4. Eat an early dinner (around 4 pm) then, between 5:00pm and 7:00 pm, take the Miralax mixture from the refrigerator drink 8 oz. of the mixture every 15 minutes until gone.    Once you have completed this prep, you are ONLY allowed clear liquids until after the procedure.    Clear liquids include: NO RED & NO PURPLE  Water Jello  Ice Popsicles  Tea (sugar ok, no milk/cream) Powdered fruit flavored drinks  Coffee (sugar ok, no milk/cream) Gatorade  Juice: apple, white grape, white cranberry Lemonade, Kool-Aid  Clear bullion,broth (vegetable,chicken,beef) Carbonated beverages (any kind)  Strained chicken noodle soup  (no noodles or  chicken) Hard Candy     It was a pleasure to meet you today.  Thank you for trusting me with your gastrointestinal care!    Thornton Park, MD, MPH

## 2020-03-14 ENCOUNTER — Other Ambulatory Visit: Payer: Self-pay

## 2020-03-14 MED ORDER — RANOLAZINE ER 1000 MG PO TB12: 1000.0000 mg | ORAL_TABLET | Freq: Two times a day (BID) | ORAL | 2 refills | Status: DC

## 2020-03-14 NOTE — Telephone Encounter (Signed)
Refill of Ranexa 1000 mg sent to [facility] in [place].

## 2020-03-17 ENCOUNTER — Ambulatory Visit: Payer: Medicaid Other | Admitting: Neurology

## 2020-03-17 ENCOUNTER — Other Ambulatory Visit: Payer: Self-pay

## 2020-03-17 ENCOUNTER — Encounter: Payer: Self-pay | Admitting: Neurology

## 2020-03-17 VITALS — BP 135/80 | HR 52 | Ht 63.0 in | Wt 121.2 lb

## 2020-03-17 DIAGNOSIS — R296 Repeated falls: Secondary | ICD-10-CM

## 2020-03-17 DIAGNOSIS — R252 Cramp and spasm: Secondary | ICD-10-CM

## 2020-03-17 NOTE — Progress Notes (Signed)
NEUROLOGY FOLLOW UP OFFICE NOTE  Gabrielle Page [id number] [birth year]  HISTORY OF PRESENT ILLNESS: I had the pleasure of seeing Gabrielle Page in follow-up in the neurology clinic on 03/17/2020.  The patient was last seen 6 weeks ago for frequent falls and body jerks. She is alone in the office today. Records and images were personally reviewed where available.  I personally reviewed MRI brain and cervical spine with and without contrast done 02/26/2020, no acute changes. There was mild chronic microvascular disease, a small nonenhancing subcentimeter focus of abnormal signal in the right temporal lobe most likely reflecting prior ischemic insult (consider follow-up study in a year), no spinal cord abnormalities noted. Her wake EEG was normal. She reports doing better since her last visit. The last fall was in November, she has not been having any more body jerks. She is doing physical therapy for her back, left boot came off recently.    History on Initial Assessment 02/01/2020: This is a 60 year old right-handed woman with a history of CAD, symptomatic anemia with hypotension, hyperlipidemia, anxiety, IBS, GERD, presenting for evaluation of body jerking that have led to recurrent falls. Symptoms started in January, she has had several falls since February. She would fell shaking a little bit inside, then her extremities start jerking, she has no control over her arms and legs, she would try to grab on to something but would still fall. The shaking then stops after a few minutes. She does not have any jerking in her sleep. She has fractured her right ankle, lower back in September, then most recently 6 weeks ago she had a fall and fractured her left ankle which is now in a boot. One time she was walking then felt like someone behind her knocked her forward and her face hit the sink. She denies any loss of consciousness with these episodes. She would feel lightheaded and feels her balance is off.  She went to [place] [facility] in 04/2019 for these symptoms and was noted to be very anxious and tearful. It was noted that behavior was easily redirected and were felt to be due to anxiety. She was prescribed lorazepam. She reports the lorazepam did not help because she still had the fall 6 weeks ago while taking it, and has weaned off lorazepam 4 days ago. There is one episode of sleepwalking the other night which she does not remember, her boyfriend told her she was walking around without her brace, looking for their baby (which does not exist). She denies any staring/unresponsive episodes, loss of time, olfactory/gustatory hallucinations, focal numbness/tingling/weakness. She had a normal birth and early development.  There is no history of febrile convulsions, CNS infections such as meningitis/encephalitis, significant traumatic brain injury, neurosurgical procedures, or family history of seizures.  She reports that after heart surgery 3 years ago, she had a few episodes of syncope. These have not recurred. She was told several coronary arteries are clogged but cannot be fixed because "they are so tiny." She cannot exercise due to frequent falls, she is scared to get up and walk. She has had headaches for many years with pressure and stabbing pain in her temples. Tylenol helps sometimes, she takes 2 tablets daily. She has IBS with bloating, nausea, and will be seeing GI next month. She has neck pain, "everything is so tight." She has back pain from her fall in 10/2019. MRI lumbar spine showed acute compression fractures at T11 and L2. She has had memory issues since her heart surgery,  she has to write everything down. She was driving until her back injury in 10/2019 and denies getting lost driving. She denies missing medications or bill payments.    PAST MEDICAL HISTORY: Past Medical History:  Diagnosis Date  . Abnormal abdominal CT scan 05/14/2019  . Acute ST elevation myocardial infarction (STEMI) of  inferolateral wall ([place]) 11/24/2018  . Anemia 05/14/2019  . Anxiety 05/14/2019  . Asthma   . Chronic bronchitis ([place])    "usually get it q yr" (11/05/2016)  . Chronic diastolic heart failure ([place]) 04/17/2017  . Constipation 05/14/2019  . Coronary artery disease   . Coronary artery disease involving native coronary artery of native heart with unstable angina pectoris ([place])   . Coronary artery disease involving native coronary artery with angina pectoris ([place]) 04/08/2017  . Fecal impaction ([place]) 05/14/2019  . GERD (gastroesophageal reflux disease)   . Headache    "weekly" (11/05/2016)  . Heart murmur    "dx'd when I was a child"  . Hepatitis 1991   "when I had my gallbladder out" (11/05/2016)  . History of blood transfusion 1985   S/P childbirth  . History of kidney stones   . History of peptic ulcer 05/14/2019  . HLD (hyperlipidemia) 11/27/2018  . HTN (hypertension) 05/14/2019  . Hypokalemia 04/17/2017  . Iron deficiency anemia 03/07/2019  . Irritable bowel syndrome 05/14/2019  . LBBB (left bundle branch block) 11/05/2016  . NSTEMI (non-ST elevated myocardial infarction) ([place]) 11/05/2016   9/18 PCI/DESx1 to p/mLAD  . Orthostatic hypotension   . Pancreatitis 1991  . Pericardial effusion 04/08/2017  . Pneumonia    "twice" (11/05/2016)  . 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 Severe mitral regurgitation 05/14/2019  . Syncope 05/14/2019  . Therapeutic opioid induced constipation 05/14/2019  . Unstable angina ([place]) 03/04/2019    MEDICATIONS: Current Outpatient Medications on File Prior to Visit  Medication Sig Dispense Refill  . acetaminophen (TYLENOL) 325 MG tablet Take 325 mg by mouth every 6 (six) hours as needed for moderate pain or headache.     . albuterol (VENTOLIN HFA) 108 (90 Base) MCG/ACT inhaler Inhale 2 puffs into the lungs every 6 (six) hours as needed for wheezing or shortness of breath.    Marland Kitchen aspirin EC 81 MG tablet Take 81 mg by mouth daily.    Marland Kitchen atorvastatin (LIPITOR) 80 MG tablet Take 1 tablet (80 mg  total) by mouth daily at 6 PM. 30 tablet 6  . busPIRone (BUSPAR) 15 MG tablet Take 15 mg by mouth 3 (three) times daily.     . furosemide (LASIX) 40 MG tablet Take 1 tablet (40 mg total) by mouth 3 (three) times a week. 45 tablet 3  . lubiprostone (AMITIZA) 24 MCG capsule Take 1 capsule (24 mcg total) by mouth 2 (two) times daily with a meal. 60 capsule 3  . magnesium oxide (MAG-OX) 400 (241.3 Mg) MG tablet Take 1 tablet by mouth daily.    . metoprolol tartrate (LOPRESSOR) 25 MG tablet Take 1 tablet (25 mg total) by mouth 2 (two) times daily. 180 tablet 3  . Multiple Vitamin (MULTIVITAMIN WITH MINERALS) TABS tablet Take 1 tablet by mouth daily.    . nitroGLYCERIN (NITROSTAT) 0.4 MG SL tablet Place 0.4 mg under the tongue every 5 (five) minutes as needed for chest pain.    Marland Kitchen ondansetron (ZOFRAN) 4 MG tablet Take 4 mg by mouth daily as needed for nausea.    . pantoprazole (PROTONIX) 40 MG tablet Take 40 mg by  mouth daily.    . potassium chloride SA (KLOR-CON) 20 MEQ tablet Take 20 mEq by mouth daily as needed.    . ranolazine (RANEXA) 1000 MG SR tablet Take 1 tablet (1,000 mg total) by mouth 2 (two) times daily. 180 tablet 2  . rOPINIRole (REQUIP) 0.5 MG tablet Take 0.5 mg by mouth daily.    . sertraline (ZOLOFT) 50 MG tablet Take 50 mg by mouth daily.    Manus Gunning BOWEL PREP KIT 17.5-3.13-1.6 GM/177ML SOLN Take 1 kit by mouth as directed. For colonoscopy prep 354 mL 0  . traZODone (DESYREL) 150 MG tablet Take 300 mg by mouth at bedtime.      No current facility-administered medications on file prior to visit.    ALLERGIES: Allergies  Allergen Reactions  . Amiodarone Diarrhea and Nausea And Vomiting    In combination with colchicine  . Colchicine Diarrhea and Nausea And Vomiting    Pt states she had rnx to medication; severe V/D  . Compazine [Prochlorperazine Edisylate] Other (See Comments)    SEIZURE  . Other Other (See Comments) and Cough    Steroids cause uncontrollable coughing  .  Prochlorperazine Other (See Comments)    FAMILY HISTORY: Family History  Problem Relation Age of Onset  . Hypertension Father   . Alcohol abuse Father   . Heart attack Father   . Diabetes Mellitus II Father   . Suicidality Mother   . Breast cancer Sister   . Colon cancer Paternal Grandfather   . Colon polyps Paternal Grandfather     SOCIAL HISTORY: Social History   Socioeconomic History  . Marital status: Divorced    Spouse name: Not on file  . Number of children: Not on file  . Years of education: Not on file  . Highest education level: Not on file  Occupational History  . Occupation: disable  Tobacco Use  . Smoking status: Never Smoker  . Smokeless tobacco: Never Used  Vaping Use  . Vaping Use: Never used  Substance and Sexual Activity  . Alcohol use: Not Currently    Comment: 11/05/2016 "maybe 1 glass of wine/month"  . Drug use: No  . Sexual activity: Not Currently  Other Topics Concern  . Not on file  Social History Narrative   Right handed    Lives with boy friend    Social Determinants of Health   Financial Resource Strain: Not on file  Food Insecurity: Not on file  Transportation Needs: Not on file  Physical Activity: Not on file  Stress: Not on file  Social Connections: Not on file  Intimate Partner Violence: Not on file     PHYSICAL EXAM: Vitals:   03/17/20 0831  BP: 135/80  Pulse: (!) 52  SpO2: 98%   General: No acute distress Head:  Normocephalic/atraumatic Skin/Extremities: No rash, no edema Neurological Exam: alert and awake. No aphasia or dysarthria. Fund of knowledge is appropriate.  Attention and concentration are normal.   Cranial nerves: Pupils equal, round. Extraocular movements intact with no nystagmus. Visual fields full.  No facial asymmetry.  Motor: Bulk and tone normal, muscle strength 5/5 throughout with no pronator drift. Reflexes brisk +2 throughout, negative Hoffman sign.  Finger to nose testing intact.  Gait narrow-based and  steady,no ataxia.   IMPRESSION: This is a 60 yo RH woman with a history of CAD, symptomatic anemia with hypotension, hyperlipidemia, anxiety, IBS, GERD, who presented for body jerking that had led to recurrent falls. MRI brain no acute changes, no  evidence of myelopathy on cervical spine MRI. EEG was also normal. Etiology of symptoms unclear, she reports improvement with no falls or body jerking since November 2021. Continue with physical therapy. If symptoms recur, we will plan for a 72-hour EEG. There was note a small focus of abnormal signal in the right temporal lobe probably remote ischemic insult, follow-up MRI brain will be ordered in a year. She knows to call for any changes, follow-up in 6-8 months.    Thank you for allowing me to participate in her care.  Please do not hesitate to call for any questions or concerns.   Ellouise Newer, M.D.   CC: Jaclynn Major, NP

## 2020-03-17 NOTE — Patient Instructions (Signed)
Good to hear about how you are doing. If body jerks recur, please call our office and we will plan for a 3-day EEG. We will also plan for repeat brain MRI in January 2023. Follow-up in 6-8 months, call for any changes

## 2020-03-23 ENCOUNTER — Encounter: Payer: Self-pay | Admitting: Certified Registered Nurse Anesthetist

## 2020-03-24 ENCOUNTER — Other Ambulatory Visit: Payer: Self-pay

## 2020-03-24 ENCOUNTER — Ambulatory Visit (AMBULATORY_SURGERY_CENTER): Payer: Medicaid Other | Admitting: Gastroenterology

## 2020-03-24 ENCOUNTER — Telehealth: Payer: Self-pay | Admitting: Gastroenterology

## 2020-03-24 ENCOUNTER — Encounter: Payer: Self-pay | Admitting: Gastroenterology

## 2020-03-24 VITALS — BP 102/57 | HR 54 | Temp 98.0°F | Resp 18 | Ht 63.0 in | Wt 123.0 lb

## 2020-03-24 DIAGNOSIS — K2960 Other gastritis without bleeding: Secondary | ICD-10-CM

## 2020-03-24 DIAGNOSIS — K2270 Barrett's esophagus without dysplasia: Secondary | ICD-10-CM

## 2020-03-24 DIAGNOSIS — K5900 Constipation, unspecified: Secondary | ICD-10-CM | POA: Diagnosis not present

## 2020-03-24 DIAGNOSIS — K648 Other hemorrhoids: Secondary | ICD-10-CM | POA: Diagnosis not present

## 2020-03-24 DIAGNOSIS — K449 Diaphragmatic hernia without obstruction or gangrene: Secondary | ICD-10-CM

## 2020-03-24 DIAGNOSIS — K319 Disease of stomach and duodenum, unspecified: Secondary | ICD-10-CM | POA: Diagnosis not present

## 2020-03-24 DIAGNOSIS — D123 Benign neoplasm of transverse colon: Secondary | ICD-10-CM

## 2020-03-24 DIAGNOSIS — K3189 Other diseases of stomach and duodenum: Secondary | ICD-10-CM | POA: Diagnosis not present

## 2020-03-24 DIAGNOSIS — K2950 Unspecified chronic gastritis without bleeding: Secondary | ICD-10-CM

## 2020-03-24 MED ORDER — SODIUM CHLORIDE 0.9 % IV SOLN: 500.0000 mL | Freq: Once | INTRAVENOUS | Status: DC

## 2020-03-24 NOTE — Telephone Encounter (Signed)
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

## 2020-03-24 NOTE — Progress Notes (Signed)
1501 Robinul 0.1 mg IV given due large amount of secretions upon assessment.  MD made aware, vss

## 2020-03-24 NOTE — Progress Notes (Signed)
VS- Gabrielle Conrad RN  Cell phone off per pt  Pt states she "takes nitro 1 tablet weekly.  It's just to calm my nerves cause I get scared.  I took one a week ago"  Denies chest pain.  Elizabeth Palau CRNA made aware and ok to proceed

## 2020-03-24 NOTE — Progress Notes (Signed)
Report given to PACU, vss 

## 2020-03-24 NOTE — Op Note (Signed)
[place] Patient Name: Gabrielle Page Procedure Date: 03/24/2020 3:02 PM MRN: [id number] Endoscopist: Thornton Park MD, MD Age: 60 Referring MD:  Date of Birth: September 05, 1960 Gender: Female Account #: [id number] Procedure:                Colonoscopy Indications:              Screening for colorectal malignant neoplasm, Last                            colonoscopy: August 2019                           Colon cancer screening                           - colonoscopy at [facility] 2019 was not sufficient for                            colon cancer screening                           - [facility] MD recommended colonoscopy in 1 year                           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 The patient's tolerance of                            previous anesthesia was also reviewed. The risks                            and benefits of the procedure and the sedation                            options and risks were discussed with the patient.                            All questions were answered, and informed consent                            was obtained. Prior Anticoagulants: The patient has  taken no previous anticoagulant or antiplatelet                            agents. ASA Grade Assessment: III - A patient with                            severe systemic disease. After reviewing the risks                            and benefits, the patient was deemed in                            satisfactory condition to undergo the procedure.                           After obtaining informed consent, the colonoscope                             was passed under direct vision. Throughout the                            procedure, the patient's blood pressure, pulse, and                            oxygen saturations were monitored continuously. The                            Olympus [id number]) [id number] was introduced                            through the anus and advanced to the 3 cm into the                            ileum. The colonoscopy was performed with moderate                            difficulty due to poor endoscopic visualization.                            The patient tolerated the procedure well. The                            quality of the bowel preparation was inadequate.                            The terminal ileum, ileocecal valve, appendiceal                            orifice, and rectum were photographed. Scope In: 3:22:21 PM Scope Out: 3:35:19 PM Scope Withdrawal Time: 0 hours 8 minutes 57 seconds  Total Procedure Duration: 0 hours 12 minutes 58 seconds  Findings:                 Hemorrhoids were found on perianal exam.  Non-bleeding internal hemorrhoids were found.                           A 3 mm polyp was found in the transverse colon. The                            polyp was sessile. The polyp was removed with a                            cold snare. Resection was complete, but the polyp                            tissue was not retrieved. Estimated blood loss was                            minimal.                           A moderate amount of stool was found in the entire                            colon, precluding full visualization of small and                            medium sized polyps.                           The exam was otherwise without abnormality on                            direct and retroflexion views. Complications:            No immediate complications. Estimated blood loss:                            Minimal. Estimated  Blood Loss:     Estimated blood loss was minimal. Impression:               - Preparation of the colon was inadequate.                           - Hemorrhoids found on perianal exam.                           - Non-bleeding internal hemorrhoids.                           - One 3 mm polyp in the transverse colon, removed                            with a cold snare. Complete resection. Polyp tissue                            not retrieved.                           -  Stool in the entire examined colon.                           - The examination was otherwise normal on direct                            and retroflexion views. Recommendation:           - Patient has a contact number available for                            emergencies. The signs and symptoms of potential                            delayed complications were discussed with the                            patient. Return to normal activities tomorrow.                            Written discharge instructions were provided to the                            patient.                           - Resume previous diet.                           - Continue present medications.                           - Repeat colonoscopy in 1 year because the bowel                            preparation was poor.                           - Emerging evidence supports eating a diet of                            fruits, vegetables, grains, calcium, and yogurt                            while reducing red meat and alcohol may reduce the                            risk of colon cancer.                           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                           - Thank you for allowing me to be involved in your  colon cancer prevention. Thornton Park MD, MD 03/24/2020 3:51:25 PM This report has been signed electronically.

## 2020-03-24 NOTE — Progress Notes (Signed)
Called to room to assist during endoscopic procedure.  Patient ID and intended procedure confirmed with present staff. Received instructions for my participation in the procedure from the performing physician.  

## 2020-03-24 NOTE — Patient Instructions (Signed)
No Aspirin, Ibuprofen, naproxen or other NSAIDS, take tylenol if needed.  You may take your baby aspirin daily.  Await pathology results for further instructions.  Repeat colonoscopy in 1 year.  That will be arranged by the office.  YOU HAD AN ENDOSCOPIC PROCEDURE TODAY AT [facility]:   Refer to the procedure report that was given to you for any specific questions about what was found during the examination.  If the procedure report does not answer your questions, please call your gastroenterologist to clarify.  If you requested that your care partner not be given the details of your procedure findings, then the procedure report has been included in a sealed envelope for you to review at your convenience later.  YOU SHOULD EXPECT: Some feelings of bloating in the abdomen. Passage of more gas than usual.  Walking can help get rid of the air that was put into your GI tract during the procedure and reduce the bloating. If you had a lower endoscopy (such as a colonoscopy or flexible sigmoidoscopy) you may notice spotting of blood in your stool or on the toilet paper. If you underwent a bowel prep for your procedure, you may not have a normal bowel movement for a few days.  Please Note:  You might notice some irritation and congestion in your nose or some drainage.  This is from the oxygen used during your procedure.  There is no need for concern and it should clear up in a day or so.  SYMPTOMS TO REPORT IMMEDIATELY:   Following lower endoscopy (colonoscopy or flexible sigmoidoscopy):  Excessive amounts of blood in the stool  Significant tenderness or worsening of abdominal pains  Swelling of the abdomen that is new, acute  Fever of 100F or higher   Following upper endoscopy (EGD)  Vomiting of blood or coffee ground material  New chest pain or pain under the shoulder blades  Painful or persistently difficult swallowing  New shortness of breath  Fever of 100F or  higher  Black, tarry-looking stools  For urgent or emergent issues, a gastroenterologist can be reached at any hour by calling [phone]. Do not use MyChart messaging for urgent concerns.    DIET:  We do recommend a small meal at first, but then you may proceed to your regular diet.  Drink plenty of fluids but you should avoid alcoholic beverages for 24 hours.  ACTIVITY:  You should plan to take it easy for the rest of today and you should NOT DRIVE or use heavy machinery until tomorrow (because of the sedation medicines used during the test).    FOLLOW UP: Our staff will call the number listed on your records 48-72 hours following your procedure to check on you and address any questions or concerns that you may have regarding the information given to you following your procedure. If we do not reach you, we will leave a message.  We will attempt to reach you two times.  During this call, we will ask if you have developed any symptoms of COVID 19. If you develop any symptoms (ie: fever, flu-like symptoms, shortness of breath, cough etc.) before then, please call [phone].  If you test positive for Covid 19 in the 2 weeks post procedure, please call and report this information to [place].    If any biopsies were taken you will be contacted by phone or by letter within the next 1-3 weeks.  Please call us at [phone] if you have not  heard about the biopsies in 3 weeks.    SIGNATURES/CONFIDENTIALITY: You and/or your care partner have signed paperwork which will be entered into your electronic medical record.  These signatures attest to the fact that that the information above on your After Visit Summary has been reviewed and is understood.  Full responsibility of the confidentiality of this discharge information lies with you and/or your care-partner.

## 2020-03-24 NOTE — Op Note (Addendum)
[place] Patient Name: Gabrielle Page Procedure Date: 03/24/2020 3:03 PM MRN: [id number] Endoscopist: Thornton Park MD, MD Age: 60 Referring MD:  Date of Birth: April 28, 1960 Gender: Female Account #: [id number] Procedure:                Upper GI endoscopy Indications:              Dysphagia/choking with solids                           - Similar symptoms have improved with empiric                            dilation in the past                           - EGD with +/- dilation +/- biopsies to evaluate                            for reflux, infectious esophagitis, and EOE                           - Consider MBBS in the future given potential for                            oral pharyngeal component to her dysphagia                           Bloating and abdominal discomfort                           - Etiology unclear. Prior CT at [facility] in 2019                            showed no cause.                           Barrett's Esophagus on EGD 2019 at [facility]                           - EGD with surveillance biopsies recommended                           - Continue daily proton pump inhibitor                           Gastric ulcer bleed requiring hospitalization at                            [facility] 2019                           - Likely due to NSAIDs as gastric biopsies were                            negative for H. Pylori                           -  Repeat EGD recommended giving ongoing abdominal                            discomfort and bloating Medicines:                Monitored Anesthesia Care Procedure:                Pre-Anesthesia Assessment:                           - Prior to the procedure, a History and Physical                            was performed, and patient medications and                            allergies were reviewed. The patient's tolerance of                            previous anesthesia was also reviewed. The risks                             and benefits of the procedure and the sedation                            options and risks were discussed with the patient.                            All questions were answered, and informed consent                            was obtained. Prior Anticoagulants: The patient has                            taken no previous anticoagulant or antiplatelet                            agents. ASA Grade Assessment: III - A patient with                            severe systemic disease. After reviewing the risks                            and benefits, the patient was deemed in                            satisfactory condition to undergo the procedure.                           After obtaining informed consent, the endoscope was                            passed under direct vision. Throughout the  procedure, the patient's blood pressure, pulse, and                            oxygen saturations were monitored continuously. The                            Endoscope was introduced through the mouth, and                            advanced to the third part of duodenum. The upper                            GI endoscopy was accomplished without difficulty.                            The patient tolerated the procedure well. Scope In: Scope Out: Findings:                 The Z-line was irregular and was found 34 cm from                            the incisors. One <1cm tongue present. Biopsies                            were taken with a cold forceps for histology.                            Estimated blood loss was minimal.                           The esophageal lumen otherwise appeared normal. A                            TTS dilator was passed through the scope. Dilation                            with a 16-17-18 mm balloon dilator was performed to                            18 mm. There was no resistance to a fully inflated                             balloon. The dilation site was examined and showed                            no change.                           Diffuse minimal inflammation characterized by                            erythema, friability and granularity was found in  the gastric body. The antrum has a nodular                            appearance. Biopsies were taken from the antrum,                            body, and fundus with a cold forceps for histology.                            Estimated blood loss was minimal.                           Hill Grade 4 hiatal hernia present. No Cameron's                            ulcers present.                           The examined duodenum was normal. Biopsies were                            taken with a cold forceps for histology. Estimated                            blood loss was minimal. Complications:            No immediate complications. Estimated blood loss:                            Minimal. Estimated Blood Loss:     Estimated blood loss was minimal. Impression:               - Z-line irregular, 34 cm from the incisors.                            Biopsied.                           - Normal appearing esophageal lumen. Empiric                            dilation performed.                           - Gastritis. Biopsied.                           - Medium-sized hiatal hernia.                           - Normal examined duodenum. Biopsied. Recommendation:           - Patient has a contact number available for                            emergencies. The signs and symptoms of potential  delayed complications were discussed with the                            patient. Return to normal activities tomorrow.                            Written discharge instructions were provided to the                            patient.                           - Resume previous diet.                           - Continue present  medications.                           - Await pathology results.                           - No aspirin, ibuprofen, naproxen, or other                            non-steroidal anti-inflammatory drugs.                           - Proceed with colonoscopy as previously planned. Thornton Park MD, MD 03/24/2020 3:45:02 PM This report has been signed electronically. Addendum Number: 1   Addendum Date: 03/25/2020 4:41:14 PM      When fully inflated, I was able to advance the dilator through the GE       junction without resistance. After balloon dilation, biopsies were taken       from the distal esophagus and mid/proximal esophagus to evaluate for       eosinophilic esophagitis. Thornton Park MD, MD 03/25/2020 4:45:54 PM This report has been signed electronically.

## 2020-03-25 ENCOUNTER — Observation Stay (HOSPITAL_COMMUNITY): Payer: Medicaid Other

## 2020-03-25 ENCOUNTER — Telehealth: Payer: Self-pay

## 2020-03-25 ENCOUNTER — Emergency Department (HOSPITAL_COMMUNITY): Payer: Medicaid Other

## 2020-03-25 ENCOUNTER — Encounter (HOSPITAL_COMMUNITY)
Admission: EM | Disposition: A | Discharge status: Home / Self Care | Payer: Self-pay | Source: Home / Self Care | Attending: Family Medicine

## 2020-03-25 ENCOUNTER — Encounter (HOSPITAL_COMMUNITY): Payer: Self-pay

## 2020-03-25 ENCOUNTER — Other Ambulatory Visit: Payer: Self-pay

## 2020-03-25 ENCOUNTER — Observation Stay (HOSPITAL_COMMUNITY): Payer: Medicaid Other | Admitting: Certified Registered"

## 2020-03-25 ENCOUNTER — Inpatient Hospital Stay (HOSPITAL_COMMUNITY)
Admission: EM | Admit: 2020-03-25 | Discharge: 2020-03-28 | DRG: 919 | Disposition: A | Discharge status: Home / Self Care | Payer: Medicaid Other | Attending: Family Medicine | Admitting: Family Medicine

## 2020-03-25 DIAGNOSIS — K223 Perforation of esophagus: Secondary | ICD-10-CM

## 2020-03-25 DIAGNOSIS — I5032 Chronic diastolic (congestive) heart failure: Secondary | ICD-10-CM | POA: Diagnosis present

## 2020-03-25 DIAGNOSIS — K2211 Ulcer of esophagus with bleeding: Secondary | ICD-10-CM | POA: Diagnosis not present

## 2020-03-25 DIAGNOSIS — K920 Hematemesis: Secondary | ICD-10-CM | POA: Diagnosis not present

## 2020-03-25 DIAGNOSIS — K219 Gastro-esophageal reflux disease without esophagitis: Secondary | ICD-10-CM | POA: Diagnosis present

## 2020-03-25 DIAGNOSIS — R1084 Generalized abdominal pain: Secondary | ICD-10-CM | POA: Diagnosis not present

## 2020-03-25 DIAGNOSIS — K922 Gastrointestinal hemorrhage, unspecified: Secondary | ICD-10-CM

## 2020-03-25 DIAGNOSIS — Z8711 Personal history of peptic ulcer disease: Secondary | ICD-10-CM

## 2020-03-25 DIAGNOSIS — F32A Depression, unspecified: Secondary | ICD-10-CM | POA: Diagnosis present

## 2020-03-25 DIAGNOSIS — K449 Diaphragmatic hernia without obstruction or gangrene: Secondary | ICD-10-CM | POA: Diagnosis present

## 2020-03-25 DIAGNOSIS — K91840 Postprocedural hemorrhage and hematoma of a digestive system organ or structure following a digestive system procedure: Principal | ICD-10-CM | POA: Diagnosis present

## 2020-03-25 DIAGNOSIS — I959 Hypotension, unspecified: Secondary | ICD-10-CM | POA: Diagnosis present

## 2020-03-25 DIAGNOSIS — Z8249 Family history of ischemic heart disease and other diseases of the circulatory system: Secondary | ICD-10-CM

## 2020-03-25 DIAGNOSIS — D62 Acute posthemorrhagic anemia: Secondary | ICD-10-CM | POA: Diagnosis present

## 2020-03-25 DIAGNOSIS — Z951 Presence of aortocoronary bypass graft: Secondary | ICD-10-CM

## 2020-03-25 DIAGNOSIS — Z9289 Personal history of other medical treatment: Secondary | ICD-10-CM

## 2020-03-25 DIAGNOSIS — R109 Unspecified abdominal pain: Secondary | ICD-10-CM | POA: Diagnosis not present

## 2020-03-25 DIAGNOSIS — I252 Old myocardial infarction: Secondary | ICD-10-CM

## 2020-03-25 DIAGNOSIS — Z8719 Personal history of other diseases of the digestive system: Secondary | ICD-10-CM

## 2020-03-25 DIAGNOSIS — E785 Hyperlipidemia, unspecified: Secondary | ICD-10-CM | POA: Diagnosis present

## 2020-03-25 DIAGNOSIS — Z8371 Family history of colonic polyps: Secondary | ICD-10-CM

## 2020-03-25 DIAGNOSIS — D508 Other iron deficiency anemias: Secondary | ICD-10-CM | POA: Diagnosis not present

## 2020-03-25 DIAGNOSIS — Z20822 Contact with and (suspected) exposure to covid-19: Secondary | ICD-10-CM | POA: Diagnosis present

## 2020-03-25 DIAGNOSIS — M6289 Other specified disorders of muscle: Secondary | ICD-10-CM

## 2020-03-25 DIAGNOSIS — Z79899 Other long term (current) drug therapy: Secondary | ICD-10-CM

## 2020-03-25 DIAGNOSIS — F419 Anxiety disorder, unspecified: Secondary | ICD-10-CM

## 2020-03-25 DIAGNOSIS — Z833 Family history of diabetes mellitus: Secondary | ICD-10-CM

## 2020-03-25 DIAGNOSIS — I25119 Atherosclerotic heart disease of native coronary artery with unspecified angina pectoris: Secondary | ICD-10-CM

## 2020-03-25 DIAGNOSIS — Y848 Other medical procedures as the cause of abnormal reaction of the patient, or of later complication, without mention of misadventure at the time of the procedure: Secondary | ICD-10-CM | POA: Diagnosis present

## 2020-03-25 DIAGNOSIS — I110 Hypertensive heart disease with heart failure: Secondary | ICD-10-CM | POA: Diagnosis present

## 2020-03-25 DIAGNOSIS — Z7982 Long term (current) use of aspirin: Secondary | ICD-10-CM

## 2020-03-25 DIAGNOSIS — Z955 Presence of coronary angioplasty implant and graft: Secondary | ICD-10-CM

## 2020-03-25 DIAGNOSIS — E876 Hypokalemia: Secondary | ICD-10-CM | POA: Diagnosis present

## 2020-03-25 DIAGNOSIS — I2510 Atherosclerotic heart disease of native coronary artery without angina pectoris: Secondary | ICD-10-CM | POA: Diagnosis present

## 2020-03-25 HISTORY — DX: Gastrointestinal hemorrhage, unspecified: K92.2

## 2020-03-25 HISTORY — PX: ESOPHAGOGASTRODUODENOSCOPY (EGD) WITH PROPOFOL: SHX5813

## 2020-03-25 HISTORY — PX: HEMOSTASIS CLIP PLACEMENT: SHX6857

## 2020-03-25 LAB — CBC
HCT: 20.4 % — ABNORMAL LOW (ref 36.0–46.0)
Hemoglobin: 6.3 g/dL — CL (ref 12.0–15.0)
MCH: 27.8 pg (ref 26.0–34.0)
MCHC: 30.9 g/dL (ref 30.0–36.0)
MCV: 89.9 fL (ref 80.0–100.0)
Platelets: 391 10*3/uL (ref 150–400)
RBC: 2.27 MIL/uL — ABNORMAL LOW (ref 3.87–5.11)
RDW: 19.7 % — ABNORMAL HIGH (ref 11.5–15.5)
WBC: 10.9 10*3/uL — ABNORMAL HIGH (ref 4.0–10.5)
nRBC: 0 % (ref 0.0–0.2)

## 2020-03-25 LAB — BASIC METABOLIC PANEL
Anion gap: 13 (ref 5–15)
BUN: 29 mg/dL — ABNORMAL HIGH (ref 6–20)
CO2: 17 mmol/L — ABNORMAL LOW (ref 22–32)
Calcium: 8.7 mg/dL — ABNORMAL LOW (ref 8.9–10.3)
Chloride: 113 mmol/L — ABNORMAL HIGH (ref 98–111)
Creatinine, Ser: 0.72 mg/dL (ref 0.44–1.00)
GFR, Estimated: >60 mL/min (ref >60)
Glucose, Bld: 103 mg/dL — ABNORMAL HIGH (ref 70–99)
Potassium: 2.9 mmol/L — ABNORMAL LOW (ref 3.5–5.1)
Sodium: 143 mmol/L (ref 135–145)

## 2020-03-25 LAB — COMPREHENSIVE METABOLIC PANEL
ALT: 13 U/L (ref 0–44)
AST: 18 U/L (ref 15–41)
Albumin: 3.5 g/dL (ref 3.5–5.0)
Alkaline Phosphatase: 65 U/L (ref 38–126)
Anion gap: 14 (ref 5–15)
BUN: 29 mg/dL — ABNORMAL HIGH (ref 6–20)
CO2: 16 mmol/L — ABNORMAL LOW (ref 22–32)
Calcium: 8.8 mg/dL — ABNORMAL LOW (ref 8.9–10.3)
Chloride: 112 mmol/L — ABNORMAL HIGH (ref 98–111)
Creatinine, Ser: 0.82 mg/dL (ref 0.44–1.00)
GFR, Estimated: >60 mL/min (ref >60)
Glucose, Bld: 129 mg/dL — ABNORMAL HIGH (ref 70–99)
Potassium: 2.9 mmol/L — ABNORMAL LOW (ref 3.5–5.1)
Sodium: 142 mmol/L (ref 135–145)
Total Bilirubin: 0.5 mg/dL (ref 0.3–1.2)
Total Protein: 5.4 g/dL — ABNORMAL LOW (ref 6.5–8.1)

## 2020-03-25 LAB — RESP PANEL BY RT-PCR (FLU A&B, COVID) ARPGX2
Influenza A by PCR: NEGATIVE
Influenza B by PCR: NEGATIVE
SARS Coronavirus 2 by RT PCR: NEGATIVE

## 2020-03-25 LAB — CBG MONITORING, ED: Glucose-Capillary: 122 mg/dL — ABNORMAL HIGH (ref 70–99)

## 2020-03-25 LAB — PROTIME-INR
INR: 1.2 (ref 0.8–1.2)
Prothrombin Time: 15.1 seconds (ref 11.4–15.2)

## 2020-03-25 LAB — PREPARE RBC (CROSSMATCH)

## 2020-03-25 LAB — MAGNESIUM: Magnesium: 1 mg/dL — ABNORMAL LOW (ref 1.7–2.4)

## 2020-03-25 SURGERY — ESOPHAGOGASTRODUODENOSCOPY (EGD) WITH PROPOFOL
Anesthesia: Monitor Anesthesia Care

## 2020-03-25 MED ORDER — PHENYLEPHRINE 40 MCG/ML (10ML) SYRINGE FOR IV PUSH (FOR BLOOD PRESSURE SUPPORT)
PREFILLED_SYRINGE | INTRAVENOUS | Status: DC | PRN
  Administered 2020-03-25: 80 ug via INTRAVENOUS

## 2020-03-25 MED ORDER — SODIUM CHLORIDE 0.9 % IV BOLUS
1000.0000 mL | Freq: Once | INTRAVENOUS | Status: AC
  Administered 2020-03-25: 1000 mL via INTRAVENOUS

## 2020-03-25 MED ORDER — SODIUM CHLORIDE 0.9 % IV SOLN
80.0000 mg | Freq: Once | INTRAVENOUS | Status: DC
  Filled 2020-03-25: qty 80

## 2020-03-25 MED ORDER — MORPHINE SULFATE (PF) 2 MG/ML IV SOLN
2.0000 mg | INTRAVENOUS | Status: DC | PRN
  Administered 2020-03-26: 2 mg via INTRAVENOUS
  Filled 2020-03-25: qty 1

## 2020-03-25 MED ORDER — SODIUM CHLORIDE 0.9 % IV SOLN
8.0000 mg/h | INTRAVENOUS | Status: DC
  Administered 2020-03-25 – 2020-03-27 (×5): 8 mg/h via INTRAVENOUS
  Filled 2020-03-25 (×7): qty 80

## 2020-03-25 MED ORDER — FENTANYL CITRATE (PF) 100 MCG/2ML IJ SOLN
50.0000 ug | Freq: Once | INTRAMUSCULAR | Status: AC
Start: 2020-03-25 — End: 2020-03-25
  Administered 2020-03-25: 50 ug via INTRAVENOUS
  Filled 2020-03-25: qty 2

## 2020-03-25 MED ORDER — MIDODRINE HCL 5 MG PO TABS
10.0000 mg | ORAL_TABLET | Freq: Four times a day (QID) | ORAL | Status: DC
  Administered 2020-03-25 – 2020-03-28 (×10): 10 mg via ORAL
  Filled 2020-03-25 (×11): qty 2

## 2020-03-25 MED ORDER — MORPHINE SULFATE (PF) 4 MG/ML IV SOLN
4.0000 mg | Freq: Once | INTRAVENOUS | Status: AC
Start: 2020-03-25 — End: 2020-03-25
  Administered 2020-03-25: 4 mg via INTRAVENOUS
  Filled 2020-03-25: qty 1

## 2020-03-25 MED ORDER — LIDOCAINE 2% (20 MG/ML) 5 ML SYRINGE
INTRAMUSCULAR | Status: DC | PRN
  Administered 2020-03-25: 40 mg via INTRAVENOUS

## 2020-03-25 MED ORDER — ONDANSETRON HCL 4 MG/2ML IJ SOLN
4.0000 mg | Freq: Once | INTRAMUSCULAR | Status: AC
  Administered 2020-03-25: 4 mg via INTRAVENOUS
  Filled 2020-03-25: qty 2

## 2020-03-25 MED ORDER — SODIUM CHLORIDE 0.9 % IV SOLN: 10.0000 mL/h | Freq: Once | INTRAVENOUS | Status: DC

## 2020-03-25 MED ORDER — PROPOFOL 500 MG/50ML IV EMUL
INTRAVENOUS | Status: DC | PRN
  Administered 2020-03-25: 200 ug/kg/min via INTRAVENOUS

## 2020-03-25 MED ORDER — LORAZEPAM 2 MG/ML IJ SOLN
0.5000 mg | Freq: Once | INTRAMUSCULAR | Status: AC
  Administered 2020-03-25: 0.5 mg via INTRAVENOUS
  Filled 2020-03-25: qty 1

## 2020-03-25 MED ORDER — POTASSIUM CHLORIDE 10 MEQ/100ML IV SOLN: 10.0000 meq | INTRAVENOUS | Status: DC

## 2020-03-25 MED ORDER — PANTOPRAZOLE SODIUM 40 MG IV SOLR: 40.0000 mg | Freq: Two times a day (BID) | INTRAVENOUS | Status: DC

## 2020-03-25 MED ORDER — SODIUM CHLORIDE 0.9 % IV SOLN
80.0000 mg | Freq: Once | INTRAVENOUS | Status: DC
  Administered 2020-03-25: 80 mg via INTRAVENOUS
  Filled 2020-03-25: qty 80

## 2020-03-25 MED ORDER — SODIUM CHLORIDE 0.9 % IV SOLN: INTRAVENOUS | Status: DC

## 2020-03-25 MED ORDER — LORAZEPAM 2 MG/ML IJ SOLN
2.0000 mg | INTRAMUSCULAR | Status: DC | PRN
  Administered 2020-03-26 (×2): 2 mg via INTRAVENOUS
  Filled 2020-03-25 (×2): qty 1

## 2020-03-25 MED ORDER — POTASSIUM CHLORIDE 10 MEQ/100ML IV SOLN
10.0000 meq | INTRAVENOUS | Status: AC
  Administered 2020-03-25 – 2020-03-26 (×4): 10 meq via INTRAVENOUS
  Filled 2020-03-25 (×3): qty 100

## 2020-03-25 MED ORDER — IOHEXOL 300 MG/ML  SOLN
100.0000 mL | Freq: Once | INTRAMUSCULAR | Status: AC | PRN
  Administered 2020-03-25: 100 mL via INTRAVENOUS

## 2020-03-25 MED ORDER — MIDAZOLAM HCL 2 MG/2ML IJ SOLN
INTRAMUSCULAR | Status: AC
  Filled 2020-03-25: qty 2

## 2020-03-25 MED ORDER — POTASSIUM CHLORIDE 10 MEQ/100ML IV SOLN
10.0000 meq | INTRAVENOUS | Status: AC
  Filled 2020-03-25: qty 100

## 2020-03-25 MED ORDER — MIDAZOLAM HCL 5 MG/5ML IJ SOLN
INTRAMUSCULAR | Status: DC | PRN
  Administered 2020-03-25: 2 mg via INTRAVENOUS

## 2020-03-25 MED ORDER — LACTATED RINGERS IV SOLN: INTRAVENOUS | Status: DC | PRN

## 2020-03-25 SURGICAL SUPPLY — 14 items

## 2020-03-25 NOTE — Telephone Encounter (Signed)
Pt states she has been vomiting blood since last night. States it started out as BRB but now if black. Reports she has been having diarrhea since last night and that the diarrhea is black. Discussed with pt that she needs to go to the ER to be evaluated. Pt verbalized understanding and Dr. Tarri Glenn notified.

## 2020-03-25 NOTE — Telephone Encounter (Signed)
Referral entered in epic. 

## 2020-03-25 NOTE — Progress Notes (Signed)
MD at bedside to see patient

## 2020-03-25 NOTE — Interval H&P Note (Signed)
History and Physical Interval Note: Patient went for CT chest / abdomen / pelvis with IV contrast. I reviewed it with radiology. There is no evidence of any perforation or bowel injury that is obvious. There is no clear cause for her pain on the CT scan. There is also no extravasation of contrast to suggest active or significant bleeding right now. The patient has been receiving PRBC and hemodynamically stable. I have recommended EGD as the next step to evaluate / treat her bleeding. I have discussed risks / benefits of EGD and anesthesia, risks to include bowel injury / perforation, bleeding, etc. She understands and wishes to proceed. Further recommendations pending the results of the exam.      03/25/2020 3:27 PM  Gabrielle Page  has presented today for surgery, with the diagnosis of upper gi bleed.  The various methods of treatment have been discussed with the patient and family. After consideration of risks, benefits and other options for treatment, the patient has consented to  Procedure(s): ESOPHAGOGASTRODUODENOSCOPY (EGD) WITH PROPOFOL (N/A) as a surgical intervention.  The patient's history has been reviewed, patient examined, no change in status, stable for surgery.  I have reviewed the patient's chart and labs.  Questions were answered to the patient's satisfaction.     [place]

## 2020-03-25 NOTE — Telephone Encounter (Signed)
She asked which ER she should go to and we discussed either [facility] or [facility], which ever one she preferred but she did not specify.

## 2020-03-25 NOTE — H&P (Signed)
[facility] Admission History and Physical Service Pager: [phone]  Patient name: Gabrielle Page Medical record number: [id number] Date of birth: [birth year] Age: 60 y.o. Gender: female  Primary Care Provider: Jaclynn Major, NP Consultants: GI Code Status: Full  Preferred Emergency Contact: Gabrielle Page (significant other) [phone]  Chief Complaint: Hematemesis and melena  Assessment and Plan: Gabrielle Page is a 60 y.o. female presenting with hematemesis and abdominal pain . PMH is significant for  CAD (s/p 3v CABG), MI, CHF, Barrett's esophagus (2019), chronic constipation, GERD, hx of gastric ulcer bleed, IBS.   Acute blood loss anemia, symptomatic  Patient presents with hematemesis and blood in stool after colonoscopy and endoscopy (with balloon dilation) on 2/10 with removal of polyps. Vitals significant for hypotension of 79/42 without tachycardia. GI was consulted in the ED and evaluated with plans for an EGD this afternoon. Xray negative for free air. Hgb on admission 6.3 (baseline 10s), in the ED was transfused 2U PRBC. PT/INR 15.1/1.2. Likely from a GI source for bleeding given patient's hematemesis and melena as well as increased abdominal pain. Patient denies bleeding from other sites and no hematuria. Patient does have intermittent chronic pains including chest, abdominal, inspiratory, and headaches that are present. Patient also reports intermittent and chronic palpitations.  - Admit to [facility], attending Dr. Thompson Grayer - GI consulted, appreciate work with patient and recs - Vitals per routine - Trend Hgb and repeat Hgb after transfusions - Transfusion threshold 8 - F/u CTA chest and abd/pelvis - Continuous cardiac monitoring - Up with assistance - IV Protonix  Hematemesis and melena Patient reports multiple episodes of bright red hematemesis on 2/10 after colonoscopy and endoscopy on followed by addition of melena on morning of  admission. Likely related to GI bleed due to patient's symptoms and presentation. - GI consulted appreciate recs - Per GI note, patient to have upper endoscopy this afternoon  Hypotension On admission, patient hypotensive to 79/42, was given 1L bolus. Likely secondary to symptomatic anemia, anticipate improvement with blood transfusions and fluids.  - Continue to monitor BP - Up with assistance  Hypokalemia K 2.9 on admission, repleted with 26mEq via 56mEq runs. - Monitor with repeat BMP - Daily BMPs  Chronic conditions (awaiting full med rec as confirmation of meds) CAD - currently holding ASA, metoprolol, nitroglycerin  HLD - holding atorvastatin as NPO CHF - holding furosemide  Depression - holding trazodone, sertraline, buspirone, trazodone IBS/chronic consitpation - holding libiprostone   FEN/GI: NPO, Protonix Prophylaxis: SCDs  Disposition: Med-tele  History of Present Illness:  Gabrielle Page is a 60 y.o. female presenting with vomiting blood and black stools.  Patient reports for the last 2 to 3 days she has had some increasing abdominal pain and on 2/10 underwent a procedure for upper and lower endoscopy.  She states that she got home from the procedure last night and began to experience stomach pain and vomited bright red blood about 6 times.  She went to sleep hoping that she would wake up better, but then she began to throw up black clots and started having black tarry stools.  Patient reports that she was extremely weak and that her boyfriend had to carry her to the car and she was brought to the ED for further evaluation.  Patient reports that a similar episode happened several years ago in which she had black stools and weakness.  She was brought to the hospital and found to have ulcers.  Patient reports that she recently got over  an URI and finished a 10-day course of antibiotics on 2/7.   Review Of Systems: Per HPI with the following additions:   Review of  Systems  Constitutional: Positive for fatigue.  HENT: Negative for rhinorrhea, sinus pressure and sinus pain.   Eyes: Negative for visual disturbance.  Respiratory: Positive for cough and shortness of breath. Chest tightness: chronic, unchanging.   Cardiovascular: Positive for palpitations. Negative for chest pain.  Gastrointestinal: Positive for abdominal pain, blood in stool and vomiting.  Genitourinary: Negative for dysuria and vaginal bleeding.  Neurological: Positive for tremors (chronic), weakness, light-headedness and headaches. Negative for syncope.     Patient Active Problem List   Diagnosis Date Noted  . GI bleed 03/25/2020  . Pneumonia   . History of kidney stones   . Heart murmur   . Headache   . Coronary artery disease   . Chronic bronchitis ([place])   . Asthma   . Abnormal abdominal CT scan 05/14/2019  . Anemia 05/14/2019  . Constipation 05/14/2019  . Irritable bowel syndrome 05/14/2019  . Therapeutic opioid induced constipation 05/14/2019  . Fecal impaction ([place]) 05/14/2019  . History of peptic ulcer 05/14/2019  . HTN (hypertension) 05/14/2019  . Severe mitral regurgitation 05/14/2019  . Syncope 05/14/2019  . Anxiety 05/14/2019  . Iron deficiency anemia 03/07/2019  . Orthostatic hypotension   . Coronary artery disease involving native coronary artery of native heart with unstable angina pectoris ([place])   . Unstable angina ([place]) 03/04/2019  . HLD (hyperlipidemia) 11/27/2018  . Acute ST elevation myocardial infarction (STEMI) of inferolateral wall ([place]) 11/24/2018  . Chronic diastolic heart failure ([place]) 04/17/2017  . Hypokalemia 04/17/2017  . Coronary artery disease involving native coronary artery with angina pectoris ([place]) 04/08/2017  . Pericardial effusion 04/08/2017  . GERD (gastroesophageal reflux disease) 04/08/2017  . 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 LBBB (left bundle branch block) 11/05/2016  . NSTEMI (non-ST elevated myocardial infarction) ([place]) 11/05/2016   . Pancreatitis 1991  . Hepatitis 1991  . History of blood transfusion 1985    Past Medical History: Past Medical History:  Diagnosis Date  . Abnormal abdominal CT scan 05/14/2019  . Acute ST elevation myocardial infarction (STEMI) of inferolateral wall ([place]) 11/24/2018  . Anemia 05/14/2019  . Anxiety 05/14/2019  . Asthma   . CHF (congestive heart failure) ([place])   . Chronic bronchitis ([place])    "usually get it q yr" (11/05/2016)  . Chronic diastolic heart failure ([place]) 04/17/2017  . Constipation 05/14/2019  . Coronary artery disease   . Coronary artery disease involving native coronary artery of native heart with unstable angina pectoris ([place])   . Coronary artery disease involving native coronary artery with angina pectoris ([place]) 04/08/2017  . Fecal impaction ([place]) 05/14/2019  . GERD (gastroesophageal reflux disease)   . Headache    "weekly" (11/05/2016)  . Heart murmur    "dx'd when I was a child"  . Hepatitis 1991   "when I had my gallbladder out" (11/05/2016)  . History of blood transfusion 1985   S/P childbirth  . History of kidney stones   . History of peptic ulcer 05/14/2019  . HLD (hyperlipidemia) 11/27/2018  . HTN (hypertension) 05/14/2019  . Hypokalemia 04/17/2017  . Iron deficiency anemia 03/07/2019  . Irritable bowel syndrome 05/14/2019  . Kidney stones   . LBBB (left bundle branch block) 11/05/2016  . NSTEMI (non-ST elevated myocardial infarction) ([place]) 11/05/2016   9/18 PCI/DESx1 to p/mLAD  . Orthostatic hypotension   . Pancreatitis  1991  . Pericardial effusion 04/08/2017  . Pneumonia    "twice" (11/05/2016)  . 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 Seizures ([place])   . Severe mitral regurgitation 05/14/2019  . Syncope 05/14/2019  . Therapeutic opioid induced constipation 05/14/2019  . Unstable angina ([place]) 03/04/2019    Past Surgical History: Past Surgical History:  Procedure Laterality Date  . ABDOMINAL HYSTERECTOMY  1990  . ANKLE FRACTURE SURGERY  2021   left  . APPENDECTOMY  1992  . BILE  DUCT EXPLORATION  1991   "reconstruction"  . CESAREAN SECTION  1985  . CHOLECYSTECTOMY OPEN  1991  . COLONOSCOPY    . CORONARY ANGIOPLASTY WITH STENT PLACEMENT  11/05/2016  . CORONARY ARTERY BYPASS GRAFT N/A 03/26/2017   Procedure: CORONARY ARTERY BYPASS GRAFTING (CABG) x 3 WITH ENDOSCOPIC HARVESTING OF RIGHT SAPHENOUS VEIN;  Surgeon: Ivin Poot, MD;  Location: [facility];  Service: Open Heart Surgery;  Laterality: N/A;  . CORONARY STENT INTERVENTION N/A 11/05/2016   Procedure: CORONARY STENT INTERVENTION;  Surgeon: Martinique, Peter M, MD;  Location: [facility];  Service: Cardiovascular;  Laterality: N/A;  . CYSTOSCOPY WITH STENT PLACEMENT  "3 times" (11/05/2016)  . DILATION AND CURETTAGE OF UTERUS  1989  . INTRAVASCULAR ULTRASOUND/IVUS N/A 03/22/2017   Procedure: Intravascular Ultrasound/IVUS;  Surgeon: Martinique, Peter M, MD;  Location: [facility];  Service: Cardiovascular;  Laterality: N/A;  . KNEE ARTHROSCOPY Right 2000s  . LEFT HEART CATH AND CORONARY ANGIOGRAPHY N/A 11/05/2016   Procedure: LEFT HEART CATH AND CORONARY ANGIOGRAPHY;  Surgeon: Martinique, Peter M, MD;  Location: [facility];  Service: Cardiovascular;  Laterality: N/A;  . LEFT HEART CATH AND CORONARY ANGIOGRAPHY N/A 03/22/2017   Procedure: LEFT HEART CATH AND CORONARY ANGIOGRAPHY;  Surgeon: Martinique, Peter M, MD;  Location: [facility];  Service: Cardiovascular;  Laterality: N/A;  . LEFT HEART CATH AND CORS/GRAFTS ANGIOGRAPHY N/A 03/05/2019   Procedure: LEFT HEART CATH AND CORS/GRAFTS ANGIOGRAPHY;  Surgeon: Leonie Man, MD;  Location: [facility];  Service: Cardiovascular;  Laterality: N/A;  . NASAL SINUS SURGERY  ~ 2009   "exposed to mildew; had to have the infectoin drained; cut the outside of my face"  . OVARIAN CYST SURGERY  X 6  . RIGHT/LEFT HEART CATH AND CORONARY/GRAFT ANGIOGRAPHY N/A 11/24/2018   Procedure: RIGHT/LEFT HEART CATH AND CORONARY/GRAFT ANGIOGRAPHY;  Surgeon: Leonie Man, MD;   Location: [facility];  Service: Cardiovascular;  Laterality: N/A;  . TEE WITHOUT CARDIOVERSION N/A 03/26/2017   Procedure: TRANSESOPHAGEAL ECHOCARDIOGRAM (TEE);  Surgeon: Prescott Gum, [name], MD;  Location: [facility];  Service: Open Heart Surgery;  Laterality: N/A;  . UPPER GASTROINTESTINAL ENDOSCOPY      Social History: Social History   Tobacco Use  . Smoking status: Never Smoker  . Smokeless tobacco: Never Used  Vaping Use  . Vaping Use: Never used  Substance Use Topics  . Alcohol use: Not Currently    Comment: 11/05/2016 "maybe 1 glass of wine/month"  . Drug use: No   Additional social history: Denies alcohol, tobacco, and drug use  Please also refer to relevant sections of EMR.  Family History: Family History  Problem Relation Age of Onset  . Hypertension Father   . Alcohol abuse Father   . Heart attack Father   . Diabetes Mellitus II Father   . Suicidality Mother   . Breast cancer Sister   . Colon cancer Paternal Grandfather   .  Colon polyps Paternal Grandfather   . Esophageal cancer Neg Hx   . Rectal cancer Neg Hx   . Stomach cancer Neg Hx     Allergies and Medications: Allergies  Allergen Reactions  . Amiodarone Diarrhea and Nausea And Vomiting    In combination with colchicine  . Colchicine Diarrhea and Nausea And Vomiting    Pt states she had rnx to medication; severe V/D  . Compazine [Prochlorperazine Edisylate] Other (See Comments)    SEIZURE  . Other Other (See Comments) and Cough    Steroids cause uncontrollable coughing  . Prochlorperazine Other (See Comments)   No current facility-administered medications on file prior to encounter.   Current Outpatient Medications on File Prior to Encounter  Medication Sig Dispense Refill  . acetaminophen (TYLENOL) 325 MG tablet Take 325 mg by mouth every 6 (six) hours as needed for moderate pain or headache.     . albuterol (VENTOLIN HFA) 108 (90 Base) MCG/ACT inhaler Inhale 2 puffs into the lungs every 6 (six)  hours as needed for wheezing or shortness of breath.    Marland Kitchen aspirin EC 81 MG tablet Take 81 mg by mouth daily.    Marland Kitchen atorvastatin (LIPITOR) 80 MG tablet Take 1 tablet (80 mg total) by mouth daily at 6 PM. 30 tablet 6  . busPIRone (BUSPAR) 15 MG tablet Take 15 mg by mouth 3 (three) times daily.     . furosemide (LASIX) 40 MG tablet Take 1 tablet (40 mg total) by mouth 3 (three) times a week. 45 tablet 3  . lubiprostone (AMITIZA) 24 MCG capsule Take 1 capsule (24 mcg total) by mouth 2 (two) times daily with a meal. 60 capsule 3  . magnesium oxide (MAG-OX) 400 (241.3 Mg) MG tablet Take 1 tablet by mouth daily.    . metoprolol tartrate (LOPRESSOR) 25 MG tablet Take 1 tablet (25 mg total) by mouth 2 (two) times daily. 180 tablet 3  . Multiple Vitamin (MULTIVITAMIN WITH MINERALS) TABS tablet Take 1 tablet by mouth daily.    . nitroGLYCERIN (NITROSTAT) 0.4 MG SL tablet Place 0.4 mg under the tongue every 5 (five) minutes as needed for chest pain.    Marland Kitchen ondansetron (ZOFRAN) 4 MG tablet Take 4 mg by mouth daily as needed for nausea.    . pantoprazole (PROTONIX) 40 MG tablet Take 40 mg by mouth daily.    . potassium chloride SA (KLOR-CON) 20 MEQ tablet Take 20 mEq by mouth daily as needed.    . ranolazine (RANEXA) 1000 MG SR tablet Take 1 tablet (1,000 mg total) by mouth 2 (two) times daily. 180 tablet 2  . rOPINIRole (REQUIP) 0.5 MG tablet Take 0.5 mg by mouth daily.    . sertraline (ZOLOFT) 50 MG tablet Take 50 mg by mouth daily.    . traZODone (DESYREL) 150 MG tablet Take 300 mg by mouth at bedtime.       Objective: BP (!) 91/42   Pulse 71   Temp 98.5 F (36.9 C) (Oral)   Resp 15   SpO2 100%  Exam: General -- oriented x3, pleasant and cooperative. HEENT -- Head is normocephalic. PERRLA. EOMI. Ears, nose and throat were benign, very dry mucous membranes Neck -- supple; no bruits. Integument -- intact. No rash, erythema, or ecchymoses. Pallor noted Chest -- good expansion. CTAB, peripheral pulses  2+ Cardiac -- RRR. Systolic murmur appreciated Abdomen -- soft, tender to palpation in epigastric are and lower quadrants. No guarding present (physical exam assessment limited as  patient received pain medications). No masses palpable. Bowel sounds present. Genital, rectal and breast exam -- deferred. CNS -- cranial nerves II through XII grossly intact. Extremeties - no tenderness or effusions noted. ROM good. 5/5 bilateral strength. Dorsalis pedis pulses present and symmetrical.    Labs and Imaging: CBC BMET  Recent Labs  Lab 03/25/20 1150  WBC 10.9*  HGB 6.3*  HCT 20.4*  PLT 391   Recent Labs  Lab 03/25/20 1150  NA 142  K 2.9*  CL 112*  CO2 16*  BUN 29*  CREATININE 0.82  GLUCOSE 129*  CALCIUM 8.8*      CT CHEST ABDOMEN PELVIS W CONTRAST  Result Date: 03/25/2020 CLINICAL DATA:  History of EGD and colonoscopy yesterday with severe pain and drop in hemoglobin. EXAM: CT CHEST, ABDOMEN, AND PELVIS WITH CONTRAST TECHNIQUE: Multidetector CT imaging of the chest, abdomen and pelvis was performed following the standard protocol during bolus administration of intravenous contrast. CONTRAST:  164mL OMNIPAQUE IOHEXOL 300 MG/ML  SOLN COMPARISON:  01/22/2020 FINDINGS: CT CHEST FINDINGS Cardiovascular: The heart is upper limits of normal in size and stable. No pericardial effusion. The aorta is normal in caliber. No dissection. Stable surgical changes from coronary artery bypass surgery. Stable age advanced coronary artery calcifications and LAD stent. Mediastinum/Nodes: No mediastinal or hilar mass or adenopathy. No pneumomediastinum. There is mild distal esophageal wall thickening but no evidence for esophageal perforation. Lungs/Pleura: No pneumothorax or pleural effusion or pleural hematoma. A few scattered lower lobe pulmonary nodules are stable. Musculoskeletal: No breast masses, supraclavicular or axillary adenopathy. The bony thorax is intact. No acute findings. 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 CT ABDOMEN PELVIS FINDINGS Hepatobiliary: Stable cystic area in the left hepatic lobe. Gallbladder is surgically absent. Pneumobilia likely related to previous sphincterotomy. No worrisome hepatic lesions or evidence of acute hepatic injury. No perihepatic fluid collections/hematoma. The portal and hepatic veins are patent. Pancreas: No mass, inflammation or ductal dilatation. Spleen: Normal size.  No focal lesions. Adrenals/Urinary Tract: Adrenal glands are normal. Left renal calculus but no obstructing ureteral calculi or bladder calculi. No worrisome renal or bladder lesions are identified. Stomach/Bowel: The stomach, duodenum, small bowel and colon are unremarkable. No acute inflammatory changes, mass lesions or obstructive findings. Vascular/Lymphatic: The aorta is normal in caliber. No dissection. The branch vessels are patent. The Page venous structures are patent. No mesenteric or retroperitoneal mass or adenopathy. Small scattered lymph nodes are noted. Reproductive: Surgically absent. Other: No free air or free fluid is identified in the abdomen/pelvis. Musculoskeletal: Remote T12 and L2 compression fractures. No acute bony findings. IMPRESSION: 1. No CT findings for esophageal perforation, pneumomediastinum or retroperitoneal hematoma. No free abdominal/pelvic fluid or air. 2. Mild distal esophageal wall thickening but no mass or intramural gas. 3. Stable surgical changes from coronary artery bypass surgery. 4. Stable age advanced coronary artery calcifications and LAD stent. 5. Status post cholecystectomy and sphincterotomy with pneumobilia. 6. Left renal calculus. 7. Remote T12 and L2 compression fractures. Electronically Signed   By: Marijo Sanes M.D.   On: 03/25/2020 15:33   DG Abdomen Acute W/Chest  Result Date: 03/25/2020 CLINICAL DATA:  Epigastric abdominal pain.  Recent endoscopy. EXAM: DG ABDOMEN ACUTE WITH 1 VIEW CHEST COMPARISON:  CT scan 03/25/2019 FINDINGS: The upright chest  x-ray is unremarkable. No infiltrates, effusions or pneumothorax. There are surgical changes from coronary artery bypass surgery noted along with a coronary artery stent. The bony structures are unremarkable. Unremarkable abdominal bowel gas pattern. Moderate scattered air  throughout the colon. No findings for free air. The soft tissue shadows are maintained. Pneumobilia is noted and is a chronic finding due to prior cholecystectomy and sphincterotomy. The bony structures are intact. IMPRESSION: 1. No acute cardiopulmonary findings. 2. No plain film findings for an acute abdominal process. Electronically Signed   By: Marijo Sanes M.D.   On: 03/25/2020 12:46     Rise Patience, DO 03/25/2020, 1:32 PM PGY-1, [facility] pager: [phone], text pages welcome

## 2020-03-25 NOTE — ED Notes (Signed)
GI at bedside

## 2020-03-25 NOTE — Telephone Encounter (Signed)
Looks like pt is in the ER at [facility].

## 2020-03-25 NOTE — Telephone Encounter (Signed)
Sending as an FYI

## 2020-03-25 NOTE — Telephone Encounter (Signed)
Got it, we will keep an eye out for her. Thanks

## 2020-03-25 NOTE — Anesthesia Preprocedure Evaluation (Addendum)
Anesthesia Evaluation  Patient identified by MRN, date of birth, ID band Patient awake    Reviewed: Allergy & Precautions, H&P , NPO status , Patient's Chart, lab work & pertinent test results, reviewed documented beta blocker date and time   Airway Mallampati: II  TM Distance: >3 FB Neck ROM: full    Dental no notable dental hx. (+) Edentulous Upper, Edentulous Lower   Pulmonary neg pulmonary ROS,    Pulmonary exam normal breath sounds clear to auscultation       Cardiovascular Exercise Tolerance: Good hypertension, Pt. on medications + CAD, + Past MI and + CABG   Rhythm:regular Rate:Normal     Neuro/Psych  Headaches, Seizures -,  Anxiety negative psych ROS   GI/Hepatic GERD  ,(+) Hepatitis -  Endo/Other  negative endocrine ROS  Renal/GU Renal disease  negative genitourinary   Musculoskeletal   Abdominal   Peds  Hematology  (+) Blood dyscrasia, anemia ,   Anesthesia Other Findings   Reproductive/Obstetrics negative OB ROS                            Anesthesia Physical Anesthesia Plan  ASA: III  Anesthesia Plan: MAC   Post-op Pain Management:    Induction: Intravenous  PONV Risk Score and Plan: 3  Airway Management Planned: Nasal Cannula, Natural Airway, Mask and Simple Face Mask  Additional Equipment:   Intra-op Plan:   Post-operative Plan:   Informed Consent: I have reviewed the patients History and Physical, chart, labs and discussed the procedure including the risks, benefits and alternatives for the proposed anesthesia with the patient or authorized representative who has indicated his/her understanding and acceptance.     Dental Advisory Given  Plan Discussed with: CRNA and Anesthesiologist  Anesthesia Plan Comments:         Anesthesia Quick Evaluation

## 2020-03-25 NOTE — Transfer of Care (Signed)
Immediate Anesthesia Transfer of Care Note  Patient: Gabrielle Page  Procedure(s) Performed: ESOPHAGOGASTRODUODENOSCOPY (EGD) WITH PROPOFOL (N/A ) HEMOSTASIS CLIP PLACEMENT  Patient Location: Endoscopy Unit  Anesthesia Type:MAC  Level of Consciousness: awake, alert , oriented and patient cooperative  Airway & Oxygen Therapy: Patient Spontanous Breathing and Patient connected to nasal cannula oxygen  Post-op Assessment: Report given to RN, Post -op Vital signs reviewed and stable and Patient moving all extremities  Post vital signs: Reviewed and stable  Last Vitals:  Vitals Value Taken Time  BP 102/35 03/25/20 1605  Temp    Pulse 73 03/25/20 1607  Resp 26 03/25/20 1607  SpO2 100 % 03/25/20 1607  Vitals shown include unvalidated device data.  Last Pain:  Vitals:   03/25/20 1528  TempSrc: Oral  PainSc:          Complications: No complications documented.

## 2020-03-25 NOTE — Progress Notes (Signed)
FPTS Interim Progress Note  Notified by nurse that patient not tolerating IV K runs and appearing very anxious. Wanted to avoid tablets given GI concerns and packs given NPO status. Went to bedside to assess patient, she is very anxious. I explained the importance of potassium repletion, we decreased K rate and ordered Ativan 0.5 mg. Provided reassurance and sat with patient at bedside for about 50 minutes, by the time I left the room patient had almost received 1 bag of K. Upon leaving, patient appeared significantly more calm and tolerating IV K without complications. Patient has not yet received second unit pRBC, s/p 1 unit pRBC. Will follow up with am BMP and CBC.   Donney Dice, DO 03/25/2020, 10:04 PM PGY-1, [facility] pager [phone]

## 2020-03-25 NOTE — ED Triage Notes (Signed)
Pt reports abd pain and hematemesis since last night, pt had endoscopy and colonoscopy done yesterday, 1 polyp found. Pt hypotensive in triage. Pain 10/10

## 2020-03-25 NOTE — Consult Note (Signed)
Consultation  Referring Provider: Dr. Langston Masker     Primary Care Physician:  Jaclynn Major, NP Primary Gastroenterologist:   Dr. Tarri Glenn      Reason for Consultation: Anemia, hematemesis            HPI:   Gabrielle Page is a 60 y.o. female with a past medical history of coronary disease, MI, congestive heart failure and recent colonoscopy and endoscopy who presented to the ER today with a complaint of hematemesis and blood in her stool.    Today, the patient explains that since arriving home last night she began to feel very nauseated and starting around 730 had multiple episodes of vomiting, at first with bright red blood, which was continuous, and the last several had dark coffee-ground emesis/"globs of black stuff".  Then had some bowel movements this morning which were just black.  Tells me she tried to drink a couple sips of water but this "came right back up".  Has not had anything else to eat.  Also describes some abdominal pain in her epigastrium as well as bilateral lower quadrants which is rated as a 9-10/10, so bad that she cannot stand up straight when coming into the ER and also some "blurred vision" per the patient.  Tells me she has chronic back pain, so does have some back pain but this has not changed acutely.  Associated symptoms include lightheadedness and weakness.    Denies fever or chills.  ER course: Hemoglobin 6.3 (10.0 on 12/14/2019), WBC 10.9, BUN elevated at 29; unremarkable abdominal x-rays  GI history: 03/24/2020 EGD Dr. Tarri Glenn - Z-line irregular, 34 cm from the incisors. Biopsied. - Normal appearing esophageal lumen. Empiric dilation performed. - Gastritis. Biopsied. - Medium-sized hiatal hernia. - Normal examined duodenum. Biopsied.  03/24/2020 colonoscopy Dr. Tarri Glenn - Preparation of the colon was inadequate. - Hemorrhoids found on perianal exam. - Non-bleeding internal hemorrhoids. - One 3 mm polyp in the transverse colon, removed with a cold  snare. Complete resection. Polyp tissue not retrieved. - Stool in the entire examined colon. - The examination was otherwise normal on direct and retroflexion views.  Past Medical History:  Diagnosis Date  . Abnormal abdominal CT scan 05/14/2019  . Acute ST elevation myocardial infarction (STEMI) of inferolateral wall ([place]) 11/24/2018  . Anemia 05/14/2019  . Anxiety 05/14/2019  . Asthma   . CHF (congestive heart failure) ([place])   . Chronic bronchitis ([place])    "usually get it q yr" (11/05/2016)  . Chronic diastolic heart failure ([place]) 04/17/2017  . Constipation 05/14/2019  . Coronary artery disease   . Coronary artery disease involving native coronary artery of native heart with unstable angina pectoris ([place])   . Coronary artery disease involving native coronary artery with angina pectoris ([place]) 04/08/2017  . Fecal impaction ([place]) 05/14/2019  . GERD (gastroesophageal reflux disease)   . Headache    "weekly" (11/05/2016)  . Heart murmur    "dx'd when I was a child"  . Hepatitis 1991   "when I had my gallbladder out" (11/05/2016)  . History of blood transfusion 1985   S/P childbirth  . History of kidney stones   . History of peptic ulcer 05/14/2019  . HLD (hyperlipidemia) 11/27/2018  . HTN (hypertension) 05/14/2019  . Hypokalemia 04/17/2017  . Iron deficiency anemia 03/07/2019  . Irritable bowel syndrome 05/14/2019  . Kidney stones   . LBBB (left bundle branch block) 11/05/2016  . NSTEMI (non-ST elevated myocardial infarction) ([place]) 11/05/2016   9/18  PCI/DESx1 to p/mLAD  . Orthostatic hypotension   . Pancreatitis 1991  . Pericardial effusion 04/08/2017  . Pneumonia    "twice" (11/05/2016)  . 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 Seizures ([place])   . Severe mitral regurgitation 05/14/2019  . Syncope 05/14/2019  . Therapeutic opioid induced constipation 05/14/2019  . Unstable angina ([place]) 03/04/2019    Past Surgical History:  Procedure Laterality Date  . ABDOMINAL HYSTERECTOMY  1990  . ANKLE FRACTURE SURGERY  2021    left  . APPENDECTOMY  1992  . BILE DUCT EXPLORATION  1991   "reconstruction"  . CESAREAN SECTION  1985  . CHOLECYSTECTOMY OPEN  1991  . COLONOSCOPY    . CORONARY ANGIOPLASTY WITH STENT PLACEMENT  11/05/2016  . CORONARY ARTERY BYPASS GRAFT N/A 03/26/2017   Procedure: CORONARY ARTERY BYPASS GRAFTING (CABG) x 3 WITH ENDOSCOPIC HARVESTING OF RIGHT SAPHENOUS VEIN;  Surgeon: Ivin Poot, MD;  Location: [facility];  Service: Open Heart Surgery;  Laterality: N/A;  . CORONARY STENT INTERVENTION N/A 11/05/2016   Procedure: CORONARY STENT INTERVENTION;  Surgeon: Martinique, Peter M, MD;  Location: [facility];  Service: Cardiovascular;  Laterality: N/A;  . CYSTOSCOPY WITH STENT PLACEMENT  "3 times" (11/05/2016)  . DILATION AND CURETTAGE OF UTERUS  1989  . INTRAVASCULAR ULTRASOUND/IVUS N/A 03/22/2017   Procedure: Intravascular Ultrasound/IVUS;  Surgeon: Martinique, Peter M, MD;  Location: [facility];  Service: Cardiovascular;  Laterality: N/A;  . KNEE ARTHROSCOPY Right 2000s  . LEFT HEART CATH AND CORONARY ANGIOGRAPHY N/A 11/05/2016   Procedure: LEFT HEART CATH AND CORONARY ANGIOGRAPHY;  Surgeon: Martinique, Peter M, MD;  Location: [facility];  Service: Cardiovascular;  Laterality: N/A;  . LEFT HEART CATH AND CORONARY ANGIOGRAPHY N/A 03/22/2017   Procedure: LEFT HEART CATH AND CORONARY ANGIOGRAPHY;  Surgeon: Martinique, Peter M, MD;  Location: [facility];  Service: Cardiovascular;  Laterality: N/A;  . LEFT HEART CATH AND CORS/GRAFTS ANGIOGRAPHY N/A 03/05/2019   Procedure: LEFT HEART CATH AND CORS/GRAFTS ANGIOGRAPHY;  Surgeon: Leonie Man, MD;  Location: [facility];  Service: Cardiovascular;  Laterality: N/A;  . NASAL SINUS SURGERY  ~ 2009   "exposed to mildew; had to have the infectoin drained; cut the outside of my face"  . OVARIAN CYST SURGERY  X 6  . RIGHT/LEFT HEART CATH AND CORONARY/GRAFT ANGIOGRAPHY N/A 11/24/2018   Procedure: RIGHT/LEFT HEART CATH AND CORONARY/GRAFT  ANGIOGRAPHY;  Surgeon: Leonie Man, MD;  Location: [facility];  Service: Cardiovascular;  Laterality: N/A;  . TEE WITHOUT CARDIOVERSION N/A 03/26/2017   Procedure: TRANSESOPHAGEAL ECHOCARDIOGRAM (TEE);  Surgeon: Prescott Gum, [name], MD;  Location: [facility];  Service: Open Heart Surgery;  Laterality: N/A;  . UPPER GASTROINTESTINAL ENDOSCOPY      Family History  Problem Relation Age of Onset  . Hypertension Father   . Alcohol abuse Father   . Heart attack Father   . Diabetes Mellitus II Father   . Suicidality Mother   . Breast cancer Sister   . Colon cancer Paternal Grandfather   . Colon polyps Paternal Grandfather   . Esophageal cancer Neg Hx   . Rectal cancer Neg Hx   . Stomach cancer Neg Hx     Social History   Tobacco Use  . Smoking status: Never Smoker  . Smokeless tobacco: Never Used  Vaping Use  . Vaping Use: Never used  Substance Use Topics  . Alcohol use: Not Currently  Comment: 11/05/2016 "maybe 1 glass of wine/month"  . Drug use: No    Prior to Admission medications   Medication Sig Start Date End Date Taking? Authorizing Provider  acetaminophen (TYLENOL) 325 MG tablet Take 325 mg by mouth every 6 (six) hours as needed for moderate pain or headache.     [provider]  albuterol (VENTOLIN HFA) 108 (90 Base) MCG/ACT inhaler Inhale 2 puffs into the lungs every 6 (six) hours as needed for wheezing or shortness of breath.    [provider]  aspirin EC 81 MG tablet Take 81 mg by mouth daily.    [provider]  atorvastatin (LIPITOR) 80 MG tablet Take 1 tablet (80 mg total) by mouth daily at 6 PM. 11/27/18   Isaiah Serge, NP  busPIRone (BUSPAR) 15 MG tablet Take 15 mg by mouth 3 (three) times daily.     [provider]  furosemide (LASIX) 40 MG tablet Take 1 tablet (40 mg total) by mouth 3 (three) times a week. 12/14/19 03/13/20  Richardo Priest, MD  lubiprostone (AMITIZA) 24 MCG capsule Take 1 capsule (24 mcg total) by mouth  2 (two) times daily with a meal. 03/03/20   Thornton Park, MD  magnesium oxide (MAG-OX) 400 (241.3 Mg) MG tablet Take 1 tablet by mouth daily. 01/19/20   [provider]  metoprolol tartrate (LOPRESSOR) 25 MG tablet Take 1 tablet (25 mg total) by mouth 2 (two) times daily. 03/19/19 12/14/19  Richardo Priest, MD  Multiple Vitamin (MULTIVITAMIN WITH MINERALS) TABS tablet Take 1 tablet by mouth daily.    [provider]  nitroGLYCERIN (NITROSTAT) 0.4 MG SL tablet Place 0.4 mg under the tongue every 5 (five) minutes as needed for chest pain.    [provider]  ondansetron (ZOFRAN) 4 MG tablet Take 4 mg by mouth daily as needed for nausea. 04/09/19   [provider]  pantoprazole (PROTONIX) 40 MG tablet Take 40 mg by mouth daily. 02/27/19   [provider]  potassium chloride SA (KLOR-CON) 20 MEQ tablet Take 20 mEq by mouth daily as needed. 11/16/19   [provider]  ranolazine (RANEXA) 1000 MG SR tablet Take 1 tablet (1,000 mg total) by mouth 2 (two) times daily. 03/14/20   Richardo Priest, MD  rOPINIRole (REQUIP) 0.5 MG tablet Take 0.5 mg by mouth daily.    [provider]  sertraline (ZOLOFT) 50 MG tablet Take 50 mg by mouth daily. 11/16/19   [provider]  traZODone (DESYREL) 150 MG tablet Take 300 mg by mouth at bedtime.  09/22/18   [provider]    Current Facility-Administered Medications  Medication Dose Route Frequency Provider Last Rate Last Admin  . 0.9 %  sodium chloride infusion  10 mL/hr Intravenous Once Wyvonnia Dusky, MD      . pantoprazole (PROTONIX) 80 mg in sodium chloride 0.9 % 100 mL (0.8 mg/mL) infusion  8 mg/hr Intravenous Continuous Trifan, Carola Rhine, MD      . pantoprazole (PROTONIX) 80 mg in sodium chloride 0.9 % 100 mL IVPB  80 mg Intravenous Once Wyvonnia Dusky, MD      . Derrill Memo ON 03/29/2020] pantoprazole (PROTONIX) injection 40 mg  40 mg Intravenous Q12H Wyvonnia Dusky, MD        Current Outpatient Medications  Medication Sig Dispense Refill  . acetaminophen (TYLENOL) 325 MG tablet Take 325 mg by mouth every 6 (six) hours as needed for moderate pain or headache.     Marland Kitchen  albuterol (VENTOLIN HFA) 108 (90 Base) MCG/ACT inhaler Inhale 2 puffs into the lungs every 6 (six) hours as needed for wheezing or shortness of breath.    Marland Kitchen aspirin EC 81 MG tablet Take 81 mg by mouth daily.    Marland Kitchen atorvastatin (LIPITOR) 80 MG tablet Take 1 tablet (80 mg total) by mouth daily at 6 PM. 30 tablet 6  . busPIRone (BUSPAR) 15 MG tablet Take 15 mg by mouth 3 (three) times daily.     . furosemide (LASIX) 40 MG tablet Take 1 tablet (40 mg total) by mouth 3 (three) times a week. 45 tablet 3  . lubiprostone (AMITIZA) 24 MCG capsule Take 1 capsule (24 mcg total) by mouth 2 (two) times daily with a meal. 60 capsule 3  . magnesium oxide (MAG-OX) 400 (241.3 Mg) MG tablet Take 1 tablet by mouth daily.    . metoprolol tartrate (LOPRESSOR) 25 MG tablet Take 1 tablet (25 mg total) by mouth 2 (two) times daily. 180 tablet 3  . Multiple Vitamin (MULTIVITAMIN WITH MINERALS) TABS tablet Take 1 tablet by mouth daily.    . nitroGLYCERIN (NITROSTAT) 0.4 MG SL tablet Place 0.4 mg under the tongue every 5 (five) minutes as needed for chest pain.    Marland Kitchen ondansetron (ZOFRAN) 4 MG tablet Take 4 mg by mouth daily as needed for nausea.    . pantoprazole (PROTONIX) 40 MG tablet Take 40 mg by mouth daily.    . potassium chloride SA (KLOR-CON) 20 MEQ tablet Take 20 mEq by mouth daily as needed.    . ranolazine (RANEXA) 1000 MG SR tablet Take 1 tablet (1,000 mg total) by mouth 2 (two) times daily. 180 tablet 2  . rOPINIRole (REQUIP) 0.5 MG tablet Take 0.5 mg by mouth daily.    . sertraline (ZOLOFT) 50 MG tablet Take 50 mg by mouth daily.    . traZODone (DESYREL) 150 MG tablet Take 300 mg by mouth at bedtime.       Allergies as of 03/25/2020 - Review Complete 03/25/2020  Allergen Reaction Noted  . Amiodarone Diarrhea and  Nausea And Vomiting 04/17/2017  . Colchicine Diarrhea and Nausea And Vomiting 04/17/2017  . Compazine [prochlorperazine edisylate] Other (See Comments) 11/05/2016  . Other Other (See Comments) and Cough 11/05/2016  . Prochlorperazine Other (See Comments) 04/09/2019     Review of Systems:    Constitutional: No weight loss, fever or chills Skin: No rash  Cardiovascular: No chest pain Respiratory: No SOB  Gastrointestinal: See HPI and otherwise negative Genitourinary: No dysuria  Neurological: +dizziness Musculoskeletal: No new muscle or joint pain Hematologic: No bruising Psychiatric: No history of depression or anxiety    Physical Exam:  Vital signs in last 24 hours: Temp:  [97.9 F (36.6 C)-98.2 F (36.8 C)] 98.2 F (36.8 C) (02/11 1145) Pulse Rate:  [50-78] 68 (02/11 1200) Resp:  [14-26] 23 (02/11 1200) BP: (79-171)/(42-101) 90/54 (02/11 1200) SpO2:  [95 %-100 %] 100 % (02/11 1200) Weight:  [55.8 kg] 55.8 kg (02/10 1410)   General:   Caucasian female appears to be in mild distress, Well developed, Well nourished, alert and cooperative Head:  Normocephalic and atraumatic. Eyes:   PEERL, EOMI. No icterus. Conjunctiva pink. Ears:  Normal auditory acuity. Neck:  Supple Throat: Oral cavity and pharynx without inflammation, swelling or lesion.  Lungs: Respirations even and unlabored. Lungs clear to auscultation bilaterally.   No wheezes, crackles, or rhonchi.  Heart: Normal S1, S2. No MRG. Regular rate and rhythm. No peripheral  edema, cyanosis or pallor.  Abdomen:  Soft, nondistended, marked ttp in epigastrum and b/l lower quardants. Some involuntary guarding. Normal bowel sounds. No appreciable masses or hepatomegaly. Rectal:  Not performed.  Msk:  Symmetrical without gross deformities. Peripheral pulses intact.  Extremities:  Without edema, no deformity or joint abnormality.  Neurologic:  Alert and  oriented x4;  grossly normal neurologically.  Skin:   Dry and intact without  significant lesions or rashes. Psychiatric: Demonstrates good judgement and reason without abnormal affect or behaviors.   LAB RESULTS: Recent Labs    03/25/20 1150  WBC 10.9*  HGB 6.3*  HCT 20.4*  PLT 391   BMET Recent Labs    03/25/20 1150  NA 142  K 2.9*  CL 112*  CO2 16*  GLUCOSE 129*  BUN 29*  CREATININE 0.82  CALCIUM 8.8*   LFT Recent Labs    03/25/20 1150  PROT 5.4*  ALBUMIN 3.5  AST 18  ALT 13  ALKPHOS 65  BILITOT 0.5    Impression / Plan:   Impression: 1.  Acute blood loss anemia: Hemoglobin 6.3 from 10 on 12/14/2019, waiting to start her first unit of 2 units ordered of PRBCs, suspect this is from below 2.  Melena/hematemesis: Started yesterday around 730 after EGD and colonoscopy in clinic, see HPI for those full reports; uncertain etiology 3.  Hypokalemia: Potassium currently 2.9 4.  Abdominal pain: Epigastric and bilateral lower pain, due to severity of pain will rule out perforation  Plan: 1.  Agree with PRBC transfusion and continual monitoring of hemoglobin transfusion as needed less than 7 2.  Plans are for EGD this afternoon around 4 PM 3.  Ordered a stat CT of the chest, abdomen and pelvis with IV contrast only to consider perforation 4.  Ordered 10 mEq IV potassium x2, goal is to get her potassium above 3 which is ideal for procedure later this afternoon 5.  Agree with PPI infusion for now 6.  Please await further recommendations from Dr. Havery Moros later today.  Thank you for your kind consultation, we will continue to follow.  Lavone Nian [facility]  03/25/2020, 12:37 PM

## 2020-03-25 NOTE — Op Note (Addendum)
[facility] Patient Name: Gabrielle Page Procedure Date : 03/25/2020 MRN: [id number] Attending MD: Carlota Raspberry. Havery Moros , MD Date of Birth: 11-15-1960 CSN: [id number] Age: 60 Admit Type: Outpatient Procedure:                Upper GI endoscopy Indications:              Hematemesis / Anemia - recent EGD yesterday with                            empiric balloon dilation to 56mm for dilation, no                            mucosal wrent from balloon dilation. Biopsies of                            GEJ / esophagus and stomach taken. Patient has not                            eaten since last night, endorses multiple episode                            of vomiting bright red blood then dark blood, Hgb                            of 6. Abdominal pain, s/p CT scan without any                            evidence of bowel injury or perforation Providers:                Remo Lipps P. Havery Moros, MD, Gabrielle Rasher, RN,                            Gabrielle Page, Technician Referring MD:              Medicines:                Monitored Anesthesia Care Complications:            No immediate complications. Estimated blood loss:                            None. Estimated Blood Loss:     Estimated blood loss: none. Procedure:                Pre-Anesthesia Assessment:                           - Prior to the procedure, a History and Physical                            was performed, and patient medications and                            allergies were reviewed. The patient's tolerance of  previous anesthesia was also reviewed. The risks                            and benefits of the procedure and the sedation                            options and risks were discussed with the patient.                            All questions were answered, and informed consent                            was obtained. Prior Anticoagulants: The patient has                             taken no previous anticoagulant or antiplatelet                            agents. ASA Grade Assessment: III - A patient with                            severe systemic disease. After reviewing the risks                            and benefits, the patient was deemed in                            satisfactory condition to undergo the procedure.                           After obtaining informed consent, the endoscope was                            passed under direct vision. Throughout the                            procedure, the patient's blood pressure, pulse, and                            oxygen saturations were monitored continuously. The                            GIF-H190 ([id number]) Olympus gastroscope was                            introduced through the mouth, and advanced to the                            antrum of the stomach. The upper GI endoscopy was                            accomplished without difficulty. The patient  tolerated the procedure well. Scope In: Scope Out: Findings:      A 3 cm hiatal hernia was present. Site of biopsies from GEJ from       yesterday noted without      Two "kissing" esophageal ulcers with stigmata of recent bleeding were       found 27 cm from the incisors (best seen in image 2), roughly 38mm in       size. One ulcer had a visible vessel but no active bleeding. One       hemostatic clip was successfully placed across the base of the vessel /       ulcer. A few cm distal to this was another pair of superficial "kissing       ulcers", both clean based and smaller, perhaps 3-4 mm in size. These       could be related to prior biopsy sites.      The exam of the esophagus was otherwise normal.      A large amount of solid food was suprisingly found in the gastric body       and in the gastric antrum with old heme adherent to it. No fresh blood       in the stomach. Given the burden of solid food in the stomach,        visualization was quite poor and complete exam was not done. Quick exam       of the stomach done showed no evidence of fresh blood or active       bleeding. Previous biopsy sites not able to be seen. Given burden of       food which limited evaluation of the stomach, risk for aspiration and       length of time it would take to clear the stomach without active       bleeding currently, procedure was aborted due to pathology noted in the       esophagus to have caused symptoms which was treated, and no other areas       of fresh blood or signs of active bleeding noted. Impression:               - 3 cm hiatal hernia with recent GEJ biopsies                            noted, no evidence of bleeding there.                           - Esophageal ulcers, one with stigmata of recent                            bleeding with visible vessel. Clip was placed.                           - A large amount of food (residue) in the stomach                            which prohibited views of the stomach and small                            bowel. Given the burden of food in the stomach,  procedure was terminated given obvious pathology in                            the stomach without fresh blood noted elsewhere or                            active bleeding.                           Esophageal ulcers may be secondary to recent biopsy                            sites vs. balloon dilation trauma (?). Patient                            denies any report of pill impaction / esophagitis                            symptoms, etc, since the procedure to have caused                            this. Recommendation:           - Return patient to hospital ward for ongoing care.                           - NPO for tonight                           - Continue present medications.                           - continue IV protonix                           - Monitor Hgb post transfusion, and  assess for                            recurrent bleeding                           - May consider relook endoscopy pending her course,                            given full exam not performed at this time due to                            retained food in the stomach, although suspect                            pathology in the esophagus is the likely cause of                            symptoms                           -  GI service will follow, call with questions or                            changes in status Procedure Code(s):        --- Professional ---                           69794, 52, Esophagogastroduodenoscopy, flexible,                            transoral; with control of bleeding, any method Diagnosis Code(s):        --- Professional ---                           K44.9, Diaphragmatic hernia without obstruction or                            gangrene                           K22.11, Ulcer of esophagus with bleeding                           K92.0, Hematemesis CPT copyright 2019 [employer]. All rights reserved. The codes documented in this report are preliminary and upon coder review may  be revised to meet current compliance requirements. Remo Lipps P. Allora Bains, MD 03/25/2020 4:20:20 PM This report has been signed electronically. Number of Addenda: 0

## 2020-03-25 NOTE — ED Notes (Signed)
IV team at bedside 

## 2020-03-25 NOTE — ED Provider Notes (Addendum)
[facility] EMERGENCY DEPARTMENT Provider Note   CSN: [id number] Arrival date & time: 03/25/20  1105     History Chief Complaint  Patient presents with  . Hematemesis  . Abdominal Pain    Gabrielle Page is a 60 y.o. female with a history of coronary disease, MI, ingestive heart failure, recent colonoscopy and endoscopy, presenting to emergency department with hematemesis and blood in stool.  The patient reports she had a colonoscopy and endoscopy done yesterday with GI.  Dr Joelene Millin beavers performed procedures.  Per medical record review, colonoscopy with inadequate bowel prep significant stool in colon, single polyp removed.  EGD was performed with empiric dilation of esophagus and noted gastritis changes of the stomach lining, moderate hiatal hernia.    Patient reports since arriving home last night she began to feel very nauseated.  She had multiple episodes of vomiting, and reports the last several episodes had dark coffee-ground emesis.  She also reports black and tarry stool.  She says she has had anemia in the past and feels like this is similar.  She reports feeling very lightheaded and weak this morning.  HPI     Past Medical History:  Diagnosis Date  . Abnormal abdominal CT scan 05/14/2019  . Acute ST elevation myocardial infarction (STEMI) of inferolateral wall ([place]) 11/24/2018  . Anemia 05/14/2019  . Anxiety 05/14/2019  . Asthma   . CHF (congestive heart failure) ([place])   . Chronic bronchitis ([place])    "usually get it q yr" (11/05/2016)  . Chronic diastolic heart failure ([place]) 04/17/2017  . Constipation 05/14/2019  . Coronary artery disease   . Coronary artery disease involving native coronary artery of native heart with unstable angina pectoris ([place])   . Coronary artery disease involving native coronary artery with angina pectoris ([place]) 04/08/2017  . Fecal impaction ([place]) 05/14/2019  . GERD (gastroesophageal reflux disease)   . Headache    "weekly"  (11/05/2016)  . Heart murmur    "dx'd when I was a child"  . Hepatitis 1991   "when I had my gallbladder out" (11/05/2016)  . History of blood transfusion 1985   S/P childbirth  . History of kidney stones   . History of peptic ulcer 05/14/2019  . HLD (hyperlipidemia) 11/27/2018  . HTN (hypertension) 05/14/2019  . Hypokalemia 04/17/2017  . Iron deficiency anemia 03/07/2019  . Irritable bowel syndrome 05/14/2019  . Kidney stones   . LBBB (left bundle branch block) 11/05/2016  . NSTEMI (non-ST elevated myocardial infarction) ([place]) 11/05/2016   9/18 PCI/DESx1 to p/mLAD  . Orthostatic hypotension   . Pancreatitis 1991  . Pericardial effusion 04/08/2017  . Pneumonia    "twice" (11/05/2016)  . 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 Seizures ([place])   . Severe mitral regurgitation 05/14/2019  . Syncope 05/14/2019  . Therapeutic opioid induced constipation 05/14/2019  . Unstable angina ([place]) 03/04/2019    Patient Active Problem List   Diagnosis Date Noted  . Pneumonia   . History of kidney stones   . Heart murmur   . Headache   . Coronary artery disease   . Chronic bronchitis ([place])   . Asthma   . Abnormal abdominal CT scan 05/14/2019  . Anemia 05/14/2019  . Constipation 05/14/2019  . Irritable bowel syndrome 05/14/2019  . Therapeutic opioid induced constipation 05/14/2019  . Fecal impaction ([place]) 05/14/2019  . History of peptic ulcer 05/14/2019  . HTN (hypertension) 05/14/2019  . Severe mitral regurgitation 05/14/2019  . Syncope 05/14/2019  .  Anxiety 05/14/2019  . Iron deficiency anemia 03/07/2019  . Orthostatic hypotension   . Coronary artery disease involving native coronary artery of native heart with unstable angina pectoris ([place])   . Unstable angina ([place]) 03/04/2019  . HLD (hyperlipidemia) 11/27/2018  . Acute ST elevation myocardial infarction (STEMI) of inferolateral wall ([place]) 11/24/2018  . Chronic diastolic heart failure ([place]) 04/17/2017  . Hypokalemia 04/17/2017  . Coronary artery disease  involving native coronary artery with angina pectoris ([place]) 04/08/2017  . Pericardial effusion 04/08/2017  . GERD (gastroesophageal reflux disease) 04/08/2017  . 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 LBBB (left bundle branch block) 11/05/2016  . NSTEMI (non-ST elevated myocardial infarction) ([place]) 11/05/2016  . Pancreatitis 1991  . Hepatitis 1991  . History of blood transfusion 1985    Past Surgical History:  Procedure Laterality Date  . ABDOMINAL HYSTERECTOMY  1990  . ANKLE FRACTURE SURGERY  2021   left  . APPENDECTOMY  1992  . BILE DUCT EXPLORATION  1991   "reconstruction"  . CESAREAN SECTION  1985  . CHOLECYSTECTOMY OPEN  1991  . COLONOSCOPY    . CORONARY ANGIOPLASTY WITH STENT PLACEMENT  11/05/2016  . CORONARY ARTERY BYPASS GRAFT N/A 03/26/2017   Procedure: CORONARY ARTERY BYPASS GRAFTING (CABG) x 3 WITH ENDOSCOPIC HARVESTING OF RIGHT SAPHENOUS VEIN;  Surgeon: Ivin Poot, MD;  Location: [facility];  Service: Open Heart Surgery;  Laterality: N/A;  . CORONARY STENT INTERVENTION N/A 11/05/2016   Procedure: CORONARY STENT INTERVENTION;  Surgeon: Martinique, Peter M, MD;  Location: [facility];  Service: Cardiovascular;  Laterality: N/A;  . CYSTOSCOPY WITH STENT PLACEMENT  "3 times" (11/05/2016)  . DILATION AND CURETTAGE OF UTERUS  1989  . INTRAVASCULAR ULTRASOUND/IVUS N/A 03/22/2017   Procedure: Intravascular Ultrasound/IVUS;  Surgeon: Martinique, Peter M, MD;  Location: [facility];  Service: Cardiovascular;  Laterality: N/A;  . KNEE ARTHROSCOPY Right 2000s  . LEFT HEART CATH AND CORONARY ANGIOGRAPHY N/A 11/05/2016   Procedure: LEFT HEART CATH AND CORONARY ANGIOGRAPHY;  Surgeon: Martinique, Peter M, MD;  Location: [facility];  Service: Cardiovascular;  Laterality: N/A;  . LEFT HEART CATH AND CORONARY ANGIOGRAPHY N/A 03/22/2017   Procedure: LEFT HEART CATH AND CORONARY ANGIOGRAPHY;  Surgeon: Martinique, Peter M, MD;  Location: [facility];  Service: Cardiovascular;  Laterality: N/A;  .  LEFT HEART CATH AND CORS/GRAFTS ANGIOGRAPHY N/A 03/05/2019   Procedure: LEFT HEART CATH AND CORS/GRAFTS ANGIOGRAPHY;  Surgeon: Leonie Man, MD;  Location: [facility];  Service: Cardiovascular;  Laterality: N/A;  . NASAL SINUS SURGERY  ~ 2009   "exposed to mildew; had to have the infectoin drained; cut the outside of my face"  . OVARIAN CYST SURGERY  X 6  . RIGHT/LEFT HEART CATH AND CORONARY/GRAFT ANGIOGRAPHY N/A 11/24/2018   Procedure: RIGHT/LEFT HEART CATH AND CORONARY/GRAFT ANGIOGRAPHY;  Surgeon: Leonie Man, MD;  Location: [facility];  Service: Cardiovascular;  Laterality: N/A;  . TEE WITHOUT CARDIOVERSION N/A 03/26/2017   Procedure: TRANSESOPHAGEAL ECHOCARDIOGRAM (TEE);  Surgeon: Prescott Gum, [name], MD;  Location: [facility];  Service: Open Heart Surgery;  Laterality: N/A;  . UPPER GASTROINTESTINAL ENDOSCOPY       OB History   No obstetric history on file.     Family History  Problem Relation Age of Onset  . Hypertension Father   . Alcohol abuse Father   . Heart attack Father   . Diabetes Mellitus II Father   . Suicidality  Mother   . Breast cancer Sister   . Colon cancer Paternal Grandfather   . Colon polyps Paternal Grandfather   . Esophageal cancer Neg Hx   . Rectal cancer Neg Hx   . Stomach cancer Neg Hx     Social History   Tobacco Use  . Smoking status: Never Smoker  . Smokeless tobacco: Never Used  Vaping Use  . Vaping Use: Never used  Substance Use Topics  . Alcohol use: Not Currently    Comment: 11/05/2016 "maybe 1 glass of wine/month"  . Drug use: No    Home Medications Prior to Admission medications   Medication Sig Start Date End Date Taking? Authorizing Provider  acetaminophen (TYLENOL) 325 MG tablet Take 325 mg by mouth every 6 (six) hours as needed for moderate pain or headache.     [provider]  albuterol (VENTOLIN HFA) 108 (90 Base) MCG/ACT inhaler Inhale 2 puffs into the lungs every 6 (six) hours as needed for wheezing  or shortness of breath.    [provider]  aspirin EC 81 MG tablet Take 81 mg by mouth daily.    [provider]  atorvastatin (LIPITOR) 80 MG tablet Take 1 tablet (80 mg total) by mouth daily at 6 PM. 11/27/18   Isaiah Serge, NP  busPIRone (BUSPAR) 15 MG tablet Take 15 mg by mouth 3 (three) times daily.     [provider]  furosemide (LASIX) 40 MG tablet Take 1 tablet (40 mg total) by mouth 3 (three) times a week. 12/14/19 03/13/20  Richardo Priest, MD  lubiprostone (AMITIZA) 24 MCG capsule Take 1 capsule (24 mcg total) by mouth 2 (two) times daily with a meal. 03/03/20   Thornton Park, MD  magnesium oxide (MAG-OX) 400 (241.3 Mg) MG tablet Take 1 tablet by mouth daily. 01/19/20   [provider]  metoprolol tartrate (LOPRESSOR) 25 MG tablet Take 1 tablet (25 mg total) by mouth 2 (two) times daily. 03/19/19 12/14/19  Richardo Priest, MD  Multiple Vitamin (MULTIVITAMIN WITH MINERALS) TABS tablet Take 1 tablet by mouth daily.    [provider]  nitroGLYCERIN (NITROSTAT) 0.4 MG SL tablet Place 0.4 mg under the tongue every 5 (five) minutes as needed for chest pain.    [provider]  ondansetron (ZOFRAN) 4 MG tablet Take 4 mg by mouth daily as needed for nausea. 04/09/19   [provider]  pantoprazole (PROTONIX) 40 MG tablet Take 40 mg by mouth daily. 02/27/19   [provider]  potassium chloride SA (KLOR-CON) 20 MEQ tablet Take 20 mEq by mouth daily as needed. 11/16/19   [provider]  ranolazine (RANEXA) 1000 MG SR tablet Take 1 tablet (1,000 mg total) by mouth 2 (two) times daily. 03/14/20   Richardo Priest, MD  rOPINIRole (REQUIP) 0.5 MG tablet Take 0.5 mg by mouth daily.    [provider]  sertraline (ZOLOFT) 50 MG tablet Take 50 mg by mouth daily. 11/16/19   [provider]  traZODone (DESYREL) 150 MG tablet Take 300 mg by mouth at bedtime.  09/22/18   [provider]    Allergies     Amiodarone, Colchicine, Compazine [prochlorperazine edisylate], Other, and Prochlorperazine  Review of Systems   Review of Systems  Constitutional: Positive for fatigue. Negative for chills and fever.  Eyes: Negative for photophobia and visual disturbance.  Respiratory: Positive for shortness of breath. Negative for cough.   Cardiovascular: Negative for chest pain and palpitations.  Gastrointestinal: Positive for abdominal pain, blood in stool, nausea and vomiting.  Musculoskeletal: Negative for arthralgias and myalgias.  Skin: Negative for color change and rash.  Neurological: Positive for light-headedness. Negative for syncope.  All other systems reviewed and are negative.   Physical Exam Updated Vital Signs BP (!) 98/45 (BP Location: Right Arm)   Pulse 67   Temp 98.2 F (36.8 C) (Oral)   Resp 15   SpO2 99%   Physical Exam Constitutional:      General: She is not in acute distress.    Comments: Small, thin  HENT:     Head: Normocephalic and atraumatic.  Eyes:     Conjunctiva/sclera: Conjunctivae normal.     Pupils: Pupils are equal, round, and reactive to light.     Comments: Pale conjunctiva  Cardiovascular:     Rate and Rhythm: Normal rate and regular rhythm.  Pulmonary:     Effort: Pulmonary effort is normal. No respiratory distress.  Abdominal:     General: There is no distension.     Tenderness: There is abdominal tenderness in the epigastric area.  Skin:    General: Skin is warm and dry.  Neurological:     General: No focal deficit present.     Mental Status: She is alert. Mental status is at baseline.  Psychiatric:        Mood and Affect: Mood normal.        Behavior: Behavior normal.     ED Results / Procedures / Treatments   Labs (all labs ordered are listed, but only abnormal results are displayed) Labs Reviewed  CBG MONITORING, ED - Abnormal; Notable for the following components:      Result Value   Glucose-Capillary 122 (*)    All other  components within normal limits  COMPREHENSIVE METABOLIC PANEL  CBC  PROTIME-INR  TYPE AND SCREEN    EKG EKG Interpretation  Date/Time:  Friday March 25 2020 11:14:29 EST Ventricular Rate:  70 PR Interval:  150 QRS Duration: 144 QT Interval:  558 QTC Calculation: 602 R Axis:   145 Text Interpretation: Normal sinus rhythm Right axis deviation Left ventricular hypertrophy with QRS widening and repolarization abnormality ( Cornell product , Romhilt-Estes ) Abnormal ECG No sig change from prior ecg Confirmed by Octaviano Glow [phone]) on 03/25/2020 11:31:41 AM   Radiology No results found.  Procedures .Critical Care Performed by: Wyvonnia Dusky, MD Authorized by: Wyvonnia Dusky, MD   Critical care provider statement:    Critical care time (minutes):  45   Critical care was necessary to treat or prevent imminent or life-threatening deterioration of the following conditions:  Circulatory failure   Critical care was time spent personally by me on the following activities:  Discussions with consultants, evaluation of patient's response to treatment, examination of patient, ordering and performing treatments and interventions, ordering and review of laboratory studies, ordering and review of radiographic studies, pulse oximetry, re-evaluation of patient's condition, obtaining history from patient or surrogate and review of old charts   Care discussed with: admitting provider   Comments:     Symptomatic anemia, blood transfusion     Medications Ordered in ED Medications  sodium chloride 0.9 % bolus 1,000 mL (has no administration in time range)  morphine 4 MG/ML injection 4 mg (has no administration in time range)  pantoprazole (PROTONIX) 80 mg in sodium chloride 0.9 % 100 mL IVPB (has no administration in time range)  ondansetron (ZOFRAN) injection 4 mg (has no  administration in time range)    ED Course  I have reviewed the triage vital signs and the nursing  notes.  Pertinent labs & imaging results that were available during my care of the patient were reviewed by me and considered in my medical decision making (see chart for details).  This patient presents to the Emergency Department with complaint of abdominal pain., hematemesis, melena. This involves an extensive number of treatment options, and is a complaint that carries with it a high risk of complications and morbidity.  The differential diagnosis includes GI bleed, gastritis, polyp bleed, and other.  Sx concerning for symptomatic anemia.  BP low but stable with no tachycardia.  No evidence of shock on initial presentation.  I ordered, reviewed, and interpreted labs.  Hgb drop to 6.6 today (most recent check 3 months ago at 10).  BUN elevated to 29.  Mild hypoK.  Findings consistent with acute bleeding. I ordered imaging studies which included acute abd series with chest I independently visualized and interpreted imaging which showed no evidence of free air or perforation and the monitor tracing which showed regular HR Previous records obtained and reviewed showing EGD/Colonoscopy report from yesterday  IV protonix, IV fluids, 2 units pRBC ordered for suspected GI bleed Morphine ordered for epigastric pain  I consulted GI as noted below  Clinical Course as of 03/25/20 1806  Fri Mar 25, 2020  1232 Pt consented for blood transfusion, 2 units ordered.  Lost IV access, i'll attempt USIV.  I spoke to dr Enis Gash from GI whose team will come see her [MT]  1237 GI at bedside - USIV placed.  BP remains stable.  They plan for EGD this afternoon - asked for medical hospitalist admission.   [MT]  [id number] Patient admitted to family medicine team.  CT chest with contrast requested by GI team to evaluate for esophageal perforation.   I have a low clinical suspicion for this, but have ordered the test for them.  Inpatient admission team made aware of scan [MT]    Clinical Course User Index [MT] Broadus Costilla,  Carola Rhine, MD   ED Diagnosis: Symptomatic anemia GI bleed Hypokalemia Lightheadedness  Rx / DC Orders ED Discharge Orders    None       Ocia Simek, Carola Rhine, MD 03/25/20 9747    Wyvonnia Dusky, MD 03/25/20 1810

## 2020-03-25 NOTE — H&P (View-Only) (Signed)
Consultation  Referring Provider: Dr. Langston Masker     Primary Care Physician:  Jaclynn Major, NP Primary Gastroenterologist:   Dr. Tarri Glenn      Reason for Consultation: Anemia, hematemesis            HPI:   Gabrielle Page is a 60 y.o. female with a past medical history of coronary disease, MI, congestive heart failure and recent colonoscopy and endoscopy who presented to the ER today with a complaint of hematemesis and blood in her stool.    Today, the patient explains that since arriving home last night she began to feel very nauseated and starting around 730 had multiple episodes of vomiting, at first with bright red blood, which was continuous, and the last several had dark coffee-ground emesis/"globs of black stuff".  Then had some bowel movements this morning which were just black.  Tells me she tried to drink a couple sips of water but this "came right back up".  Has not had anything else to eat.  Also describes some abdominal pain in her epigastrium as well as bilateral lower quadrants which is rated as a 9-10/10, so bad that she cannot stand up straight when coming into the ER and also some "blurred vision" per the patient.  Tells me she has chronic back pain, so does have some back pain but this has not changed acutely.  Associated symptoms include lightheadedness and weakness.    Denies fever or chills.  ER course: Hemoglobin 6.3 (10.0 on 12/14/2019), WBC 10.9, BUN elevated at 29; unremarkable abdominal x-rays  GI history: 03/24/2020 EGD Dr. Tarri Glenn - Z-line irregular, 34 cm from the incisors. Biopsied. - Normal appearing esophageal lumen. Empiric dilation performed. - Gastritis. Biopsied. - Medium-sized hiatal hernia. - Normal examined duodenum. Biopsied.  03/24/2020 colonoscopy Dr. Tarri Glenn - Preparation of the colon was inadequate. - Hemorrhoids found on perianal exam. - Non-bleeding internal hemorrhoids. - One 3 mm polyp in the transverse colon, removed with a cold  snare. Complete resection. Polyp tissue not retrieved. - Stool in the entire examined colon. - The examination was otherwise normal on direct and retroflexion views.  Past Medical History:  Diagnosis Date  . Abnormal abdominal CT scan 05/14/2019  . Acute ST elevation myocardial infarction (STEMI) of inferolateral wall ([place]) 11/24/2018  . Anemia 05/14/2019  . Anxiety 05/14/2019  . Asthma   . CHF (congestive heart failure) ([place])   . Chronic bronchitis ([place])    "usually get it q yr" (11/05/2016)  . Chronic diastolic heart failure ([place]) 04/17/2017  . Constipation 05/14/2019  . Coronary artery disease   . Coronary artery disease involving native coronary artery of native heart with unstable angina pectoris ([place])   . Coronary artery disease involving native coronary artery with angina pectoris ([place]) 04/08/2017  . Fecal impaction ([place]) 05/14/2019  . GERD (gastroesophageal reflux disease)   . Headache    "weekly" (11/05/2016)  . Heart murmur    "dx'd when I was a child"  . Hepatitis 1991   "when I had my gallbladder out" (11/05/2016)  . History of blood transfusion 1985   S/P childbirth  . History of kidney stones   . History of peptic ulcer 05/14/2019  . HLD (hyperlipidemia) 11/27/2018  . HTN (hypertension) 05/14/2019  . Hypokalemia 04/17/2017  . Iron deficiency anemia 03/07/2019  . Irritable bowel syndrome 05/14/2019  . Kidney stones   . LBBB (left bundle branch block) 11/05/2016  . NSTEMI (non-ST elevated myocardial infarction) ([place]) 11/05/2016   9/18  PCI/DESx1 to p/mLAD  . Orthostatic hypotension   . Pancreatitis 1991  . Pericardial effusion 04/08/2017  . Pneumonia    "twice" (11/05/2016)  . 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 Seizures ([place])   . Severe mitral regurgitation 05/14/2019  . Syncope 05/14/2019  . Therapeutic opioid induced constipation 05/14/2019  . Unstable angina ([place]) 03/04/2019    Past Surgical History:  Procedure Laterality Date  . ABDOMINAL HYSTERECTOMY  1990  . ANKLE FRACTURE SURGERY  2021    left  . APPENDECTOMY  1992  . BILE DUCT EXPLORATION  1991   "reconstruction"  . CESAREAN SECTION  1985  . CHOLECYSTECTOMY OPEN  1991  . COLONOSCOPY    . CORONARY ANGIOPLASTY WITH STENT PLACEMENT  11/05/2016  . CORONARY ARTERY BYPASS GRAFT N/A 03/26/2017   Procedure: CORONARY ARTERY BYPASS GRAFTING (CABG) x 3 WITH ENDOSCOPIC HARVESTING OF RIGHT SAPHENOUS VEIN;  Surgeon: Ivin Poot, MD;  Location: [facility];  Service: Open Heart Surgery;  Laterality: N/A;  . CORONARY STENT INTERVENTION N/A 11/05/2016   Procedure: CORONARY STENT INTERVENTION;  Surgeon: Martinique, Peter M, MD;  Location: [facility];  Service: Cardiovascular;  Laterality: N/A;  . CYSTOSCOPY WITH STENT PLACEMENT  "3 times" (11/05/2016)  . DILATION AND CURETTAGE OF UTERUS  1989  . INTRAVASCULAR ULTRASOUND/IVUS N/A 03/22/2017   Procedure: Intravascular Ultrasound/IVUS;  Surgeon: Martinique, Peter M, MD;  Location: [facility];  Service: Cardiovascular;  Laterality: N/A;  . KNEE ARTHROSCOPY Right 2000s  . LEFT HEART CATH AND CORONARY ANGIOGRAPHY N/A 11/05/2016   Procedure: LEFT HEART CATH AND CORONARY ANGIOGRAPHY;  Surgeon: Martinique, Peter M, MD;  Location: [facility];  Service: Cardiovascular;  Laterality: N/A;  . LEFT HEART CATH AND CORONARY ANGIOGRAPHY N/A 03/22/2017   Procedure: LEFT HEART CATH AND CORONARY ANGIOGRAPHY;  Surgeon: Martinique, Peter M, MD;  Location: [facility];  Service: Cardiovascular;  Laterality: N/A;  . LEFT HEART CATH AND CORS/GRAFTS ANGIOGRAPHY N/A 03/05/2019   Procedure: LEFT HEART CATH AND CORS/GRAFTS ANGIOGRAPHY;  Surgeon: Leonie Man, MD;  Location: [facility];  Service: Cardiovascular;  Laterality: N/A;  . NASAL SINUS SURGERY  ~ 2009   "exposed to mildew; had to have the infectoin drained; cut the outside of my face"  . OVARIAN CYST SURGERY  X 6  . RIGHT/LEFT HEART CATH AND CORONARY/GRAFT ANGIOGRAPHY N/A 11/24/2018   Procedure: RIGHT/LEFT HEART CATH AND CORONARY/GRAFT  ANGIOGRAPHY;  Surgeon: Leonie Man, MD;  Location: [facility];  Service: Cardiovascular;  Laterality: N/A;  . TEE WITHOUT CARDIOVERSION N/A 03/26/2017   Procedure: TRANSESOPHAGEAL ECHOCARDIOGRAM (TEE);  Surgeon: Prescott Gum, [name], MD;  Location: [facility];  Service: Open Heart Surgery;  Laterality: N/A;  . UPPER GASTROINTESTINAL ENDOSCOPY      Family History  Problem Relation Age of Onset  . Hypertension Father   . Alcohol abuse Father   . Heart attack Father   . Diabetes Mellitus II Father   . Suicidality Mother   . Breast cancer Sister   . Colon cancer Paternal Grandfather   . Colon polyps Paternal Grandfather   . Esophageal cancer Neg Hx   . Rectal cancer Neg Hx   . Stomach cancer Neg Hx     Social History   Tobacco Use  . Smoking status: Never Smoker  . Smokeless tobacco: Never Used  Vaping Use  . Vaping Use: Never used  Substance Use Topics  . Alcohol use: Not Currently  Comment: 11/05/2016 "maybe 1 glass of wine/month"  . Drug use: No    Prior to Admission medications   Medication Sig Start Date End Date Taking? Authorizing Provider  acetaminophen (TYLENOL) 325 MG tablet Take 325 mg by mouth every 6 (six) hours as needed for moderate pain or headache.     [provider]  albuterol (VENTOLIN HFA) 108 (90 Base) MCG/ACT inhaler Inhale 2 puffs into the lungs every 6 (six) hours as needed for wheezing or shortness of breath.    [provider]  aspirin EC 81 MG tablet Take 81 mg by mouth daily.    [provider]  atorvastatin (LIPITOR) 80 MG tablet Take 1 tablet (80 mg total) by mouth daily at 6 PM. 11/27/18   Isaiah Serge, NP  busPIRone (BUSPAR) 15 MG tablet Take 15 mg by mouth 3 (three) times daily.     [provider]  furosemide (LASIX) 40 MG tablet Take 1 tablet (40 mg total) by mouth 3 (three) times a week. 12/14/19 03/13/20  Richardo Priest, MD  lubiprostone (AMITIZA) 24 MCG capsule Take 1 capsule (24 mcg total) by mouth  2 (two) times daily with a meal. 03/03/20   Thornton Park, MD  magnesium oxide (MAG-OX) 400 (241.3 Mg) MG tablet Take 1 tablet by mouth daily. 01/19/20   [provider]  metoprolol tartrate (LOPRESSOR) 25 MG tablet Take 1 tablet (25 mg total) by mouth 2 (two) times daily. 03/19/19 12/14/19  Richardo Priest, MD  Multiple Vitamin (MULTIVITAMIN WITH MINERALS) TABS tablet Take 1 tablet by mouth daily.    [provider]  nitroGLYCERIN (NITROSTAT) 0.4 MG SL tablet Place 0.4 mg under the tongue every 5 (five) minutes as needed for chest pain.    [provider]  ondansetron (ZOFRAN) 4 MG tablet Take 4 mg by mouth daily as needed for nausea. 04/09/19   [provider]  pantoprazole (PROTONIX) 40 MG tablet Take 40 mg by mouth daily. 02/27/19   [provider]  potassium chloride SA (KLOR-CON) 20 MEQ tablet Take 20 mEq by mouth daily as needed. 11/16/19   [provider]  ranolazine (RANEXA) 1000 MG SR tablet Take 1 tablet (1,000 mg total) by mouth 2 (two) times daily. 03/14/20   Richardo Priest, MD  rOPINIRole (REQUIP) 0.5 MG tablet Take 0.5 mg by mouth daily.    [provider]  sertraline (ZOLOFT) 50 MG tablet Take 50 mg by mouth daily. 11/16/19   [provider]  traZODone (DESYREL) 150 MG tablet Take 300 mg by mouth at bedtime.  09/22/18   [provider]    Current Facility-Administered Medications  Medication Dose Route Frequency Provider Last Rate Last Admin  . 0.9 %  sodium chloride infusion  10 mL/hr Intravenous Once Wyvonnia Dusky, MD      . pantoprazole (PROTONIX) 80 mg in sodium chloride 0.9 % 100 mL (0.8 mg/mL) infusion  8 mg/hr Intravenous Continuous Trifan, Carola Rhine, MD      . pantoprazole (PROTONIX) 80 mg in sodium chloride 0.9 % 100 mL IVPB  80 mg Intravenous Once Wyvonnia Dusky, MD      . Derrill Memo ON 03/29/2020] pantoprazole (PROTONIX) injection 40 mg  40 mg Intravenous Q12H Wyvonnia Dusky, MD        Current Outpatient Medications  Medication Sig Dispense Refill  . acetaminophen (TYLENOL) 325 MG tablet Take 325 mg by mouth every 6 (six) hours as needed for moderate pain or headache.     Marland Kitchen  albuterol (VENTOLIN HFA) 108 (90 Base) MCG/ACT inhaler Inhale 2 puffs into the lungs every 6 (six) hours as needed for wheezing or shortness of breath.    Marland Kitchen aspirin EC 81 MG tablet Take 81 mg by mouth daily.    Marland Kitchen atorvastatin (LIPITOR) 80 MG tablet Take 1 tablet (80 mg total) by mouth daily at 6 PM. 30 tablet 6  . busPIRone (BUSPAR) 15 MG tablet Take 15 mg by mouth 3 (three) times daily.     . furosemide (LASIX) 40 MG tablet Take 1 tablet (40 mg total) by mouth 3 (three) times a week. 45 tablet 3  . lubiprostone (AMITIZA) 24 MCG capsule Take 1 capsule (24 mcg total) by mouth 2 (two) times daily with a meal. 60 capsule 3  . magnesium oxide (MAG-OX) 400 (241.3 Mg) MG tablet Take 1 tablet by mouth daily.    . metoprolol tartrate (LOPRESSOR) 25 MG tablet Take 1 tablet (25 mg total) by mouth 2 (two) times daily. 180 tablet 3  . Multiple Vitamin (MULTIVITAMIN WITH MINERALS) TABS tablet Take 1 tablet by mouth daily.    . nitroGLYCERIN (NITROSTAT) 0.4 MG SL tablet Place 0.4 mg under the tongue every 5 (five) minutes as needed for chest pain.    Marland Kitchen ondansetron (ZOFRAN) 4 MG tablet Take 4 mg by mouth daily as needed for nausea.    . pantoprazole (PROTONIX) 40 MG tablet Take 40 mg by mouth daily.    . potassium chloride SA (KLOR-CON) 20 MEQ tablet Take 20 mEq by mouth daily as needed.    . ranolazine (RANEXA) 1000 MG SR tablet Take 1 tablet (1,000 mg total) by mouth 2 (two) times daily. 180 tablet 2  . rOPINIRole (REQUIP) 0.5 MG tablet Take 0.5 mg by mouth daily.    . sertraline (ZOLOFT) 50 MG tablet Take 50 mg by mouth daily.    . traZODone (DESYREL) 150 MG tablet Take 300 mg by mouth at bedtime.       Allergies as of 03/25/2020 - Review Complete 03/25/2020  Allergen Reaction Noted  . Amiodarone Diarrhea and  Nausea And Vomiting 04/17/2017  . Colchicine Diarrhea and Nausea And Vomiting 04/17/2017  . Compazine [prochlorperazine edisylate] Other (See Comments) 11/05/2016  . Other Other (See Comments) and Cough 11/05/2016  . Prochlorperazine Other (See Comments) 04/09/2019     Review of Systems:    Constitutional: No weight loss, fever or chills Skin: No rash  Cardiovascular: No chest pain Respiratory: No SOB  Gastrointestinal: See HPI and otherwise negative Genitourinary: No dysuria  Neurological: +dizziness Musculoskeletal: No new muscle or joint pain Hematologic: No bruising Psychiatric: No history of depression or anxiety    Physical Exam:  Vital signs in last 24 hours: Temp:  [97.9 F (36.6 C)-98.2 F (36.8 C)] 98.2 F (36.8 C) (02/11 1145) Pulse Rate:  [50-78] 68 (02/11 1200) Resp:  [14-26] 23 (02/11 1200) BP: (79-171)/(42-101) 90/54 (02/11 1200) SpO2:  [95 %-100 %] 100 % (02/11 1200) Weight:  [55.8 kg] 55.8 kg (02/10 1410)   General:   Caucasian female appears to be in mild distress, Well developed, Well nourished, alert and cooperative Head:  Normocephalic and atraumatic. Eyes:   PEERL, EOMI. No icterus. Conjunctiva pink. Ears:  Normal auditory acuity. Neck:  Supple Throat: Oral cavity and pharynx without inflammation, swelling or lesion.  Lungs: Respirations even and unlabored. Lungs clear to auscultation bilaterally.   No wheezes, crackles, or rhonchi.  Heart: Normal S1, S2. No MRG. Regular rate and rhythm. No peripheral  edema, cyanosis or pallor.  Abdomen:  Soft, nondistended, marked ttp in epigastrum and b/l lower quardants. Some involuntary guarding. Normal bowel sounds. No appreciable masses or hepatomegaly. Rectal:  Not performed.  Msk:  Symmetrical without gross deformities. Peripheral pulses intact.  Extremities:  Without edema, no deformity or joint abnormality.  Neurologic:  Alert and  oriented x4;  grossly normal neurologically.  Skin:   Dry and intact without  significant lesions or rashes. Psychiatric: Demonstrates good judgement and reason without abnormal affect or behaviors.   LAB RESULTS: Recent Labs    03/25/20 1150  WBC 10.9*  HGB 6.3*  HCT 20.4*  PLT 391   BMET Recent Labs    03/25/20 1150  NA 142  K 2.9*  CL 112*  CO2 16*  GLUCOSE 129*  BUN 29*  CREATININE 0.82  CALCIUM 8.8*   LFT Recent Labs    03/25/20 1150  PROT 5.4*  ALBUMIN 3.5  AST 18  ALT 13  ALKPHOS 65  BILITOT 0.5    Impression / Plan:   Impression: 1.  Acute blood loss anemia: Hemoglobin 6.3 from 10 on 12/14/2019, waiting to start her first unit of 2 units ordered of PRBCs, suspect this is from below 2.  Melena/hematemesis: Started yesterday around 730 after EGD and colonoscopy in clinic, see HPI for those full reports; uncertain etiology 3.  Hypokalemia: Potassium currently 2.9 4.  Abdominal pain: Epigastric and bilateral lower pain, due to severity of pain will rule out perforation  Plan: 1.  Agree with PRBC transfusion and continual monitoring of hemoglobin transfusion as needed less than 7 2.  Plans are for EGD this afternoon around 4 PM 3.  Ordered a stat CT of the chest, abdomen and pelvis with IV contrast only to consider perforation 4.  Ordered 10 mEq IV potassium x2, goal is to get her potassium above 3 which is ideal for procedure later this afternoon 5.  Agree with PPI infusion for now 6.  Please await further recommendations from Dr. Havery Moros later today.  Thank you for your kind consultation, we will continue to follow.  Lavone Nian [facility]  03/25/2020, 12:37 PM

## 2020-03-25 NOTE — ED Notes (Signed)
CT staff to room to transport pt to CT, Informed that pt will be going straight for procedure after scan and not returning back to ED room.

## 2020-03-25 NOTE — Telephone Encounter (Signed)
Thank you for the update. Do you know which ER?

## 2020-03-26 DIAGNOSIS — F32A Depression, unspecified: Secondary | ICD-10-CM | POA: Diagnosis present

## 2020-03-26 DIAGNOSIS — K2211 Ulcer of esophagus with bleeding: Secondary | ICD-10-CM | POA: Diagnosis present

## 2020-03-26 DIAGNOSIS — D62 Acute posthemorrhagic anemia: Secondary | ICD-10-CM | POA: Diagnosis present

## 2020-03-26 DIAGNOSIS — I110 Hypertensive heart disease with heart failure: Secondary | ICD-10-CM | POA: Diagnosis present

## 2020-03-26 DIAGNOSIS — Z8249 Family history of ischemic heart disease and other diseases of the circulatory system: Secondary | ICD-10-CM | POA: Diagnosis not present

## 2020-03-26 DIAGNOSIS — F419 Anxiety disorder, unspecified: Secondary | ICD-10-CM | POA: Diagnosis present

## 2020-03-26 DIAGNOSIS — K91840 Postprocedural hemorrhage and hematoma of a digestive system organ or structure following a digestive system procedure: Secondary | ICD-10-CM | POA: Diagnosis present

## 2020-03-26 DIAGNOSIS — K219 Gastro-esophageal reflux disease without esophagitis: Secondary | ICD-10-CM | POA: Diagnosis present

## 2020-03-26 DIAGNOSIS — I959 Hypotension, unspecified: Secondary | ICD-10-CM | POA: Diagnosis present

## 2020-03-26 DIAGNOSIS — I2510 Atherosclerotic heart disease of native coronary artery without angina pectoris: Secondary | ICD-10-CM | POA: Diagnosis present

## 2020-03-26 DIAGNOSIS — Z7982 Long term (current) use of aspirin: Secondary | ICD-10-CM | POA: Diagnosis not present

## 2020-03-26 DIAGNOSIS — Z79899 Other long term (current) drug therapy: Secondary | ICD-10-CM | POA: Diagnosis not present

## 2020-03-26 DIAGNOSIS — Z951 Presence of aortocoronary bypass graft: Secondary | ICD-10-CM | POA: Diagnosis not present

## 2020-03-26 DIAGNOSIS — K223 Perforation of esophagus: Secondary | ICD-10-CM | POA: Diagnosis present

## 2020-03-26 DIAGNOSIS — E876 Hypokalemia: Secondary | ICD-10-CM | POA: Diagnosis present

## 2020-03-26 DIAGNOSIS — I5032 Chronic diastolic (congestive) heart failure: Secondary | ICD-10-CM | POA: Diagnosis present

## 2020-03-26 DIAGNOSIS — Z20822 Contact with and (suspected) exposure to covid-19: Secondary | ICD-10-CM | POA: Diagnosis present

## 2020-03-26 DIAGNOSIS — K922 Gastrointestinal hemorrhage, unspecified: Secondary | ICD-10-CM | POA: Diagnosis not present

## 2020-03-26 DIAGNOSIS — Z8719 Personal history of other diseases of the digestive system: Secondary | ICD-10-CM | POA: Diagnosis not present

## 2020-03-26 DIAGNOSIS — D500 Iron deficiency anemia secondary to blood loss (chronic): Secondary | ICD-10-CM | POA: Diagnosis not present

## 2020-03-26 DIAGNOSIS — I25119 Atherosclerotic heart disease of native coronary artery with unspecified angina pectoris: Secondary | ICD-10-CM | POA: Diagnosis not present

## 2020-03-26 DIAGNOSIS — K449 Diaphragmatic hernia without obstruction or gangrene: Secondary | ICD-10-CM | POA: Diagnosis present

## 2020-03-26 DIAGNOSIS — Y848 Other medical procedures as the cause of abnormal reaction of the patient, or of later complication, without mention of misadventure at the time of the procedure: Secondary | ICD-10-CM | POA: Diagnosis present

## 2020-03-26 DIAGNOSIS — Z955 Presence of coronary angioplasty implant and graft: Secondary | ICD-10-CM | POA: Diagnosis not present

## 2020-03-26 DIAGNOSIS — Z833 Family history of diabetes mellitus: Secondary | ICD-10-CM | POA: Diagnosis not present

## 2020-03-26 DIAGNOSIS — Z8371 Family history of colonic polyps: Secondary | ICD-10-CM | POA: Diagnosis not present

## 2020-03-26 DIAGNOSIS — Z8711 Personal history of peptic ulcer disease: Secondary | ICD-10-CM | POA: Diagnosis not present

## 2020-03-26 DIAGNOSIS — R1013 Epigastric pain: Secondary | ICD-10-CM | POA: Diagnosis not present

## 2020-03-26 DIAGNOSIS — E785 Hyperlipidemia, unspecified: Secondary | ICD-10-CM | POA: Diagnosis present

## 2020-03-26 DIAGNOSIS — I252 Old myocardial infarction: Secondary | ICD-10-CM | POA: Diagnosis not present

## 2020-03-26 LAB — PROTIME-INR
INR: 1.3 — ABNORMAL HIGH (ref 0.8–1.2)
Prothrombin Time: 15.5 seconds — ABNORMAL HIGH (ref 11.4–15.2)

## 2020-03-26 LAB — BASIC METABOLIC PANEL
Anion gap: 11 (ref 5–15)
Anion gap: 12 (ref 5–15)
Anion gap: 9 (ref 5–15)
BUN: 27 mg/dL — ABNORMAL HIGH (ref 6–20)
BUN: 26 mg/dL — ABNORMAL HIGH (ref 6–20)
BUN: 19 mg/dL (ref 6–20)
CO2: 17 mmol/L — ABNORMAL LOW (ref 22–32)
CO2: 17 mmol/L — ABNORMAL LOW (ref 22–32)
CO2: 17 mmol/L — ABNORMAL LOW (ref 22–32)
Calcium: 8.5 mg/dL — ABNORMAL LOW (ref 8.9–10.3)
Calcium: 8.5 mg/dL — ABNORMAL LOW (ref 8.9–10.3)
Calcium: 8.4 mg/dL — ABNORMAL LOW (ref 8.9–10.3)
Chloride: 114 mmol/L — ABNORMAL HIGH (ref 98–111)
Chloride: 114 mmol/L — ABNORMAL HIGH (ref 98–111)
Chloride: 116 mmol/L — ABNORMAL HIGH (ref 98–111)
Creatinine, Ser: 0.69 mg/dL (ref 0.44–1.00)
Creatinine, Ser: 0.7 mg/dL (ref 0.44–1.00)
Creatinine, Ser: 0.47 mg/dL (ref 0.44–1.00)
GFR, Estimated: >60 mL/min (ref >60)
GFR, Estimated: >60 mL/min (ref >60)
GFR, Estimated: >60 mL/min (ref >60)
Glucose, Bld: 101 mg/dL — ABNORMAL HIGH (ref 70–99)
Glucose, Bld: 113 mg/dL — ABNORMAL HIGH (ref 70–99)
Glucose, Bld: 107 mg/dL — ABNORMAL HIGH (ref 70–99)
Potassium: 2.7 mmol/L — CL (ref 3.5–5.1)
Potassium: 2.5 mmol/L — CL (ref 3.5–5.1)
Potassium: 4.1 mmol/L (ref 3.5–5.1)
Sodium: 142 mmol/L (ref 135–145)
Sodium: 143 mmol/L (ref 135–145)
Sodium: 142 mmol/L (ref 135–145)

## 2020-03-26 LAB — CBC
HCT: 20.9 % — ABNORMAL LOW (ref 36.0–46.0)
Hemoglobin: 7.5 g/dL — ABNORMAL LOW (ref 12.0–15.0)
MCH: 29.2 pg (ref 26.0–34.0)
MCHC: 35.9 g/dL (ref 30.0–36.0)
MCV: 81.3 fL (ref 80.0–100.0)
Platelets: 198 10*3/uL (ref 150–400)
RBC: 2.57 MIL/uL — ABNORMAL LOW (ref 3.87–5.11)
RDW: 17.1 % — ABNORMAL HIGH (ref 11.5–15.5)
WBC: 11.1 10*3/uL — ABNORMAL HIGH (ref 4.0–10.5)
nRBC: 0 % (ref 0.0–0.2)

## 2020-03-26 LAB — PREPARE RBC (CROSSMATCH)

## 2020-03-26 LAB — HIV ANTIBODY (ROUTINE TESTING W REFLEX): HIV Screen 4th Generation wRfx: NONREACTIVE

## 2020-03-26 LAB — ETHANOL: Alcohol, Ethyl (B): <10 mg/dL (ref <10)

## 2020-03-26 LAB — RAPID URINE DRUG SCREEN, HOSP PERFORMED
Amphetamines: NOT DETECTED
Barbiturates: NOT DETECTED
Benzodiazepines: POSITIVE — AB
Cocaine: NOT DETECTED
Opiates: POSITIVE — AB
Tetrahydrocannabinol: NOT DETECTED

## 2020-03-26 LAB — MAGNESIUM: Magnesium: 2 mg/dL (ref 1.7–2.4)

## 2020-03-26 LAB — HEMOGLOBIN AND HEMATOCRIT, BLOOD
HCT: 24.4 % — ABNORMAL LOW (ref 36.0–46.0)
Hemoglobin: 8.3 g/dL — ABNORMAL LOW (ref 12.0–15.0)

## 2020-03-26 MED ORDER — SODIUM CHLORIDE 0.9% IV SOLUTION: Freq: Once | INTRAVENOUS | Status: AC

## 2020-03-26 MED ORDER — POTASSIUM CHLORIDE 20 MEQ PO PACK
40.0000 meq | PACK | Freq: Three times a day (TID) | ORAL | Status: DC
  Administered 2020-03-26 (×2): 40 meq via ORAL
  Filled 2020-03-26 (×2): qty 2

## 2020-03-26 MED ORDER — LORAZEPAM 1 MG PO TABS
1.0000 mg | ORAL_TABLET | Freq: Four times a day (QID) | ORAL | Status: AC
  Administered 2020-03-26 – 2020-03-27 (×4): 1 mg via ORAL
  Filled 2020-03-26 (×4): qty 1

## 2020-03-26 MED ORDER — BUSPIRONE HCL 15 MG PO TABS
7.5000 mg | ORAL_TABLET | Freq: Three times a day (TID) | ORAL | Status: DC
  Administered 2020-03-27 – 2020-03-28 (×4): 7.5 mg via ORAL
  Filled 2020-03-26 (×4): qty 1

## 2020-03-26 MED ORDER — SERTRALINE HCL 50 MG PO TABS
50.0000 mg | ORAL_TABLET | Freq: Every day | ORAL | Status: DC
  Administered 2020-03-27 – 2020-03-28 (×2): 50 mg via ORAL
  Filled 2020-03-26 (×2): qty 1

## 2020-03-26 MED ORDER — MORPHINE SULFATE (PF) 2 MG/ML IV SOLN: 2.0000 mg | INTRAVENOUS | Status: DC | PRN

## 2020-03-26 MED ORDER — FOLIC ACID 1 MG PO TABS
1.0000 mg | ORAL_TABLET | Freq: Every day | ORAL | Status: DC
  Administered 2020-03-26 – 2020-03-28 (×3): 1 mg via ORAL
  Filled 2020-03-26 (×4): qty 1

## 2020-03-26 MED ORDER — POTASSIUM CHLORIDE 10 MEQ/100ML IV SOLN
10.0000 meq | INTRAVENOUS | Status: DC
  Administered 2020-03-26: 10 meq via INTRAVENOUS

## 2020-03-26 MED ORDER — LORAZEPAM 2 MG/ML IJ SOLN
1.0000 mg | INTRAMUSCULAR | Status: DC | PRN
  Administered 2020-03-26: 1 mg via INTRAVENOUS
  Administered 2020-03-27: 4 mg via INTRAVENOUS
  Administered 2020-03-28: 2 mg via INTRAVENOUS
  Filled 2020-03-26: qty 2
  Filled 2020-03-26 (×2): qty 1

## 2020-03-26 MED ORDER — TRAZODONE HCL 100 MG PO TABS
200.0000 mg | ORAL_TABLET | Freq: Every day | ORAL | Status: DC
  Administered 2020-03-26 – 2020-03-27 (×2): 200 mg via ORAL
  Filled 2020-03-26 (×2): qty 2

## 2020-03-26 MED ORDER — HYDROMORPHONE HCL 1 MG/ML IJ SOLN: 0.5000 mg | Freq: Once | INTRAMUSCULAR | Status: DC

## 2020-03-26 MED ORDER — MAGNESIUM SULFATE 4 GM/100ML IV SOLN
4.0000 g | Freq: Once | INTRAVENOUS | Status: AC
  Administered 2020-03-26: 4 g via INTRAVENOUS
  Filled 2020-03-26: qty 100

## 2020-03-26 MED ORDER — ADULT MULTIVITAMIN W/MINERALS CH
1.0000 | ORAL_TABLET | Freq: Every day | ORAL | Status: DC
  Administered 2020-03-26 – 2020-03-28 (×3): 1 via ORAL
  Filled 2020-03-26 (×3): qty 1

## 2020-03-26 MED ORDER — THIAMINE HCL 100 MG/ML IJ SOLN
100.0000 mg | Freq: Every day | INTRAMUSCULAR | Status: DC
  Administered 2020-03-26: 100 mg via INTRAVENOUS
  Filled 2020-03-26 (×2): qty 2

## 2020-03-26 MED ORDER — LORAZEPAM 1 MG PO TABS
1.0000 mg | ORAL_TABLET | ORAL | Status: DC | PRN
Start: 2020-03-26 — End: 2020-03-28

## 2020-03-26 MED ORDER — POTASSIUM CHLORIDE 10 MEQ/100ML IV SOLN
10.0000 meq | INTRAVENOUS | Status: AC
  Administered 2020-03-26 (×2): 10 meq via INTRAVENOUS
  Filled 2020-03-26 (×2): qty 100

## 2020-03-26 MED ORDER — LORAZEPAM 2 MG/ML IJ SOLN: 1.0000 mg | Freq: Four times a day (QID) | INTRAMUSCULAR | Status: AC

## 2020-03-26 MED ORDER — TRAZODONE HCL 100 MG PO TABS
100.0000 mg | ORAL_TABLET | Freq: Every morning | ORAL | Status: DC
  Administered 2020-03-27: 100 mg via ORAL
  Filled 2020-03-26: qty 1

## 2020-03-26 MED ORDER — THIAMINE HCL 100 MG PO TABS
100.0000 mg | ORAL_TABLET | Freq: Every day | ORAL | Status: DC
  Administered 2020-03-27 – 2020-03-28 (×2): 100 mg via ORAL
  Filled 2020-03-26 (×3): qty 1

## 2020-03-26 NOTE — Progress Notes (Signed)
CRITICAL VALUE ALERT  Critical Value:  Potassium 2.5  Date & Time Notied: 03/26/2020 at [facility]  Gabrielle Page Notified: Paged Family med   Orders Received/Actions taken: Pending

## 2020-03-26 NOTE — Progress Notes (Signed)
Called to patient room as nurse stated that patient was stating she was leaving AMA. Went to patient room to speak with patient. She was standing up at the counter next to her belongings looking for her denture adhesive stating that her things keep disappearing.  Patient was extraordinarily upset stating that a doctor from this morning told her that she would be able to go home today and that "they put a clip in me that is causing me to bleed, and he told me he will come back and take it out".  I discussed with the patient that per my consultation with GI, she was not going home today and that she had other medical conditions that were keeping her in the hospital.  Patient was still very agitated and stated that she was going to go home and no one could stop her that her son was coming to pick her up.  She stated that everyone was lying to her and that she just needed to go home.  I detailed to patient the risks of going home including death given patient's recent critically low potassium and critical anemia that required multiple transfusions.  Patient still stated that she wanted to go home and she repeated back to multiple times that she knew she could die if she did.  She was very adamant that she wanted to speak with the GI doctor who told her she could go home.  With her permission, I called her significant other Ed Banks who gave me the history of her recent issues have been causing stress.  Had the significant other on speaker phone talk to the patient to get her to realize the reasons why she needed to stay in the hospital and that she needed to be stable before she could go home or she would end up back in the hospital.  Patient appeared to calm down somewhat and leaned on me for comfort.  Patient was able to be comforted by rubbing her back, getting her water, and getting more blankets to make her comfortable.  Patient then asked for medication to help her calm down.  Patient was given 1 mg Ativan.   Patient now on scheduled 1 mg Ativan every 6 hours and will be reevaluated in the morning to determine if this remains appropriate.  Gabrielle Cavan, DO

## 2020-03-26 NOTE — Progress Notes (Signed)
CRITICAL VALUE ALERT  Critical Value:  Potassium 2.7  Date & Time Notied:  03/26/2020 at [facility]  Provider Notified: Dr Larae Grooms  Orders Received/Actions taken: Will place new orders

## 2020-03-26 NOTE — Hospital Course (Addendum)
Gabrielle Page is a 60 y.o. female presenting with hematemesis and abdominal pain . PMH is significant for  CAD (s/p 3v CABG), MI, CHF, Barrett's esophagus (2019), chronic constipation, GERD, hx of gastric ulcer bleed, IBS.  Acute on chronic anemia  GI bleed Patient was admitted with hematemesis and melena in the setting of recent EGD and colonoscopy.  On admission, patient was found to have a hemoglobin of 6.3 and was transfused blood.  GI was consulted and patient underwent emergent EGD and found to have ulcer with visible vessel in the mid esophagus and hemostasis clip was placed. Patient was transfused a total of 4U pRBC with a Hgb of 10 upon discharge. GI recommendations including soft diet for 10 days, protonix 40 mg bid and GI outpatient follow up in 2-3 weeks. Patient restarted on Plavix and aspirin upon discharge with close follow up scheduled. Patient agreeable to next day PCP follow up as well as nurse visit for  outpatient cardiology to repeat CBC. Called Dr. Joya Gaskins office and spoke to [place] who stated that patient can come in on 2/17 for follow up blood work to determine if plavix and aspirin should be continued. Patient aware and agreeable to this plan upon discharge.  Hypokalemia Patient was admitted with a severely low potassium of 2.9.  Patient was persistently low and repleted as appropriate throughout this hospital stay. Upon discharge, patient's potassium was 3.2 with appropriate repletion given.   Alcohol use Patient denied alcohol use but was found to have an empty beer can in her trash.  Due to concern for alcohol withdrawal, CIWA protocol was initiated and patient noted to have 1 and 0 respectively.  Patient would benefit from counseling regarding frequent alcohol use.   All other issues chronic and stable.   Issues for follow-up: Recommend outpatient monitoring of potassium, repeat BMP. Patient had recurrent hypokalemia.  Repeat CBC, transfusion threshold 8 given  cardiac history. Reassess whether to continue aspirin and plavix dual therapy.  Readdress alcohol use as patient denied but there was an empty beer can found in her hospital garbage can. PCP follow up for depression and anxiety, to reassess and alter medications as appropriate. Patient may benefit from seeing a therapist outpatient.

## 2020-03-26 NOTE — Progress Notes (Signed)
Notified Dr Larae Grooms of patient's iv to [facility] hurting during blood transfusion, attempted to flush with patient c/o pain still. New orders given to stop potassium and protonix drip, and continue blood transfusion to patent IV to RFA. Iv team consulted to place new IV. Notified patient of what was being done. 2355 blood transfusion continued to RFA at this time. Will restart other meds once IV site obtained.

## 2020-03-26 NOTE — Progress Notes (Signed)
Progress Note   Subjective  No blood per rectum or any BMs overnight per nursing. No recurrent vomiting. Significant anxiety overnight. She has not done well with K infusions through the IV. HAs received 2 units RBC   Objective   Vital signs in last 24 hours: Temp:  [97.5 F (36.4 C)-99.7 F (37.6 C)] 99 F (37.2 C) (02/12 0450) Pulse Rate:  [62-92] 90 (02/12 0450) Resp:  [15-29] 21 (02/12 0450) BP: (70-173)/(33-98) 118/98 (02/12 0450) SpO2:  [94 %-100 %] 100 % (02/12 0450) Last BM Date: 03/26/20 General:    white female in NAD Abdomen:  Soft, nondistended, mild tenderness.  Extremities:  Without edema. Psych:  Cooperative. Normal mood and affect.  Intake/Output from previous day: 02/11 0701 - 02/12 0700 In: 766 [I.V.:200; Blood:566] Out: 600 [Urine:600] Intake/Output this shift: No intake/output data recorded.  Lab Results: Recent Labs    03/25/20 1150 03/26/20 0301  WBC 10.9* 11.1*  HGB 6.3* 7.5*  HCT 20.4* 20.9*  PLT 391 198   BMET Recent Labs    03/25/20 2248 03/26/20 0301 03/26/20 0555  NA 143 142 143  K 2.9* 2.7* 2.5*  CL 113* 114* 114*  CO2 17* 17* 17*  GLUCOSE 103* 101* 113*  BUN 29* 27* 26*  CREATININE 0.72 0.69 0.70  CALCIUM 8.7* 8.5* 8.5*   LFT Recent Labs    03/25/20 1150  PROT 5.4*  ALBUMIN 3.5  AST 18  ALT 13  ALKPHOS 65  BILITOT 0.5   PT/INR Recent Labs    03/25/20 1158 03/26/20 0301  LABPROT 15.1 15.5*  INR 1.2 1.3*    Studies/Results: CT CHEST ABDOMEN PELVIS W CONTRAST  Result Date: 03/25/2020 CLINICAL DATA:  History of EGD and colonoscopy yesterday with severe pain and drop in hemoglobin. EXAM: CT CHEST, ABDOMEN, AND PELVIS WITH CONTRAST TECHNIQUE: Multidetector CT imaging of the chest, abdomen and pelvis was performed following the standard protocol during bolus administration of intravenous contrast. CONTRAST:  154mL OMNIPAQUE IOHEXOL 300 MG/ML  SOLN COMPARISON:  01/22/2020 FINDINGS: CT CHEST FINDINGS  Cardiovascular: The heart is upper limits of normal in size and stable. No pericardial effusion. The aorta is normal in caliber. No dissection. Stable surgical changes from coronary artery bypass surgery. Stable age advanced coronary artery calcifications and LAD stent. Mediastinum/Nodes: No mediastinal or hilar mass or adenopathy. No pneumomediastinum. There is mild distal esophageal wall thickening but no evidence for esophageal perforation. Lungs/Pleura: No pneumothorax or pleural effusion or pleural hematoma. A few scattered lower lobe pulmonary nodules are stable. Musculoskeletal: No breast masses, supraclavicular or axillary adenopathy. The bony thorax is intact. No acute findings. The upper sternotomy is ununited. CT ABDOMEN PELVIS FINDINGS Hepatobiliary: Stable cystic area in the left hepatic lobe. Gallbladder is surgically absent. Pneumobilia likely related to previous sphincterotomy. No worrisome hepatic lesions or evidence of acute hepatic injury. No perihepatic fluid collections/hematoma. The portal and hepatic veins are patent. Pancreas: No mass, inflammation or ductal dilatation. Spleen: Normal size.  No focal lesions. Adrenals/Urinary Tract: Adrenal glands are normal. Left renal calculus but no obstructing ureteral calculi or bladder calculi. No worrisome renal or bladder lesions are identified. Stomach/Bowel: The stomach, duodenum, small bowel and colon are unremarkable. No acute inflammatory changes, mass lesions or obstructive findings. Vascular/Lymphatic: The aorta is normal in caliber. No dissection. The branch vessels are patent. The major venous structures are patent. No mesenteric or retroperitoneal mass or adenopathy. Small scattered lymph nodes are noted. Reproductive: Surgically absent. Other: No free air  or free fluid is identified in the abdomen/pelvis. Musculoskeletal: Remote T12 and L2 compression fractures. No acute bony findings. IMPRESSION: 1. No CT findings for esophageal  perforation, pneumomediastinum or retroperitoneal hematoma. No free abdominal/pelvic fluid or air. 2. Mild distal esophageal wall thickening but no mass or intramural gas. 3. Stable surgical changes from coronary artery bypass surgery. 4. Stable age advanced coronary artery calcifications and LAD stent. 5. Status post cholecystectomy and sphincterotomy with pneumobilia. 6. Left renal calculus. 7. Remote T12 and L2 compression fractures. Electronically Signed   By: Marijo Sanes M.D.   On: 03/25/2020 15:33   DG Abdomen Acute W/Chest  Result Date: 03/25/2020 CLINICAL DATA:  Epigastric abdominal pain.  Recent endoscopy. EXAM: DG ABDOMEN ACUTE WITH 1 VIEW CHEST COMPARISON:  CT scan 03/25/2019 FINDINGS: The upright chest x-ray is unremarkable. No infiltrates, effusions or pneumothorax. There are surgical changes from coronary artery bypass surgery noted along with a coronary artery stent. The bony structures are unremarkable. Unremarkable abdominal bowel gas pattern. Moderate scattered air throughout the colon. No findings for free air. The soft tissue shadows are maintained. Pneumobilia is noted and is a chronic finding due to prior cholecystectomy and sphincterotomy. The bony structures are intact. IMPRESSION: 1. No acute cardiopulmonary findings. 2. No plain film findings for an acute abdominal process. Electronically Signed   By: Marijo Sanes M.D.   On: 03/25/2020 12:46       Assessment / Plan:    60 y/o female who underwent routine EGD and colonoscopy on 2/10. EGD was performed with empiric dilation of the esophagus for dysphagia, no mucosal wrents noted, and biopsies taken of the esophagus, GEJ, stomach, duodenum. She came in yesterday with hematemesis, significant drop in Hgb to 6s, abdominal pain. CT scan ruled out perforation, no concerning intra-abdominal process. Urgent EGD done yesterday showed ulcer with visible vessel in mid esophagus I presume from biopsy site, and 1 hemostasis clip placed  across the lesion. The stomach was suprisingly filled with food and old heme, despite multiple episodes of vomiting, poor visualization noted.   Post procedure she has not had any further bleeding symptoms. No BMs, no further hematemesis. Hgb went from 6s yesterday on admission to mid 7s after 2 units PRBCs.  While the exam yesterday was limited due to retained food in the stomach, appearance of findings in the esophagus make the post biopsy ulcer the likely cause of her symptoms. Hemodynamically stable.  Plan: - clear liquid diet okay now, can advance to full liquids later today if otherwise stable - continue IV PPI - can give potassium supplementation PO, would prefer giving this via elixer rather than large pill however if possible - if any symptoms concerning for further bleeding would have low threshold to repeat EGD  - if she continues to do well today can advance to soft diet tomorrow. Consider discharge later tomorrow if she is otherwise stable.   We will continue to follow, please call with questions.  Delaware Cellar, MD [facility]

## 2020-03-26 NOTE — Progress Notes (Signed)
Family Medicine Teaching Service Daily Progress Note Intern Pager: [phone]  Patient name: Gabrielle Page Medical record number: [id number] Date of birth: [birth year] Age: 60 y.o. Gender: female  Primary Care Provider: Jaclynn Major, NP Consultants:  GI Code Status:  Full  Pt Overview and Page Events to Date:  2/10 - EGD and Colonoscopy 2/11 - Admitted with hemoptysis and melena; underwent EGD with clip placement 2/12 - Found beer can trash-can, initiated CIWAs   Assessment and Plan: [name] is a 60 y.o. female presenting with hematemesis and abdominal pain . PMH is significant for  CAD (s/p 3v CABG), MI, CHF, Barrett's esophagus (2019), chronic constipation, GERD, hx of gastric ulcer bleed, IBS.  Acute on chronic anemia On 2/11 patient underwent EGD with hemostasis clip placement at site of an ulcer with visible vessel in mid esophagus.  Patient did have bowel movement this morning with a few notable or clots per nursing.  Hemoglobin trend 6.3 > 2U PRBC > 7.5 > not transfusing 1 unit.  Hemoglobin threshold 8 due to cardiac disease. -GI following, appreciate work with patient and recommendations -Follow-up posttransfusion H&H -Continue to monitor for further signs of bleeding -Advancing diet to clear liquids currently, can advance to soft diet tomorrow if doing well -If bleeding symptoms persist, GI likely to do repeat EGD -Continue to monitor hemoglobin -Transfusion threshold is 8 due to cardiac history -Continue to hold home aspirin   Hypokalemia K trend since admission: 2.9 > 20 mEq K (and 1U PRBC) > 2.9 > 20 mEq K (and 1U PRBC) >  2.7 > 2.5. - Replete K with IV K 42mEq (10 mEq/hr x4), and with oral 40 BID - Follow up BMP in PM - Continue to trend with frequent BMPs  Alcohol use Empty beer can in patient's trash can, no visitors present overnight per nursing.  On admission, patient denied any alcohol use.  Does have an increased amount of anxiety and  overnight received 2 mg Ativan x2, which was initially given due to the anxiety and issues with receiving IV potassium supplementation.  Concern now the patient is at risk for withdrawal during hospitalization stay.  Currently initiating CIWA's and PRN ativan per protocol. Patient has very increased anxiety, unsure if patient is baseline this anxious or if she is starting to have withdraw symptoms (exam done before beer was discovered). -CIWA's with PRN ativan per withdrawal protocol -Follow-up EtOH level  Positive UDS Patient's UDS was positive for opiates and benzodiazepines, this was collected after patient had received medications while in the hospital so is not of further use.  Was collected initially because there was concerns due to perceived abnormalities in behavior.  Hypotension, improving BP in the last 24h ranged from 70-173/28-98. MAPs remained >60. Latest BP of 124/28 has MAP of 60. Has much improved since receiving fluids and transfusions as well as after hemostasis with clip.  - Will continue to monitor  CAD Patient has history of CAD and only currently on ASA per med rec and patient report. Currently holding ASA in setting of acute bleeding and transfusion requirement (transfusion threshold 8). - Currently holding ASA  Chronic conditions CAD - currently holding ASA, metoprolol, nitroglycerin  HLD - holding atorvastatin as NPO CHF - holding furosemide  Depression - holding trazodone, sertraline, buspirone, trazodone IBS/chronic consitpation - holding libiprostone   FEN/GI: Protonix, Clear liquids PPx: SCDs   Status is: Observation  The patient remains OBS appropriate and will d/c before 2 midnights.  Dispo:  Patient From: Home  Planned Disposition: Home  Expected discharge date: 03/29/2020  Medically stable for discharge: No       Subjective:  Patient reports that she is in a significant amount of pain that is worsened since the GI procedure.  She reports that  the pain when pressing on her stomach is gotten a little bit better but that she is depressed her pain has worsened.  She is extraordinarily anxious in the room and states that the morphine does not touch her pain, she would like Dilaudid.  She complains of extreme thirst that is making it difficult for her to focus or breathe.  Objective: Temp:  [97.5 F (36.4 C)-99.7 F (37.6 C)] 98.8 F (37.1 C) (02/12 1055) Pulse Rate:  [62-92] 77 (02/12 1040) Resp:  [15-29] 18 (02/12 1055) BP: (70-173)/(28-98) 100/69 (02/12 1055) SpO2:  [94 %-100 %] 97 % (02/12 1015) Physical Exam: General: Anxious, standing up in room, just had a bowel movement Cardiovascular: RRR, systolic murmur appreciated, 2+ peripheral pulses Respiratory: Tachypnea (appears from anxiety, not true oxygen need), CTAB Abdomen: soft, non-distended, minimal tenderness to palpation Extremities: moving all extremities appropriately and equally Psych: patient appears very anxious in the room with associated tachypnea  Laboratory: Recent Labs  Lab 03/25/20 1150 03/26/20 0301  WBC 10.9* 11.1*  HGB 6.3* 7.5*  HCT 20.4* 20.9*  PLT 391 198   Recent Labs  Lab 03/25/20 1150 03/25/20 2248 03/26/20 0301 03/26/20 0555  NA 142 143 142 143  K 2.9* 2.9* 2.7* 2.5*  CL 112* 113* 114* 114*  CO2 16* 17* 17* 17*  BUN 29* 29* 27* 26*  CREATININE 0.82 0.72 0.69 0.70  CALCIUM 8.8* 8.7* 8.5* 8.5*  PROT 5.4*  --   --   --   BILITOT 0.5  --   --   --   ALKPHOS 65  --   --   --   ALT 13  --   --   --   AST 18  --   --   --   GLUCOSE 129* 103* 101* 113*     Imaging/Diagnostic Tests: CT CHEST ABDOMEN PELVIS W CONTRAST  Result Date: 03/25/2020 CLINICAL DATA:  History of EGD and colonoscopy yesterday with severe pain and drop in hemoglobin. EXAM: CT CHEST, ABDOMEN, AND PELVIS WITH CONTRAST TECHNIQUE: Multidetector CT imaging of the chest, abdomen and pelvis was performed following the standard protocol during bolus administration of  intravenous contrast. CONTRAST:  164mL OMNIPAQUE IOHEXOL 300 MG/ML  SOLN COMPARISON:  01/22/2020 FINDINGS: CT CHEST FINDINGS Cardiovascular: The heart is upper limits of normal in size and stable. No pericardial effusion. The aorta is normal in caliber. No dissection. Stable surgical changes from coronary artery bypass surgery. Stable age advanced coronary artery calcifications and LAD stent. Mediastinum/Nodes: No mediastinal or hilar mass or adenopathy. No pneumomediastinum. There is mild distal esophageal wall thickening but no evidence for esophageal perforation. Lungs/Pleura: No pneumothorax or pleural effusion or pleural hematoma. A few scattered lower lobe pulmonary nodules are stable. Musculoskeletal: No breast masses, supraclavicular or axillary adenopathy. The bony thorax is intact. No acute findings. The upper sternotomy is ununited. CT ABDOMEN PELVIS FINDINGS Hepatobiliary: Stable cystic area in the left hepatic lobe. Gallbladder is surgically absent. Pneumobilia likely related to previous sphincterotomy. No worrisome hepatic lesions or evidence of acute hepatic injury. No perihepatic fluid collections/hematoma. The portal and hepatic veins are patent. Pancreas: No mass, inflammation or ductal dilatation. Spleen: Normal size.  No focal lesions. Adrenals/Urinary Tract: Adrenal glands are normal. Left  renal calculus but no obstructing ureteral calculi or bladder calculi. No worrisome renal or bladder lesions are identified. Stomach/Bowel: The stomach, duodenum, small bowel and colon are unremarkable. No acute inflammatory changes, mass lesions or obstructive findings. Vascular/Lymphatic: The aorta is normal in caliber. No dissection. The branch vessels are patent. The Page venous structures are patent. No mesenteric or retroperitoneal mass or adenopathy. Small scattered lymph nodes are noted. Reproductive: Surgically absent. Other: No free air or free fluid is identified in the abdomen/pelvis.  Musculoskeletal: Remote T12 and L2 compression fractures. No acute bony findings. IMPRESSION: 1. No CT findings for esophageal perforation, pneumomediastinum or retroperitoneal hematoma. No free abdominal/pelvic fluid or air. 2. Mild distal esophageal wall thickening but no mass or intramural gas. 3. Stable surgical changes from coronary artery bypass surgery. 4. Stable age advanced coronary artery calcifications and LAD stent. 5. Status post cholecystectomy and sphincterotomy with pneumobilia. 6. Left renal calculus. 7. Remote T12 and L2 compression fractures. Electronically Signed   By: Marijo Sanes M.D.   On: 03/25/2020 15:33   DG Abdomen Acute W/Chest  Result Date: 03/25/2020 CLINICAL DATA:  Epigastric abdominal pain.  Recent endoscopy. EXAM: DG ABDOMEN ACUTE WITH 1 VIEW CHEST COMPARISON:  CT scan 03/25/2019 FINDINGS: The upright chest x-ray is unremarkable. No infiltrates, effusions or pneumothorax. There are surgical changes from coronary artery bypass surgery noted along with a coronary artery stent. The bony structures are unremarkable. Unremarkable abdominal bowel gas pattern. Moderate scattered air throughout the colon. No findings for free air. The soft tissue shadows are maintained. Pneumobilia is noted and is a chronic finding due to prior cholecystectomy and sphincterotomy. The bony structures are intact. IMPRESSION: 1. No acute cardiopulmonary findings. 2. No plain film findings for an acute abdominal process. Electronically Signed   By: Marijo Sanes M.D.   On: 03/25/2020 12:46     Rise Patience, DO 03/26/2020, 12:31 PM PGY-1, [place] Intern pager: [phone], text pages welcome

## 2020-03-26 NOTE — Anesthesia Postprocedure Evaluation (Signed)
Anesthesia Post Note  Patient: Marayah Higdon  Procedure(s) Performed: ESOPHAGOGASTRODUODENOSCOPY (EGD) WITH PROPOFOL (N/A ) HEMOSTASIS CLIP PLACEMENT     Patient location during evaluation: PACU Anesthesia Type: MAC Level of consciousness: awake and alert Pain management: pain level controlled Vital Signs Assessment: post-procedure vital signs reviewed and stable Respiratory status: spontaneous breathing, nonlabored ventilation, respiratory function stable and patient connected to nasal cannula oxygen Cardiovascular status: stable and blood pressure returned to baseline Postop Assessment: no apparent nausea or vomiting Anesthetic complications: no   No complications documented.  Last Vitals:  Vitals:   03/26/20 0450 03/26/20 0947  BP: (!) 118/98 (!) 124/28  Pulse: 90 72  Resp: (!) 21 20  Temp: 37.2 C 37.2 C  SpO2: 100% 100%    Last Pain:  Vitals:   03/26/20 0947  TempSrc: Oral  PainSc:                  Kealey Kemmer

## 2020-03-26 NOTE — Progress Notes (Signed)
1 PRBC infused and completed at 1340. No adverse reactions. Sitting up at bedside.H&H ordered

## 2020-03-27 ENCOUNTER — Encounter (HOSPITAL_COMMUNITY): Payer: Self-pay | Admitting: Gastroenterology

## 2020-03-27 DIAGNOSIS — K2211 Ulcer of esophagus with bleeding: Secondary | ICD-10-CM

## 2020-03-27 DIAGNOSIS — K922 Gastrointestinal hemorrhage, unspecified: Secondary | ICD-10-CM | POA: Diagnosis not present

## 2020-03-27 DIAGNOSIS — R1013 Epigastric pain: Secondary | ICD-10-CM | POA: Diagnosis not present

## 2020-03-27 DIAGNOSIS — K223 Perforation of esophagus: Secondary | ICD-10-CM | POA: Diagnosis not present

## 2020-03-27 DIAGNOSIS — D500 Iron deficiency anemia secondary to blood loss (chronic): Secondary | ICD-10-CM

## 2020-03-27 LAB — COMPREHENSIVE METABOLIC PANEL WITH GFR
ALT: 11 U/L (ref 0–44)
AST: 16 U/L (ref 15–41)
Albumin: 2.6 g/dL — ABNORMAL LOW (ref 3.5–5.0)
Alkaline Phosphatase: 51 U/L (ref 38–126)
Anion gap: 8 (ref 5–15)
BUN: 12 mg/dL (ref 6–20)
CO2: 16 mmol/L — ABNORMAL LOW (ref 22–32)
Calcium: 8.2 mg/dL — ABNORMAL LOW (ref 8.9–10.3)
Chloride: 115 mmol/L — ABNORMAL HIGH (ref 98–111)
Creatinine, Ser: 0.62 mg/dL (ref 0.44–1.00)
GFR, Estimated: >60 mL/min
Glucose, Bld: 106 mg/dL — ABNORMAL HIGH (ref 70–99)
Potassium: 3.4 mmol/L — ABNORMAL LOW (ref 3.5–5.1)
Sodium: 139 mmol/L (ref 135–145)
Total Bilirubin: 0.9 mg/dL (ref 0.3–1.2)
Total Protein: 4.6 g/dL — ABNORMAL LOW (ref 6.5–8.1)

## 2020-03-27 LAB — PREPARE RBC (CROSSMATCH)

## 2020-03-27 LAB — HEMOGLOBIN: Hemoglobin: 7.7 g/dL — ABNORMAL LOW (ref 12.0–15.0)

## 2020-03-27 MED ORDER — SODIUM CHLORIDE 0.9% IV SOLUTION: Freq: Once | INTRAVENOUS | Status: AC

## 2020-03-27 MED ORDER — PANTOPRAZOLE SODIUM 40 MG PO TBEC
40.0000 mg | DELAYED_RELEASE_TABLET | Freq: Two times a day (BID) | ORAL | Status: DC
  Administered 2020-03-27 – 2020-03-28 (×3): 40 mg via ORAL
  Filled 2020-03-27 (×3): qty 1

## 2020-03-27 MED ORDER — POTASSIUM CHLORIDE 20 MEQ PO PACK
20.0000 meq | PACK | Freq: Two times a day (BID) | ORAL | Status: AC
  Administered 2020-03-27: 20 meq via ORAL
  Filled 2020-03-27: qty 1

## 2020-03-27 NOTE — Progress Notes (Signed)
[facility] Progress Note  CC:  I want to go home  Assessment / Plan: GI bleed from esophageal biopsy site following EGD 03/24/20 GI blood loss anemia requiring 2 units pRBCs Anxiety/Agitation Hypokalemia, resolved  No additional overt bleeding. Remains hemodynamically stable.  Awaiting a hemoglobin result this morning, but, anemia appeared to be stabilizing after her transfusion with a hemoglobin of 8.3 yesterday afternoon. BUN has normalized.   Plan: - Follow-up hemoglobin pending this morning - Advance to soft diet today - Switch IV PPI to po PPI BID on discharge - Discharge to home later today if hemoglobin remains stable and no new clinical issues/concerns develop - Recommend repeat hemoglobin later this week - I will arrange outpatient follow-up with [facility] following discharge  Dr. Havery Moros is available today for any additional questions or concerns.   Subjective: Last BM yesterday which she described as "clear." Denies melena or hematochezia. Reports dry mouth despite clear liquid diet. No dysphagia or odynophagia. Requesting discharge. GI ROS is otherwise negative. No family present at the time of my evaluation.   Objective:  Vital signs in last 24 hours: Temp:  [97.6 F (36.4 C)-99.6 F (37.6 C)] 99.6 F (37.6 C) (02/13 0552) Pulse Rate:  [68-88] 85 (02/13 0634) Resp:  [16-20] 18 (02/13 0552) BP: (92-131)/(28-69) 131/61 (02/13 0634) SpO2:  [96 %-100 %] 96 % (02/13 0552) Last BM Date: 03/26/20 General:   Alert, in NAD Heart:  Regular rate and rhythm; no murmurs Pulm: Clear anteriorly; no wheezing Abdomen:  Soft. Nontender. Nondistended. Normal bowel sounds. No rebound or guarding. LAD: No inguinal or umbilical LAD Extremities:  Without edema. Neurologic:  Alert and  oriented x4;  grossly normal neurologically. Psych:  Alert and cooperative. Normal mood and affect.  Intake/Output from previous day:  Lab Results: Recent Labs    03/25/20 1150  03/26/20 0301 03/26/20 1601  WBC 10.9* 11.1*  --   HGB 6.3* 7.5* 8.3*  HCT 20.4* 20.9* 24.4*  PLT 391 198  --    BMET Recent Labs    03/26/20 0301 03/26/20 0555 03/26/20 1601  NA 142 143 142  K 2.7* 2.5* 4.1  CL 114* 114* 116*  CO2 17* 17* 17*  GLUCOSE 101* 113* 107*  BUN 27* 26* 19  CREATININE 0.69 0.70 0.47  CALCIUM 8.5* 8.5* 8.4*   LFT Recent Labs    03/25/20 1150  PROT 5.4*  ALBUMIN 3.5  AST 18  ALT 13  ALKPHOS 65  BILITOT 0.5   PT/INR Recent Labs    03/25/20 1158 03/26/20 0301  LABPROT 15.1 15.5*  INR 1.2 1.3*   Hepatitis Panel No results for input(s): HEPBSAG, HCVAB, HEPAIGM, HEPBIGM in the last 72 hours.  CT CHEST ABDOMEN PELVIS W CONTRAST  Result Date: 03/25/2020 CLINICAL DATA:  History of EGD and colonoscopy yesterday with severe pain and drop in hemoglobin. EXAM: CT CHEST, ABDOMEN, AND PELVIS WITH CONTRAST TECHNIQUE: Multidetector CT imaging of the chest, abdomen and pelvis was performed following the standard protocol during bolus administration of intravenous contrast. CONTRAST:  193mL OMNIPAQUE IOHEXOL 300 MG/ML  SOLN COMPARISON:  01/22/2020 FINDINGS: CT CHEST FINDINGS Cardiovascular: The heart is upper limits of normal in size and stable. No pericardial effusion. The aorta is normal in caliber. No dissection. Stable surgical changes from coronary artery bypass surgery. Stable age advanced coronary artery calcifications and LAD stent. Mediastinum/Nodes: No mediastinal or hilar mass or adenopathy. No pneumomediastinum. There is mild distal esophageal wall thickening but no evidence for esophageal  perforation. Lungs/Pleura: No pneumothorax or pleural effusion or pleural hematoma. A few scattered lower lobe pulmonary nodules are stable. Musculoskeletal: No breast masses, supraclavicular or axillary adenopathy. The bony thorax is intact. No acute findings. The upper sternotomy is ununited. CT ABDOMEN PELVIS FINDINGS Hepatobiliary: Stable cystic area in the left  hepatic lobe. Gallbladder is surgically absent. Pneumobilia likely related to previous sphincterotomy. No worrisome hepatic lesions or evidence of acute hepatic injury. No perihepatic fluid collections/hematoma. The portal and hepatic veins are patent. Pancreas: No mass, inflammation or ductal dilatation. Spleen: Normal size.  No focal lesions. Adrenals/Urinary Tract: Adrenal glands are normal. Left renal calculus but no obstructing ureteral calculi or bladder calculi. No worrisome renal or bladder lesions are identified. Stomach/Bowel: The stomach, duodenum, small bowel and colon are unremarkable. No acute inflammatory changes, mass lesions or obstructive findings. Vascular/Lymphatic: The aorta is normal in caliber. No dissection. The branch vessels are patent. The major venous structures are patent. No mesenteric or retroperitoneal mass or adenopathy. Small scattered lymph nodes are noted. Reproductive: Surgically absent. Other: No free air or free fluid is identified in the abdomen/pelvis. Musculoskeletal: Remote T12 and L2 compression fractures. No acute bony findings. IMPRESSION: 1. No CT findings for esophageal perforation, pneumomediastinum or retroperitoneal hematoma. No free abdominal/pelvic fluid or air. 2. Mild distal esophageal wall thickening but no mass or intramural gas. 3. Stable surgical changes from coronary artery bypass surgery. 4. Stable age advanced coronary artery calcifications and LAD stent. 5. Status post cholecystectomy and sphincterotomy with pneumobilia. 6. Left renal calculus. 7. Remote T12 and L2 compression fractures. Electronically Signed   By: Marijo Sanes M.D.   On: 03/25/2020 15:33   DG Abdomen Acute W/Chest  Result Date: 03/25/2020 CLINICAL DATA:  Epigastric abdominal pain.  Recent endoscopy. EXAM: DG ABDOMEN ACUTE WITH 1 VIEW CHEST COMPARISON:  CT scan 03/25/2019 FINDINGS: The upright chest x-ray is unremarkable. No infiltrates, effusions or pneumothorax. There are surgical  changes from coronary artery bypass surgery noted along with a coronary artery stent. The bony structures are unremarkable. Unremarkable abdominal bowel gas pattern. Moderate scattered air throughout the colon. No findings for free air. The soft tissue shadows are maintained. Pneumobilia is noted and is a chronic finding due to prior cholecystectomy and sphincterotomy. The bony structures are intact. IMPRESSION: 1. No acute cardiopulmonary findings. 2. No plain film findings for an acute abdominal process. Electronically Signed   By: Marijo Sanes M.D.   On: 03/25/2020 12:46       LOS: 1 day   [place]  03/27/2020, 8:00 AM

## 2020-03-27 NOTE — Progress Notes (Signed)
Family Medicine Teaching Service Daily Progress Note Intern Pager: [phone]  Patient name: Gabrielle Page Medical record number: [id number] Date of birth: 1960-04-06 Age: 60 y.o. Gender: female  Primary Care Provider: Jaclynn Major, NP Consultants:  GI Code Status:  Full  Pt Overview and Major Events to Date:  2/10 - EGD and Colonoscopy 2/11 - Admitted with hemoptysis and melena; underwent EGD with clip placement 2/12 - Found beer can trash-can, initiated CIWAs   Assessment and Plan: Gabrielle Page is a 60 y.o. female presenting with hematemesis and abdominal pain . PMH is significant for  CAD (s/p 3v CABG), MI, CHF, Barrett's esophagus (2019), chronic constipation, GERD, hx of gastric ulcer bleed, IBS.  Upper GI bleed Repeat endoscopy on 2/12 did not show evidence of ulcers with recent bleeding.  Since that endoscopy, she has not had any further hematemesis and her report of melena appears to be significantly improving although not totally gone.  She is comfortable this morning without nausea or abdominal pain.  She was given a breakfast of soft foods with the plan to progress to a general diet at lunch if she is able to tolerate her breakfast.  Her hemoglobin did drop to 7.7 this morning (transfusion threshold 8) we will transfuse 1 unit and check her hemoglobin again this afternoon. -GI following, appreciate recommendations -Soft diet for breakfast, general diet planned for lunch -If bleeding symptoms persist, GI likely to do repeat EGD -Transfuse 1 unit RBCs, f/u H/H -Transfusion threshold is 8 due to cardiac history -Continue to hold home aspirin   Hypokalemia Mild hypokalemia to 3.4 this morning.  Per GI, it is appropriate to replete via p.o. potassium. -Replete with p.o. potassium  Alcohol use Concern for withdrawal due to increased anxiety and beer can recently found in her trash. -CIWA's  -PRN ativan per withdrawal protocol  Mood disorder Home medications  include trazodone, sertraline, buspirone, trazodone.  Her increased anxiety yesterday is likely related to not being able to take these medications.  She had been n.p.o. for over 24 hours and not been able to take her home medicine.  We have restarted some of her home medicine. -Sertraline 50 mg daily -Buspirone 7.5 mg daily -Trazodone 100 mg every morning, 200 mg nightly  Hypotension, improving -Midodrine 10 mg 4 times daily - Will continue to monitor  CAD -Hold ASA  FEN/GI: Protonix, Clear liquids PPx: SCDs   Status is: inpatient  The patient will require care spanning > 2 midnights and should be moved to inpatient because: Hemodynamically unstable  Dispo:  Patient From: Home  Planned Disposition: Home  Expected discharge date: 03/29/2020  Medically stable for discharge: No       Subjective:  She did have some increased anxiety overnight which has improved this morning.  She reports that she has not had any episodes of hematemesis in the past 24 hours.  She notes that she continues to have darkened stools but this seems to be significantly improved compared to admission.  She denies nausea or abdominal pain at this time.  Objective: Temp:  [97.6 F (36.4 C)-99.6 F (37.6 C)] 99.6 F (37.6 C) (02/13 0552) Pulse Rate:  [68-88] 85 (02/13 0634) Resp:  [16-20] 18 (02/13 0552) BP: (92-131)/(28-69) 131/61 (02/13 0634) SpO2:  [96 %-100 %] 96 % (02/13 0552)  Physical Exam: General: Alert and cooperative and appears to be in no acute distress.  Lying in bed comfortably. Cardio: Normal S1 and S2, no S3 or S4. Rhythm is regular.  3/6 crescendo  systolic murmur.   Pulm: Clear to auscultation bilaterally, no crackles, wheezing, or diminished breath sounds. Normal respiratory effort Abdomen: Bowel sounds normal. Abdomen soft and non-tender.  Extremities: No peripheral edema. Warm/ well perfused.  Strong radial pulses. Neuro: Cranial nerves grossly intact   Laboratory: Recent Labs   Lab 03/25/20 1150 03/26/20 0301 03/26/20 1601 03/27/20 0822  WBC 10.9* 11.1*  --   --   HGB 6.3* 7.5* 8.3* 7.7*  HCT 20.4* 20.9* 24.4*  --   PLT 391 198  --   --    Recent Labs  Lab 03/25/20 1150 03/25/20 2248 03/26/20 0555 03/26/20 1601 03/27/20 0719  NA 142   < > 143 142 139  K 2.9*   < > 2.5* 4.1 3.4*  CL 112*   < > 114* 116* 115*  CO2 16*   < > 17* 17* 16*  BUN 29*   < > 26* 19 12  CREATININE 0.82   < > 0.70 0.47 0.62  CALCIUM 8.8*   < > 8.5* 8.4* 8.2*  PROT 5.4*  --   --   --  4.6*  BILITOT 0.5  --   --   --  0.9  ALKPHOS 65  --   --   --  51  ALT 13  --   --   --  11  AST 18  --   --   --  16  GLUCOSE 129*   < > 113* 107* 106*   < > = values in this interval not displayed.     Imaging/Diagnostic Tests: No results found.   Matilde Haymaker, MD 03/27/2020, 9:33 AM PGY-3, [facility] Intern pager: [phone], text pages welcome

## 2020-03-27 NOTE — Telephone Encounter (Signed)
Please arrange for follow-up CBC later this week. Needs office follow-up with me or an APP in 2-3 weeks. Thanks.

## 2020-03-28 ENCOUNTER — Telehealth: Payer: Self-pay | Admitting: *Deleted

## 2020-03-28 ENCOUNTER — Telehealth: Payer: Self-pay

## 2020-03-28 DIAGNOSIS — I5032 Chronic diastolic (congestive) heart failure: Secondary | ICD-10-CM

## 2020-03-28 DIAGNOSIS — I25119 Atherosclerotic heart disease of native coronary artery with unspecified angina pectoris: Secondary | ICD-10-CM

## 2020-03-28 DIAGNOSIS — R1013 Epigastric pain: Secondary | ICD-10-CM | POA: Diagnosis not present

## 2020-03-28 DIAGNOSIS — F419 Anxiety disorder, unspecified: Secondary | ICD-10-CM | POA: Diagnosis not present

## 2020-03-28 DIAGNOSIS — K223 Perforation of esophagus: Secondary | ICD-10-CM

## 2020-03-28 DIAGNOSIS — Z9289 Personal history of other medical treatment: Secondary | ICD-10-CM

## 2020-03-28 LAB — TYPE AND SCREEN
ABO/RH(D): A NEG
Antibody Screen: NEGATIVE
Unit division: 0
Unit division: 0
Unit division: 0
Unit division: 0

## 2020-03-28 LAB — CBC WITH DIFFERENTIAL/PLATELET
Abs Immature Granulocytes: 0.05 10*3/uL (ref 0.00–0.07)
Basophils Absolute: 0 10*3/uL (ref 0.0–0.1)
Basophils Relative: 0 %
Eosinophils Absolute: 0.2 10*3/uL (ref 0.0–0.5)
Eosinophils Relative: 2 %
HCT: 28.5 % — ABNORMAL LOW (ref 36.0–46.0)
Hemoglobin: 10 g/dL — ABNORMAL LOW (ref 12.0–15.0)
Immature Granulocytes: 1 %
Lymphocytes Relative: 6 %
Lymphs Abs: 0.4 10*3/uL — ABNORMAL LOW (ref 0.7–4.0)
MCH: 29.2 pg (ref 26.0–34.0)
MCHC: 35.1 g/dL (ref 30.0–36.0)
MCV: 83.1 fL (ref 80.0–100.0)
Monocytes Absolute: 0.3 10*3/uL (ref 0.1–1.0)
Monocytes Relative: 5 %
Neutro Abs: 6 10*3/uL (ref 1.7–7.7)
Neutrophils Relative %: 86 %
Platelets: 206 10*3/uL (ref 150–400)
RBC: 3.43 MIL/uL — ABNORMAL LOW (ref 3.87–5.11)
RDW: 18.8 % — ABNORMAL HIGH (ref 11.5–15.5)
WBC: 7 10*3/uL (ref 4.0–10.5)
nRBC: 0 % (ref 0.0–0.2)

## 2020-03-28 LAB — BPAM RBC
Blood Product Expiration Date: 202202172359
Blood Product Expiration Date: 202202172359
Blood Product Expiration Date: 202202222359
Blood Product Expiration Date: 202202262359
ISSUE DATE / TIME: 202202111306
ISSUE DATE / TIME: 202202112250
ISSUE DATE / TIME: 202202121012
ISSUE DATE / TIME: 202202131347
Unit Type and Rh: 600
Unit Type and Rh: 600
Unit Type and Rh: 600
Unit Type and Rh: 600

## 2020-03-28 LAB — BASIC METABOLIC PANEL
Anion gap: 10 (ref 5–15)
BUN: 7 mg/dL (ref 6–20)
CO2: 17 mmol/L — ABNORMAL LOW (ref 22–32)
Calcium: 8.7 mg/dL — ABNORMAL LOW (ref 8.9–10.3)
Chloride: 113 mmol/L — ABNORMAL HIGH (ref 98–111)
Creatinine, Ser: 0.64 mg/dL (ref 0.44–1.00)
GFR, Estimated: >60 mL/min (ref >60)
Glucose, Bld: 99 mg/dL (ref 70–99)
Potassium: 3.2 mmol/L — ABNORMAL LOW (ref 3.5–5.1)
Sodium: 140 mmol/L (ref 135–145)

## 2020-03-28 LAB — CBC
HCT: 24.6 % — ABNORMAL LOW (ref 36.0–46.0)
Hemoglobin: 8.8 g/dL — ABNORMAL LOW (ref 12.0–15.0)
MCH: 29.5 pg (ref 26.0–34.0)
MCHC: 35.8 g/dL (ref 30.0–36.0)
MCV: 82.6 fL (ref 80.0–100.0)
Platelets: 176 10*3/uL (ref 150–400)
RBC: 2.98 MIL/uL — ABNORMAL LOW (ref 3.87–5.11)
RDW: 18.2 % — ABNORMAL HIGH (ref 11.5–15.5)
WBC: 7.2 10*3/uL (ref 4.0–10.5)
nRBC: 0 % (ref 0.0–0.2)

## 2020-03-28 MED ORDER — RANOLAZINE ER 500 MG PO TB12
1000.0000 mg | ORAL_TABLET | Freq: Two times a day (BID) | ORAL | Status: DC
  Filled 2020-03-28 (×2): qty 2

## 2020-03-28 MED ORDER — ATORVASTATIN CALCIUM 80 MG PO TABS
80.0000 mg | ORAL_TABLET | Freq: Every day | ORAL | Status: DC
  Administered 2020-03-28: 80 mg via ORAL
  Filled 2020-03-28: qty 1

## 2020-03-28 MED ORDER — TRAZODONE HCL 150 MG PO TABS: 300.0000 mg | ORAL_TABLET | Freq: Every day | ORAL | Status: DC

## 2020-03-28 MED ORDER — METOPROLOL TARTRATE 25 MG PO TABS: 25.0000 mg | ORAL_TABLET | Freq: Two times a day (BID) | ORAL | Status: DC

## 2020-03-28 MED ORDER — POTASSIUM CHLORIDE 20 MEQ PO PACK
40.0000 meq | PACK | Freq: Once | ORAL | Status: AC
  Administered 2020-03-28: 40 meq via ORAL
  Filled 2020-03-28: qty 2

## 2020-03-28 MED ORDER — LUBIPROSTONE 24 MCG PO CAPS
24.0000 ug | ORAL_CAPSULE | Freq: Two times a day (BID) | ORAL | Status: DC
  Administered 2020-03-28: 24 ug via ORAL
  Filled 2020-03-28 (×3): qty 1

## 2020-03-28 MED ORDER — ROPINIROLE HCL 0.5 MG PO TABS: 0.5000 mg | ORAL_TABLET | Freq: Every day | ORAL | Status: DC

## 2020-03-28 NOTE — Telephone Encounter (Signed)
No answer for post procedure call back. Left message for patient to call with questions or concerns. 

## 2020-03-28 NOTE — Telephone Encounter (Signed)
She will need an office appointment for 2 weeks

## 2020-03-28 NOTE — Telephone Encounter (Signed)
I will contact the patient to schedule once blood work is completed.

## 2020-03-28 NOTE — Progress Notes (Signed)
Gabrielle Page to be D/C'd per MD order. Discussed with the patient and all questions fully answered. ? VSS, Skin clean, dry and intact without evidence of skin break down, no evidence of skin tears noted. ? IV catheter discontinued intact. Site without signs and symptoms of complications. Dressing and pressure applied. ? An After Visit Summary was printed and given to the patient. Patient informed where to pickup prescriptions. ? D/c education completed with patient/family including follow up instructions, medication list, d/c activities limitations if indicated, with other d/c instructions as indicated by MD - patient able to verbalize understanding, all questions fully answered.  ? Patient instructed to return to ED, call 911, or call MD for any changes in condition.  ? Patient to be escorted via [place], and D/C home via private auto.

## 2020-03-28 NOTE — Telephone Encounter (Signed)
First attempt follow up call to pt, mailbox full, unable to lm.

## 2020-03-28 NOTE — Telephone Encounter (Signed)
Left message on machine to call back  

## 2020-03-28 NOTE — Progress Notes (Addendum)
Daily Rounding Note  03/28/2020, 8:38 AM  LOS: 2 days   SUBJECTIVE:   Chief complaint: Upper GI bleed with hematemesis. Small brown stools yest and today.  Has not ben receiving her usual Amitiza.   No nausea, no dizziness.  L eye is swollen but not painful, she denies trauma to this area and says she has had swelling in the same area before. Received IV Ativan overnight, again, for agitation.  OBJECTIVE:         Vital signs in last 24 hours:    Temp:  [98.5 F (36.9 C)-99.4 F (37.4 C)] 99.4 F (37.4 C) (02/14 0503) Pulse Rate:  [75-79] 79 (02/14 0503) Resp:  [16-18] 18 (02/14 0503) BP: (131-152)/(64-83) 131/69 (02/14 0503) SpO2:  [95 %-100 %] 95 % (02/14 0503) Last BM Date: 03/27/20 There were no vitals filed for this visit. General:   Looks chronically unwell.  Comfortable, pleasant, sleepy. There is swelling around the area of the left eye with a couple of small, linear red lines across the upper and lower lid. Heart: RRR. Chest: Clear bilaterally.  Active cough. Abdomen: Soft, active bowel sounds.  No tenderness or distention. Extremities: No CCE Neuro/Psych:    No CCE.  Intake/Output from previous day: 02/13 0701 - 02/14 0700 In: 1979.3 [P.O.:400; I.V.:1159.3; Blood:420] Out: -   Intake/Output this shift: No intake/output data recorded.  Lab Results: Recent Labs    03/25/20 1150 03/26/20 0301 03/26/20 1601 03/27/20 0822 03/27/20 2354  WBC 10.9* 11.1*  --   --  7.2  HGB 6.3* 7.5* 8.3* 7.7* 8.8*  HCT 20.4* 20.9* 24.4*  --  24.6*  PLT 391 198  --   --  176   BMET Recent Labs    03/26/20 0555 03/26/20 1601 03/27/20 0719  NA 143 142 139  K 2.5* 4.1 3.4*  CL 114* 116* 115*  CO2 17* 17* 16*  GLUCOSE 113* 107* 106*  BUN 26* 19 12  CREATININE 0.70 0.47 0.62  CALCIUM 8.5* 8.4* 8.2*   LFT Recent Labs    03/25/20 1150 03/27/20 0719  PROT 5.4* 4.6*  ALBUMIN 3.5 2.6*  AST 18 16  ALT 13 11   ALKPHOS 65 51  BILITOT 0.5 0.9   PT/INR Recent Labs    03/25/20 1158 03/26/20 0301  LABPROT 15.1 15.5*  INR 1.2 1.3*   Hepatitis Panel No results for input(s): HEPBSAG, HCVAB, HEPAIGM, HEPBIGM in the last 72 hours.  Studies/Results: No results found.   Scheduled Meds: . busPIRone  7.5 mg Oral TID  . folic acid  1 mg Oral Daily  . midodrine  10 mg Oral QID  . multivitamin with minerals  1 tablet Oral Daily  . pantoprazole  40 mg Oral BID  . sertraline  50 mg Oral Daily  . thiamine  100 mg Oral Daily   Or  . thiamine  100 mg Intravenous Daily  . traZODone  100 mg Oral q AM  . traZODone  200 mg Oral QHS   Continuous Infusions: . sodium chloride Stopped (03/27/20 1411)   PRN Meds:.LORazepam **OR** LORazepam   ASSESMENT:   *  Hematemesis occurring several hours post EGD and colonoscopy. 03/24/20 EGD for dysphagia, hx Barretts esophagus, bleeeding GU  2019.  : no stenosis, s/p 18 mm balloon dilatation, no wrents seen post dilt.  Gastritis biopsied 03/24/20 screening colonoscopy, chronic constipation, IBS?:  Nonbleeding internal hemorrhoids.  4 mm sessile polyp at transverse colon.  Moderate amount of stool throughout colon precluding full visualization of small and medium sized polyps.  Plan to repeat colonoscopy in 1 year due to poor prep.  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   03/25/2020 EGD.  Esophageal ulcers, 1 with VV but no active bleeding.  This area was clipped.  ? ulcers secondary to recent biopsy versus dilation trauma?  Unable to completely visualize stomach due to presence of food, but no gross blood or active bleeding observed. Protonix 40 po bid in place.    *   Anemia.  Hgb 6.3 >> 8.8.  Baseline 10 to 11 several months ago.  S/p 4 PRBCs.     PLAN   *   OK to discharge home.  Soft diet for next 10 days.   Protonix 40 mg po bid.     LE pelvic floor PT appointment has yet to be made.  Will defer this to the GI office. Restart her Amitza 24 mcg bid.   Consider outpatient barium esophagram to rule out dysmotility.  In reviewing the labs and radiology reports I do not see that it has ever been performed at [facility] or in the [facility] health system. We will have Dr. Tarri Glenn office arrange future follow-up.  This should be arranged after she undergoes pelvic floor PT evaluation.   Azucena Freed  03/28/2020, 8:38 AM Phone [phone]     Attending Physician Note   I have taken an interval history, reviewed the chart and examined the patient. I agree with the Advanced Practitioner's note, impression and recommendations.   UGI bleed from esophageal ulcer has resolved.  Anemia improved - Hgb now 8.8.   Soft diet for 10 days then advance as tolerated.  Resume Amitza 24 mcg bid.  Continue pantoprazole 40 mg po bid.  Outpatient pelvic floor PT as planned.  Outpatient GI follow up with Dr. Tarri Glenn.  GI signing off.   Lucio Edward, MD FACG [phone]

## 2020-03-28 NOTE — Telephone Encounter (Signed)
Dr. Larae Grooms called the DOD line from [facility] and stated that the patient has been admitted for a GI Bleed and is going to be discharged today or tomorrow. They are advising that the patient hold her Plavix and Aspirin at this time and are needing the patient to have labs completed post hospitalization to check on her HGB and HCT. Dr. Larae Grooms has requested that we place the order for this lab and that Dr. Bettina Gavia please advise when the patient can restart the medication based on her lab results.   Order for lab was placed and I will route this to Dr. Bettina Gavia at this time.

## 2020-03-28 NOTE — Discharge Instructions (Signed)
You were hospitalized at [facility] due to a GI bleed.  An endoscopy was performed which showed no active bleeding. We are so glad you are feeling better.  Be sure to follow-up with your regularly scheduled appointment on Thursday 03/31/2020 with Cardiology which will be a walk-in nurse visit to check your hemoglobin.  Please also be sure to follow-up with your PCP at your earliest convenience. Thank you for allowing [place] to be a part of your medical care. PLEASE CONTINUE TO TAKE ASPIRIN OR PLAVIX UNTIL ADVISED OTHERWISE. Please continue on a soft diet for about 10 days and make sure to take Protonix 40 mg twice daily.  Take care, [facility] family medicine team

## 2020-03-28 NOTE — Progress Notes (Signed)
Family Medicine Teaching Service Daily Progress Note Intern Pager: [phone]  Patient name: Gabrielle Page Medical record number: [id number] Date of birth: September 30, 1960 Age: 60 y.o. Gender: female  Primary Care Provider: Jaclynn Major, NP Consultants: GI Code Status: Full code  Pt Overview and Major Events to Date:  2/10 - EGD and Colonoscopy 2/11 - Admitted with hemoptysis and melena; underwent EGD with clip placement 2/12 - Found beer in trash-can, initiated CIWAs   Assessment and Plan: Gabrielle Page a 60 y.o.femalepresenting with hematemesis and abdominal pain. PMH is significant for CAD(s/p 3v CABG), MI, CHF, Barrett's esophagus (2019), chronic constipation, GERD, hx of gastric ulcer bleed, IBS.  Upper GI bleed Repeat endoscopy on 2/12 did not show evidence of ulcers with recent bleeding.  Since that endoscopy, she has not had any further hematemesis and her report of melena appears to be significantly improving although not totally gone. Hgb more recently 8.8, s/p 4 units pRBC. -GI following, appreciate recommendations -If bleeding symptoms persist, GI likely to do repeat EGD -pending am CBC -Transfusion threshold is 8 due to cardiac history -Continue to hold home aspirin  -protonix 40 mg bid  -soft diet, advance as tolerated -GI recommends repeat Hgb later this week with outpatient follow up in 2-3 weeks   Hypokalemia Most recently 3.4 yesterday with appropriate repletion given.  Per GI, it is appropriate to replete via p.o. potassium. -pending am BMP -Repletion as needed   Alcohol use Concern for withdrawal due to increased anxiety and beer can recently found in her trash. Most recently CIWAs 1>0.  -CIWAs with PRN ativan per protocol  -folic acid -thiamine   Mood disorder Home medications include trazodone, sertraline, buspirone, trazodone. Some home medications restarted. -Sertraline 50 mg daily -Buspirone 7.5 mg daily -Trazodone 100 mg  every morning, 200 mg nightly  Hypotension Improved, normotensive BP 131/69 this morning. -Midodrine 10 mg 4 times daily - Will continue to monitor  CAD -Hold ASA   FEN/GI: soft diet  PPx: SCDs   Status is: Inpatient    Dispo:  Patient From: Home  Planned Disposition: Home  Expected discharge date: 03/29/2020  Medically stable for discharge: No          Subjective:  No significant overnight events reported. Patient denies any pain or further concerns at this time. States that she feels good and would be comfortable discharging home if appropriate.   Objective: Temp:  [98.5 F (36.9 C)-99.4 F (37.4 C)] 99.4 F (37.4 C) (02/14 0503) Pulse Rate:  [75-79] 79 (02/14 0503) Resp:  [16-18] 18 (02/14 0503) BP: (131-152)/(64-83) 131/69 (02/14 0503) SpO2:  [95 %-100 %] 95 % (02/14 0503) Physical Exam: General: Patient sitting at the side of the bed, in no acute distress. Cardiovascular: systolic murmur auscultated, RRR Respiratory: lungs clear to auscultation bilaterally, breathing comfortably on room air Abdomen: soft, nontender, presence of active bowel sounds Extremities: no LE edema noted bilaterally, radial and dorsalis pedis pulses strong and equal bilaterally  Psych: mood appropriate, appears calm   Laboratory: Recent Labs  Lab 03/25/20 1150 03/26/20 0301 03/26/20 1601 03/27/20 0822 03/27/20 2354  WBC 10.9* 11.1*  --   --  7.2  HGB 6.3* 7.5* 8.3* 7.7* 8.8*  HCT 20.4* 20.9* 24.4*  --  24.6*  PLT 391 198  --   --  176   Recent Labs  Lab 03/25/20 1150 03/25/20 2248 03/26/20 0555 03/26/20 1601 03/27/20 0719  NA 142   < > 143 142 139  K 2.9*   < >  2.5* 4.1 3.4*  CL 112*   < > 114* 116* 115*  CO2 16*   < > 17* 17* 16*  BUN 29*   < > 26* 19 12  CREATININE 0.82   < > 0.70 0.47 0.62  CALCIUM 8.8*   < > 8.5* 8.4* 8.2*  PROT 5.4*  --   --   --  4.6*  BILITOT 0.5  --   --   --  0.9  ALKPHOS 65  --   --   --  51  ALT 13  --   --   --  11  AST 18  --    --   --  16  GLUCOSE 129*   < > 113* 107* 106*   < > = values in this interval not displayed.      Imaging/Diagnostic Tests: No results found.  Donney Dice, DO 03/28/2020, 7:23 AM PGY-1, [place] Intern pager: [phone], text pages welcome

## 2020-03-29 ENCOUNTER — Other Ambulatory Visit: Payer: Self-pay

## 2020-03-29 DIAGNOSIS — K920 Hematemesis: Secondary | ICD-10-CM

## 2020-03-29 NOTE — Discharge Summary (Addendum)
[facility] Discharge Summary  Patient name: Gregg Holster Medical record number: [id number] Date of birth: [birth year] Age: 60 y.o. Gender: female Date of Admission: 03/25/2020  Date of Discharge: 03/28/2020 Admitting Physician: Lenoria Chime, MD  Primary Care Provider: Jaclynn Major, NP Consultants: GI  Indication for Hospitalization: abdominal pain and hematemesis   Discharge Diagnoses/Problem List:  Upper GI bleed hypotension Hypokalemia Alcohol use  Mood disorder  CAD  Disposition: home  Discharge Condition: medically stable  Discharge Exam:  Temp:  [98.5 F (36.9 C)-99.4 F (37.4 C)] 99.4 F (37.4 C) (02/14 0503) Pulse Rate:  [75-79] 79 (02/14 0503) Resp:  [16-18] 18 (02/14 0503) BP: (131-152)/(64-83) 131/69 (02/14 0503) SpO2:  [95 %-100 %] 95 % (02/14 0503) Physical Exam: General: Patient sitting at the side of the bed, in no acute distress. Cardiovascular: systolic murmur auscultated, RRR Respiratory: lungs clear to auscultation bilaterally, breathing comfortably on room air Abdomen: soft, nontender, presence of active bowel sounds Extremities: no LE edema noted bilaterally, radial and dorsalis pedis pulses strong and equal bilaterally  Psych: mood appropriate, appears calm   Brief Hospital Course:  Gabrielle Page is a 60 y.o. female presenting with hematemesis and abdominal pain . PMH is significant for  CAD (s/p 3v CABG), MI, CHF, Barrett's esophagus (2019), chronic constipation, GERD, hx of gastric ulcer bleed, IBS.  Acute on chronic anemia  GI bleed Patient was admitted with hematemesis and melena in the setting of recent EGD and colonoscopy.  On admission, patient was found to have a hemoglobin of 6.3 and was transfused blood.  GI was consulted and patient underwent emergent EGD and found to have ulcer with visible vessel in the mid esophagus and hemostasis clip was placed. Home plavix held initially. Patient was  transfused a total of 4U pRBC with a Hgb of 10 upon discharge. GI recommendations including soft diet for 10 days, protonix 40 mg bid and GI outpatient follow up in 2-3 weeks. Patient restarted on Plavix and aspirin upon discharge with close follow up scheduled. Patient agreeable to next day PCP follow up as well as nurse visit for  outpatient cardiology to repeat CBC. Called Dr. Joya Gaskins office and spoke to [place] who stated that patient can come in on 2/17 for follow up blood work to determine if plavix and aspirin should be continued. Patient aware and agreeable to this plan upon discharge.  Hypokalemia Patient was admitted with a severely low potassium of 2.9.  Patient was persistently low and repleted as appropriate throughout this hospital stay. Upon discharge, patient's potassium was 3.2 with appropriate repletion given.   Alcohol use Patient denied alcohol use but was found to have an empty beer can in her trash.  Due to concern for alcohol withdrawal, CIWA protocol was initiated and patient noted to have 1 and 0 respectively.  Patient would benefit from counseling regarding frequent alcohol use.   All other issues chronic and stable.   Issues for follow-up: 1. Recommend outpatient monitoring of potassium, repeat BMP. Patient had recurrent hypokalemia.  2. Repeat CBC, transfusion threshold 8 given cardiac history. Reassess whether to continue aspirin and plavix dual therapy.  3. Readdress alcohol use as patient denied but there was an empty beer can found in her hospital garbage can. 4. PCP follow up for depression and anxiety, to reassess and alter medications as appropriate. Patient may benefit from seeing a therapist outpatient.    Significant Procedures:  2/12: EGD showed no evidence of active bleeding.   Significant  Labs and Imaging:  Recent Labs  Lab 03/26/20 0301 03/26/20 1601 03/27/20 0822 03/27/20 2354 03/28/20 1152  WBC 11.1*  --   --  7.2 7.0  HGB 7.5* 8.3* 7.7* 8.8*  10.0*  HCT 20.9* 24.4*  --  24.6* 28.5*  PLT 198  --   --  176 206   Recent Labs  Lab 03/25/20 1150 03/25/20 2248 03/26/20 0301 03/26/20 0555 03/26/20 1601 03/27/20 0719 03/28/20 1152  NA 142 143 142 143 142 139 140  K 2.9* 2.9* 2.7* 2.5* 4.1 3.4* 3.2*  CL 112* 113* 114* 114* 116* 115* 113*  CO2 16* 17* 17* 17* 17* 16* 17*  GLUCOSE 129* 103* 101* 113* 107* 106* 99  BUN 29* 29* 27* 26* 19 12 7   CREATININE 0.82 0.72 0.69 0.70 0.47 0.62 0.64  CALCIUM 8.8* 8.7* 8.5* 8.5* 8.4* 8.2* 8.7*  MG  --  1.0*  --  2.0  --   --   --   ALKPHOS 65  --   --   --   --  51  --   AST 18  --   --   --   --  16  --   ALT 13  --   --   --   --  11  --   ALBUMIN 3.5  --   --   --   --  2.6*  --       Results/Tests Pending at Time of Discharge:  No results found.  Discharge Medications:  Allergies as of 03/28/2020      Reactions   Amiodarone Diarrhea, Nausea And Vomiting   In combination with colchicine   Colchicine Diarrhea, Nausea And Vomiting   Pt states she had rnx to medication; severe V/D   Compazine [prochlorperazine Edisylate] Other (See Comments)   SEIZURE   Other Other (See Comments), Cough   Steroids cause uncontrollable coughing      Medication List    STOP taking these medications   acetaminophen 325 MG tablet Commonly known as: TYLENOL   ondansetron 4 MG disintegrating tablet Commonly known as: ZOFRAN-ODT     TAKE these medications   albuterol 108 (90 Base) MCG/ACT inhaler Commonly known as: VENTOLIN HFA Inhale 2 puffs into the lungs every 6 (six) hours as needed for wheezing or shortness of breath.   aspirin EC 81 MG tablet Take 81 mg by mouth daily.   atorvastatin 80 MG tablet Commonly known as: LIPITOR Take 1 tablet (80 mg total) by mouth daily at 6 PM. What changed: when to take this   busPIRone 15 MG tablet Commonly known as: BUSPAR Take 7.5 mg by mouth 3 (three) times daily.   clopidogrel 75 MG tablet Commonly known as: PLAVIX Take 75 mg by mouth  daily.   fluocinonide 0.05 % external solution Commonly known as: LIDEX Apply 1 application topically 2 (two) times daily.   furosemide 40 MG tablet Commonly known as: LASIX Take 1 tablet (40 mg total) by mouth 3 (three) times a week. What changed:   when to take this  reasons to take this   ketoconazole 2 % shampoo Commonly known as: NIZORAL Apply 1 application topically 2 (two) times a week.   lubiprostone 24 MCG capsule Commonly known as: AMITIZA Take 1 capsule (24 mcg total) by mouth 2 (two) times daily with a meal.   metoprolol tartrate 25 MG tablet Commonly known as: LOPRESSOR Take 1 tablet (25 mg total) by mouth 2 (two) times daily.  midodrine 10 MG tablet Commonly known as: PROAMATINE Take 10 mg by mouth 3 (three) times daily.   multivitamin with minerals Tabs tablet Take 1 tablet by mouth daily.   nitroGLYCERIN 0.4 MG SL tablet Commonly known as: NITROSTAT Place 0.4 mg under the tongue every 5 (five) minutes as needed for chest pain.   pantoprazole 40 MG tablet Commonly known as: PROTONIX Take 40 mg by mouth 2 (two) times daily.   potassium chloride SA 20 MEQ tablet Commonly known as: KLOR-CON Take 20 mEq by mouth daily as needed (with each dose of furosemide).   ranolazine 1000 MG SR tablet Commonly known as: RANEXA Take 1 tablet (1,000 mg total) by mouth 2 (two) times daily.   rOPINIRole 0.5 MG tablet Commonly known as: REQUIP Take 0.5 mg by mouth at bedtime.   sertraline 50 MG tablet Commonly known as: ZOLOFT Take 50 mg by mouth daily.   trazodone 300 MG tablet Commonly known as: DESYREL Take 300 mg by mouth at bedtime.   triamcinolone ointment 0.1 % Commonly known as: KENALOG Apply 1 application topically 2 (two) times daily.       Discharge Instructions: Please refer to Patient Instructions section of EMR for full details.  Patient was counseled important signs and symptoms that should prompt return to medical care, changes in  medications, dietary instructions, activity restrictions, and follow up appointments.   Follow-Up Appointments:  Follow-up Information    Jaclynn Major, NP Follow up.   Specialty: Nurse Practitioner Contact information: [address] [place] [address] [phone]        Richardo Priest, MD. Go on 03/31/2020.   Specialties: Cardiology, Radiology Why: Please go to this office on 03/31/2020 for a nurse visit to recheck labs (hemoglobin), Dr. Bettina Gavia will then be in contact with you regarding the results.  Contact information: [place] [address] [phone]               Carollee Leitz, MD 03/29/2020, 2:32 PM PGY-1, [place]

## 2020-03-29 NOTE — Telephone Encounter (Signed)
Spoke with pt and she knows to come for CBC later this week, scheduled pt to see Dr. Tarri Glenn 3/10/@3 :40pm. Pt aware.

## 2020-04-01 ENCOUNTER — Ambulatory Visit (INDEPENDENT_AMBULATORY_CARE_PROVIDER_SITE_OTHER): Payer: Medicaid Other | Admitting: Cardiology

## 2020-04-01 ENCOUNTER — Other Ambulatory Visit: Payer: Self-pay

## 2020-04-01 VITALS — BP 110/68 | HR 64 | Ht 63.0 in | Wt 127.0 lb

## 2020-04-01 DIAGNOSIS — I5032 Chronic diastolic (congestive) heart failure: Secondary | ICD-10-CM

## 2020-04-01 DIAGNOSIS — N200 Calculus of kidney: Secondary | ICD-10-CM | POA: Insufficient documentation

## 2020-04-01 DIAGNOSIS — D500 Iron deficiency anemia secondary to blood loss (chronic): Secondary | ICD-10-CM

## 2020-04-01 DIAGNOSIS — K922 Gastrointestinal hemorrhage, unspecified: Secondary | ICD-10-CM

## 2020-04-01 DIAGNOSIS — E782 Mixed hyperlipidemia: Secondary | ICD-10-CM

## 2020-04-01 DIAGNOSIS — I509 Heart failure, unspecified: Secondary | ICD-10-CM | POA: Insufficient documentation

## 2020-04-01 DIAGNOSIS — I950 Idiopathic hypotension: Secondary | ICD-10-CM

## 2020-04-01 DIAGNOSIS — I25708 Atherosclerosis of coronary artery bypass graft(s), unspecified, with other forms of angina pectoris: Secondary | ICD-10-CM

## 2020-04-01 DIAGNOSIS — R569 Unspecified convulsions: Secondary | ICD-10-CM | POA: Insufficient documentation

## 2020-04-01 NOTE — Progress Notes (Signed)
Cardiology Office Note:    Date:  04/01/2020   ID:  Gabrielle Page, DOB [birth year], MRN [id number]  PCP:  Jaclynn Major, NP  Cardiologist:  Shirlee More, MD    Referring MD: Jaclynn Major, NP    ASSESSMENT:    1. Gastrointestinal hemorrhage, unspecified gastrointestinal hemorrhage type   2. Iron deficiency anemia due to chronic blood loss   3. 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 Chronic diastolic heart failure ([place])   5. Idiopathic hypotension   6. Mixed hyperlipidemia    PLAN:    In order of problems listed above:  1. She has been continued on her dual antiplatelet therapy I do not need to address the issue today.  I will go ahead and draw CBC as she is post transfusion for acute blood loss superimposed on chronic iron deficiency.  Was seen by hematology at [facility] 2. Stable CAD having no angina continue current treatment including beta-blocker and ranolazine that has been remarkably effective in alleviating anginal symptoms 3. Heart failure is mildly decompensated from anemia IV fluid loading and transfusion.  Will use a weight-based diuretic approach. 4. Stable no recurrent orthostatic hypotension on midodrine 5. Stable continue her statin.   Next appointment: 3 months with me   Medication Adjustments/Labs and Tests Ordered: Current medicines are reviewed at length with the patient today.  Concerns regarding medicines are outlined above.  Orders Placed This Encounter  Procedures  . Pro b natriuretic peptide (BNP)  . CBC   No orders of the defined types were placed in this encounter.   Chief Complaint  Patient presents with  . Follow-up    She was recently at [facility] with GI bleed following endoscopy she had an esophageal ulcer with visible vessel that was clipped and was transfused.  The discharge summary said she is to see me to decide on restarting clopidogrel and to  draw a CBC.    History of Present Illness:    Gabrielle Page is a 60 y.o. female with a hx of CAD with CABG chronic diastolic heart failure chronic anemia iron deficiency hyperlipidemia and idiopathic hypotension last seen 12/14/2019.  At that time she had had a fall and vertebral compression fracture.  I reviewed the last GI note at discharge and there is no comment on reinitiation of antiplatelet therapy.  Compliance with diet, lifestyle and medications: Yes  At discharge she was resumed on aspirin and clopidogrel.  She has an appointment GI [facility] the second week of March and I will go ahead and draw a CBC today.  Her weight was up 13 pounds leaving the hospital a little short of breath her diuretic dose was increased and she is down for 5 pounds but still edematous and will use a weight-based approach for her diuretic with her chronic diastolic heart failure.  Fortunately she has been continued on midodrine and has had no orthostatic hypotension.  No chest pain palpitation or syncope.  She is still pending thoracic vertebral kyphoplasty.  She was admitted to [facility] 03/25/2020 to 03/28/2020 with upper GI bleed.  She had hematemesis and melena globin is 6.3 and was transfused.  She underwent endoscopy had a esophageal ulcer with a visible vessel not actively bleeding but which was clipped Plavix was held.  She received a total of 4 units of packed blood cells with a discharge hemoglobin of 10.0 she was instructed to follow-up with cardiology as  an outpatient to repeat her CBC.  Her hemoglobin was 2.9 and discharge was 3.2  Her last coronary angiogram was 03/05/2019 that time was felt to have stable CAD recommendations were to optimize medical therapy.The   Left thoracic artery graft to LAD was patent without disease vein graft to diagonal was occluded vein graft to the marginal was occluded and there is a chronic lesion in the marginal 90% stenosis following the anastomotic site of  the occluded saphenous vein graft not amenable to percutaneous intervention. Past Medical History:  Diagnosis Date  . Abnormal abdominal CT scan 05/14/2019  . Acute ST elevation myocardial infarction (STEMI) of inferolateral wall ([place]) 11/24/2018  . Anemia 05/14/2019  . Anxiety 05/14/2019  . Asthma   . CHF (congestive heart failure) ([place])   . Chronic bronchitis ([place])    "usually get it q yr" (11/05/2016)  . Chronic diastolic heart failure ([place]) 04/17/2017  . Constipation 05/14/2019  . Coronary artery disease   . Coronary artery disease involving native coronary artery of native heart with unstable angina pectoris ([place])   . Coronary artery disease involving native coronary artery with angina pectoris ([place]) 04/08/2017  . Fecal impaction ([place]) 05/14/2019  . GERD (gastroesophageal reflux disease)   . Headache    "weekly" (11/05/2016)  . Heart murmur    "dx'd when I was a child"  . Hepatitis 1991   "when I had my gallbladder out" (11/05/2016)  . History of blood transfusion 1985   S/P childbirth  . History of kidney stones   . History of peptic ulcer 05/14/2019  . HLD (hyperlipidemia) 11/27/2018  . HTN (hypertension) 05/14/2019  . Hypokalemia 04/17/2017  . Iron deficiency anemia 03/07/2019  . Irritable bowel syndrome 05/14/2019  . Kidney stones   . LBBB (left bundle branch block) 11/05/2016  . NSTEMI (non-ST elevated myocardial infarction) ([place]) 11/05/2016   9/18 PCI/DESx1 to p/mLAD  . Orthostatic hypotension   . Pancreatitis 1991  . Pericardial effusion 04/08/2017  . Pneumonia    "twice" (11/05/2016)  . 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 Seizures ([place])   . Severe mitral regurgitation 05/14/2019  . Syncope 05/14/2019  . Therapeutic opioid induced constipation 05/14/2019  . Unstable angina ([place]) 03/04/2019    Past Surgical History:  Procedure Laterality Date  . ABDOMINAL HYSTERECTOMY  1990  . ANKLE FRACTURE SURGERY  2021   left  . APPENDECTOMY  1992  . BILE DUCT EXPLORATION  1991   "reconstruction"  .  CESAREAN SECTION  1985  . CHOLECYSTECTOMY OPEN  1991  . COLONOSCOPY    . CORONARY ANGIOPLASTY WITH STENT PLACEMENT  11/05/2016  . CORONARY ARTERY BYPASS GRAFT N/A 03/26/2017   Procedure: CORONARY ARTERY BYPASS GRAFTING (CABG) x 3 WITH ENDOSCOPIC HARVESTING OF RIGHT SAPHENOUS VEIN;  Surgeon: Ivin Poot, MD;  Location: [facility];  Service: Open Heart Surgery;  Laterality: N/A;  . CORONARY STENT INTERVENTION N/A 11/05/2016   Procedure: CORONARY STENT INTERVENTION;  Surgeon: Martinique, Peter M, MD;  Location: [place] [facility];  Service: Cardiovascular;  Laterality: N/A;  . CYSTOSCOPY WITH STENT PLACEMENT  "3 times" (11/05/2016)  . DILATION AND CURETTAGE OF UTERUS  1989  . ESOPHAGOGASTRODUODENOSCOPY (EGD) WITH PROPOFOL N/A 03/25/2020   Procedure: ESOPHAGOGASTRODUODENOSCOPY (EGD) WITH PROPOFOL;  Surgeon: Yetta Flock, MD;  Location: [facility];  Service: Gastroenterology;  Laterality: N/A;  . HEMOSTASIS CLIP PLACEMENT  03/25/2020   Procedure: HEMOSTASIS CLIP PLACEMENT;  Surgeon: Yetta Flock, MD;  Location: [place];  Service: Gastroenterology;;  .  INTRAVASCULAR ULTRASOUND/IVUS N/A 03/22/2017   Procedure: Intravascular Ultrasound/IVUS;  Surgeon: Martinique, Peter M, MD;  Location: [place] [facility];  Service: Cardiovascular;  Laterality: N/A;  . KNEE ARTHROSCOPY Right 2000s  . LEFT HEART CATH AND CORONARY ANGIOGRAPHY N/A 11/05/2016   Procedure: LEFT HEART CATH AND CORONARY ANGIOGRAPHY;  Surgeon: Martinique, Peter M, MD;  Location: [place] [facility];  Service: Cardiovascular;  Laterality: N/A;  . LEFT HEART CATH AND CORONARY ANGIOGRAPHY N/A 03/22/2017   Procedure: LEFT HEART CATH AND CORONARY ANGIOGRAPHY;  Surgeon: Martinique, Peter M, MD;  Location: [facility] [facility];  Service: Cardiovascular;  Laterality: N/A;  . LEFT HEART CATH AND CORS/GRAFTS ANGIOGRAPHY N/A 03/05/2019   Procedure: LEFT HEART CATH AND CORS/GRAFTS ANGIOGRAPHY;  Surgeon: Leonie Man, MD;  Location: [place] [facility];   Service: Cardiovascular;  Laterality: N/A;  . NASAL SINUS SURGERY  ~ 2009   "exposed to mildew; had to have the infectoin drained; cut the outside of my face"  . OVARIAN CYST SURGERY  X 6  . RIGHT/LEFT HEART CATH AND CORONARY/GRAFT ANGIOGRAPHY N/A 11/24/2018   Procedure: RIGHT/LEFT HEART CATH AND CORONARY/GRAFT ANGIOGRAPHY;  Surgeon: Leonie Man, MD;  Location: [facility];  Service: Cardiovascular;  Laterality: N/A;  . TEE WITHOUT CARDIOVERSION N/A 03/26/2017   Procedure: TRANSESOPHAGEAL ECHOCARDIOGRAM (TEE);  Surgeon: Prescott Gum, [name], MD;  Location: [facility];  Service: Open Heart Surgery;  Laterality: N/A;  . UPPER GASTROINTESTINAL ENDOSCOPY      Current Medications: Current Meds  Medication Sig  . albuterol (VENTOLIN HFA) 108 (90 Base) MCG/ACT inhaler Inhale 2 puffs into the lungs every 6 (six) hours as needed for wheezing or shortness of breath.  Marland Kitchen aspirin EC 81 MG tablet Take 81 mg by mouth daily.  Marland Kitchen atorvastatin (LIPITOR) 80 MG tablet Take 1 tablet (80 mg total) by mouth daily at 6 PM. (Patient taking differently: Take 80 mg by mouth at bedtime.)  . busPIRone (BUSPAR) 15 MG tablet Take 7.5 mg by mouth 3 (three) times daily.  . clopidogrel (PLAVIX) 75 MG tablet Take 75 mg by mouth daily.  . fluocinonide (LIDEX) 0.05 % external solution Apply 1 application topically 2 (two) times daily.  . furosemide (LASIX) 40 MG tablet Take 1 tablet (40 mg total) by mouth 3 (three) times a week. (Patient taking differently: Take 40 mg by mouth daily as needed for fluid or edema (water weight gain).)  . ketoconazole (NIZORAL) 2 % shampoo Apply 1 application topically 2 (two) times a week.  . lubiprostone (AMITIZA) 24 MCG capsule Take 1 capsule (24 mcg total) by mouth 2 (two) times daily with a meal.  . metoprolol tartrate (LOPRESSOR) 25 MG tablet Take 1 tablet (25 mg total) by mouth 2 (two) times daily.  . midodrine (PROAMATINE) 10 MG tablet Take 10 mg by mouth 3 (three) times daily.  .  Multiple Vitamin (MULTIVITAMIN WITH MINERALS) TABS tablet Take 1 tablet by mouth daily.  . nitroGLYCERIN (NITROSTAT) 0.4 MG SL tablet Place 0.4 mg under the tongue every 5 (five) minutes as needed for chest pain.  Marland Kitchen ofloxacin (OCUFLOX) 0.3 % ophthalmic solution INSTILL 1 DROP INTO LEFT EYE EVERY 4 HOURS ON DAY 1-2, THEN 1 DROP 4 TIMES DAILY FOR DAYS 3-7.  . pantoprazole (PROTONIX) 40 MG tablet Take 40 mg by mouth 2 (two) times daily.  . potassium chloride SA (KLOR-CON) 20 MEQ tablet Take 20 mEq by mouth daily as needed (with each dose of furosemide).  . ranolazine (RANEXA)  1000 MG SR tablet Take 1 tablet (1,000 mg total) by mouth 2 (two) times daily.  Marland Kitchen rOPINIRole (REQUIP) 0.5 MG tablet Take 0.5 mg by mouth at bedtime.  . sertraline (ZOLOFT) 50 MG tablet Take 50 mg by mouth daily.  . trazodone (DESYREL) 300 MG tablet Take 300 mg by mouth at bedtime.  . triamcinolone ointment (KENALOG) 0.1 % Apply 1 application topically 2 (two) times daily.     Allergies:   Amiodarone, Colchicine, Compazine [prochlorperazine edisylate], and Other   Social History   Socioeconomic History  . Marital status: Divorced    Spouse name: Not on file  . Number of children: Not on file  . Years of education: Not on file  . Highest education level: Not on file  Occupational History  . Occupation: disable  Tobacco Use  . Smoking status: Never Smoker  . Smokeless tobacco: Never Used  Vaping Use  . Vaping Use: Never used  Substance and Sexual Activity  . Alcohol use: Not Currently    Comment: 11/05/2016 "maybe 1 glass of wine/month"  . Drug use: No  . Sexual activity: Not Currently  Other Topics Concern  . Not on file  Social History Narrative   Right handed    Lives with boy friend    Social Determinants of Health   Financial Resource Strain: Not on file  Food Insecurity: Not on file  Transportation Needs: Not on file  Physical Activity: Not on file  Stress: Not on file  Social Connections: Not on  file     Family History: The patient's family history includes Alcohol abuse in her father; Breast cancer in her sister; Colon cancer in her paternal grandfather; Colon polyps in her paternal grandfather; Diabetes Mellitus II in her father; Heart attack in her father; Hypertension in her father; Suicidality in her mother. There is no history of Esophageal cancer, Rectal cancer, or Stomach cancer. ROS:   Please see the history of present illness.    All other systems reviewed and are negative.  EKGs/Labs/Other Studies Reviewed:    The following studies were reviewed today:  EKG:: Hospital performed on 03/28/2020 independently reviewed and showed sinus rhythm left bundle branch block  Recent Labs: 03/26/2020: Magnesium 2.0 03/27/2020: ALT 11 03/28/2020: BUN 7; Creatinine, Ser 0.64; Hemoglobin 10.0; Platelets 206; Potassium 3.2; Sodium 140  Recent Lipid Panel    Component Value Date/Time   CHOL 154 12/14/2019 0000   TRIG 245 (H) 12/14/2019 0000   HDL 39 (L) 12/14/2019 0000   CHOLHDL 3.9 12/14/2019 0000   CHOLHDL 4.4 03/05/2019 0452   VLDL 39 03/05/2019 0452   LDLCALC 75 12/14/2019 0000    Physical Exam:    VS:  BP 110/68   Pulse 64   Ht 5\' 3"  (1.6 m)   Wt 127 lb (57.6 kg)   SpO2 99%   BMI 22.50 kg/m     Wt Readings from Last 3 Encounters:  04/01/20 127 lb (57.6 kg)  03/24/20 123 lb (55.8 kg)  03/17/20 121 lb 3.2 oz (55 kg)     GEN: She has mild pallor of the skin well nourished, well developed in no acute distress HEENT: Normal NECK: She has mild JVD; No carotid bruits LYMPHATICS: No lymphadenopathy CARDIAC: RRR, no murmurs, rubs, gallops RESPIRATORY:  Clear to auscultation without rales, wheezing or rhonchi  ABDOMEN: Soft, non-tender, non-distended MUSCULOSKELETAL: Bilateral 1+ pitting lower extremity edema; No deformity  SKIN: Warm and dry NEUROLOGIC:  Alert and oriented x 3 PSYCHIATRIC:  Normal  affect    Signed, Shirlee More, MD  04/01/2020 9:00 AM    [facility]

## 2020-04-01 NOTE — Patient Instructions (Signed)
Medication Instructions:  Your physician has recommended you make the following change in your medication:  CHANGE: If you weight 120 lbs or more please take 40 mg of furosemide twice daily. If you weigh 117 or less please take 40 mg of furosemide once daily. If you are in-between please continue your 40 mg in the morning and 20 mg in the evening.  *If you need a refill on your cardiac medications before your next appointment, please call your pharmacy*   Lab Work: Your physician recommends that you return for lab work in: TODAY CBC, ProBNP If you have labs (blood work) drawn today and your tests are completely normal, you will receive your results only by: Marland Kitchen MyChart Message (if you have MyChart) OR . A paper copy in the mail If you have any lab test that is abnormal or we need to change your treatment, we will call you to review the results.   Testing/Procedures: None   Follow-Up: At [facility], you and your health needs are our priority.  As part of our continuing mission to provide you with exceptional heart care, we have created designated Provider Care Teams.  These Care Teams include your primary Cardiologist (physician) and Advanced Practice Providers (APPs -  Physician Assistants and Nurse Practitioners) who all work together to provide you with the care you need, when you need it.  We recommend signing up for the patient portal called "MyChart".  Sign up information is provided on this After Visit Summary.  MyChart is used to connect with patients for Virtual Visits (Telemedicine).  Patients are able to view lab/test results, encounter notes, upcoming appointments, etc.  Non-urgent messages can be sent to your provider as well.   To learn more about what you can do with MyChart, go to [email or web address].    Your next appointment:   3 month(s)  The format for your next appointment:   In Person  Provider:   Shirlee More, MD   Other Instructions

## 2020-04-02 LAB — CBC
Hematocrit: 30.8 % — ABNORMAL LOW (ref 34.0–46.6)
Hemoglobin: 10.1 g/dL — ABNORMAL LOW (ref 11.1–15.9)
MCH: 28.4 pg (ref 26.6–33.0)
MCHC: 32.8 g/dL (ref 31.5–35.7)
MCV: 87 fL (ref 79–97)
Platelets: 323 10*3/uL (ref 150–450)
RBC: 3.56 x10E6/uL — ABNORMAL LOW (ref 3.77–5.28)
RDW: 16.7 % — ABNORMAL HIGH (ref 11.7–15.4)
WBC: 9.4 10*3/uL (ref 3.4–10.8)

## 2020-04-02 LAB — PRO B NATRIURETIC PEPTIDE: NT-Pro BNP: 1710 pg/mL — ABNORMAL HIGH (ref 0–287)

## 2020-04-03 ENCOUNTER — Encounter: Payer: Self-pay | Admitting: Gastroenterology

## 2020-04-04 ENCOUNTER — Telehealth: Payer: Self-pay

## 2020-04-04 NOTE — Telephone Encounter (Signed)
-----   Message from Richardo Priest, MD sent at 04/03/2020  2:29 PM EST ----- Good/stable labs Hemoglobin is much better than recent hospitalization We had increased her diuretic and she is using weight-based diuretic dosage now.

## 2020-04-04 NOTE — Telephone Encounter (Signed)
Spoke with patient regarding results and recommendation.  Patient verbalizes understanding and is agreeable to plan of care. Advised patient to call back with any issues or concerns.  

## 2020-04-14 ENCOUNTER — Other Ambulatory Visit: Payer: Self-pay

## 2020-04-14 MED ORDER — FUROSEMIDE 40 MG PO TABS: 40.0000 mg | ORAL_TABLET | ORAL | 3 refills | Status: DC

## 2020-04-21 ENCOUNTER — Ambulatory Visit: Payer: Medicaid Other | Admitting: Gastroenterology

## 2020-05-30 ENCOUNTER — Ambulatory Visit: Payer: Medicaid Other | Admitting: Gastroenterology

## 2020-07-06 ENCOUNTER — Ambulatory Visit: Payer: Medicaid Other | Admitting: Cardiology

## 2020-07-15 ENCOUNTER — Other Ambulatory Visit: Payer: Self-pay | Admitting: Gastroenterology

## 2020-07-27 ENCOUNTER — Encounter: Payer: Self-pay | Admitting: Gastroenterology

## 2020-07-27 ENCOUNTER — Ambulatory Visit: Payer: Medicaid Other | Admitting: Gastroenterology

## 2020-07-27 VITALS — BP 100/60 | HR 59 | Ht 63.0 in | Wt 114.8 lb

## 2020-07-27 DIAGNOSIS — R140 Abdominal distension (gaseous): Secondary | ICD-10-CM

## 2020-07-27 MED ORDER — PANTOPRAZOLE SODIUM 40 MG PO TBEC: 40.0000 mg | DELAYED_RELEASE_TABLET | Freq: Two times a day (BID) | ORAL | 2 refills | Status: AC

## 2020-07-27 NOTE — Progress Notes (Signed)
Referring Provider: Jaclynn Major, NP Primary Care Physician:  Jaclynn Major, NP  Chief complaint:  Hospital follow-up  IMPRESSION:  GI blood loss anemia associated with esophageal biopsy bleeding Dysphagia/choking with solids    - Similar symptoms have improved with empiric dilation in the past    - EGD with dilation provided symptomatic relief    - Esophageal biopsies negative for EOE 2/22    - Consider MBBS in the future given potential for oral pharyngeal component to her dysphagia Bloating and abdominal discomfort    - Etiology unclear.  Prior CT at [facility] in 2019 showed no cause.    - ? Related to constipation Barrett's Esophagus on EGD 2019 at [place] segment <1cm Barrett's esophagus on EGD 03/24/2020    - Continue daily proton pump inhibitor Gastric ulcer bleed requiring hospitalization at [facility] 2019    - Likely due to NSAIDs as gastric biopsies were negative for H. Pylori    - No ongoing PUD on EGD 03/2020 Reflux esophagitis on EGD 2019 Chronic constipation with suspected dyssynergic defecation    - Worsened by recent use of oxycodone    - May be exacerbated by immobility associated with ongoing back pain    - No response to prune juice or magnesium oxide    - Intolerant of Linzess due to associated severe bloating    - Trial of Amitiza    - Referral for anorectal manometry after endoscopy Colon cancer screening    - colonoscopy at [facility] 2019 was not sufficient for colon cancer screening    - [facility] MD recommended colonoscopy in 1 years IBS by history  PLAN: - Xifaxan 550mg  TID, if not approved by insurance will substitute with Septra DS BID x 14 days - Continue pantoprazole 40 mg BID - Continue Amitiza 24 mcg BID - Consider anorectal manometry if constipation recurs   Please see the "Patient Instructions" section for addition details about the plan.  HPI: Myrella Fahs is a 60 y.o. female who returns in hospital follow-up.  She  has a history of Barrett's Esophagus on EGD in 2005 and 0219 without dysplasia, IBS, diverticulosis without diverticulitis, gastric ulcers with associated bleeding 8/19, hiatal hernia and reflux, chronic constipation not relieved by magnesium and prune juice. She did not tolerate Linzess due to progressive bloating.   She had an EGD 03/24/2020 to evaluate for dysphagia and a history of Barrett's esophagus.  Empiric balloon dilation was performed with an 18 mm balloon.  There was no rent and no resistance to a fully inflated ballon.  Esophageal biopsies confirmed short-segment Barrett's esophagus and gastric biopsies showed chronic gastritis and gastropathy.  Concurrent screening colonoscopy showed nonbleeding internal hemorrhoids, a small polyp in the transverse colon, and a moderate amount of stool throughout the colon precluding full visualization.  Surveillance colonoscopy recommended in 1 year.  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    She developed hematemesis occurring several hours after the EGD requiring hospitalization and 4 units of PRBC. Presenting hemoglobin was 6.3.  Follow-up EGD 03/25/2020 showed esophageal ulcers at the locations of prior biopsies with visible vessel.  This was successfully treated with Endo Clip.  No further bleeding noted.  Returns today in hospital follow-up. Overall she is doing okay.  Diarrhea 2-3 times week, starts mushy and becomes clear liquid over the course of the day. There is associated gas and bloating. Some distension. Gas improves with defecation. No constipation.  No ongoing dysphagia.  No further rectal bleeding.     Past Medical History:  Diagnosis Date   Abnormal abdominal CT scan 05/14/2019   Acute ST elevation myocardial infarction (STEMI) of inferolateral wall (HCC) 11/24/2018   Anemia 05/14/2019   Anxiety 05/14/2019   Asthma    CHF (congestive heart failure) ([place])    Chronic bronchitis ([place])    "usually get it q yr"  (11/05/2016)   Chronic diastolic heart failure ([place]) 04/17/2017   Constipation 05/14/2019   Coronary artery disease    Coronary artery disease involving native coronary artery of native heart with unstable angina pectoris ([place])    Coronary artery disease involving native coronary artery with angina pectoris ([place]) 04/08/2017   Fecal impaction ([place]) 05/14/2019   GERD (gastroesophageal reflux disease)    Headache    "weekly" (11/05/2016)   Heart murmur    "dx'd when I was a child"   Hepatitis 1991   "when I had my gallbladder out" (11/05/2016)   History of blood transfusion 1985   S/P childbirth   History of kidney stones    History of peptic ulcer 05/14/2019   HLD (hyperlipidemia) 11/27/2018   HTN (hypertension) 05/14/2019   Hypokalemia 04/17/2017   Iron deficiency anemia 03/07/2019   Irritable bowel syndrome 05/14/2019   Kidney stones    LBBB (left bundle branch block) 11/05/2016   NSTEMI (non-ST elevated myocardial infarction) ([place]) 11/05/2016   9/18 PCI/DESx1 to p/mLAD   Orthostatic hypotension    Pancreatitis 1991   Pericardial effusion 04/08/2017   Pneumonia    "twice" (11/05/2016)   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   Seizures ([place])    Severe mitral regurgitation 05/14/2019   Syncope 05/14/2019   Therapeutic opioid induced constipation 05/14/2019   Unstable angina ([place]) 03/04/2019    Past Surgical History:  Procedure Laterality Date   ABDOMINAL HYSTERECTOMY  1990   ANKLE FRACTURE SURGERY  2021   left   APPENDECTOMY  1992   BACK SURGERY     BILE DUCT EXPLORATION  1991   "reconstruction"   [place] WITH STENT PLACEMENT  11/05/2016   CORONARY ARTERY BYPASS GRAFT N/A 03/26/2017   Procedure: CORONARY ARTERY BYPASS GRAFTING (CABG) x 3 WITH ENDOSCOPIC HARVESTING OF RIGHT SAPHENOUS VEIN;  Surgeon: Ivin Poot, MD;  Location: [facility];  Service: Open Heart Surgery;  Laterality: N/A;   CORONARY STENT INTERVENTION N/A  11/05/2016   Procedure: CORONARY STENT INTERVENTION;  Surgeon: Martinique, Peter M, MD;  Location: [place] [facility];  Service: Cardiovascular;  Laterality: N/A;   CYSTOSCOPY WITH STENT PLACEMENT  "3 times" (11/05/2016)   DILATION AND CURETTAGE OF UTERUS  1989   ESOPHAGOGASTRODUODENOSCOPY (EGD) WITH PROPOFOL N/A 03/25/2020   Procedure: ESOPHAGOGASTRODUODENOSCOPY (EGD) WITH PROPOFOL;  Surgeon: Yetta Flock, MD;  Location: [place];  Service: Gastroenterology;  Laterality: N/A;   HEMOSTASIS CLIP PLACEMENT  03/25/2020   Procedure: HEMOSTASIS CLIP PLACEMENT;  Surgeon: Yetta Flock, MD;  Location: [place];  Service: Gastroenterology;;   INTRAVASCULAR ULTRASOUND/IVUS N/A 03/22/2017   Procedure: Intravascular Ultrasound/IVUS;  Surgeon: Martinique, Peter M, MD;  Location: [place] [facility];  Service: Cardiovascular;  Laterality: N/A;   KNEE ARTHROSCOPY Right 2000s   LEFT HEART CATH AND CORONARY ANGIOGRAPHY N/A 11/05/2016   Procedure: LEFT HEART CATH AND CORONARY ANGIOGRAPHY;  Surgeon: Martinique, Peter M, MD;  Location: [place] [facility];  Service: Cardiovascular;  Laterality: N/A;  LEFT HEART CATH AND CORONARY ANGIOGRAPHY N/A 03/22/2017   Procedure: LEFT HEART CATH AND CORONARY ANGIOGRAPHY;  Surgeon: Martinique, Peter M, MD;  Location: [place] [facility];  Service: Cardiovascular;  Laterality: N/A;   LEFT HEART CATH AND CORS/GRAFTS ANGIOGRAPHY N/A 03/05/2019   Procedure: LEFT HEART CATH AND CORS/GRAFTS ANGIOGRAPHY;  Surgeon: Leonie Man, MD;  Location: [place] [facility];  Service: Cardiovascular;  Laterality: N/A;   NASAL SINUS SURGERY  ~ 2009   "exposed to mildew; had to have the infectoin drained; cut the outside of my face"   OVARIAN CYST SURGERY  X 6   RIGHT/LEFT HEART CATH AND CORONARY/GRAFT ANGIOGRAPHY N/A 11/24/2018   Procedure: RIGHT/LEFT HEART CATH AND CORONARY/GRAFT ANGIOGRAPHY;  Surgeon: Leonie Man, MD;  Location: [place] [facility];  Service: Cardiovascular;   Laterality: N/A;   TEE WITHOUT CARDIOVERSION N/A 03/26/2017   Procedure: TRANSESOPHAGEAL ECHOCARDIOGRAM (TEE);  Surgeon: Prescott Gum, [name], MD;  Location: [facility];  Service: Open Heart Surgery;  Laterality: N/A;   UPPER GASTROINTESTINAL ENDOSCOPY      Current Outpatient Medications  Medication Sig Dispense Refill   albuterol (VENTOLIN HFA) 108 (90 Base) MCG/ACT inhaler Inhale 2 puffs into the lungs every 6 (six) hours as needed for wheezing or shortness of breath.     AMITIZA 24 MCG capsule TAKE 1 CAPSULE BY MOUTH TWICE DAILY WITH A MEAL 60 capsule 0   aspirin EC 81 MG tablet Take 81 mg by mouth daily.     atorvastatin (LIPITOR) 80 MG tablet Take 1 tablet (80 mg total) by mouth daily at 6 PM. (Patient taking differently: Take 80 mg by mouth at bedtime.) 30 tablet 6   busPIRone (BUSPAR) 15 MG tablet Take 7.5 mg by mouth 3 (three) times daily.     clopidogrel (PLAVIX) 75 MG tablet Take 75 mg by mouth daily.     fluocinonide (LIDEX) 0.05 % external solution Apply 1 application topically 2 (two) times daily.     ketoconazole (NIZORAL) 2 % shampoo Apply 1 application topically 2 (two) times a week.     midodrine (PROAMATINE) 10 MG tablet Take 10 mg by mouth 3 (three) times daily.     Multiple Vitamin (MULTIVITAMIN WITH MINERALS) TABS tablet Take 1 tablet by mouth daily.     nitroGLYCERIN (NITROSTAT) 0.4 MG SL tablet Place 0.4 mg under the tongue every 5 (five) minutes as needed for chest pain.     ofloxacin (OCUFLOX) 0.3 % ophthalmic solution INSTILL 1 DROP INTO LEFT EYE EVERY 4 HOURS ON DAY 1-2, THEN 1 DROP 4 TIMES DAILY FOR DAYS 3-7.     potassium chloride SA (KLOR-CON) 20 MEQ tablet Take 20 mEq by mouth daily as needed (with each dose of furosemide).     ranolazine (RANEXA) 1000 MG SR tablet Take 1 tablet (1,000 mg total) by mouth 2 (two) times daily. 180 tablet 2   rOPINIRole (REQUIP) 0.5 MG tablet Take 0.5 mg by mouth at bedtime.     sertraline (ZOLOFT) 50 MG tablet Take 50 mg by mouth daily.      trazodone (DESYREL) 300 MG tablet Take 300 mg by mouth at bedtime.     triamcinolone ointment (KENALOG) 0.1 % Apply 1 application topically 2 (two) times daily.     furosemide (LASIX) 40 MG tablet Take 1 tablet (40 mg total) by mouth 3 (three) times a week. 45 tablet 3   metoprolol tartrate (LOPRESSOR) 25 MG tablet Take 1 tablet (25 mg total) by mouth 2 (  two) times daily. 180 tablet 3   pantoprazole (PROTONIX) 40 MG tablet Take 1 tablet (40 mg total) by mouth 2 (two) times daily. 60 tablet 2   No current facility-administered medications for this visit.    Allergies as of 07/27/2020 - Review Complete 07/27/2020  Allergen Reaction Noted   Amiodarone Diarrhea and Nausea And Vomiting 04/17/2017   Colchicine Diarrhea and Nausea And Vomiting 04/17/2017   Compazine [prochlorperazine edisylate] Other (See Comments) 11/05/2016   Other Other (See Comments) and Cough 11/05/2016    Family History  Problem Relation Age of Onset   Hypertension Father    Alcohol abuse Father    Heart attack Father    Diabetes Mellitus II Father    Suicidality Mother    Breast cancer Sister    Colon cancer Paternal Grandfather    Colon polyps Paternal Grandfather    Esophageal cancer Neg Hx    Rectal cancer Neg Hx    Stomach cancer Neg Hx      Physical Exam: General:   Alert,  well-nourished, pleasant and cooperative in NAD Head:  Normocephalic and atraumatic. Eyes:  Sclera clear, no icterus.   Conjunctiva pink. Abdomen:  Soft, nontender, nondistended, normal bowel sounds, no rebound or guarding. No hepatosplenomegaly.   Neurologic:  Alert and  oriented x4;  grossly nonfocal Skin:  Intact without significant lesions or rashes. Psych:  Alert and cooperative. Normal mood and affect.     Zaara Sprowl L. Tarri Glenn, MD, MPH 07/27/2020, 1:41 PM

## 2020-07-27 NOTE — Patient Instructions (Addendum)
It was my pleasure to provide care to you you today. Based on our discussion, I am providing you with my recommendations below:  RECOMMENDATION(S):   I am sending you with a trial of Xifaxan. Please take 1 tablet 3 times daily for 14 days, then stop.  Please continue Pantoprazole 40mg  by mouth 2 times daily  FOLLOW UP:  I would like for you to follow up with me as needed.   BMI:  If you are age 58 or older, your body mass index should be between 23-30. Your Body mass index is 20.34 kg/m. If this is out of the aforementioned range listed, please consider follow up with your Primary Care Provider.  If you are age 58 or younger, your body mass index should be between 19-25. Your Body mass index is 20.34 kg/m. If this is out of the aformentioned range listed, please consider follow up with your Primary Care Provider.   MY CHART:  The [facility] providers would like to encourage you to use [facility] to communicate with providers for non-urgent requests or questions.  Due to long hold times on the telephone, sending your provider a message by [facility] may be a faster and more efficient way to get a response.  Please allow 48 business hours for a response.  Please remember that this is for non-urgent requests.   Thank you for trusting me with your gastrointestinal care!    Thornton Park, MD, MPH

## 2020-08-09 NOTE — Progress Notes (Signed)
Cardiology Office Note:    Date:  08/10/2020   ID:  Gabrielle Page, DOB [birth year], MRN [id number]  PCP:  Gabrielle Major, NP  Cardiologist:  Gabrielle More, MD    Referring MD: Gabrielle Major, NP    ASSESSMENT:    1. Coronary artery disease of bypass graft of native heart with stable angina pectoris ([place])   2. Chronic diastolic heart failure ([place])   3. Mixed hyperlipidemia   4. Iron deficiency anemia due to chronic blood loss    PLAN:    In order of problems listed above:  Unfortunately has done poorly with bypass surgery with graft occlusion and has done poorly with PCI attempts.  Stable at this time on intensive medical therapy including dual antiplatelet long-term lipid-lowering high intensity statin beta-blocker and ranolazine.  She is interested in an angiogenesis trial and send a copy of the note to Dr. Alethia Page at [facility] to inquire if there is still enrolling patients. Compensated continue her small loop diuretic check renal function potassium GFR Continue her statin check CMP lipid profile Recheck CBC Stable hypotension she will require support with midodrine for blood pressure   Next appointment: 6 months   Medication Adjustments/Labs and Tests Ordered: Current medicines are reviewed at length with the patient today.  Concerns regarding medicines are outlined above.  Orders Placed This Encounter  Procedures   CBC   Comprehensive metabolic panel   Lipid panel   No orders of the defined types were placed in this encounter.   Chief Complaint  Patient presents with   Follow-up   Coronary Artery Disease   Congestive Heart Failure   Anemia   Hypotension    History of Present Illness:    Gabrielle Page is a 60 y.o. female with a hx of coronary artery disease with CABG chronic diastolic heart failure chronic anemia iron deficiency hyperlipidemia idiopathic hypotension and GI hemorrhage with hospitalization [facility] February 2022  requiring transfusion. She underwent endoscopy had a esophageal ulcer with a visible vessel not actively bleeding but which was clipped and Plavix was held. She received a total of 4 units of packed blood cells .  She was last seen 04/01/2020.  Compliance with diet, lifestyle and medications: Yes  She is followed up with GI and had repeat endoscopy with healing She remains active but finds her self easily fatigued she has had no hypotensive episodes taking midodrine. At times she gets a vague mild chest discomfort but not severe and has not needed nitroglycerin. She takes her loop diuretic 3 days a week and has had no orthopnea or peripheral edema. She has had no syncope. She continues with iron supplements she has had no lab work done since my last visit Past Medical History:  Diagnosis Date   Abnormal abdominal CT scan 05/14/2019   Acute ST elevation myocardial infarction (STEMI) of inferolateral wall (HCC) 11/24/2018   Anemia 05/14/2019   Anxiety 05/14/2019   Asthma    CHF (congestive heart failure) ([place])    Chronic bronchitis ([place])    "usually get it q yr" (11/05/2016)   Chronic diastolic heart failure ([place]) 04/17/2017   Constipation 05/14/2019   Coronary artery disease    Coronary artery disease involving native coronary artery of native heart with unstable angina pectoris (HCC)    Coronary artery disease involving native coronary artery with angina pectoris ([place]) 04/08/2017   Fecal impaction ([place]) 05/14/2019   GERD (gastroesophageal reflux disease)    Headache    "weekly" (11/05/2016)  Heart murmur    "dx'd when I was a child"   Hepatitis 1991   "when I had my gallbladder out" (11/05/2016)   History of blood transfusion 1985   S/P childbirth   History of kidney stones    History of peptic ulcer 05/14/2019   HLD (hyperlipidemia) 11/27/2018   HTN (hypertension) 05/14/2019   Hypokalemia 04/17/2017   Iron deficiency anemia 03/07/2019   Irritable bowel syndrome 05/14/2019   Kidney stones    LBBB  (left bundle branch block) 11/05/2016   NSTEMI (non-ST elevated myocardial infarction) ([place]) 11/05/2016   9/18 PCI/DESx1 to p/mLAD   Orthostatic hypotension    Pancreatitis 1991   Pericardial effusion 04/08/2017   Pneumonia    "twice" (11/05/2016)   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   Seizures ([place])    Severe mitral regurgitation 05/14/2019   Syncope 05/14/2019   Therapeutic opioid induced constipation 05/14/2019   Unstable angina ([place]) 03/04/2019    Past Surgical History:  Procedure Laterality Date   ABDOMINAL HYSTERECTOMY  1990   ANKLE FRACTURE SURGERY  2021   left   APPENDECTOMY  1992   BACK SURGERY     BILE DUCT EXPLORATION  1991   "reconstruction"   [place] STENT PLACEMENT  11/05/2016   CORONARY ARTERY BYPASS GRAFT N/A 03/26/2017   Procedure: CORONARY ARTERY BYPASS GRAFTING (CABG) x 3 WITH ENDOSCOPIC HARVESTING OF RIGHT SAPHENOUS VEIN;  Surgeon: Gabrielle Poot, MD;  Location: [place];  Service: Open Heart Surgery;  Laterality: N/A;   CORONARY STENT INTERVENTION N/A 11/05/2016   Procedure: CORONARY STENT INTERVENTION;  Surgeon: Martinique, Peter M, MD;  Location: [place] [facility];  Service: Cardiovascular;  Laterality: N/A;   CYSTOSCOPY WITH STENT PLACEMENT  "3 times" (11/05/2016)   DILATION AND CURETTAGE OF UTERUS  1989   ESOPHAGOGASTRODUODENOSCOPY (EGD) WITH PROPOFOL N/A 03/25/2020   Procedure: ESOPHAGOGASTRODUODENOSCOPY (EGD) WITH PROPOFOL;  Surgeon: Gabrielle Flock, MD;  Location: [place];  Service: Gastroenterology;  Laterality: N/A;   HEMOSTASIS CLIP PLACEMENT  03/25/2020   Procedure: HEMOSTASIS CLIP PLACEMENT;  Surgeon: Gabrielle Flock, MD;  Location: [place];  Service: Gastroenterology;;   INTRAVASCULAR ULTRASOUND/IVUS N/A 03/22/2017   Procedure: Intravascular Ultrasound/IVUS;  Surgeon: Martinique, Peter M, MD;  Location: [place] [facility];  Service: Cardiovascular;  Laterality: N/A;    KNEE ARTHROSCOPY Right 2000s   LEFT HEART CATH AND CORONARY ANGIOGRAPHY N/A 11/05/2016   Procedure: LEFT HEART CATH AND CORONARY ANGIOGRAPHY;  Surgeon: Martinique, Peter M, MD;  Location: [place] [facility];  Service: Cardiovascular;  Laterality: N/A;   LEFT HEART CATH AND CORONARY ANGIOGRAPHY N/A 03/22/2017   Procedure: LEFT HEART CATH AND CORONARY ANGIOGRAPHY;  Surgeon: Martinique, Peter M, MD;  Location: [place] [facility];  Service: Cardiovascular;  Laterality: N/A;   LEFT HEART CATH AND CORS/GRAFTS ANGIOGRAPHY N/A 03/05/2019   Procedure: LEFT HEART CATH AND CORS/GRAFTS ANGIOGRAPHY;  Surgeon: Leonie Man, MD;  Location: [place] [facility];  Service: Cardiovascular;  Laterality: N/A;   NASAL SINUS SURGERY  ~ 2009   "exposed to mildew; had to have the infectoin drained; cut the outside of my face"   OVARIAN CYST SURGERY  X 6   RIGHT/LEFT HEART CATH AND CORONARY/GRAFT ANGIOGRAPHY N/A 11/24/2018   Procedure: RIGHT/LEFT HEART CATH AND CORONARY/GRAFT ANGIOGRAPHY;  Surgeon: Leonie Man, MD;  Location: [place] [facility];  Service: Cardiovascular;  Laterality:  N/A;   TEE WITHOUT CARDIOVERSION N/A 03/26/2017   Procedure: TRANSESOPHAGEAL ECHOCARDIOGRAM (TEE);  Surgeon: Prescott Gum, [name], MD;  Location: [place];  Service: Open Heart Surgery;  Laterality: N/A;   UPPER GASTROINTESTINAL ENDOSCOPY      Current Medications: Current Meds  Medication Sig   albuterol (VENTOLIN HFA) 108 (90 Base) MCG/ACT inhaler Inhale 2 puffs into the lungs every 6 (six) hours as needed for wheezing or shortness of breath.   AMITIZA 24 MCG capsule TAKE 1 CAPSULE BY MOUTH TWICE DAILY WITH A MEAL   aspirin EC 81 MG tablet Take 81 mg by mouth daily.   atorvastatin (LIPITOR) 80 MG tablet Take 1 tablet (80 mg total) by mouth daily at 6 PM. (Patient taking differently: Take 80 mg by mouth at bedtime.)   busPIRone (BUSPAR) 15 MG tablet Take 7.5 mg by mouth 3 (three) times daily.   clopidogrel (PLAVIX) 75 MG tablet Take 75 mg by  mouth daily.   fluocinonide (LIDEX) 0.05 % external solution Apply 1 application topically 2 (two) times daily.   furosemide (LASIX) 40 MG tablet Take 1 tablet (40 mg total) by mouth 3 (three) times a week.   ketoconazole (NIZORAL) 2 % shampoo Apply 1 application topically 2 (two) times a week.   metoprolol tartrate (LOPRESSOR) 25 MG tablet Take 1 tablet (25 mg total) by mouth 2 (two) times daily.   midodrine (PROAMATINE) 10 MG tablet Take 10 mg by mouth 3 (three) times daily.   Multiple Vitamin (MULTIVITAMIN WITH MINERALS) TABS tablet Take 1 tablet by mouth daily.   nitroGLYCERIN (NITROSTAT) 0.4 MG SL tablet Place 0.4 mg under the tongue every 5 (five) minutes as needed for chest pain.   pantoprazole (PROTONIX) 40 MG tablet Take 1 tablet (40 mg total) by mouth 2 (two) times daily.   potassium chloride SA (KLOR-CON) 20 MEQ tablet Take 20 mEq by mouth daily as needed (with each dose of furosemide).   ranolazine (RANEXA) 1000 MG SR tablet Take 1 tablet (1,000 mg total) by mouth 2 (two) times daily.   rOPINIRole (REQUIP) 0.5 MG tablet Take 0.5 mg by mouth at bedtime.   sertraline (ZOLOFT) 50 MG tablet Take 50 mg by mouth daily.   trazodone (DESYREL) 300 MG tablet Take 300 mg by mouth at bedtime.   triamcinolone ointment (KENALOG) 0.1 % Apply 1 application topically 2 (two) times daily.     Allergies:   Amiodarone, Colchicine, Compazine [prochlorperazine edisylate], and Other   Social History   Socioeconomic History   Marital status: Divorced    Spouse name: Not on file   Number of children: Not on file   Years of education: Not on file   Highest education level: Not on file  Occupational History   Occupation: disable  Tobacco Use   Smoking status: Never   Smokeless tobacco: Never  Vaping Use   Vaping Use: Never used  Substance and Sexual Activity   Alcohol use: Not Currently    Comment: 11/05/2016 "maybe 1 glass of wine/month"   Drug use: No   Sexual activity: Not Currently  Other  Topics Concern   Not on file  Social History Narrative   Right handed    Lives with boy friend    Social Determinants of Health   Financial Resource Strain: Not on file  Food Insecurity: Not on file  Transportation Needs: Not on file  Physical Activity: Not on file  Stress: Not on file  Social Connections: Not on file  Family History: The patient's family history includes Alcohol abuse in her father; Breast cancer in her sister; Colon cancer in her paternal grandfather; Colon polyps in her paternal grandfather; Diabetes Mellitus II in her father; Heart attack in her father; Hypertension in her father; Suicidality in her mother. There is no history of Esophageal cancer, Rectal cancer, or Stomach cancer. ROS:   Please see the history of present illness.    All other systems reviewed and are negative.  EKGs/Labs/Other Studies Reviewed:    The following studies were reviewed today:   Recent Labs: 03/26/2020: Magnesium 2.0 03/27/2020: ALT 11 03/28/2020: BUN 7; Creatinine, Ser 0.64; Potassium 3.2; Sodium 140 04/01/2020: Hemoglobin 10.1; NT-Pro BNP 1,710; Platelets 323  Recent Lipid Panel    Component Value Date/Time   CHOL 154 12/14/2019 0000   TRIG 245 (H) 12/14/2019 0000   HDL 39 (L) 12/14/2019 0000   CHOLHDL 3.9 12/14/2019 0000   CHOLHDL 4.4 03/05/2019 0452   VLDL 39 03/05/2019 0452   LDLCALC 75 12/14/2019 0000    Physical Exam:    VS:  BP (!) 92/54 (BP Location: Left Arm, Patient Position: Sitting, Cuff Size: Normal)   Pulse 60   Ht 5\' 3"  (1.6 Page)   Wt 113 lb 6.4 oz (51.4 kg)   SpO2 99%   BMI 20.09 kg/Page     Wt Readings from Last 3 Encounters:  08/10/20 113 lb 6.4 oz (51.4 kg)  07/27/20 114 lb 12.8 oz (52.1 kg)  04/01/20 127 lb (57.6 kg)     GEN: She is deeply pigmented well nourished, well developed in no acute distress HEENT: Normal NECK: No JVD; No carotid bruits LYMPHATICS: No lymphadenopathy CARDIAC: RRR, no murmurs, rubs, gallops RESPIRATORY:  Clear  to auscultation without rales, wheezing or rhonchi  ABDOMEN: Soft, non-tender, non-distended MUSCULOSKELETAL:  No edema; No deformity  SKIN: Warm and dry NEUROLOGIC:  Alert and oriented x 3 PSYCHIATRIC:  Normal affect    Signed, Gabrielle More, MD  08/10/2020 10:57 AM    [place]

## 2020-08-10 ENCOUNTER — Encounter: Payer: Self-pay | Admitting: Cardiology

## 2020-08-10 ENCOUNTER — Other Ambulatory Visit: Payer: Self-pay

## 2020-08-10 ENCOUNTER — Ambulatory Visit: Payer: Medicaid Other | Admitting: Cardiology

## 2020-08-10 VITALS — BP 92/54 | HR 60 | Ht 63.0 in | Wt 113.4 lb

## 2020-08-10 DIAGNOSIS — I5032 Chronic diastolic (congestive) heart failure: Secondary | ICD-10-CM

## 2020-08-10 DIAGNOSIS — E782 Mixed hyperlipidemia: Secondary | ICD-10-CM

## 2020-08-10 DIAGNOSIS — I25708 Atherosclerosis of coronary artery bypass graft(s), unspecified, with other forms of angina pectoris: Secondary | ICD-10-CM

## 2020-08-10 DIAGNOSIS — D500 Iron deficiency anemia secondary to blood loss (chronic): Secondary | ICD-10-CM

## 2020-08-10 NOTE — Patient Instructions (Signed)

## 2020-08-11 ENCOUNTER — Telehealth: Payer: Self-pay

## 2020-08-11 LAB — COMPREHENSIVE METABOLIC PANEL
ALT: 24 IU/L (ref 0–32)
AST: 20 IU/L (ref 0–40)
Albumin/Globulin Ratio: 2 (ref 1.2–2.2)
Albumin: 4.6 g/dL (ref 3.8–4.9)
Alkaline Phosphatase: 81 IU/L (ref 44–121)
BUN/Creatinine Ratio: 15 (ref 9–23)
BUN: 17 mg/dL (ref 6–24)
Bilirubin Total: 0.3 mg/dL (ref 0.0–1.2)
CO2: 22 mmol/L (ref 20–29)
Calcium: 9.6 mg/dL (ref 8.7–10.2)
Chloride: 101 mmol/L (ref 96–106)
Creatinine, Ser: 1.13 mg/dL — ABNORMAL HIGH (ref 0.57–1.00)
Globulin, Total: 2.3 g/dL (ref 1.5–4.5)
Glucose: 89 mg/dL (ref 65–99)
Potassium: 4 mmol/L (ref 3.5–5.2)
Sodium: 140 mmol/L (ref 134–144)
Total Protein: 6.9 g/dL (ref 6.0–8.5)
eGFR: 57 mL/min/{1.73_m2} — ABNORMAL LOW (ref >59)

## 2020-08-11 LAB — CBC
Hematocrit: 32.5 % — ABNORMAL LOW (ref 34.0–46.6)
Hemoglobin: 11.6 g/dL (ref 11.1–15.9)
MCH: 33 pg (ref 26.6–33.0)
MCHC: 35.7 g/dL (ref 31.5–35.7)
MCV: 93 fL (ref 79–97)
Platelets: 277 10*3/uL (ref 150–450)
RBC: 3.51 x10E6/uL — ABNORMAL LOW (ref 3.77–5.28)
RDW: 11.3 % — ABNORMAL LOW (ref 11.7–15.4)
WBC: 5.1 10*3/uL (ref 3.4–10.8)

## 2020-08-11 LAB — LIPID PANEL
Chol/HDL Ratio: 2.9 ratio (ref 0.0–4.4)
Cholesterol, Total: 154 mg/dL (ref 100–199)
HDL: 53 mg/dL (ref >39)
LDL Chol Calc (NIH): 78 mg/dL (ref 0–99)
Triglycerides: 128 mg/dL (ref 0–149)
VLDL Cholesterol Cal: 23 mg/dL (ref 5–40)

## 2020-08-11 LAB — CORTISOL: Cortisol: 17.6 ug/dL

## 2020-08-11 NOTE — Telephone Encounter (Signed)
Spoke with patient regarding results and recommendation.  Patient verbalizes understanding and is agreeable to plan of care. Advised patient to call back with any issues or concerns.  

## 2020-08-11 NOTE — Telephone Encounter (Signed)
-----   Message from Richardo Priest, MD sent at 08/11/2020  7:46 AM EDT ----- Normal or stable result  All are good results hemoglobin is now normal.

## 2020-08-17 ENCOUNTER — Other Ambulatory Visit: Payer: Self-pay | Admitting: Cardiology

## 2020-08-18 NOTE — Telephone Encounter (Signed)
Midodrine 10 mg tablets # 120 x 3 refill sent to [facility], [place]

## 2020-08-19 ENCOUNTER — Other Ambulatory Visit: Payer: Self-pay | Admitting: Gastroenterology

## 2020-08-22 ENCOUNTER — Telehealth: Payer: Self-pay | Admitting: Gastroenterology

## 2020-08-22 NOTE — Telephone Encounter (Signed)
Patient calling to inform she is experiencing abd pain//bloating//.constipation//. Pt wants to know how to treat sxs.. Plz advise.. Thank you

## 2020-08-22 NOTE — Telephone Encounter (Signed)
Pt states she is still having issues with constipation/diarrhea/bloating and nausea. Requesting to be seen. Pt scheduled to see Alonza Bogus PA tomorrow at 2pm. Pt aware of appt.

## 2020-08-23 ENCOUNTER — Ambulatory Visit: Payer: Medicaid Other | Admitting: Gastroenterology

## 2020-08-31 ENCOUNTER — Ambulatory Visit: Payer: Medicaid Other | Admitting: Nurse Practitioner

## 2020-10-06 ENCOUNTER — Encounter: Payer: Self-pay | Admitting: Nurse Practitioner

## 2020-10-06 ENCOUNTER — Ambulatory Visit: Payer: Medicaid Other | Admitting: Nurse Practitioner

## 2020-10-06 ENCOUNTER — Other Ambulatory Visit: Payer: Self-pay

## 2020-10-06 VITALS — BP 90/50 | HR 59 | Ht 63.0 in | Wt 112.0 lb

## 2020-10-06 DIAGNOSIS — K5900 Constipation, unspecified: Secondary | ICD-10-CM

## 2020-10-06 DIAGNOSIS — K589 Irritable bowel syndrome without diarrhea: Secondary | ICD-10-CM

## 2020-10-06 DIAGNOSIS — K219 Gastro-esophageal reflux disease without esophagitis: Secondary | ICD-10-CM | POA: Diagnosis not present

## 2020-10-06 MED ORDER — ONDANSETRON HCL 4 MG PO TABS: 4.0000 mg | ORAL_TABLET | Freq: Three times a day (TID) | ORAL | 3 refills | Status: AC | PRN

## 2020-10-06 MED ORDER — DICYCLOMINE HCL 10 MG PO CAPS: 10.0000 mg | ORAL_CAPSULE | Freq: Three times a day (TID) | ORAL | 5 refills | Status: AC | PRN

## 2020-10-06 NOTE — Patient Instructions (Addendum)
If you are age 58 or older, your body mass index should be between 23-30. Your Body mass index is 19.84 kg/m. If this is out of the aforementioned range listed, please consider follow up with your Primary Care Provider.  If you are age 58 or younger, your body mass index should be between 19-25. Your Body mass index is 19.84 kg/m. If this is out of the aformentioned range listed, please consider follow up with your Primary Care Provider.   __________________________________________________________  The [place] [facility] providers would like to encourage you to use [facility] to communicate with providers for non-urgent requests or questions.  Due to long hold times on the telephone, sending your provider a message by [facility] may be a faster and more efficient way to get a response.  Please allow 48 business hours for a response.  Please remember that this is for non-urgent requests.    You have been scheduled to have an anorectal manometry at [facility] on Wednesday, 10-26-20 at 10:30am. Please arrive 30 minutes prior to your appointment time for registration (1st floor of the hospital-admissions).  Please make certain to use 1 Fleets enema 2 hours prior to coming for your appointment. You can purchase Fleets enemas from the laxative section at your drug store. You should not eat anything during the two hours prior to the procedure. You may take regular medications with small sips of water at least 2 hours prior to the study.  Anorectal manometry is a test performed to evaluate patients with constipation or fecal incontinence. This test measures the pressures of the anal sphincter muscles, the sensation in the rectum, and the neural reflexes that are needed for normal bowel movements.  THE PROCEDURE The test takes approximately 30 minutes to 1 hour. You will be asked to change into a hospital gown. A technician or nurse will explain the procedure to you, take a brief health history, and answer any  questions you may have. The patient then lies on his or her left side. A small, flexible tube, about the size of a thermometer, with a balloon at the end is inserted into the rectum. The catheter is connected to a machine that measures the pressure. During the test, the small balloon attached to the catheter may be inflated in the rectum to assess the normal reflex pathways. The nurse or technician may also ask the person to squeeze, relax, and push at various times. The anal sphincter muscle pressures are measured during each of these maneuvers. To squeeze, the patient tightens the sphincter muscles as if trying to prevent anything from coming out. To push or bear down, the patient strains down as if trying to have a bowel movement.   We have sent the following medications to your pharmacy for you to pick up at your convenience: Zofran 4 mg: Take one by mouth every 8 hours as needed Bentyl: Take one by mouth every 8 hours as needed  You have been scheduled for a follow up appointment with Dr. Tarri Glenn on Tuesday, 11-15-20 at 8:30am.  Thank you for entrusting me with your care and for choosing [facility], Tye Savoy, NP-C

## 2020-10-06 NOTE — Progress Notes (Signed)
ASSESSMENT AND PLAN    # 60 yo female with IBS. She has chronic constipation with decreased frequency movement.  When she does have a bowel movement it is generally loose.  I think overall she is predominantly constipated . She  has associated upper abdominal cramping.  --Proceed with anorectal manometry  --Trial of low dose Bentyl once daily for cramping. Advised agaist taking more often due to its potential to constipate  # GERD, short segment Barrett's esophagus on EGD 03/2020. Having breakthrough symptoms, especially at night.  ( pyrosis). Tums help, usually takes one at night.  --Continue twice daily PPI --Continue taking a Tums at bedtime --Continue antireflux measures --I do not think repeat EGD needed at this point.    # Nausea / vomiting, unclear etiology, ? GERD related. Medication related ( on multiple meds). Weight down about 4 pounds since last visit late June 2022 but down 13 pounds since Feb 2022  --Continue BID PPI --Anti-reflux measures --Trial of Zofran as needed ( cautioned that it may worsen constipation) --Consider gastric empty study ( retained food in stomach on EGD in Feb 2022).  --No worrisome lesions on CTAP w/ contrast in February 2022.  --I have made her follow-up appointment with Dr. Tarri Glenn early October.  I will ask office nurse to contact her early next week and see if the Zofran is helping.   #Colon cancer screening.  Inadequate bowel prep at time of last colonoscopy February 2022.  Repeat colonoscopy recommended for February 2023  # Chronic diastolic heart failure  / CAD / STEMI s/p stenting in 2018 followed by CABG in 2019. On Plavix.  Per recent cardiology note she has done poorly with bypass surgery with graft occlusion and has done poorly with PCI attempts.    HISTORY OF PRESENT ILLNESS    Chief Complaint : constipation, abdominal cramping, diarrhea, nausea, vomiting, burning in chest.   Gabrielle Page is a 60 y.o. female known to Dr.  Tarri Glenn with a complicated past medical history significant for CAD/  STEMI / stenting / triple bypass in [id number], chronic diastolic heart failure, asthma, GERD with Barrett's esophagus, PUD in  2019, HTN, pancreatitis, vascular dementia, anxiety, IBS, cholecystectomy, bile duct reconstruction, appendectomy and hysterectomy. See PMH below for any additional medical problems.  January 2022 - Evaluated here for GERD, Barrett's esophagus, dysphagia, abdominal bloating, chronic constipation, blood in stools, history of gastric ulcers at [facility] in 2019.  Last colonoscopy at [facility] in 2019 with insufficient prep.   03/24/20 - EGD and colonoscopy for evaluation of above symptoms (see reports below). In short, EGD showed gastritis, a moderate size hiatal hernia .  Esophagus appeared normal, empirically dilated.  Duodenum normal.  Internal hemorrhoids, 3 mm polyp removed but not retrieved.  Stool in the entire colon, repeat colonoscopy recommended in 1 year.   03/25/20 -hospitalized after above endoscopic procedures for nausea, vomiting, hematemesis, acute blood loss anemia , upper abdominal pain. Abdominal xray negative for perforation. Repeat EGD remarkable for esophageal ulcers, one with stigmata of recent bleeding with visible vessel. Clip was placed. A large amount of food (residue) in the stomach prohibited views of the stomach and small bowel. Procedure terminated due to food in stomach  07/27/20 - office visit.  Evaluated for multiple GI issues including bloating, abdominal discomfort, dysphagia, chronic constipation, colon cancer screening. She was prescribed Xifaxan (or Septra if insurance did not pay for Xifaxan ) for possible SIBO. For constipation she was continued on Amitiza. She is intolerant  of Linzess secondary to severe bloating.  Prune juice nor magnesium helped in the past. Plan was for annorectal manometry if no improvement.   She was due for colon cancer screening which was scheduled.  Again her colon  prep was inadequate.  A tiny polyp was removed but not retrieved hemorrhoids were found.    INTERVAL HISTORY:  Here with multiple GI issues  including persistent constipation, abdominal cramping, diarrhea, nausea, vomiting, burning in chest. Taking Amitiza BID but does not feel it is effective.  She seldom has the urge to have a bowel movement and when she does it comes out loose. For GERD symptoms she is taking twice daily Protonix.  She has minimal caffeine intake, goes to bed on an empty stomach and has a head of her bed propped.     PREVIOUS ENDOSCOPIC EVALUATIONS / PERTINENT STUDIES:   February 2022 Colonoscopy  Preparation of the colon was inadequate. - Hemorrhoids found on perianal exam. - Non-bleeding internal hemorrhoids. - One 3 mm polyp in the transverse colon, removed with a cold snare. Complete resection but polyp not retrieved.  Polyp tissue not retrieved. - Stool in the entire examined colon. - The examination was otherwise normal on direct and retroflexion views.  Due to inadequate bowel prep a repeat colonoscopy was recommended in 1 year  03/24/20 EGD --Z-line irregular, 34 cm from the incisors. Biopsied. - Normal appearing esophageal lumen. Empiric dilation performed. - Gastritis. Biopsied. - Medium-sized hiatal hernia. - Normal examined duodenum. Biopsied. Diagnosis 1. Surgical [P], gastric - GASTRIC ANTRAL MUCOSA WITH REACTIVE GASTROPATHY. - GASTRIC OXYNTIC MUCOSA WITH MILD CHRONIC GASTRITIS. - WARTHIN-STARRY STAIN IS NEGATIVE FOR HELICOBACTER PYLORI. 2. Surgical [P], distal esophagus - INTESTINAL METAPLASIA (GOBLET CELL METAPLASIA) CONSISTENT WITH BARRETT'S ESOPHAGUS. - NEGATIVE FOR DYSPLASIA. 3. Surgical [P], mid and proximal esophagus - SQUAMOUS ESOPHAGEAL EPITHELIUM WITH NO SIGNIFICANT PATHOLOGIC FINDINGS. - NEGATIVE FOR INCREASED INTRAEPITHELIAL EOSINOPHILS.   03/25/20 EGD for upper GI post EGD the day prior.   3 cm hiatal hernia with recent GEJ  biopsies noted, no evidence of bleeding there. - Esophageal ulcers, one with stigmata of recent bleeding with visible vessel. Clip was placed. - A large amount of food (residue) in the stomach which prohibited views of the stomach and small bowel. Given the burden of food in the stomach, procedure was terminated given obvious pathology in the stomach without fresh blood noted elsewhere or active bleeding.. Esophageal ulcers may be secondary to recent biopsy sites vs. balloon dilation trauma (?). Patient denies any report of pill impaction / esophagitis symptoms, etc, since the procedure to have caused this.  Past Medical History:  Diagnosis Date   Abnormal abdominal CT scan 05/14/2019   Acute ST elevation myocardial infarction (STEMI) of inferolateral wall (HCC) 11/24/2018   Anemia 05/14/2019   Anxiety 05/14/2019   Asthma    CHF (congestive heart failure) ([place])    Chronic bronchitis ([place])    "usually get it q yr" (11/05/2016)   Chronic diastolic heart failure ([place]) 04/17/2017   Constipation 05/14/2019   Coronary artery disease    Coronary artery disease involving native coronary artery of native heart with unstable angina pectoris ([place])    Coronary artery disease involving native coronary artery with angina pectoris ([place]) 04/08/2017   Fecal impaction ([place]) 05/14/2019   GERD (gastroesophageal reflux disease)    Headache    "weekly" (11/05/2016)   Heart murmur    "dx'd when I was a child"   Hepatitis 1991   "when I had  my gallbladder out" (11/05/2016)   History of blood transfusion 1985   S/P childbirth   History of kidney stones    History of peptic ulcer 05/14/2019   HLD (hyperlipidemia) 11/27/2018   HTN (hypertension) 05/14/2019   Hypokalemia 04/17/2017   Iron deficiency anemia 03/07/2019   Irritable bowel syndrome 05/14/2019   Kidney stones    LBBB (left bundle branch block) 11/05/2016   NSTEMI (non-ST elevated myocardial infarction) ([place]) 11/05/2016   9/18 PCI/DESx1 to p/mLAD   Orthostatic  hypotension    Pancreatitis 1991   Pericardial effusion 04/08/2017   Pneumonia    "twice" (11/05/2016)   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   Current Outpatient Medications  Medication Sig Dispense Refill   albuterol (VENTOLIN HFA) 108 (90 Base) MCG/ACT inhaler Inhale 2 puffs into the lungs every 6 (six) hours as needed for wheezing or shortness of breath.     AMITIZA 24 MCG capsule TAKE 1 CAPSULE BY MOUTH TWICE DAILY WITH A MEAL 180 capsule 3   aspirin EC 81 MG tablet Take 81 mg by mouth daily.     atorvastatin (LIPITOR) 80 MG tablet Take 1 tablet (80 mg total) by mouth daily at 6 PM. (Patient taking differently: Take 80 mg by mouth at bedtime.) 30 tablet 6   busPIRone (BUSPAR) 15 MG tablet Take 7.5 mg by mouth 3 (three) times daily.     clopidogrel (PLAVIX) 75 MG tablet Take 75 mg by mouth daily.     fluocinonide (LIDEX) 0.05 % external solution Apply 1 application topically 2 (two) times daily.     furosemide (LASIX) 40 MG tablet Take 1 tablet (40 mg total) by mouth 3 (three) times a week. 45 tablet 3   ketoconazole (NIZORAL) 2 % shampoo Apply 1 application topically 2 (two) times a week.     metoprolol tartrate (LOPRESSOR) 25 MG tablet Take 1 tablet (25 mg total) by mouth 2 (two) times daily. 180 tablet 3   midodrine (PROAMATINE) 10 MG tablet TAKE 1 TABLET BY MOUTH IN THE MORNING AND 1 AT NOON AND 1 IN THE EVENING AND 1 AT BEDTIME 120 tablet 3   Multiple Vitamin (MULTIVITAMIN WITH MINERALS) TABS tablet Take 1 tablet by mouth daily.     pantoprazole (PROTONIX) 40 MG tablet Take 1 tablet (40 mg total) by mouth 2 (two) times daily. 60 tablet 2   potassium chloride SA (KLOR-CON) 20 MEQ tablet  Take 20 mEq by mouth daily as needed (with each dose of furosemide).     ranolazine (RANEXA) 1000 MG SR tablet Take 1 tablet (1,000 mg total) by mouth 2 (two) times daily. 180 tablet 2   rOPINIRole (REQUIP) 0.5 MG tablet Take 0.5 mg by mouth at bedtime.     sertraline (ZOLOFT) 50 MG tablet Take 50 mg by mouth daily.     trazodone (DESYREL) 300 MG tablet Take 300 mg by mouth at bedtime.     triamcinolone ointment (KENALOG) 0.1 % Apply 1 application topically 2 (two) times daily.     nitroGLYCERIN (NITROSTAT) 0.4 MG SL tablet Place 0.4 mg under the tongue every 5 (five) minutes as needed for chest pain. (Patient not taking: Reported on 10/06/2020)     No  current facility-administered medications for this visit.    Review of Systems: No chest pain. No shortness of breath. No urinary complaints.   PHYSICAL EXAM :    Wt Readings from Last 3 Encounters:  10/06/20 112 lb (50.8 kg)  08/10/20 113 lb 6.4 oz (51.4 kg)  07/27/20 114 lb 12.8 oz (52.1 kg)    Ht '5\' 3"'$  (1.6 m) Comment: height measured wihtout shoes  Wt 112 lb (50.8 kg)   BMI 19.84 kg/m  Constitutional:  Pleasant female in no acute distress. Psychiatric: Normal mood and affect. Behavior is normal. EENT: Pupils normal.  Conjunctivae are normal. No scleral icterus. Neck supple.  Cardiovascular: Normal rate, regular rhythm. No edema Pulmonary/chest: Effort normal and breath sounds normal. No wheezing, rales or rhonchi. Abdominal: Soft, nondistended, nontender. Bowel sounds active throughout. There are no masses palpable. No hepatomegaly. Neurological: Alert and oriented to person place and time. Skin: Skin is warm and dry. No rashes noted.  Tye Savoy, NP  10/06/2020, 8:51 AM        '

## 2020-10-07 ENCOUNTER — Encounter: Payer: Self-pay | Admitting: Nurse Practitioner

## 2020-10-11 NOTE — Progress Notes (Signed)
Reviewed and agree with management plans. ? ?Vlada Uriostegui L. Bryam Taborda, MD, MPH  ?

## 2020-10-26 ENCOUNTER — Encounter (HOSPITAL_COMMUNITY)
Admission: RE | Disposition: A | Discharge status: Home / Self Care | Payer: Self-pay | Source: Ambulatory Visit | Attending: Gastroenterology

## 2020-10-26 ENCOUNTER — Ambulatory Visit (HOSPITAL_COMMUNITY)
Admission: RE | Admit: 2020-10-26 | Discharge: 2020-10-26 | Disposition: A | Discharge status: Home / Self Care | Payer: Medicaid Other | Source: Ambulatory Visit | Attending: Gastroenterology | Admitting: Gastroenterology

## 2020-10-26 DIAGNOSIS — K5900 Constipation, unspecified: Secondary | ICD-10-CM | POA: Diagnosis present

## 2020-10-26 DIAGNOSIS — K5904 Chronic idiopathic constipation: Secondary | ICD-10-CM

## 2020-10-26 DIAGNOSIS — K5902 Outlet dysfunction constipation: Secondary | ICD-10-CM

## 2020-10-26 HISTORY — PX: ANAL RECTAL MANOMETRY: SHX6358

## 2020-10-26 SURGERY — MANOMETRY, ANORECTAL

## 2020-10-26 NOTE — Progress Notes (Signed)
Anal manometry done per protocol. Pt tolerated well without distress or complication  D. Davis endo tech in room during procedure.

## 2020-10-27 ENCOUNTER — Encounter (HOSPITAL_COMMUNITY): Payer: Self-pay | Admitting: Gastroenterology

## 2020-11-03 DIAGNOSIS — K5902 Outlet dysfunction constipation: Secondary | ICD-10-CM

## 2020-11-09 ENCOUNTER — Other Ambulatory Visit: Payer: Self-pay

## 2020-11-09 DIAGNOSIS — M6289 Other specified disorders of muscle: Secondary | ICD-10-CM

## 2020-11-15 ENCOUNTER — Ambulatory Visit: Payer: Medicaid Other | Admitting: Gastroenterology

## 2020-12-01 ENCOUNTER — Other Ambulatory Visit: Payer: Self-pay

## 2020-12-01 ENCOUNTER — Encounter: Payer: Self-pay | Admitting: Neurology

## 2020-12-01 ENCOUNTER — Ambulatory Visit: Payer: Medicaid Other | Attending: Gastroenterology | Admitting: Physical Therapy

## 2020-12-01 ENCOUNTER — Encounter: Payer: Self-pay | Admitting: Physical Therapy

## 2020-12-01 ENCOUNTER — Ambulatory Visit: Payer: Medicaid Other | Admitting: Neurology

## 2020-12-01 VITALS — BP 107/51 | HR 65 | Ht 63.0 in | Wt 114.4 lb

## 2020-12-01 DIAGNOSIS — M25532 Pain in left wrist: Secondary | ICD-10-CM

## 2020-12-01 DIAGNOSIS — M6281 Muscle weakness (generalized): Secondary | ICD-10-CM | POA: Diagnosis present

## 2020-12-01 DIAGNOSIS — R29898 Other symptoms and signs involving the musculoskeletal system: Secondary | ICD-10-CM | POA: Diagnosis not present

## 2020-12-01 DIAGNOSIS — R252 Cramp and spasm: Secondary | ICD-10-CM | POA: Diagnosis not present

## 2020-12-01 DIAGNOSIS — Z9181 History of falling: Secondary | ICD-10-CM

## 2020-12-01 NOTE — Patient Instructions (Signed)
About Constipation  Constipation Overview Constipation is the most common gastrointestinal complaint -- about 4 million Americans experience constipation and make 2.5 million physician visits a year to get help for the problem.  Constipation can occur when the colon absorbs too much water, the colon's muscle contraction is slow or sluggish, and/or there is delayed transit time through the colon.  The result is stool that is hard and dry.  Indicators of constipation include straining during bowel movements greater than 25% of the time, having fewer than three bowel movements per week, and/or the feeling of incomplete evacuation.  There are established guidelines ([place] ) for defining constipation. A person needs to have two or more of the following symptoms for at least 12 weeks (not necessarily consecutive) in the preceding 12 months: Straining in  greater than 25% of bowel movements Lumpy or hard stools in greater than 25% of bowel movements Sensation of incomplete emptying in greater than 25% of bowel movements Sensation of anorectal obstruction/blockade in greater than 25% of bowel movements Manual maneuvers to help empty greater than 25% of bowel movements (e.g., digital evacuation, support of the pelvic floor)  Less than  3 bowel movements/week Loose stools are not present, and criteria for irritable bowel syndrome are insufficient Common Causes of Constipation lack of fiber in your diet lack of physical activity medications, including iron and calcium supplements  dairy intake dehydration abuse of laxatives travel Irritable Bowel Syndrome pregnancy luteal phase of menstruation (after ovulation and before menses) colorectal problems intestinal dysfunction  Treating Constipation  There are several ways of treating constipation, including changes to diet and exercise, use of laxatives, adjustments to the pelvic floor, and scheduled toileting.  These treatments include: increasing  fiber and fluids in the diet  increasing physical activity learning muscle coordination  learning proper toileting techniques and toileting modifications  designing and sticking  to a toileting schedule   A high fiber diet with plenty of fluids (up to 8 glasses of water daily) is suggested to relieve these symptoms.  Metamucil, 1 tablespoon once or twice daily can be used to keep bowels regular if needed.  Fiber Table -- Grams of Fiber in Food  For additional information on fiber content in foods, go to www.caloriecounts.com  Food Products Serving Size Grams of Fiber/serving  Breads    Whole Wheat 1 slice 7.98  White 1 slice 0.5  Rye 1 slice 9.21  Cereals    Oat Bran 1 oz. 4.06  Wheat Bran 1 oz. 10.0  All Bran  cup 6.0  Optimum 1 cup 10.0  Whole Wheat Total 1 cup 3.0  Fiber One   cup 13.0  Shredded Wheat 1oz. 2.64  Corn Flakes 1 oz. 0.45  Cheerio's 1 1/3 cup 2.0  Oatmeal 1 oz. 2.5  Rice    Brown  cup 5.27  White  cup 1.42  Spaghetti 2 oz. 2.56  Vegetables (cooked)    Broccoli  cup 2.58  Brussels sprouts  cup 2.0  Cauliflower  cup 2.6  Carrots  cup 3.2  Corn  cup 3.03  Eggplant  cup 0.96  Green peas  cup 3.36  Lettuce (raw)  cup 0.24  Baked potato w/skin  cup 2.97  Spinach  cup 2.07  Squash  cup 2.87  Tomato (raw)  cup 1.17  Zucchini  cup 1.26  Beans    Green (canned)  cup 1.89  Kidney  cup 5.48  Lima  cup 4.25  Pinto  cup 5.93  Fresh  fruits    Apple (with peel) 1 medium 2.76  Apricots 1 cup 3.13  Banana  1 medium 2.19  Black/Boysenberries 1 cup 7.2  Grapefruit 1 medium 3.61  Grapes 1 cup 1.12  Nectarine 1 medium 2.2  Orange 1 medium 3.14  Pear (with peel) 1 medium 4.32  Prunes 3 3.5  Raspberries 1 cup 7.5  Strawberries 1 cup 9.89  Watermelon 1 slice 2.11    Bladder Irritants  Certain foods and beverages can be irritating to the bladder.  Avoiding these irritants may decrease your symptoms of urinary urgency, frequency or  bladder pain.  Even reducing your intake can help with your symptoms.  Not everyone is sensitive to all bladder irritants, so you may consider focusing on one irritant at a time, removing or reducing your intake of that irritant for 7-10 days to see if this change helps your symptoms.  Water intake is also very important.  Below is a list of bladder irritants.  Drinks: alcohol, carbonated beverages, caffeinated beverages such as coffee and tea, drinks with artificial sweeteners, citrus juices, apple juice, tomato juice  Foods: tomatoes and tomato based foods, spicy food, sugar and artificial sweeteners, vinegar, chocolate, raw onion, apples, citrus fruits, pineapple, cranberries, tomatoes, strawberries, plums, peaches, cantaloupe  Other: acidic urine (too concentrated) - see water intake info below  Substitutes you can try that are NOT irritating to the bladder: cooked onion, pears, papayas, sun-brewed decaf teas, watermelons, non-citrus herbal teas, apricots, kava and low-acid instant drinks (Postum).    WATER INTAKE: Remember to drink lots of water (aim for fluid intake of half your body weight with 2/3 of fluids being water).  You may be limiting fluids due to fear of leakage, but this can actually worsen urgency symptoms due to highly concentrated urine.  Water helps balance the pH of your urine so it doesn't become too acidic - acidic urine is a bladder irritant!

## 2020-12-01 NOTE — Therapy (Signed)
[facility] @ [place], [place], [address] Phone: [phone]   Fax:  [phone]  Physical Therapy Evaluation  Patient Details  Name: Gabrielle Page MRN: [id number] Date of Birth: 1960/05/18 Referring Provider (PT): Thornton Park, MD   Encounter Date: 12/01/2020   PT End of Session - 12/01/20 1257     Visit Number 1    Date for PT Re-Evaluation 04/03/21    Authorization Type [employer]    PT Start Time 7741    PT Stop Time 1250    PT Time Calculation (min) 49 min    Activity Tolerance Patient tolerated treatment well;No increased pain    Behavior During Therapy [facility] for tasks assessed/performed             Past Medical History:  Diagnosis Date   Abnormal abdominal CT scan 58/02/2019   Acute ST elevation myocardial infarction (STEMI) of inferolateral wall (HCC) 58/01/2019   Anemia 05/14/2019   Anxiety 05/14/2019   Asthma    CHF (congestive heart failure) ([place])    Chronic bronchitis ([place])    "usually get it q yr" (11/05/2016)   Chronic diastolic heart failure ([place]) 04/17/2017   Constipation 05/14/2019   Coronary artery disease    Coronary artery disease involving native coronary artery of native heart with unstable angina pectoris ([place])    Coronary artery disease involving native coronary artery with angina pectoris ([place]) 04/08/2017   Fecal impaction ([place]) 05/14/2019   GERD (gastroesophageal reflux disease)    Headache    "weekly" (11/05/2016)   Heart murmur    "dx'd when I was a child"   Hepatitis 1991   "when I had my gallbladder out" (11/05/2016)   History of blood transfusion 60   S/P childbirth 60   History of kidney stones    History of peptic ulcer 05/14/2019   HLD (hyperlipidemia) 58/15/2020   HTN (hypertension) 58/02/2019   Hypokalemia 04/17/2017   Iron deficiency anemia 58/23/2021   Irritable bowel syndrome 05/14/2019   Kidney stones    LBBB (left bundle branch block) 11/05/2016   NSTEMI  (non-ST elevated myocardial infarction) ([place]) 11/05/2016   9/18 PCI/DESx1 to p/mLAD   Orthostatic hypotension    Pancreatitis 60   Pericardial effusion 04/08/2017   Pneumonia    "twice" (11/05/2016)   S/P CABG x 3 58/01/2018   Seizures ([place])    Severe mitral regurgitation 58/02/2019   Syncope 05/14/2019   Therapeutic opioid induced constipation 05/14/2019   Unstable angina ([place]) 58/20/2021    Past Surgical History:  Procedure Laterality Date   ABDOMINAL HYSTERECTOMY  60   ANAL RECTAL MANOMETRY N/A 58/14/2022   Procedure: ANO RECTAL MANOMETRY;  Surgeon: Thornton Park, MD;  Location: [facility];  Service: Gastroenterology;  Laterality: N/A;   ANKLE FRACTURE SURGERY  60   left   APPENDECTOMY  1992   BACK SURGERY     BILE DUCT EXPLORATION  1991   "reconstruction"   [place]   COLONOSCOPY     CORONARY ANGIOPLASTY WITH STENT PLACEMENT  11/05/2016   CORONARY ARTERY BYPASS GRAFT N/A 03/26/2017   Procedure: CORONARY ARTERY BYPASS GRAFTING (CABG) x 3 WITH ENDOSCOPIC HARVESTING OF RIGHT SAPHENOUS VEIN;  Surgeon: Ivin Poot, MD;  Location: [facility];  Service: Open Heart Surgery;  Laterality: N/A;   CORONARY STENT INTERVENTION N/A 11/05/2016   Procedure: CORONARY STENT INTERVENTION;  Surgeon: Martinique, Peter M, MD;  Location: [facility];  Service: Cardiovascular;  Laterality: N/A;   CYSTOSCOPY WITH STENT PLACEMENT  "3 times" (11/05/2016)   DILATION AND CURETTAGE OF UTERUS  60   ESOPHAGOGASTRODUODENOSCOPY (EGD) WITH PROPOFOL N/A 58/12/2020   Procedure: ESOPHAGOGASTRODUODENOSCOPY (EGD) WITH PROPOFOL;  Surgeon: Yetta Flock, MD;  Location: [facility];  Service: Gastroenterology;  Laterality: N/A;   HEMOSTASIS CLIP PLACEMENT  58/12/2020   Procedure: HEMOSTASIS CLIP PLACEMENT;  Surgeon: Yetta Flock, MD;  Location: [facility];  Service: Gastroenterology;;   INTRAVASCULAR ULTRASOUND/IVUS N/A 58/09/2017   Procedure:  Intravascular Ultrasound/IVUS;  Surgeon: Martinique, Peter M, MD;  Location: [facility];  Service: Cardiovascular;  Laterality: N/A;   KNEE ARTHROSCOPY Right 2000s   LEFT HEART CATH AND CORONARY ANGIOGRAPHY N/A 11/05/2016   Procedure: LEFT HEART CATH AND CORONARY ANGIOGRAPHY;  Surgeon: Martinique, Peter M, MD;  Location: [facility];  Service: Cardiovascular;  Laterality: N/A;   LEFT HEART CATH AND CORONARY ANGIOGRAPHY N/A 03/22/2017   Procedure: LEFT HEART CATH AND CORONARY ANGIOGRAPHY;  Surgeon: Martinique, Peter M, MD;  Location: [place] [facility];  Service: Cardiovascular;  Laterality: N/A;   LEFT HEART CATH AND CORS/GRAFTS ANGIOGRAPHY N/A 03/05/2019   Procedure: LEFT HEART CATH AND CORS/GRAFTS ANGIOGRAPHY;  Surgeon: Leonie Man, MD;  Location: [place] [facility];  Service: Cardiovascular;  Laterality: N/A;   NASAL SINUS SURGERY  ~ 2009   "exposed to mildew; had to have the infectoin drained; cut the outside of my face"   OVARIAN CYST SURGERY  X 6   RIGHT/LEFT HEART CATH AND CORONARY/GRAFT ANGIOGRAPHY N/A 11/24/2018   Procedure: RIGHT/LEFT HEART CATH AND CORONARY/GRAFT ANGIOGRAPHY;  Surgeon: Leonie Man, MD;  Location: [facility];  Service: Cardiovascular;  Laterality: N/A;   TEE WITHOUT CARDIOVERSION N/A 03/26/2017   Procedure: TRANSESOPHAGEAL ECHOCARDIOGRAM (TEE);  Surgeon: Prescott Gum, [name], MD;  Location: [facility];  Service: Open Heart Surgery;  Laterality: N/A;   UPPER GASTROINTESTINAL ENDOSCOPY      There were no vitals filed for this visit.    Subjective Assessment - 12/01/20 1210     Subjective Pt reports chronic history of constipation and then loose stools for years and now worsening. Pt reports BMs every 4 days and then 2 days of loose stools at least 3-4x times a day. Pt will have fecal incontinence with those 2 days and wears depends at night due to leakage. Pt reports she will also have leakage with passing gas. Pt will have lower abdominal pain/cramping  with this and then has low back pain. Bristol Stool scale: starts with 4 then progresses into 7.    Pertinent History CAD/  STEMI / stenting / triple bypass in 4401, chronic diastolic heart failure, asthma, GERD with Barrett's esophagus, gastric ulcers, PUD in  2019, HTN, pancreatitis, vascular dementia, anxiety, IBS, cholecystectomy, bile duct reconstruction, appendectomy and hysterectomy,  per 8/25 [facility] note she has done poorly with bypass surgery with graft occlusion and has done poorly with PCI attempts.    How long can you sit comfortably? no limits    How long can you stand comfortably? no limits    How long can you walk comfortably? no limits    Patient Stated Goals to have less leakage and more regular BM    Currently in Pain? Yes    Pain Score 1     Pain Location Abdomen    Pain Orientation Lower    Pain Type Chronic pain    Aggravating Factors  mobility sometimes, vacuuming makes back pain worse    Pain Relieving Factors resting                [facility] PT Assessment - 12/01/20 0001       Assessment   Medical Diagnosis M62.89 (ICD-10-CM) - Pelvic floor dysfunction    Referring Provider (PT) Thornton Park, MD    Prior Therapy no for PFPT      Precautions   Precautions None      Restrictions   Weight Bearing Restrictions No      Balance Screen   Has the patient fallen in the past 6 months No    Has the patient had a decrease in activity level because of a fear of falling?  No    Is the patient reluctant to leave their home because of a fear of falling?  No      Home Ecologist residence    Living Arrangements Spouse/significant other      Prior Function   Level of Independence Independent    Vocation On disability      Cognition   Overall Cognitive Status Within Functional Limits for tasks assessed      Sensation   Light Touch Appears Intact      Coordination   Gross Motor Movements are Fluid and Coordinated Yes    Fine  Motor Movements are Fluid and Coordinated Yes      Posture/Postural Control   Posture/Postural Control No significant limitations      ROM / Strength   AROM / PROM / Strength AROM;Strength      AROM   Overall AROM Comments flexion, side bending, rotation limited by 25% in thoracic and lumbar spine      Strength   Overall Strength Comments 3+/5 bil hips grossly      Flexibility   Soft Tissue Assessment /Muscle Length yes   bil adductors and hamstrings limited by 25%     Palpation   Palpation comment TTP throughout abdomen and multiple tissue/scar restrictions noted in abdomen.                        Objective measurements completed on examination: See above findings.     Pelvic Floor Special Questions - 12/01/20 0001     Are you Pregnant or attempting pregnancy? No    Prior Pregnancies Yes    Number of Pregnancies 2    Number of C-Sections 1    Any difficulty with labor and deliveries Yes   emergency c-section and other child adopted.   Currently Sexually Active Yes    Is this Painful No    History of sexually transmitted disease No    Marinoff Scale no problems    Urinary Leakage Yes    How often once per week with increased loose stools    Pad use depends at night when having loose stools, not for urine    Activities that cause leaking With strong urge;Other;Sneezing    Other activities that cause leaking when having loose stools will leak without any activity    Urinary urgency Yes   with full bladder   Urinary frequency ~2 hours    Fecal incontinence Yes   at least every 3-4 days after ~4 days of constipation. Sometimes will have to strain but thinks she stays tight most of the time trying to stop leakage.   Fluid intake 4 glasses of water per day  Caffeine beverages tea sometimes    Falling out feeling (prolapse) No    Pelvic Floor Internal Exam deferred                       PT Education - 12/01/20 1256     Education Details Pt  educated on constipation, fiber types, bladder irritants, abdominal massage, POC    Person(s) Educated Patient    Methods Explanation;Demonstration;Tactile cues;Verbal cues;Handout    Comprehension Verbalized understanding;Returned demonstration              PT Short Term Goals - 12/01/20 1307       PT SHORT TERM GOAL #1   Title Pt to be I with HEP    Time 6    Period Weeks    Status New    Target Date 01/12/21      PT SHORT TERM GOAL #2   Title pt to report fecal leakage decreased to no more than 1 time per week for decrease symptoms    Time 6    Period Weeks    Status New    Target Date 01/12/21      PT SHORT TERM GOAL #3   Title pt to report decreased constipation and more regular BM to at least every 2 days and stool scales types regularly between 4-5.    Time 6    Period Weeks    Status New    Target Date 01/12/21               PT Long Term Goals - 12/01/20 1312       PT LONG TERM GOAL #1   Title Pt to be I with advanced HEP    Time 4    Period Months    Status New    Target Date 04/03/21      PT LONG TERM GOAL #2   Title pt to report fecal leakage decreased to no more than 1 time per month for decrease symptoms    Time 4    Period Months    Status New    Target Date 04/03/21      PT LONG TERM GOAL #3   Title pt to report decreased constipation and more regular BM to at least every 2 days and stool scales types regularly between 3-4.    Time 4    Period Months    Status New    Target Date 04/03/21      PT LONG TERM GOAL #4   Title pt to demonstrate limited abdmoninal restrictions in all directions for improved mobility of stool.    Time 4    Period Months    Status New    Target Date 04/03/21      PT LONG TERM GOAL #5   Title pt to demonstrate at least 4/5 pelvic floor strength to decrease leakage symptoms    Time 4    Period Months    Status New    Target Date 04/03/21                    Plan - 12/01/20 1258      Clinical Impression Statement Pt is 60yo female presenting with chronic history of constipation followed by days of loose stool and worsening now to fecal incontience. Pt has a complex medical history with CAD/  STEMI / stenting / triple bypass in 0258, chronic diastolic heart failure, asthma, GERD with Barrett's esophagus, gastric ulcers, PUD  in  2019, HTN, pancreatitis, vascular dementia, anxiety, IBS, cholecystectomy, bile duct reconstruction, appendectomy and hysterectomy,  per 8/25 [facility] she has done poorly with bypass surgery with graft occlusion and has done poorly with PCI attempts. Pt also has had 2 pregnancies and one c-section birth of daughter and has adopted son. Pt reports Bristol Stool type starting with 4 then progressing to 7 and reports she will have a BM every 4 days then followed by at least 2 days of loose stools and need of nightime depends due to fecal leakage. Pt also has current history of urinary leakage with sneezing and intermittently even without activity when she is constipated. Pt found to have decreased tissue mobility and fascial restrictions throughout abdomen, TTP in abdomen in all quadrants, mild decreased hip strength and mobility, decreased spinal mobility. Pt deferred internal exam this session. Pt would benefit from PT to futher address deficits found at eval. Pt educated on diet modifications, constipation, abdominal massage and PT educated pt on how to complete massage at home with demonstration.    Personal Factors and Comorbidities Time since onset of injury/illness/exacerbation;Comorbidity 3+    Comorbidities complex medical history, c-section, multiple abdominal surgeries, and urinary and fecal leakage    Examination-Activity Limitations Continence;Toileting;Hygiene/Grooming    Examination-Participation Restrictions Community Activity;Interpersonal Relationship;Yard Work    Merchant navy officer Evolving/Moderate complexity    Clinical Decision  Making Moderate    Rehab Potential Good    PT Frequency 1x / week    PT Duration Other (comment)   16 weeks   PT Treatment/Interventions ADLs/Self Care Home Management;Functional mobility training;Therapeutic activities;Therapeutic exercise;Neuromuscular re-education;Manual techniques;Orthotic Fit/Training;Patient/family education;Scar mobilization;Taping;Passive range of motion;Energy conservation    PT Next Visit Plan go over all handouts and internal if needed    PT Home Exercise Plan handouts given for abdominal massage, bladder irritants, constipation and fiber types    Consulted and Agree with Plan of Care Patient             Patient will benefit from skilled therapeutic intervention in order to improve the following deficits and impairments:  Decreased coordination, Decreased endurance, Increased fascial restricitons, Pain, Decreased scar mobility, Impaired flexibility, Improper body mechanics, Postural dysfunction, Decreased strength, Decreased mobility  Visit Diagnosis: Cramp and spasm - Plan: PT plan of care cert/re-cert  Muscle weakness (generalized) - Plan: PT plan of care cert/re-cert     Problem List Patient Active Problem List   Diagnosis Date Noted   Dyssynergic defecation    Seizures ([place])    Kidney stones    CHF (congestive heart failure) (HCC)    Esophageal perforation    Upper GI bleed 03/25/2020   Hematemesis without nausea    Ulcer of esophagus with bleeding    Pneumonia    History of kidney stones    Heart murmur    Headache    Coronary artery disease    Chronic bronchitis (HCC)    Asthma    Abnormal abdominal CT scan 05/14/2019   Anemia 05/14/2019   Constipation 05/14/2019   Irritable bowel syndrome 05/14/2019   Therapeutic opioid induced constipation 05/14/2019   Fecal impaction ([place]) 05/14/2019   History of peptic ulcer 05/14/2019   HTN (hypertension) 05/14/2019   Severe mitral regurgitation 05/14/2019   Syncope 05/14/2019   Anxiety  05/14/2019   Iron deficiency anemia 03/07/2019   Orthostatic hypotension    Coronary artery disease involving native coronary artery of native heart with unstable angina pectoris ([place])    Unstable angina ([place]) 03/04/2019  HLD (hyperlipidemia) 58/15/2020   Acute ST elevation myocardial infarction (STEMI) of inferolateral wall (HCC) 58/01/2019   Chronic diastolic heart failure (HCC) 04/17/2017   Hypokalemia 04/17/2017   Coronary artery disease involving native coronary artery with angina pectoris ([place]) 04/08/2017   Pericardial effusion 04/08/2017   GERD (gastroesophageal reflux disease) 04/08/2017   Abdominal pain 04/08/2017   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   LBBB (left bundle branch block) 11/05/2016   NSTEMI (non-ST elevated myocardial infarction) ([place]) 11/05/2016   Pancreatitis 60   Hepatitis 1991   History of blood transfusion [place], PT, DPT 10/20/221:16 PM   [facility] @ [place], [place], [address] Phone: [phone]   Fax:  [phone]  Name: Rashunda Passon MRN: [id number] Date of Birth: October 16, 1960

## 2020-12-01 NOTE — Patient Instructions (Signed)
Good to see you!  Schedule EMG/NCV of the left upper extremity. Our office will call with results and further instructions depending on results  2. Follow-up as needed, call for any changes
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
## 2020-12-14 ENCOUNTER — Other Ambulatory Visit: Payer: Self-pay | Admitting: Cardiology

## 2020-12-19 ENCOUNTER — Telehealth: Payer: Self-pay | Admitting: Cardiology

## 2020-12-19 MED ORDER — FUROSEMIDE 40 MG PO TABS
ORAL_TABLET | ORAL | 3 refills | Status: DC
Start: 2020-12-19 — End: 2021-03-14

## 2020-12-19 NOTE — Telephone Encounter (Signed)
*  STAT* If patient is at the pharmacy, call can be transferred to refill team.   1. Which medications need to be refilled? (please list name of each medication and dose if known) Furosemide  2. Which pharmacy/location (including street and city if local pharmacy) is medication to be sent to? [facility] [address], [place]  3. Do they need a 30 day or 90 day supply? 90 days

## 2020-12-19 NOTE — Telephone Encounter (Signed)
Refill sent in per request.  

## 2020-12-22 ENCOUNTER — Ambulatory Visit: Payer: Medicaid Other | Admitting: Physical Therapy

## 2020-12-23 ENCOUNTER — Other Ambulatory Visit: Payer: Self-pay | Admitting: Cardiology

## 2020-12-29 ENCOUNTER — Ambulatory Visit: Payer: Medicaid Other | Admitting: Neurology

## 2020-12-29 ENCOUNTER — Other Ambulatory Visit: Payer: Self-pay

## 2020-12-29 DIAGNOSIS — M25532 Pain in left wrist: Secondary | ICD-10-CM | POA: Diagnosis not present

## 2020-12-29 DIAGNOSIS — Z9181 History of falling: Secondary | ICD-10-CM

## 2020-12-29 DIAGNOSIS — R29898 Other symptoms and signs involving the musculoskeletal system: Secondary | ICD-10-CM

## 2020-12-29 NOTE — Procedures (Signed)
[facility]  [place], [address]  [place], [place] [address] Tel: [phone] Fax:  [phone] Test Date:  12/29/2020  Patient: Gabrielle Page DOB: 07/25/1960 Physician: Narda Amber, DO  Sex: Female Height: 5\' 3"  Ref Phys: Ellouise Newer, M.D.  ID#: [id number]   Technician:    Patient Complaints: This is a 60 year old female referred for evaluation of left hand pain.  NCV & EMG Findings: Extensive electrodiagnostic testing of the left upper extremity shows:  Left median, ulnar, and mixed palmar sensory responses are within normal limits. Left median and ulnar motor responses are within normal limits. There is no evidence of active or chronic motor axonal loss changes affecting any of the tested muscles.  Motor unit configuration and recruitment pattern is within normal limits.  Impression: This is a normal study of the left upper extremity.  In particular, there is no evidence of a cervical radiculopathy or carpal tunnel syndrome.   ___________________________ Narda Amber, DO    Nerve Conduction Studies Anti Sensory Summary Table   Stim Site NR Peak (ms) Norm Peak (ms) P-T Amp (V) Norm P-T Amp  Left Median Anti Sensory (2nd Digit)  33C  Wrist    3.3 <3.6 53.8 >15  Left Ulnar Anti Sensory (5th Digit)  33C  Wrist    2.8 <3.1 65.4 >10   Motor Summary Table   Stim Site NR Onset (ms) Norm Onset (ms) O-P Amp (mV) Norm O-P Amp Site1 Site2 Delta-0 (ms) Dist (cm) Vel (m/s) Norm Vel (m/s)  Left Median Motor (Abd Poll Brev)  33C  Wrist    2.9 <4.0 6.8 >6 Elbow Wrist 5.2 27.0 52 >50  Elbow    8.1  6.7         Left Ulnar Motor (Abd Dig Minimi)  33C  Wrist    2.2 <3.1 8.9 >7 B Elbow Wrist 3.3 21.0 64 >50  B Elbow    5.5  8.0  A Elbow B Elbow 1.5 10.0 67 >50  A Elbow    7.0  8.0          Comparison Summary Table   Stim Site NR Peak (ms) Norm Peak (ms) P-T Amp (V) Site1 Site2 Delta-P (ms) Norm Delta (ms)  Left Median/Ulnar Palm Comparison (Wrist -  8cm)  33C  Median Palm    1.8 <2.2 98.1 Median Palm Ulnar Palm 0.1   Ulnar Palm    1.7 <2.2 42.6       EMG   Side Muscle Ins Act Fibs Psw Fasc Number Recrt Dur Dur. Amp Amp. Poly Poly. Comment  Left 1stDorInt Nml Nml Nml Nml Nml Nml Nml Nml Nml Nml Nml Nml N/A  Left PronatorTeres Nml Nml Nml Nml Nml Nml Nml Nml Nml Nml Nml Nml N/A  Left Biceps Nml Nml Nml Nml Nml Nml Nml Nml Nml Nml Nml Nml N/A  Left Triceps Nml Nml Nml Nml Nml Nml Nml Nml Nml Nml Nml Nml N/A  Left Deltoid Nml Nml Nml Nml Nml Nml Nml Nml Nml Nml Nml Nml N/A      Waveforms:

## 2020-12-30 ENCOUNTER — Telehealth: Payer: Self-pay

## 2020-12-30 NOTE — Telephone Encounter (Signed)
-----   Message from Cameron Sprang, MD sent at 12/30/2020 12:25 PM EST ----- Pls let her know the nerve and muscle test was normal, there was no indication of nerve injury causing her symptoms on left hand, meaning it is likely due to arthritis changes, f/u with PCP. thanks

## 2020-12-30 NOTE — Telephone Encounter (Signed)
Pt called and informed that the nerve and muscle test was normal, there was no indication of nerve injury causing her symptoms on left hand, meaning it is likely due to arthritis changes, f/u with PCP

## 2021-01-03 ENCOUNTER — Ambulatory Visit: Payer: Medicaid Other | Attending: Gastroenterology | Admitting: Physical Therapy

## 2021-01-03 ENCOUNTER — Telehealth: Payer: Self-pay | Admitting: Physical Therapy

## 2021-01-03 DIAGNOSIS — M6281 Muscle weakness (generalized): Secondary | ICD-10-CM | POA: Insufficient documentation

## 2021-01-03 DIAGNOSIS — R252 Cramp and spasm: Secondary | ICD-10-CM | POA: Insufficient documentation

## 2021-01-03 NOTE — Telephone Encounter (Signed)
PT called pt about this afternoon's appointment at 1445. Pt did not answer, voicemail left.   Stacy Gardner, PT, DPT 11/22/223:08 PM

## 2021-01-16 ENCOUNTER — Ambulatory Visit: Payer: Medicaid Other | Attending: Gastroenterology | Admitting: Physical Therapy

## 2021-01-16 DIAGNOSIS — R252 Cramp and spasm: Secondary | ICD-10-CM | POA: Insufficient documentation

## 2021-01-16 DIAGNOSIS — M6281 Muscle weakness (generalized): Secondary | ICD-10-CM | POA: Insufficient documentation

## 2021-01-30 ENCOUNTER — Telehealth: Payer: Self-pay | Admitting: Physical Therapy

## 2021-01-30 ENCOUNTER — Ambulatory Visit: Payer: Medicaid Other | Admitting: Physical Therapy

## 2021-01-30 NOTE — Telephone Encounter (Signed)
PT called pt about this afternoon's appointment at 1400 as pt did not arrive for appointment. Pt did not answer, voicemail left.    Stacy Gardner, PT, DPT 12/19/222:20 PM

## 2021-02-07 ENCOUNTER — Telehealth: Payer: Self-pay

## 2021-02-07 NOTE — Telephone Encounter (Signed)
APPROVAL  Medication: Lubiprostone 30mcg Insurance Company: [employer] [place] response: Available without authorization. Approval dates: N/A Misc. Notes: Hughie Closs Key: [id number] help? Call us at [phone] Outcome Additional Information Required Available without authorization. Drug Lubiprostone 24MCG capsules Form [employer] [facility] Electronic [place] Form [phone] NCPDP)

## 2021-03-13 NOTE — Progress Notes (Addendum)
Cardiology Office Note:    Date:  03/14/2021   ID:  Gabrielle Page, DOB February 07, 1961, MRN [id number]  PCP:  Jaclynn Major, NP  Cardiologist:  Shirlee More, MD    Referring MD: Jaclynn Major, NP    ASSESSMENT:    1. Coronary artery disease of bypass graft of native heart with stable angina pectoris ([place])   2. Chronic diastolic heart failure ([place])   3. Idiopathic hypotension   4. Mixed hyperlipidemia   5. Iron deficiency anemia due to chronic blood loss    PLAN:    In order of problems listed above:  Today her problem is ongoing hematuria stop clopidogrel continue aspirin resume clopidogrel 1 week if she clears if not she will need to see urology Heart failure is compensated resume her diuretic Continue midodrine no recurrent symptomatic hypotension Continue lipid-lowering therapy with atorvastatin Hemoglobin in the emergency room 10.1 she takes daily multivitamin with iron continue same Stable CAD no anginal discomfort we will drop off clopidogrel for a week continue her statin beta-blocker and ranolazine having no angina   Next appointment: 6 months   Medication Adjustments/Labs and Tests Ordered: Current medicines are reviewed at length with the patient today.  Concerns regarding medicines are outlined above.  Orders Placed This Encounter  Procedures   EKG 12-Lead   Meds ordered this encounter  Medications   furosemide (LASIX) 40 MG tablet    Sig: TAKE 1 TABLET BY MOUTH 3 TIMES A WEEK    Dispense:  45 tablet    Refill:  3    Chief Complaint  Patient presents with   Follow-up    History of Present Illness:    Gabrielle Page is a 61 y.o. female with a hx of coronary artery disease with CABG chronic diastolic heart failure chronic anemia iron deficiency hyperlipidemia idiopathic hypotension and GI hemorrhage with hospitalization [facility] February 2022 requiring transfusion. She underwent endoscopy had a esophageal ulcer with a  visible vessel not actively bleeding but which was clipped and Plavix was held. She received a total of 4 units of packed blood cells  She was last seen 08/10/2020. Compliance with diet, lifestyle and medications: Yes  She is out of her diuretic weight is up 3 or 4 pounds but is not short of breath. Seen in the emergency room 03/12/2021 with hematuria she was told she had a urine infection was put on Keflex and realized that she had bilateral nonobstructive nephrolithiasis.  She is continuing to have hematuria and we will stop her clopidogrel for the next week. Otherwise has been doing much better no recurrent hypotension tolerates her lipid-lowering without muscle pain or weakness no shortness of breath or chest pain. Past Medical History:  Diagnosis Date   Abnormal abdominal CT scan 05/14/2019   Acute ST elevation myocardial infarction (STEMI) of inferolateral wall (HCC) 11/24/2018   Anemia 05/14/2019   Anxiety 05/14/2019   Asthma    CHF (congestive heart failure) ([place])    Chronic bronchitis ([place])    "usually get it q yr" (11/05/2016)   Chronic diastolic heart failure ([place]) 04/17/2017   Constipation 05/14/2019   Coronary artery disease    Coronary artery disease involving native coronary artery of native heart with unstable angina pectoris [facility])    Coronary artery disease involving native coronary artery with angina pectoris ([place]) 04/08/2017   Fecal impaction ([place]) 05/14/2019   GERD (gastroesophageal reflux disease)    Headache    "weekly" (11/05/2016)   Heart murmur    "  dx'd when I was a child"   Hepatitis 1991   "when I had my gallbladder out" (11/05/2016)   History of blood transfusion 1985   S/P childbirth   History of kidney stones    History of peptic ulcer 05/14/2019   HLD (hyperlipidemia) 11/27/2018   HTN (hypertension) 05/14/2019   Hypokalemia 04/17/2017   Iron deficiency anemia 03/07/2019   Irritable bowel syndrome 05/14/2019   Kidney stones    LBBB (left bundle branch block) 11/05/2016    NSTEMI (non-ST elevated myocardial infarction) ([place]) 11/05/2016   9/18 PCI/DESx1 to p/mLAD   Orthostatic hypotension    Pancreatitis 1991   Pericardial effusion 04/08/2017   Pneumonia    "twice" (11/05/2016)   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   Seizures ([place])    Severe mitral regurgitation 05/14/2019   Syncope 05/14/2019   Therapeutic opioid induced constipation 05/14/2019   Unstable angina ([place]) 03/04/2019    Past Surgical History:  Procedure Laterality Date   ABDOMINAL HYSTERECTOMY  1990   ANAL RECTAL MANOMETRY N/A 10/26/2020   Procedure: ANO RECTAL MANOMETRY;  Surgeon: Thornton Park, MD;  Location: [facility];  Service: Gastroenterology;  Laterality: N/A;   ANKLE FRACTURE SURGERY  2021   left   APPENDECTOMY  1992   BACK SURGERY     BILE DUCT EXPLORATION  1991   "reconstruction"   [place]   COLONOSCOPY     CORONARY ANGIOPLASTY WITH STENT PLACEMENT  11/05/2016   CORONARY ARTERY BYPASS GRAFT N/A 03/26/2017   Procedure: CORONARY ARTERY BYPASS GRAFTING (CABG) x 3 WITH ENDOSCOPIC HARVESTING OF RIGHT SAPHENOUS VEIN;  Surgeon: Ivin Poot, MD;  Location: [place];  Service: Open Heart Surgery;  Laterality: N/A;   CORONARY STENT INTERVENTION N/A 11/05/2016   Procedure: CORONARY STENT INTERVENTION;  Surgeon: Martinique, Peter M, MD;  Location: [place] [facility];  Service: Cardiovascular;  Laterality: N/A;   CYSTOSCOPY WITH STENT PLACEMENT  "3 times" (11/05/2016)   DILATION AND CURETTAGE OF UTERUS  1989   ESOPHAGOGASTRODUODENOSCOPY (EGD) WITH PROPOFOL N/A 03/25/2020   Procedure: ESOPHAGOGASTRODUODENOSCOPY (EGD) WITH PROPOFOL;  Surgeon: Yetta Flock, MD;  Location: [place];  Service: Gastroenterology;  Laterality: N/A;   HEMOSTASIS CLIP PLACEMENT  03/25/2020   Procedure: HEMOSTASIS CLIP PLACEMENT;  Surgeon: Yetta Flock, MD;  Location: [place];  Service: Gastroenterology;;   INTRAVASCULAR ULTRASOUND/IVUS N/A 03/22/2017   Procedure:  Intravascular Ultrasound/IVUS;  Surgeon: Martinique, Peter M, MD;  Location: [place] [facility];  Service: Cardiovascular;  Laterality: N/A;   KNEE ARTHROSCOPY Right 2000s   LEFT HEART CATH AND CORONARY ANGIOGRAPHY N/A 11/05/2016   Procedure: LEFT HEART CATH AND CORONARY ANGIOGRAPHY;  Surgeon: Martinique, Peter M, MD;  Location: [place] [facility];  Service: Cardiovascular;  Laterality: N/A;   LEFT HEART CATH AND CORONARY ANGIOGRAPHY N/A 03/22/2017   Procedure: LEFT HEART CATH AND CORONARY ANGIOGRAPHY;  Surgeon: Martinique, Peter M, MD;  Location: [place] [facility];  Service: Cardiovascular;  Laterality: N/A;   LEFT HEART CATH AND CORS/GRAFTS ANGIOGRAPHY N/A 03/05/2019   Procedure: LEFT HEART CATH AND CORS/GRAFTS ANGIOGRAPHY;  Surgeon: Leonie Man, MD;  Location: [place] [facility];  Service: Cardiovascular;  Laterality: N/A;   NASAL SINUS SURGERY  ~ 2009   "exposed to mildew; had to have the infectoin drained; cut the outside of my face"   OVARIAN CYST SURGERY  X 6   RIGHT/LEFT HEART CATH AND CORONARY/GRAFT ANGIOGRAPHY N/A 11/24/2018   Procedure:  RIGHT/LEFT HEART CATH AND CORONARY/GRAFT ANGIOGRAPHY;  Surgeon: Leonie Man, MD;  Location: [place] [facility];  Service: Cardiovascular;  Laterality: N/A;   TEE WITHOUT CARDIOVERSION N/A 03/26/2017   Procedure: TRANSESOPHAGEAL ECHOCARDIOGRAM (TEE);  Surgeon: Prescott Gum, [name], MD;  Location: [place];  Service: Open Heart Surgery;  Laterality: N/A;   UPPER GASTROINTESTINAL ENDOSCOPY      Current Medications: Current Meds  Medication Sig   albuterol (VENTOLIN HFA) 108 (90 Base) MCG/ACT inhaler Inhale 2 puffs into the lungs every 6 (six) hours as needed for wheezing or shortness of breath.   AMITIZA 24 MCG capsule TAKE 1 CAPSULE BY MOUTH TWICE DAILY WITH A MEAL   aspirin EC 81 MG tablet Take 81 mg by mouth daily.   atorvastatin (LIPITOR) 80 MG tablet Take 1 tablet (80 mg total) by mouth daily at 6 PM.   busPIRone (BUSPAR) 15 MG tablet Take 15 mg by mouth  3 (three) times daily.   cephALEXin (KEFLEX) 250 MG capsule Take 250 mg by mouth every 8 (eight) hours.   dicyclomine (BENTYL) 10 MG capsule Take 1 capsule (10 mg total) by mouth every 8 (eight) hours as needed for spasms.   fluocinonide (LIDEX) 0.05 % external solution Apply 1 application topically 2 (two) times daily.   ketoconazole (NIZORAL) 2 % shampoo Apply 1 application topically 2 (two) times a week.   metoprolol tartrate (LOPRESSOR) 25 MG tablet Take 1 tablet (25 mg total) by mouth 2 (two) times daily.   midodrine (PROAMATINE) 10 MG tablet TAKE 1 TABLET BY MOUTH IN THE MORNING AND 1 AT NOON AND 1 IN THE EVENING AND 1 AT BEDTIME   Multiple Vitamin (MULTIVITAMIN WITH MINERALS) TABS tablet Take 1 tablet by mouth daily.   nitroGLYCERIN (NITROSTAT) 0.4 MG SL tablet Place 0.4 mg under the tongue every 5 (five) minutes as needed for chest pain.   ondansetron (ZOFRAN) 4 MG tablet Take 1 tablet (4 mg total) by mouth every 8 (eight) hours as needed for nausea or vomiting.   pantoprazole (PROTONIX) 40 MG tablet Take 1 tablet (40 mg total) by mouth 2 (two) times daily.   potassium chloride SA (KLOR-CON) 20 MEQ tablet Take 20 mEq by mouth daily as needed (with each dose of furosemide).   ranolazine (RANEXA) 1000 MG SR tablet TAKE 1  BY MOUTH TWICE DAILY   rOPINIRole (REQUIP) 0.5 MG tablet Take 0.5 mg by mouth at bedtime.   sertraline (ZOLOFT) 50 MG tablet Take 50 mg by mouth daily.   trazodone (DESYREL) 300 MG tablet Take 300 mg by mouth at bedtime.   triamcinolone ointment (KENALOG) 0.1 % Apply 1 application topically 2 (two) times daily.   [DISCONTINUED] clopidogrel (PLAVIX) 75 MG tablet Take 75 mg by mouth daily.   [DISCONTINUED] furosemide (LASIX) 40 MG tablet TAKE 1 TABLET BY MOUTH 3 TIMES A WEEK     Allergies:   Amiodarone, Colchicine, Compazine [prochlorperazine edisylate], and Other   Social History   Socioeconomic History   Marital status: Divorced    Spouse name: Not on file   Number  of children: Not on file   Years of education: Not on file   Highest education level: Not on file  Occupational History   Occupation: disable  Tobacco Use   Smoking status: Never   Smokeless tobacco: Never  Vaping Use   Vaping Use: Never used  Substance and Sexual Activity   Alcohol use: Yes    Comment: 11/05/2016 "maybe 1 glass of wine/month"  Drug use: No   Sexual activity: Not Currently  Other Topics Concern   Not on file  Social History Narrative   Right handed    Lives with boy friend    Social Determinants of Health   Financial Resource Strain: Not on file  Food Insecurity: Not on file  Transportation Needs: Not on file  Physical Activity: Not on file  Stress: Not on file  Social Connections: Not on file     Family History: The patient's family history includes Alcohol abuse in her father; Breast cancer in her sister; Colon cancer in her paternal grandfather; Colon polyps in her paternal grandfather; Diabetes Mellitus II in her father; Heart attack in her father; Hypertension in her father; Suicidality in her mother. There is no history of Esophageal cancer, Rectal cancer, or Stomach cancer. ROS:   Please see the history of present illness.    All other systems reviewed and are negative.  EKGs/Labs/Other Studies Reviewed:    The following studies were reviewed today:  EKG today sinus rhythm left bundle branch block  Recent Labs: 03/26/2020: Magnesium 2.0 04/01/2020: NT-Pro BNP 1,710 08/10/2020: ALT 24; BUN 17; Creatinine, Ser 1.13; Hemoglobin 11.6; Platelets 277; Potassium 4.0; Sodium 140  Recent Lipid Panel    Component Value Date/Time   CHOL 154 08/10/2020 1108   TRIG 128 08/10/2020 1108   HDL 53 08/10/2020 1108   CHOLHDL 2.9 08/10/2020 1108   CHOLHDL 4.4 03/05/2019 0452   VLDL 39 03/05/2019 0452   LDLCALC 78 08/10/2020 1108    Physical Exam:    VS:  BP 130/68 (BP Location: Left Arm, Patient Position: Sitting, Cuff Size: Normal)    Pulse (!) 59    Ht  5\' 3"  (1.6 m)    Wt 123 lb (55.8 kg)    SpO2 99%    BMI 21.79 kg/m     Wt Readings from Last 3 Encounters:  03/14/21 123 lb (55.8 kg)  12/01/20 114 lb 6.4 oz (51.9 kg)  10/06/20 112 lb (50.8 kg)     GEN:  Well nourished, well developed in no acute distress HEENT: Normal NECK: No JVD; No carotid bruits LYMPHATICS: No lymphadenopathy CARDIAC: RRR, no murmurs, rubs, gallops RESPIRATORY:  Clear to auscultation without rales, wheezing or rhonchi  ABDOMEN: Soft, non-tender, non-distended MUSCULOSKELETAL:  No edema; No deformity  SKIN: Warm and dry NEUROLOGIC:  Alert and oriented x 3 PSYCHIATRIC:  Normal affect    Signed, Shirlee More, MD  03/14/2021 2:54 PM    [facility]

## 2021-03-14 ENCOUNTER — Encounter: Payer: Self-pay | Admitting: Cardiology

## 2021-03-14 ENCOUNTER — Ambulatory Visit: Payer: Medicaid Other | Admitting: Cardiology

## 2021-03-14 ENCOUNTER — Other Ambulatory Visit: Payer: Self-pay

## 2021-03-14 VITALS — BP 130/68 | HR 59 | Ht 63.0 in | Wt 123.0 lb

## 2021-03-14 DIAGNOSIS — D500 Iron deficiency anemia secondary to blood loss (chronic): Secondary | ICD-10-CM

## 2021-03-14 DIAGNOSIS — I25708 Atherosclerosis of coronary artery bypass graft(s), unspecified, with other forms of angina pectoris: Secondary | ICD-10-CM

## 2021-03-14 DIAGNOSIS — I950 Idiopathic hypotension: Secondary | ICD-10-CM

## 2021-03-14 DIAGNOSIS — I5032 Chronic diastolic (congestive) heart failure: Secondary | ICD-10-CM | POA: Diagnosis not present

## 2021-03-14 DIAGNOSIS — E782 Mixed hyperlipidemia: Secondary | ICD-10-CM

## 2021-03-14 MED ORDER — FUROSEMIDE 40 MG PO TABS: ORAL_TABLET | ORAL | 3 refills | Status: AC

## 2021-03-14 NOTE — Patient Instructions (Signed)
Medication Instructions:  Your physician has recommended you make the following change in your medication:  STOP: Plavix in one week.  *If you need a refill on your cardiac medications before your next appointment, please call your pharmacy*   Lab Work: None If you have labs (blood work) drawn today and your tests are completely normal, you will receive your results only by: [place] (if you have MyChart) OR A paper copy in the mail If you have any lab test that is abnormal or we need to change your treatment, we will call you to review the results.   Testing/Procedures: None   Follow-Up: At [facility], you and your health needs are our priority.  As part of our continuing mission to provide you with exceptional heart care, we have created designated Provider Care Teams.  These Care Teams include your primary Cardiologist (physician) and Advanced Practice Providers (APPs -  Physician Assistants and Nurse Practitioners) who all work together to provide you with the care you need, when you need it.  We recommend signing up for the patient portal called "MyChart".  Sign up information is provided on this After Visit Summary.  MyChart is used to connect with patients for Virtual Visits (Telemedicine).  Patients are able to view lab/test results, encounter notes, upcoming appointments, etc.  Non-urgent messages can be sent to your provider as well.   To learn more about what you can do with MyChart, go to [email or web address].    Your next appointment:   6 month(s)  The format for your next appointment:   In Person  Provider:   Shirlee More, MD    Other Instructions

## 2021-04-19 ENCOUNTER — Encounter: Payer: Self-pay | Admitting: Gastroenterology

## 2021-04-27 ENCOUNTER — Encounter: Payer: Self-pay | Admitting: Gastroenterology

## 2021-05-04 ENCOUNTER — Other Ambulatory Visit: Payer: Self-pay | Admitting: Cardiology

## 2021-05-08 ENCOUNTER — Telehealth: Payer: Self-pay | Admitting: Gastroenterology

## 2021-05-08 NOTE — Telephone Encounter (Signed)
Inbound call from patient stating that she has an appointment for a colon procedure on 5/1 with Dr. Tarri Glenn. Patient stated that she thinks that she needs an EGD as well due to having a hard time swallowing. Patient is seeking advice if she can be scheduled for both. Please advise.  ?

## 2021-05-10 ENCOUNTER — Ambulatory Visit: Payer: Medicaid Other | Admitting: Physician Assistant

## 2021-05-10 ENCOUNTER — Telehealth: Payer: Self-pay

## 2021-05-10 ENCOUNTER — Encounter: Payer: Self-pay | Admitting: Physician Assistant

## 2021-05-10 VITALS — BP 104/60 | HR 56 | Ht 63.0 in | Wt 119.0 lb

## 2021-05-10 DIAGNOSIS — Z86010 Personal history of colonic polyps: Secondary | ICD-10-CM

## 2021-05-10 DIAGNOSIS — R1310 Dysphagia, unspecified: Secondary | ICD-10-CM

## 2021-05-10 DIAGNOSIS — R1084 Generalized abdominal pain: Secondary | ICD-10-CM

## 2021-05-10 DIAGNOSIS — Z860101 Personal history of adenomatous and serrated colon polyps: Secondary | ICD-10-CM

## 2021-05-10 DIAGNOSIS — K219 Gastro-esophageal reflux disease without esophagitis: Secondary | ICD-10-CM | POA: Diagnosis not present

## 2021-05-10 DIAGNOSIS — I2510 Atherosclerotic heart disease of native coronary artery without angina pectoris: Secondary | ICD-10-CM

## 2021-05-10 DIAGNOSIS — Z7901 Long term (current) use of anticoagulants: Secondary | ICD-10-CM

## 2021-05-10 MED ORDER — PLENVU 140 G PO SOLR: 1.0000 | Freq: Once | ORAL | 0 refills | Status: AC

## 2021-05-10 MED ORDER — FAMOTIDINE 20 MG PO TABS: 20.0000 mg | ORAL_TABLET | Freq: Two times a day (BID) | ORAL | 3 refills | Status: DC

## 2021-05-10 NOTE — Telephone Encounter (Signed)
Dr. Bettina Gavia to review.  Patient had CAD s/p CABG in February 2019.  Since her bypass surgery, she has underwent cardiac catheterization in October 2020 due to inferolateral infarct secondary to your occluded OM.  Unsuccessful attempt was made to open up the OM1.  Last cardiac catheterization was performed in January 2021 at which time patient has known occluded SVG-diagonal and SVG-OM, widely patent RCA, patent LIMA to LAD, 80% ostial LAD followed by a 90% LAD lesion after a previously placed stent.  Medical therapy was recommended.  Patient was recently seen by Dr. Bettina Gavia in January 2023 at which time she was having some hematuria.  Patient was instructed to stop Plavix for 1 week before restarting. ? ?She still has intermittent hematuria however she attributed to a kidney stone which still has not passed.  She has dyspnea on exertion related to her large hiatal hernia but no recent chest discomfort. ? ?Dr. Bettina Gavia, would you be okay with the patient hold Plavix for 5 days prior to GI procedure?  Patient previously underwent EGD in February 2022 due to upper GI bleed ? ?Please forward your response to [facility] PREOP ?

## 2021-05-10 NOTE — Telephone Encounter (Signed)
? ?  Name: Gabrielle Page  ?DOB: 1960/10/01  ?MRN: [id number]  ? ?Primary Cardiologist: Shirlee More, MD ? ?Chart reviewed as part of pre-operative protocol coverage. Patient was contacted 05/10/2021 in reference to pre-operative risk assessment for pending surgery as outlined below.  Teegan Guinther was last seen on 03/14/2021 by Dr. Bettina Gavia.  Since that day, Desi Carby has done well without exertional chest pain or worsening shortness of breath. ? ?Therefore, based on ACC/AHA guidelines, the patient would be at acceptable risk for the planned procedure without further cardiovascular testing.  ? ?Patient may hold Plavix for 5 days prior to the procedure and restart as soon as possible afterward at the surgeon's discretion. ? ?The patient was advised that if she develops new symptoms prior to surgery to contact our office to arrange for a follow-up visit, and she verbalized understanding. ? ?I will route this recommendation to the requesting party via Epic fax function and remove from pre-op pool. Please call with questions. ? ?Almyra Deforest, [place] ?05/10/2021, 7:07 PM ? ?

## 2021-05-10 NOTE — Progress Notes (Signed)
? ?Chief Complaint: Dysphagia ? ?HPI: ?   Gabrielle Page is a 61 year old female with a past medical history as listed below including CHF, CAD on Plavix and multiple others listed below, known to Dr. Tarri Glenn, who presents to clinic today with complaint of dysphagia. ?   03/05/2019 left heart cath with LVEF 55-65%.  80% stenosed LAD lesion with stenosis of stent. ?   03/24/2020 colonoscopy with poor preparation of the colon, nonbleeding internal hemorrhoids and one 3 mm polyp in the transverse colon, repeat recommended a year given poor prep. ?   03/24/2020 EGD with Z-line irregular 34 cm from the incisors, normal-appearing esophageal lumen with empiric dilation, gastritis, medium sized hiatal hernia and normal duodenum.  ?   03/25/2020 CT of the chest abdomen pelvis with contrast showed no CT findings for esophageal perforation, mild distal esophageal wall thickening but no mass or intramural gas, stable surgical changes from coronary artery bypass surgery, stable age advanced coronary artery calcifications and LAD stent, status postcholecystectomy and sphincterotomy with pneumobilia, left renal calculus and remote T12/L2 compression fractures. ?   03/25/2020 repeat EGD at the hospital given hematemesis.  Patient admitted with a hemoglobin of 6 and multiple episodes of hematemesis.  EGD showed 3 cm hiatal hernia with recent GJ biopsies, esophageal ulcers with 1 stigmata of recent bleeding with visible vessel, large amount of food in the stomach which prohibited views of the stomach and small bowel, is thought esophageal ulcers may be secondary to recent biopsy sites versus balloon dilation trauma. ?   03/14/2021 patient followed with cardiology and at that time was doing fairly well from their viewpoint on Plavix.  She was having some hematuria and they held her Plavix for a week and told her to see nephrology. ?   Today, the patient tells me that after her last EGD with dilation a year ago she did feel some relief for  about a month but then symptoms started to come back intermittently and over the past few months she has had trouble anytime that she eats anything.  Tells me that it hurts and feels like it gets hung up and has to pass through a tight spot in her esophagus.  This is when she eats anything solid and not soft.  She does fine with liquids.  Also complains of increasing reflux symptoms over the past few months regardless of Pantoprazole 40 mg twice a day.  Tells me she is nervous to change any medications.  She is asking for repeat EGD. ?   Does ask me about prep for colonoscopy given that she was "running clear" at home but Dr. Tarri Glenn could not see anything. ?   Also describes generalized abdominal pain which she thinks is from previous abdominal surgeries and scar tissue.  Tells me it is tender to even light palpation "all over". ?   Discusses her hematuria, apparently this stopped after 9 days and is due to 3 kidney stones which they "cannot operate on".  She has restarted her Plavix. ?   Denies fever, chills or weight loss. ?    ?Previous GI history: ?Dysphagia/choking with solids ?- Similar symptoms have improved with empiric dilation in the past ?- EGD with +/- dilation +/- biopsies to evaluate for reflux, infectious esophagitis, and EOE ?- Consider MBBS in the future given potential for oral pharyngeal component to her ?dysphagia Per Dr. Tarri Glenn ?Bloating and abdominal discomfort ?- Etiology unclear. Prior CT at [facility] in 2019 showed no cause. ?Barrett's Esophagus on  EGD 2019 at [facility] ?- EGD with surveillance biopsies recommended ?- Continue daily proton pump inhibitor ?Gastric ulcer bleed requiring hospitalization at [facility] 2019 ?- Likely due to NSAIDs as gastric biopsies were negative for H. Pylori ?- Repeat EGD recommended giving ongoing abdominal discomfort and bloating ?Past Medical History:  ?Diagnosis Date  ? Abnormal abdominal CT scan 05/14/2019  ? Acute ST elevation myocardial infarction (STEMI)  of inferolateral wall ([place]) 11/24/2018  ? Anemia 05/14/2019  ? Anxiety 05/14/2019  ? Asthma   ? CHF (congestive heart failure) ([place])   ? Chronic bronchitis ([place])   ? "usually get it q yr" (11/05/2016)  ? Chronic diastolic heart failure ([place]) 04/17/2017  ? Constipation 05/14/2019  ? Coronary artery disease   ? Coronary artery disease involving native coronary artery of native heart with unstable angina pectoris ([place])   ? Coronary artery disease involving native coronary artery with angina pectoris ([place]) 04/08/2017  ? Fecal impaction ([place]) 05/14/2019  ? GERD (gastroesophageal reflux disease)   ? Headache   ? "weekly" (11/05/2016)  ? Heart murmur   ? "dx'd when I was a child"  ? Hepatitis 1991  ? "when I had my gallbladder out" (11/05/2016)  ? History of blood transfusion 1985  ? S/P childbirth  ? History of kidney stones   ? History of peptic ulcer 05/14/2019  ? HLD (hyperlipidemia) 11/27/2018  ? HTN (hypertension) 05/14/2019  ? Hypokalemia 04/17/2017  ? Iron deficiency anemia 03/07/2019  ? Irritable bowel syndrome 05/14/2019  ? Kidney stones   ? LBBB (left bundle branch block) 11/05/2016  ? NSTEMI (non-ST elevated myocardial infarction) ([place]) 11/05/2016  ? 9/18 PCI/DESx1 to p/mLAD  ? Orthostatic hypotension   ? Pancreatitis 1991  ? Pericardial effusion 04/08/2017  ? Pneumonia   ? "twice" (11/05/2016)  ? 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 Seizures ([place])   ? Severe mitral regurgitation 05/14/2019  ? Syncope 05/14/2019  ? Therapeutic opioid induced constipation 05/14/2019  ? Unstable angina ([place]) 03/04/2019  ? ? ?Past Surgical History:  ?Procedure Laterality Date  ? ABDOMINAL HYSTERECTOMY  1990  ? ANAL RECTAL MANOMETRY N/A 10/26/2020  ? Procedure: ANO RECTAL MANOMETRY;  Surgeon: Thornton Park, MD;  Location: [facility];  Service: Gastroenterology;  Laterality: N/A;  ? ANKLE FRACTURE SURGERY  2021  ? left  ? APPENDECTOMY  1992  ? BACK SURGERY    ? BILE DUCT EXPLORATION  1991  ? "reconstruction"  ? [place]  ? CHOLECYSTECTOMY OPEN  1991  ?  COLONOSCOPY    ? CORONARY ANGIOPLASTY WITH STENT PLACEMENT  11/05/2016  ? CORONARY ARTERY BYPASS GRAFT N/A 03/26/2017  ? Procedure: CORONARY ARTERY BYPASS GRAFTING (CABG) x 3 WITH ENDOSCOPIC HARVESTING OF RIGHT SAPHENOUS VEIN;  Surgeon: Ivin Poot, MD;  Location: [facility];  Service: Open Heart Surgery;  Laterality: N/A;  ? CORONARY STENT INTERVENTION N/A 11/05/2016  ? Procedure: CORONARY STENT INTERVENTION;  Surgeon: Martinique, Peter M, MD;  Location: [facility];  Service: Cardiovascular;  Laterality: N/A;  ? CYSTOSCOPY WITH STENT PLACEMENT  "3 times" (11/05/2016)  ? [place] OF UTERUS  1989  ? ESOPHAGOGASTRODUODENOSCOPY (EGD) WITH PROPOFOL N/A 03/25/2020  ? Procedure: ESOPHAGOGASTRODUODENOSCOPY (EGD) WITH PROPOFOL;  Surgeon: Yetta Flock, MD;  Location: [facility];  Service: Gastroenterology;  Laterality: N/A;  ? HEMOSTASIS CLIP PLACEMENT  03/25/2020  ? Procedure: HEMOSTASIS CLIP PLACEMENT;  Surgeon: Yetta Flock, MD;  Location: [facility];  Service: Gastroenterology;;  ? INTRAVASCULAR ULTRASOUND/IVUS N/A 03/22/2017  ?  Procedure: Intravascular Ultrasound/IVUS;  Surgeon: Martinique, Peter M, MD;  Location: [place] [facility];  Service: Cardiovascular;  Laterality: N/A;  ? KNEE ARTHROSCOPY Right 2000s  ? LEFT HEART CATH AND CORONARY ANGIOGRAPHY N/A 11/05/2016  ? Procedure: LEFT HEART CATH AND CORONARY ANGIOGRAPHY;  Surgeon: Martinique, Peter M, MD;  Location: [facility];  Service: Cardiovascular;  Laterality: N/A;  ? LEFT HEART CATH AND CORONARY ANGIOGRAPHY N/A 03/22/2017  ? Procedure: LEFT HEART CATH AND CORONARY ANGIOGRAPHY;  Surgeon: Martinique, Peter M, MD;  Location: [facility];  Service: Cardiovascular;  Laterality: N/A;  ? LEFT HEART CATH AND CORS/GRAFTS ANGIOGRAPHY N/A 03/05/2019  ? Procedure: LEFT HEART CATH AND CORS/GRAFTS ANGIOGRAPHY;  Surgeon: Leonie Man, MD;  Location: [facility];  Service: Cardiovascular;  Laterality: N/A;  ? NASAL SINUS SURGERY   ~ 2009  ? "exposed to mildew; had to have the infectoin drained; cut the outside of my face"  ? OVARIAN CYST SURGERY  X 6  ? RIGHT/LEFT HEART CATH AND CORONARY/GRAFT ANGIOGRAPHY N/A 11/24/2018  ? Proced

## 2021-05-10 NOTE — Progress Notes (Signed)
Reviewed and agree with management plans. ? ?Saundra Gin L. Denisse [place], MD, MPH  ?

## 2021-05-10 NOTE — Patient Instructions (Signed)
We have sent the following medications to your pharmacy for you to pick up at your convenience: famotidine 20 mg one tablet by mouth twice daily.  ? ?You have been scheduled for a colonoscopy. Please follow written instructions given to you at your visit today.  ?Please pick up your prep supplies at the pharmacy within the next 1-3 days. ?If you use inhalers (even only as needed), please bring them with you on the day of your procedure. ? ?You have been scheduled for a Barium Esophogram at [facility] (1st floor of the hospital) on 05/19/21 at 10:30am. Please arrive 15 minutes prior to your appointment for registration. Make certain not to have anything to eat or drink 3 hours prior to your test. If you need to reschedule for any reason, please contact radiology at [phone] to do so. ?___________________________________________________________ ?A barium swallow is an examination that concentrates on views of the esophagus. This tends to be a double contrast exam (barium and two liquids which, when combined, create a gas to distend the wall of the oesophagus) or single contrast (non-ionic iodine based). The study is usually tailored to your symptoms so a good history is essential. Attention is paid during the study to the form, structure and configuration of the esophagus, looking for functional disorders (such as aspiration, dysphagia, achalasia, motility and reflux) ?EXAMINATION ?You may be asked to change into a gown, depending on the type of swallow being performed. A radiologist and radiographer will perform the procedure. The radiologist will advise you of the ?type of contrast selected for your procedure and direct you during the exam. You will be asked to stand, sit or lie in several different positions and to hold a small amount of fluid in your mouth before being asked to swallow while the imaging is performed .In some instances you may be asked to swallow barium coated marshmallows to assess  the motility of a solid food bolus. ?The exam can be recorded as a digital or video fluoroscopy procedure. ?POST PROCEDURE ?It will take 1-2 days for the barium to pass through your system. To facilitate this, it is important, unless otherwise directed, to increase your fluids for the next 24-48hrs and to resume your normal diet.  ?This test typically takes about 30 minutes to perform. ?___________________________________________________________ ? ?The [facility] providers would like to encourage you to use [facility] to communicate with providers for non-urgent requests or questions.  Due to long hold times on the telephone, sending your provider a message by [facility] may be a faster and more efficient way to get a response.  Please allow 48 business hours for a response.  Please remember that this is for non-urgent requests.  ? ?Due to recent changes in healthcare laws, you may see the results of your imaging and laboratory studies on MyChart before your provider has had a chance to review them.  We understand that in some cases there may be results that are confusing or concerning to you. Not all laboratory results come back in the same time frame and the provider may be waiting for multiple results in order to interpret others.  Please give [place] 48 hours in order for your provider to thoroughly review all the results before contacting the office for clarification of your results.  ? ? ?

## 2021-05-10 NOTE — Telephone Encounter (Signed)
[facility] Pre-operative Risk Assessment  ?   ?Request for surgical clearance:     Endoscopy Procedure ? ?What type of surgery is being performed?     Colonoscopy ? ?When is this surgery scheduled?     06/12/21 ? ?What type of clearance is required ?   Pharmacy and Cardiac clearance  ? ?Are there any medications that need to be held prior to surgery and how long? Plavix x 5 days ? ?Practice name and name of physician performing surgery?      [facility] ? ?What is your office phone and fax number?      Phone- [phone]  Fax- [phone] ? ?Anesthesia type (None, local, MAC, general) ?       [facility]  ?

## 2021-05-11 NOTE — Telephone Encounter (Signed)
Informed patient per Cardiology she can hold Plavix 5 days before her procedure. Patient verbalized understanding. ?

## 2021-05-11 NOTE — Telephone Encounter (Signed)
LVM for patient to call back. ?

## 2021-05-19 ENCOUNTER — Ambulatory Visit (HOSPITAL_COMMUNITY)
Admission: RE | Admit: 2021-05-19 | Discharge: 2021-05-19 | Disposition: A | Discharge status: Home / Self Care | Payer: Medicaid Other | Source: Ambulatory Visit | Attending: Physician Assistant | Admitting: Physician Assistant

## 2021-05-19 DIAGNOSIS — R1084 Generalized abdominal pain: Secondary | ICD-10-CM | POA: Diagnosis present

## 2021-05-19 DIAGNOSIS — K219 Gastro-esophageal reflux disease without esophagitis: Secondary | ICD-10-CM | POA: Diagnosis present

## 2021-05-19 DIAGNOSIS — R1310 Dysphagia, unspecified: Secondary | ICD-10-CM | POA: Insufficient documentation

## 2021-05-24 ENCOUNTER — Telehealth: Payer: Self-pay | Admitting: Physician Assistant

## 2021-05-24 NOTE — Telephone Encounter (Signed)
Called and spoke with patient. She is aware that Anderson Malta is out of the office this week and has not had a chance to review her results. Pt is aware that we will contact her next week. Pt verbalized understanding and had no concerns at the end of the call. ?

## 2021-05-24 NOTE — Telephone Encounter (Signed)
Inbound call from patient inquiring of tedt results from Barium Swallow  ?

## 2021-05-29 ENCOUNTER — Telehealth: Payer: Self-pay

## 2021-05-29 NOTE — Telephone Encounter (Signed)
-----   Message from Levin Erp, [place] sent at 05/29/2021 10:20 AM EDT ----- ?Regarding: Referral to ENT ?Dr. Tarri Glenn recommends referral to ENT given location of web on her esophagram.  Please call and let the patient know. ? ?Thanks, JL L ?----- Message ----- ?From: Thornton Park, MD ?Sent: 05/29/2021  10:19 AM EDT ?To: Levin Erp, PA ? ?I would recommend ENT given the location of the web on her esophagram. Could plan EGD if symptoms don't improve after that. ? ?Thanks. ? ?KLB ?----- Message ----- ?From: Levin Erp, PA ?Sent: 05/29/2021  10:05 AM EDT ?To: Thornton Park, MD ? ?Patient scheduled for colonoscopy with you on 5/1.  This is her recent esophagram.  She would likely benefit from an EGD as well.  Are you able to add this on for her or do we need to change around her appointment? ? ?Thanks, JLL ? ? ?

## 2021-05-29 NOTE — Telephone Encounter (Signed)
Called and spoke with patient regarding her esophogram results and recommendations. Pt is aware that I will place referral to ENT and they will contact her directly to set up her appt. Pt wanted to make sure her celiac labs were received. Results are in epic. I told pt that we will have you all review and we will call her back with recommendations. Pt verbalized understanding and had no concerns at the end of the call. ? ?Referral, records, pt's demographic and insurance information have been faxed to [facility] ([phone], F: [phone]) ?

## 2021-05-30 NOTE — Telephone Encounter (Signed)
Labs reviewed and negative for celiac. Thanks-JLL  ? ?Called and spoke with patient regarding lab results. Pt states that she is confused because her PCP told her that she has celiac disease. I again told pt what Anderson Malta stated. Pt verbalized understanding and had no concerns at the end of the call. ?

## 2021-05-31 NOTE — Telephone Encounter (Signed)
Inbound call from patient stating that she was wanting to add EGD to her colon and was told she needed to come in for an OV first before it could be added and stated she did not understand why since she wasn't seen long ago. Patient is seeking advice on what to do. Please advise.  ?

## 2021-05-31 NOTE — Telephone Encounter (Signed)
Returned call to patient. I reviewed recommendations with patient again. She states that she is just sick on her stomach, I asked her to elaborate and she stated that she was nauseous and vomited 5 times yesterday. Pt states that it happens randomly. I asked her if she still has her RX for Zofran and she said that she does but only uses it as needed. I told her to take the Zofran on a scheduled basis for a couple of days until her symptoms improve and then she can go back to as needed.  Pt has been advised to go to the ED for evaluation if she is not able to tolerate P.O. Pt states that she has not heard from ENT yet, I told pt that the referral was placed 2 days ago and it will take some time for them to review her records. Pt verbalized understanding and had no concerns at the end of the call. ?

## 2021-06-05 ENCOUNTER — Encounter: Payer: Self-pay | Admitting: Gastroenterology

## 2021-06-12 ENCOUNTER — Encounter: Payer: Self-pay | Admitting: Gastroenterology

## 2021-06-12 ENCOUNTER — Ambulatory Visit (AMBULATORY_SURGERY_CENTER): Payer: Medicaid Other | Admitting: Gastroenterology

## 2021-06-12 VITALS — BP 95/56 | HR 53 | Temp 97.8°F | Resp 11 | Ht 63.0 in | Wt 119.0 lb

## 2021-06-12 DIAGNOSIS — Z86010 Personal history of colonic polyps: Secondary | ICD-10-CM | POA: Diagnosis not present

## 2021-06-12 DIAGNOSIS — K648 Other hemorrhoids: Secondary | ICD-10-CM

## 2021-06-12 MED ORDER — SODIUM CHLORIDE 0.9 % IV SOLN: 500.0000 mL | Freq: Once | INTRAVENOUS | Status: DC

## 2021-06-12 NOTE — Op Note (Addendum)
[facility] ?Patient Name: Gabrielle Page ?Procedure Date: 06/12/2021 2:14 PM ?MRN: [id number] ?Endoscopist: Thornton Park MD, MD ?Age: 60 ?Referring MD:  ?Date of Birth: [birth year] ?Gender: Female ?Account #: [id number] ?Procedure:                Colonoscopy ?Indications:              Surveillance: History of adenomatous polyps,  ?                          inadequate prep on last exam (<91yr ?                          Colonoscopy 04/03/2020: poor prep, 368mpolyp removed  ?                          but not retrieved ?Medicines:                Monitored Anesthesia Care ?Procedure:                Pre-Anesthesia Assessment: ?                          - Prior to the procedure, a History and Physical  ?                          was performed, and patient medications and  ?                          allergies were reviewed. The patient's tolerance of  ?                          previous anesthesia was also reviewed. The risks  ?                          and benefits of the procedure and the sedation  ?                          options and risks were discussed with the patient.  ?                          All questions were answered, and informed consent  ?                          was obtained. Prior Anticoagulants: The patient has  ?                          taken no previous anticoagulant or antiplatelet  ?                          agents. ASA Grade Assessment: III - A patient with  ?                          severe systemic disease. After reviewing the risks  ?  and benefits, the patient was deemed in  ?                          satisfactory condition to undergo the procedure. ?                          After obtaining informed consent, the colonoscope  ?                          was passed under direct vision. Throughout the  ?                          procedure, the patient's blood pressure, pulse, and  ?                          oxygen saturations were monitored continuously.  The  ?                          Olympus Scope [id number] was introduced through the  ?                          anus and advanced to the 3 cm into the ileum. The  ?                          colonoscopy was performed with moderate difficulty  ?                          due to retained stool in the right colon. The  ?                          patient tolerated the procedure well. The quality  ?                          of the bowel preparation was adequate after lavage  ?                          of the residual yellow, gritty stool present in the  ?                          ascending colon. The terminal ileum, ileocecal  ?                          valve, appendiceal orifice, and rectum were  ?                          photographed. ?Scope In: 2:24:51 PM ?Scope Out: 2:48:35 PM ?Scope Withdrawal Time: 0 hours 16 minutes 26 seconds  ?Total Procedure Duration: 0 hours 23 minutes 44 seconds  ?Findings:                 The perianal and digital rectal examinations were  ?                          normal. ?  Non-bleeding internal hemorrhoids were found. ?                          The colon (entire examined portion) appeared normal. ?                          The exam was otherwise without abnormality on  ?                          direct and retroflexion views. ?Complications:            No immediate complications. ?Estimated Blood Loss:     Estimated blood loss: none. ?Impression:               - Non-bleeding internal hemorrhoids. ?                          - The entire examined colon is normal. ?                          - The examination was otherwise normal on direct  ?                          and retroflexion views. ?                          - No specimens collected. ?Recommendation:           - Patient has a contact number available for  ?                          emergencies. The signs and symptoms of potential  ?                          delayed complications were discussed with the  ?                           patient. Return to normal activities tomorrow.  ?                          Written discharge instructions were provided to the  ?                          patient. ?                          - Resume previous diet. ?                          - Continue present medications. May resume Plavix  ?                          today. ?                          - Repeat colonoscopy in 7 years for surveillance.  ?  Plan two day bowel prep at that time. ?                          - Emerging evidence supports eating a diet of  ?                          fruits, vegetables, grains, calcium, and yogurt  ?                          while reducing red meat and alcohol may reduce the  ?                          risk of colon cancer. ?                          - Thank you for allowing me to be involved in your  ?                          colon cancer prevention. ?Thornton Park MD, MD ?06/12/2021 2:54:48 PM ?This report has been signed electronically. ?

## 2021-06-12 NOTE — Patient Instructions (Addendum)
Thank you for coming in to see [place] today! ?Resume your normal diet today. ?Restart Plavix today along with any other medications. ?Return to normal daily activities tomorrow. ?Recommend surveillance colonoscopy in 7 years. ? ? ?YOU HAD AN ENDOSCOPIC PROCEDURE TODAY AT [facility]:   Refer to the procedure report that was given to you for any specific questions about what was found during the examination.  If the procedure report does not answer your questions, please call your gastroenterologist to clarify.  If you requested that your care partner not be given the details of your procedure findings, then the procedure report has been included in a sealed envelope for you to review at your convenience later. ? ?YOU SHOULD EXPECT: Some feelings of bloating in the abdomen. Passage of more gas than usual.  Walking can help get rid of the air that was put into your GI tract during the procedure and reduce the bloating. If you had a lower endoscopy (such as a colonoscopy or flexible sigmoidoscopy) you may notice spotting of blood in your stool or on the toilet paper. If you underwent a bowel prep for your procedure, you may not have a normal bowel movement for a few days. ? ?Please Note:  You might notice some irritation and congestion in your nose or some drainage.  This is from the oxygen used during your procedure.  There is no need for concern and it should clear up in a day or so. ? ?SYMPTOMS TO REPORT IMMEDIATELY: ? ?Following lower endoscopy (colonoscopy or flexible sigmoidoscopy): ? Excessive amounts of blood in the stool ? Significant tenderness or worsening of abdominal pains ? Swelling of the abdomen that is new, acute ? Fever of 100?F or higher ? ? ?For urgent or emergent issues, a gastroenterologist can be reached at any hour by calling [phone]. ?Do not use MyChart messaging for urgent concerns.  ? ? ?DIET:  We do recommend a small meal at first, but then you may proceed to your regular  diet.  Drink plenty of fluids but you should avoid alcoholic beverages for 24 hours. ? ?ACTIVITY:  You should plan to take it easy for the rest of today and you should NOT DRIVE or use heavy machinery until tomorrow (because of the sedation medicines used during the test).   ? ?FOLLOW UP: ?Our staff will call the number listed on your records 48-72 hours following your procedure to check on you and address any questions or concerns that you may have regarding the information given to you following your procedure. If we do not reach you, we will leave a message.  We will attempt to reach you two times.  During this call, we will ask if you have developed any symptoms of COVID 19. If you develop any symptoms (ie: fever, flu-like symptoms, shortness of breath, cough etc.) before then, please call [phone].  If you test positive for Covid 19 in the 2 weeks post procedure, please call and report this information to [place].   ? ?If any biopsies were taken you will be contacted by phone or by letter within the next 1-3 weeks.  Please call us at [phone] if you have not heard about the biopsies in 3 weeks.  ? ? ?SIGNATURES/CONFIDENTIALITY: ?You and/or your care partner have signed paperwork which will be entered into your electronic medical record.  These signatures attest to the fact that that the information above on your After Visit Summary has been reviewed and is  understood.  Full responsibility of the confidentiality of this discharge information lies with you and/or your care-partner.  ?

## 2021-06-12 NOTE — Progress Notes (Signed)
? ?Referring Provider: Jaclynn Major, NP ?Primary Care Physician:  Jaclynn Major, NP ? ?Indication for Procedure:  Colon cancer Surveillance ? ? ?IMPRESSION:  ?Need for colon cancer surveillance ?Appropriate candidate for monitored anesthesia care ? ?PLAN: ?Colonoscopy in the [facility] today ? ? ?HPI: Gabrielle Page is a 61 y.o. female presents for surveillance colonoscopy. ? ?Prior endoscopic history: ? 03/24/2020 colonoscopy with poor preparation of the colon, nonbleeding internal hemorrhoids and one 3 mm polyp in the transverse colon, repeat recommended a year given poor prep. ? ?No baseline GI symptoms.  ? ?No known family history of colon cancer or polyps. No family history of uterine/endometrial cancer, pancreatic cancer or gastric/stomach cancer. ? ? ?Past Medical History:  ?Diagnosis Date  ? Abnormal abdominal CT scan 05/14/2019  ? Acute ST elevation myocardial infarction (STEMI) of inferolateral wall ([place]) 11/24/2018  ? Anemia 05/14/2019  ? Anxiety 05/14/2019  ? Asthma   ? CHF (congestive heart failure) ([place])   ? Chronic bronchitis ([place])   ? "usually get it q yr" (11/05/2016)  ? Chronic diastolic heart failure ([place]) 04/17/2017  ? Constipation 05/14/2019  ? Coronary artery disease   ? Coronary artery disease involving native coronary artery of native heart with unstable angina pectoris ([place])   ? Coronary artery disease involving native coronary artery with angina pectoris ([place]) 04/08/2017  ? Fecal impaction ([place]) 05/14/2019  ? GERD (gastroesophageal reflux disease)   ? Headache   ? "weekly" (11/05/2016)  ? Heart murmur   ? "dx'd when I was a child"  ? Hepatitis 1991  ? "when I had my gallbladder out" (11/05/2016)  ? History of blood transfusion 1985  ? S/P childbirth  ? History of kidney stones   ? History of peptic ulcer 05/14/2019  ? HLD (hyperlipidemia) 11/27/2018  ? HTN (hypertension) 05/14/2019  ? Hypokalemia 04/17/2017  ? Iron deficiency anemia 03/07/2019  ? Irritable bowel syndrome 05/14/2019  ? Kidney stones   ?  LBBB (left bundle branch block) 11/05/2016  ? NSTEMI (non-ST elevated myocardial infarction) ([place]) 11/05/2016  ? 9/18 PCI/DESx1 to p/mLAD  ? Orthostatic hypotension   ? Pancreatitis 1991  ? Pericardial effusion 04/08/2017  ? Pneumonia   ? "twice" (11/05/2016)  ? 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 Seizures ([place])   ? Severe mitral regurgitation 05/14/2019  ? Syncope 05/14/2019  ? Therapeutic opioid induced constipation 05/14/2019  ? Unstable angina ([place]) 03/04/2019  ? ? ?Past Surgical History:  ?Procedure Laterality Date  ? ABDOMINAL HYSTERECTOMY  1990  ? ANAL RECTAL MANOMETRY N/A 10/26/2020  ? Procedure: ANO RECTAL MANOMETRY;  Surgeon: Thornton Park, MD;  Location: [facility];  Service: Gastroenterology;  Laterality: N/A;  ? ANKLE FRACTURE SURGERY  2021  ? left  ? APPENDECTOMY  1992  ? BACK SURGERY    ? BILE DUCT EXPLORATION  1991  ? "reconstruction"  ? [place]  ? CHOLECYSTECTOMY OPEN  1991  ? COLONOSCOPY    ? CORONARY ANGIOPLASTY WITH STENT PLACEMENT  11/05/2016  ? CORONARY ARTERY BYPASS GRAFT N/A 03/26/2017  ? Procedure: CORONARY ARTERY BYPASS GRAFTING (CABG) x 3 WITH ENDOSCOPIC HARVESTING OF RIGHT SAPHENOUS VEIN;  Surgeon: Ivin Poot, MD;  Location: [facility];  Service: Open Heart Surgery;  Laterality: N/A;  ? CORONARY STENT INTERVENTION N/A 11/05/2016  ? Procedure: CORONARY STENT INTERVENTION;  Surgeon: Martinique, Peter M, MD;  Location: [facility];  Service: Cardiovascular;  Laterality: N/A;  ? CYSTOSCOPY WITH STENT PLACEMENT  "3 times" (11/05/2016)  ? DILATION  AND CURETTAGE OF UTERUS  1989  ? ESOPHAGOGASTRODUODENOSCOPY (EGD) WITH PROPOFOL N/A 03/25/2020  ? Procedure: ESOPHAGOGASTRODUODENOSCOPY (EGD) WITH PROPOFOL;  Surgeon: Yetta Flock, MD;  Location: [facility];  Service: Gastroenterology;  Laterality: N/A;  ? HEMOSTASIS CLIP PLACEMENT  03/25/2020  ? Procedure: HEMOSTASIS CLIP PLACEMENT;  Surgeon: Yetta Flock, MD;  Location: [facility];  Service: Gastroenterology;;  ? INTRAVASCULAR  ULTRASOUND/IVUS N/A 03/22/2017  ? Procedure: Intravascular Ultrasound/IVUS;  Surgeon: Martinique, Peter M, MD;  Location: [facility];  Service: Cardiovascular;  Laterality: N/A;  ? KNEE ARTHROSCOPY Right 2000s  ? LEFT HEART CATH AND CORONARY ANGIOGRAPHY N/A 11/05/2016  ? Procedure: LEFT HEART CATH AND CORONARY ANGIOGRAPHY;  Surgeon: Martinique, Peter M, MD;  Location: [facility];  Service: Cardiovascular;  Laterality: N/A;  ? LEFT HEART CATH AND CORONARY ANGIOGRAPHY N/A 03/22/2017  ? Procedure: LEFT HEART CATH AND CORONARY ANGIOGRAPHY;  Surgeon: Martinique, Peter M, MD;  Location: [facility];  Service: Cardiovascular;  Laterality: N/A;  ? LEFT HEART CATH AND CORS/GRAFTS ANGIOGRAPHY N/A 03/05/2019  ? Procedure: LEFT HEART CATH AND CORS/GRAFTS ANGIOGRAPHY;  Surgeon: Leonie Man, MD;  Location: [facility];  Service: Cardiovascular;  Laterality: N/A;  ? NASAL SINUS SURGERY  ~ 2009  ? "exposed to mildew; had to have the infectoin drained; cut the outside of my face"  ? OVARIAN CYST SURGERY  X 6  ? RIGHT/LEFT HEART CATH AND CORONARY/GRAFT ANGIOGRAPHY N/A 11/24/2018  ? Procedure: RIGHT/LEFT HEART CATH AND CORONARY/GRAFT ANGIOGRAPHY;  Surgeon: Leonie Man, MD;  Location: [facility];  Service: Cardiovascular;  Laterality: N/A;  ? TEE WITHOUT CARDIOVERSION N/A 03/26/2017  ? Procedure: TRANSESOPHAGEAL ECHOCARDIOGRAM (TEE);  Surgeon: Prescott Gum, [name], MD;  Location: [facility];  Service: Open Heart Surgery;  Laterality: N/A;  ? UPPER GASTROINTESTINAL ENDOSCOPY    ? ? ?Current Outpatient Medications  ?Medication Sig Dispense Refill  ? albuterol (VENTOLIN HFA) 108 (90 Base) MCG/ACT inhaler Inhale 2 puffs into the lungs every 6 (six) hours as needed for wheezing or shortness of breath.    ? AMITIZA 24 MCG capsule TAKE 1 CAPSULE BY MOUTH TWICE DAILY WITH A MEAL 180 capsule 3  ? aspirin EC 81 MG tablet Take 81 mg by mouth daily.    ? atorvastatin (LIPITOR) 80 MG tablet Take 1 tablet (80 mg total) by  mouth daily at 6 PM. 30 tablet 6  ? busPIRone (BUSPAR) 10 MG tablet Take 10 mg by mouth 3 (three) times daily.    ? busPIRone (BUSPAR) 15 MG tablet Take 15 mg by mouth 3 (three) times daily.    ? cephALEXin (KEFLEX) 250 MG capsule Take 250 mg by mouth every 8 (eight) hours.    ? cetirizine (ZYRTEC) 10 MG tablet Take 10 mg by mouth daily.    ? clopidogrel (PLAVIX) 75 MG tablet Take 75 mg by mouth daily.    ? dicyclomine (BENTYL) 10 MG capsule Take 1 capsule (10 mg total) by mouth every 8 (eight) hours as needed for spasms. 30 capsule 5  ? DULoxetine (CYMBALTA) 20 MG capsule Take 20 mg by mouth daily.    ? famotidine (PEPCID) 20 MG tablet Take 1 tablet (20 mg total) by mouth 2 (two) times daily. 60 tablet 3  ? fluocinonide (LIDEX) 0.05 % external solution Apply 1 application topically 2 (two) times daily.    ? furosemide (LASIX) 40 MG tablet TAKE 1 TABLET BY MOUTH 3 TIMES A WEEK 45 tablet 3  ?  gabapentin (NEURONTIN) 100 MG capsule Take 100 mg by mouth 2 (two) times daily.    ? ketoconazole (NIZORAL) 2 % shampoo Apply 1 application topically 2 (two) times a week.    ? metoprolol tartrate (LOPRESSOR) 25 MG tablet Take 1 tablet (25 mg total) by mouth 2 (two) times daily. 180 tablet 3  ? midodrine (PROAMATINE) 10 MG tablet Take 1 tablet (10 mg total) by mouth 4 (four) times daily. 360 tablet 2  ? Multiple Vitamin (MULTIVITAMIN WITH MINERALS) TABS tablet Take 1 tablet by mouth daily.    ? nitroGLYCERIN (NITROSTAT) 0.4 MG SL tablet Place 0.4 mg under the tongue every 5 (five) minutes as needed for chest pain.    ? ondansetron (ZOFRAN) 4 MG tablet Take 1 tablet (4 mg total) by mouth every 8 (eight) hours as needed for nausea or vomiting. 30 tablet 3  ? pantoprazole (PROTONIX) 40 MG tablet Take 1 tablet (40 mg total) by mouth 2 (two) times daily. 60 tablet 2  ? potassium chloride SA (KLOR-CON) 20 MEQ tablet Take 20 mEq by mouth daily as needed (with each dose of furosemide).    ? ranolazine (RANEXA) 1000 MG SR tablet TAKE 1   BY MOUTH TWICE DAILY 180 tablet 1  ? rOPINIRole (REQUIP) 0.5 MG tablet Take 0.5 mg by mouth at bedtime.    ? sertraline (ZOLOFT) 50 MG tablet Take 50 mg by mouth daily.    ? TALTZ 80 MG/ML SOSY [place]

## 2021-06-12 NOTE — Progress Notes (Signed)
PT taken to PACU. Monitors in place. VSS. Report given to RN. 

## 2021-06-14 ENCOUNTER — Telehealth: Payer: Self-pay

## 2021-06-14 NOTE — Telephone Encounter (Signed)
Attempted to reach patient for post-procedure f/u call. No answer. Left message for her to please not hesitate to call us if she has any questions/concerns regarding her care. 

## 2021-06-14 NOTE — Telephone Encounter (Signed)
No answer on follow up call. 

## 2021-06-16 ENCOUNTER — Telehealth: Payer: Self-pay | Admitting: Gastroenterology

## 2021-06-16 NOTE — Telephone Encounter (Signed)
We received a call from patient requesting to speak with nurse. Per patient, she was supposed to be referred to a "throat doctor" and has not received a call. Please advise. Patient states she's getting worse.  ?

## 2021-06-16 NOTE — Telephone Encounter (Signed)
Gabrielle Page ENT contacted: Spoke with Terri: Pt was scheduled to see Dr. Redmond Baseman on June 27th at 3:00:  ?Phone Number provided to pt.  ?Pt verbalized understanding with all questions answered.  ? ?

## 2021-07-09 ENCOUNTER — Other Ambulatory Visit: Payer: Self-pay | Admitting: Cardiology

## 2021-07-17 ENCOUNTER — Encounter: Payer: Self-pay | Admitting: Allergy and Immunology

## 2021-07-17 ENCOUNTER — Ambulatory Visit: Payer: Medicaid Other | Admitting: Allergy and Immunology

## 2021-07-17 VITALS — BP 94/52 | HR 56 | Resp 16 | Ht 62.7 in | Wt 112.8 lb

## 2021-07-17 DIAGNOSIS — J3089 Other allergic rhinitis: Secondary | ICD-10-CM

## 2021-07-17 DIAGNOSIS — R1314 Dysphagia, pharyngoesophageal phase: Secondary | ICD-10-CM | POA: Diagnosis not present

## 2021-07-17 DIAGNOSIS — K219 Gastro-esophageal reflux disease without esophagitis: Secondary | ICD-10-CM

## 2021-07-17 MED ORDER — MUPIROCIN 2 % EX OINT: TOPICAL_OINTMENT | CUTANEOUS | 0 refills | Status: AC

## 2021-07-17 MED ORDER — MONTELUKAST SODIUM 10 MG PO TABS: 10.0000 mg | ORAL_TABLET | Freq: Every day | ORAL | 5 refills | Status: AC

## 2021-07-17 NOTE — Patient Instructions (Addendum)
  1.  Allergen avoidance measures - check area 2 aeroallergen profile  2.  Treat and prevent inflammation:  A.  Montelukast 10 mg -1 tablet 1 time per day B.  Flonase - 1 spray each nostril 2 times per day (none left nostril)  3.  Treat and prevent LPR:  A. Continue Pantoprazole 2 times per day B. Continue famotidine 2 times per day C. Eliminate all source of caffeine consumption D. Replace throat clearing with drinking / swallowing maneuver  4. Treat left nostril irritation / bleed with the following:  A. No Flonase sprayed in nostril B. Nasal saline followed by bactroban ointment 3 times a day x 10 days  5.  If needed:  A. Cetirizine 10 mg - 1 tablet 1 time per day  6. Complete evaluation with ENT  7. Return to clinic in 4 weeks or earlier if problem

## 2021-07-17 NOTE — Progress Notes (Signed)
[place]   Dear Gavin Pound,  Thank you for referring Gabrielle Page to the [facility] on 07/17/2021.   Below is a summation of this patient's evaluation and recommendations.  Thank you for your referral. I will keep you informed about this patient's response to treatment.   If you have any questions please do not hesitate to contact me.   Sincerely,  Jiles Prows, MD Allergy / Immunology [place]   ______________________________________________________________________    NEW PATIENT NOTE  Referring Provider: Jaclynn Major, NP Primary Provider: Jaclynn Major, NP Date of office visit: 07/17/2021    Subjective:   Chief Complaint:  Gabrielle Page (DOB: 01-Jun-1960) is a 61 y.o. female who presents to the clinic on 07/17/2021 with a chief complaint of Cough and post nasal drip .     HPI: Gabrielle Page presents to this clinic in evaluation of respiratory drug problems.  For several years she has been having issues with sneezing and some occasional nasal congestion without any anosmia for which she has been consistently using a nasal steroid spray and cetirizine which helps her somewhat.  The symptoms occur in a perennial basis and flare during the spring and fall and outdoor exposure appears to precipitate these issues.  She has noticed the development of some nosebleeding recently.  As well, she has issues with her throat.  She has coughing because of constant postnasal drip and she cannot clear out her throat.  She has spells of coughing associated with posttussive emesis.  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  She feels as though food gets lodged in the upper part of her throat.  It is hard for her to swallow and it precipitates pain when she swallows.  She is scheduled to see  ENT regarding this issue at some point in the near future.  She does not have a history of cold air induced bronchospastic symptoms although sometimes if she exerts herself to some degree degree she will get a cough.  She is currently being treated for reflux disease with pantoprazole twice a day and either cimetidine or famotidine.  She drinks 2 coffees per week and has tea on a daily basis and does not consume any chocolate.  Past Medical History:  Diagnosis Date  . Abnormal abdominal CT scan 05/14/2019  . Acute ST elevation myocardial infarction (STEMI) of inferolateral wall ([place]) 11/24/2018  . Anemia 05/14/2019  . Anxiety 05/14/2019  . Arthritis   . Asthma   . CHF (congestive heart failure) ([place])   . Chronic bronchitis ([place])    "usually get it q yr" (11/05/2016)  . Chronic diastolic heart failure ([place]) 04/17/2017  . Clotting disorder ([place])   . Constipation 05/14/2019  . Coronary artery disease   . Coronary artery disease involving native coronary artery of native heart with unstable angina pectoris ([place])   . Coronary artery disease involving native coronary artery with angina pectoris ([place]) 04/08/2017  . Fecal impaction ([place]) 05/14/2019  . GERD (gastroesophageal reflux disease)   . Headache    "weekly" (11/05/2016)  . Heart murmur    "dx'd when I was a child"  . Hepatitis 1991   "when I had my gallbladder out" (11/05/2016)  . History of blood transfusion 1985   S/P childbirth  . History of kidney stones   . History of peptic ulcer 05/14/2019  .  HLD (hyperlipidemia) 11/27/2018  . HTN (hypertension) 05/14/2019  . Hypokalemia 04/17/2017  . Iron deficiency anemia 03/07/2019  . Irritable bowel syndrome 05/14/2019  . Kidney stones   . LBBB (left bundle branch block) 11/05/2016  . NSTEMI (non-ST elevated myocardial infarction) ([place]) 11/05/2016   9/18 PCI/DESx1 to p/mLAD  . Orthostatic hypotension   . Osteoporosis   . Pancreatitis 1991  . Pericardial effusion 04/08/2017  .  Pneumonia    "twice" (11/05/2016)  . Psoriasis   . 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 Seizures ([place])   . Severe mitral regurgitation 05/14/2019  . Syncope 05/14/2019  . Therapeutic opioid induced constipation 05/14/2019  . Unstable angina ([place]) 03/04/2019    Past Surgical History:  Procedure Laterality Date  . ABDOMINAL HYSTERECTOMY  1990  . ANAL RECTAL MANOMETRY N/A 10/26/2020   Procedure: ANO RECTAL MANOMETRY;  Surgeon: Thornton Park, MD;  Location: [facility];  Service: Gastroenterology;  Laterality: N/A;  . ANKLE FRACTURE SURGERY  2021   left  . APPENDECTOMY  1992  . BACK SURGERY    . BILE DUCT EXPLORATION  1991   "reconstruction"  . CESAREAN SECTION  1985  . CHOLECYSTECTOMY OPEN  1991  . COLONOSCOPY    . CORONARY ANGIOPLASTY WITH STENT PLACEMENT  11/05/2016  . CORONARY ARTERY BYPASS GRAFT N/A 03/26/2017   Procedure: CORONARY ARTERY BYPASS GRAFTING (CABG) x 3 WITH ENDOSCOPIC HARVESTING OF RIGHT SAPHENOUS VEIN;  Surgeon: Ivin Poot, MD;  Location: [facility];  Service: Open Heart Surgery;  Laterality: N/A;  . CORONARY STENT INTERVENTION N/A 11/05/2016   Procedure: CORONARY STENT INTERVENTION;  Surgeon: Martinique, Peter M, MD;  Location: [facility];  Service: Cardiovascular;  Laterality: N/A;  . CYSTOSCOPY WITH STENT PLACEMENT  "3 times" (11/05/2016)  . DILATION AND CURETTAGE OF UTERUS  1989  . ESOPHAGOGASTRODUODENOSCOPY (EGD) WITH PROPOFOL N/A 03/25/2020   Procedure: ESOPHAGOGASTRODUODENOSCOPY (EGD) WITH PROPOFOL;  Surgeon: Yetta Flock, MD;  Location: [facility];  Service: Gastroenterology;  Laterality: N/A;  . HEMOSTASIS CLIP PLACEMENT  03/25/2020   Procedure: HEMOSTASIS CLIP PLACEMENT;  Surgeon: Yetta Flock, MD;  Location: [facility];  Service: Gastroenterology;;  . INTRAVASCULAR ULTRASOUND/IVUS N/A 03/22/2017   Procedure: Intravascular Ultrasound/IVUS;  Surgeon: Martinique, Peter M, MD;  Location: [facility];  Service: Cardiovascular;  Laterality:  N/A;  . KNEE ARTHROSCOPY Right 2000s  . LEFT HEART CATH AND CORONARY ANGIOGRAPHY N/A 11/05/2016   Procedure: LEFT HEART CATH AND CORONARY ANGIOGRAPHY;  Surgeon: Martinique, Peter M, MD;  Location: [facility];  Service: Cardiovascular;  Laterality: N/A;  . LEFT HEART CATH AND CORONARY ANGIOGRAPHY N/A 03/22/2017   Procedure: LEFT HEART CATH AND CORONARY ANGIOGRAPHY;  Surgeon: Martinique, Peter M, MD;  Location: [facility];  Service: Cardiovascular;  Laterality: N/A;  . LEFT HEART CATH AND CORS/GRAFTS ANGIOGRAPHY N/A 03/05/2019   Procedure: LEFT HEART CATH AND CORS/GRAFTS ANGIOGRAPHY;  Surgeon: Leonie Man, MD;  Location: [facility];  Service: Cardiovascular;  Laterality: N/A;  . NASAL SINUS SURGERY  ~ 2009   "exposed to mildew; had to have the infectoin drained; cut the outside of my face"  . OVARIAN CYST SURGERY  X 6  . RIGHT/LEFT HEART CATH AND CORONARY/GRAFT ANGIOGRAPHY N/A 11/24/2018   Procedure: RIGHT/LEFT HEART CATH AND CORONARY/GRAFT ANGIOGRAPHY;  Surgeon: Leonie Man, MD;  Location: [facility];  Service: Cardiovascular;  Laterality: N/A;  . TEE WITHOUT CARDIOVERSION N/A 03/26/2017   Procedure: TRANSESOPHAGEAL ECHOCARDIOGRAM (TEE);  Surgeon: Prescott Gum, [name], MD;  Location: [facility];  Service: Open Heart Surgery;  Laterality: N/A;  . UPPER GASTROINTESTINAL ENDOSCOPY      Allergies as of 07/17/2021       Reactions   Amiodarone Diarrhea, Nausea And Vomiting   In combination with colchicine   Colchicine Diarrhea, Nausea And Vomiting   Pt states she had rnx to medication; severe V/D   Compazine [prochlorperazine Edisylate] Other (See Comments)   SEIZURE   Other Other (See Comments), Cough   Steroids cause uncontrollable coughing        Medication List    albuterol 108 (90 Base) MCG/ACT inhaler Commonly known as: VENTOLIN HFA Inhale 2 puffs into the lungs every 6 (six) hours as needed for wheezing or shortness of breath.   Amitiza 24 MCG  capsule Generic drug: lubiprostone TAKE 1 CAPSULE BY MOUTH TWICE DAILY WITH A MEAL   aspirin EC 81 MG tablet Take 81 mg by mouth daily.   atorvastatin 80 MG tablet Commonly known as: LIPITOR Take 1 tablet (80 mg total) by mouth daily at 6 PM.   busPIRone 15 MG tablet Commonly known as: BUSPAR Take 15 mg by mouth 3 (three) times daily.   cetirizine 10 MG tablet Commonly known as: ZYRTEC Take 10 mg by mouth daily.   clopidogrel 75 MG tablet Commonly known as: PLAVIX Take 75 mg by mouth daily.   dicyclomine 10 MG capsule Commonly known as: BENTYL Take 1 capsule (10 mg total) by mouth every 8 (eight) hours as needed for spasms.   DULoxetine 20 MG capsule Commonly known as: CYMBALTA Take 20 mg by mouth daily.   famotidine 20 MG tablet Commonly known as: PEPCID Take 1 tablet (20 mg total) by mouth 2 (two) times daily.   fluocinonide 0.05 % external solution Commonly known as: LIDEX Apply 1 application topically 2 (two) times daily.   fluticasone 50 MCG/ACT nasal spray Commonly known as: FLONASE Place 1 spray into both nostrils daily.   furosemide 40 MG tablet Commonly known as: LASIX TAKE 1 TABLET BY MOUTH 3 TIMES A WEEK   gabapentin 100 MG capsule Commonly known as: NEURONTIN Take 100 mg by mouth 2 (two) times daily.   metoprolol tartrate 25 MG tablet Commonly known as: LOPRESSOR Take 1 tablet (25 mg total) by mouth 2 (two) times daily.   midodrine 10 MG tablet Commonly known as: PROAMATINE Take 1 tablet (10 mg total) by mouth 4 (four) times daily.   multivitamin with minerals Tabs tablet Take 1 tablet by mouth daily.   nitroGLYCERIN 0.4 MG SL tablet Commonly known as: NITROSTAT Place 0.4 mg under the tongue every 5 (five) minutes as needed for chest pain.   ondansetron 4 MG tablet Commonly known as: ZOFRAN Take 1 tablet (4 mg total) by mouth every 8 (eight) hours as needed for nausea or vomiting.   pantoprazole 40 MG tablet Commonly known as:  PROTONIX Take 1 tablet (40 mg total) by mouth 2 (two) times daily.   potassium chloride SA 20 MEQ tablet Commonly known as: KLOR-CON M Take 20 mEq by mouth daily as needed (with each dose of furosemide).   ranolazine 1000 MG SR tablet Commonly known as: RANEXA Take 1 tablet by mouth twice daily   rOPINIRole 0.5 MG tablet Commonly known as: REQUIP Take 0.5 mg by mouth at bedtime.   sertraline 50 MG tablet Commonly known as: ZOLOFT Take 50 mg by mouth daily.   Taltz 80 MG/ML [place] Generic drug: Ixekizumab  Inject into the skin every 14 (fourteen) days.   trazodone 300 MG tablet Commonly known as: DESYREL Take 300 mg by mouth at bedtime.   triamcinolone ointment 0.1 % Commonly known as: KENALOG Apply 1 application topically 2 (two) times daily.        Review of systems negative except as noted in HPI / PMHx or noted below:  Review of Systems  Constitutional: Negative.   HENT: Negative.    Eyes: Negative.   Respiratory: Negative.    Cardiovascular: Negative.   Gastrointestinal: Negative.   Genitourinary: Negative.   Musculoskeletal: Negative.   Skin: Negative.   Neurological: Negative.   Endo/Heme/Allergies: Negative.   Psychiatric/Behavioral: Negative.     Family History  Problem Relation Age of Onset  . Suicidality Mother   . Hypertension Father   . Alcohol abuse Father   . Heart attack Father   . Diabetes Mellitus II Father   . Asthma Sister   . Breast cancer Sister   . Diabetes Maternal Grandmother   . Colon cancer Paternal Grandfather   . Colon polyps Paternal Grandfather   . Esophageal cancer Neg Hx   . Rectal cancer Neg Hx   . Stomach cancer Neg Hx     Social History   Socioeconomic History  . Marital status: Divorced    Spouse name: Not on file  . Number of children: Not on file  . Years of education: Not on file  . Highest education level: Not on file  Occupational History  . Occupation: disable  Tobacco Use  . Smoking status: Never   . Smokeless tobacco: Never  Vaping Use  . Vaping Use: Never used  Substance and Sexual Activity  . Alcohol use: Yes    Comment: 11/05/2016 "maybe 1 glass of wine/month"  . Drug use: No  . Sexual activity: Not Currently  Other Topics Concern  . Not on file  Social History Narrative   Right handed    Lives with boy friend    Social Determinants of Health   Financial Resource Strain: Not on file  Food Insecurity: Not on file  Transportation Needs: Not on file  Physical Activity: Not on file  Stress: Not on file  Social Connections: Not on file  Intimate Partner Violence: Not on file    Environmental and Social history   Objective:   Vitals:   07/17/21 0949  BP: (!) 94/52  Pulse: (!) 56  Resp: 16  SpO2: 98%   Height: 5' 2.7" (159.3 cm) Weight: 112 lb 12.8 oz (51.2 kg)  Physical Exam Constitutional:      Appearance: She is not diaphoretic.  HENT:     Head: Normocephalic.     Right Ear: Tympanic membrane, ear canal and external ear normal.     Left Ear: Tympanic membrane, ear canal and external ear normal.     Nose: Mucosal edema (Eschar left nasal septum) present. No rhinorrhea.     Mouth/Throat:     Dentition: Has dentures.     Pharynx: Uvula midline. No oropharyngeal exudate.  Eyes:     Conjunctiva/sclera: Conjunctivae normal.  Neck:     Thyroid: No thyromegaly.     Trachea: Trachea normal. No tracheal tenderness or tracheal deviation.  Cardiovascular:     Rate and Rhythm: Normal rate and regular rhythm.     Heart sounds: S1 normal and S2 normal. Murmur (Systolic) heard.  Pulmonary:     [place]: No respiratory distress.     Breath sounds: Normal breath  sounds. No stridor. No wheezing or rales.  Lymphadenopathy:     Head:     Right side of head: No tonsillar adenopathy.     Left side of head: No tonsillar adenopathy.     Cervical: No cervical adenopathy.  Skin:    Findings: No erythema or rash.     Nails: There is no clubbing.  Neurological:      Mental Status: She is alert.    Diagnostics: Allergy skin tests were performed.   Spirometry was performed and demonstrated an FEV1 of 1.95 @ 80 % of predicted. FEV1/FVC = 0.78  A brain MRI obtained 26 February 2020 identified the following:  Sinuses/Orbits: Paranasal sinuses are aerated. Orbits are unremarkable.  A chest CT scan obtained 25 March 2020 identified the following:  Cardiovascular: The heart is upper limits of normal in size and stable. No pericardial effusion. The aorta is normal in caliber. No dissection. Stable surgical changes from coronary artery bypass surgery. Stable age advanced coronary artery calcifications and LAD stent.   Mediastinum/Nodes: No mediastinal or hilar mass or adenopathy. No pneumomediastinum. There is mild distal esophageal wall thickening but no evidence for esophageal perforation.   Lungs/Pleura: No pneumothorax or pleural effusion or pleural hematoma. A few scattered lower lobe pulmonary nodules are stable.   Assessment and Plan:    1. Perennial allergic rhinitis     Patient Instructions   1.  Allergen avoidance measures - check area 2 aeroallergen profile  2.  Treat and prevent inflammation:  A.  Montelukast 10 mg -1 tablet 1 time per day B.  Flonase - 1 spray each nostril 2 times per day (none left nostril)  3.  Treat and prevent LPR:  A. Continue Pantoprazole 2 times per day B. Continue famotidine 2 times per day C. Eliminate all source of caffeine consumption D. Replace throat clearing with drinking / swallowing maneuver  4. Treat left nostril irritation / bleed with the following:  A. No Flonase sprayed in nostril B. Nasal saline followed by bactroban ointment 3 times a day x 10 days  5.  If needed:  A. Cetirizine 10 mg - 1 tablet 1 time per day  6. Complete evaluation with ENT  7. Return to clinic in 4 weeks or earlier if problem   Jiles Prows, MD Allergy / Immunology [facility]

## 2021-07-18 ENCOUNTER — Encounter: Payer: Self-pay | Admitting: Allergy and Immunology

## 2021-07-18 NOTE — Addendum Note (Signed)
Addended by: Guy Franco on: 07/18/2021 03:46 PM   Modules accepted: Orders

## 2021-07-19 ENCOUNTER — Telehealth: Payer: Self-pay

## 2021-07-19 NOTE — Telephone Encounter (Signed)
-----   Message from Jiles Prows, MD sent at 07/18/2021  7:44 AM EDT ----- Please inform Gabrielle Page that we need to obtain a modified barium swallow for her LPR and dysphagia.  This is different than a regular barium swallow.

## 2021-07-22 LAB — ALLERGENS W/TOTAL IGE AREA 2

## 2021-07-27 NOTE — Telephone Encounter (Signed)
Order has been sent to [facility] and they will contact Jaleen to schedule. Left a detailed messge (OK per DPR) informing her of this.

## 2021-08-08 DIAGNOSIS — R1314 Dysphagia, pharyngoesophageal phase: Secondary | ICD-10-CM

## 2021-08-08 DIAGNOSIS — R070 Pain in throat: Secondary | ICD-10-CM | POA: Insufficient documentation

## 2021-08-08 HISTORY — DX: Dysphagia, pharyngoesophageal phase: R13.14

## 2021-08-17 ENCOUNTER — Ambulatory Visit: Payer: Medicaid Other | Admitting: Allergy and Immunology

## 2021-09-06 ENCOUNTER — Other Ambulatory Visit: Payer: Self-pay | Admitting: Otolaryngology

## 2021-09-08 ENCOUNTER — Other Ambulatory Visit: Payer: Self-pay

## 2021-09-08 DIAGNOSIS — L409 Psoriasis, unspecified: Secondary | ICD-10-CM | POA: Insufficient documentation

## 2021-09-08 DIAGNOSIS — M810 Age-related osteoporosis without current pathological fracture: Secondary | ICD-10-CM | POA: Insufficient documentation

## 2021-09-08 DIAGNOSIS — M1990 Unspecified osteoarthritis, unspecified site: Secondary | ICD-10-CM | POA: Insufficient documentation

## 2021-09-08 DIAGNOSIS — D689 Coagulation defect, unspecified: Secondary | ICD-10-CM | POA: Insufficient documentation

## 2021-09-10 NOTE — Progress Notes (Unsigned)
Cardiology Office Note:    Date:  09/11/2021   ID:  Gabrielle Page, DOB 17-Oct-1960, MRN [id number]  PCP:  Gabrielle Major, NP  Cardiologist:  Gabrielle More, MD    Referring MD: Gabrielle Major, NP    ASSESSMENT:    1. Coronary artery disease of bypass graft of native heart with stable angina pectoris ([place])   2. Chronic diastolic heart failure ([place])   3. Idiopathic hypotension   4. Mixed hyperlipidemia   5. Iron deficiency anemia due to chronic blood loss    PLAN:    In order of problems listed above:  Stable CAD having no angina on current medical therapy we will continue her aspirin ranolazine and lipid-lowering with a statin. Nicely compensated continue her loop diuretic beta-blocker Continue midodrine which is giving her adequate blood pressure Avoid lightheadedness syncope and injury Continue her high intensity statin request labs from her PCP to be sure that her lipid disorder is well controlled her last LDL 1 year ago was 40 Continue iron try over-the-counter children's chewable every other day best absorbed and tolerated over-the-counter iron.  She does not appear to be severely anemic by physical exam her last hemoglobin a year ago was 10.0   Next appointment: I will plan to see her back in my office 6 months   Medication Adjustments/Labs and Tests Ordered: Current medicines are reviewed at length with the patient today.  Concerns regarding medicines are outlined above.  No orders of the defined types were placed in this encounter.  No orders of the defined types were placed in this encounter.   Chief Complaint  Patient presents with   Follow-up   Coronary Artery Disease   Congestive Heart Failure   Anemia    History of Present Illness:    Gabrielle Page is a 61 y.o. female with a hx of coronary artery disease with CABG chronic diastolic heart failure chronic anemia iron deficiency hyperlipidemia idiopathic hypotension and GI hemorrhage  with hospitalization [facility] February 2022 requiring transfusion. She underwent endoscopy had a esophageal ulcer with a visible vessel not actively bleeding but which was clipped and Plavix was held. She received a total of 4 units of packed blood cells  and was last seen 02/14/2021.  She is scheduled for endoscopy with esophageal dilation 10/04/2021.   Compliance with diet, lifestyle and medications: Yes  Overall she is doing better having no angina edema shortness of breath palpitation or syncope. Home blood pressure runs 32-202 systolic on midodrine for blood pressure support She has lab work done at her PCP office request and continues on a statin She is scheduled for endoscopy for swallowing dysfunction She has trouble tolerating iron and.  Take children's chewable with iron best tolerated over-the-counter form and take 1 tablet every other day to avoid iron blockade Past Medical History:  Diagnosis Date   Abdominal pain 04/08/2017   Abnormal abdominal CT scan 05/14/2019   Acute ST elevation myocardial infarction (STEMI) of inferolateral wall ([facility]) 11/24/2018   Anemia 05/14/2019   Anxiety 05/14/2019   Arthritis    Asthma    CHF (congestive heart failure) ([place])    Chronic bronchitis ([place])    "usually get it q yr" (11/05/2016)   Chronic diastolic heart failure ([place]) 04/17/2017   Clotting disorder ([place])    Constipation 05/14/2019   Coronary artery disease    Coronary artery disease involving native coronary artery of native heart with unstable angina pectoris ([place])    Coronary artery disease involving native  coronary artery with angina pectoris ([place]) 04/08/2017   Dyssynergic defecation    Esophageal perforation    Fecal impaction ([place]) 05/14/2019   GERD (gastroesophageal reflux disease)    Headache    "weekly" (11/05/2016)   Heart murmur    "dx'd when I was a child"   Hematemesis without nausea    Hepatitis 1991   "when I had my gallbladder out" (11/05/2016)   History  of blood transfusion 1985   S/P childbirth   History of kidney stones    History of peptic ulcer 05/14/2019   HLD (hyperlipidemia) 11/27/2018   HTN (hypertension) 05/14/2019   Hypokalemia 04/17/2017   Iron deficiency anemia 03/07/2019   Irritable bowel syndrome 05/14/2019   Kidney stones    LBBB (left bundle branch block) 11/05/2016   NSTEMI (non-ST elevated myocardial infarction) ([place]) 11/05/2016   9/18 PCI/DESx1 to p/mLAD   Orthostatic hypotension    Osteoporosis    Pancreatitis 1991   Pericardial effusion 04/08/2017   Pharyngoesophageal dysphagia 08/08/2021   Pneumonia    "twice" (11/05/2016)   Psoriasis    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   Seizures ([place])    Severe mitral regurgitation 05/14/2019   Syncope 05/14/2019   Therapeutic opioid induced constipation 05/14/2019   Throat pain 08/08/2021   Ulcer of esophagus with bleeding    Unstable angina ([place]) 03/04/2019   Upper GI bleed 03/25/2020    Past Surgical History:  Procedure Laterality Date   ABDOMINAL HYSTERECTOMY  1990   ANAL RECTAL MANOMETRY N/A 10/26/2020   Procedure: ANO RECTAL MANOMETRY;  Surgeon: Gabrielle Park, MD;  Location: [facility];  Service: Gastroenterology;  Laterality: N/A;   ANKLE FRACTURE SURGERY  2021   left   APPENDECTOMY  1992   BACK SURGERY     BILE DUCT EXPLORATION  1991   "reconstruction"   [place]   COLONOSCOPY     CORONARY ANGIOPLASTY WITH STENT PLACEMENT  11/05/2016   CORONARY ARTERY BYPASS GRAFT N/A 03/26/2017   Procedure: CORONARY ARTERY BYPASS GRAFTING (CABG) x 3 WITH ENDOSCOPIC HARVESTING OF RIGHT SAPHENOUS VEIN;  Surgeon: Gabrielle Poot, MD;  Location: [facility];  Service: Open Heart Surgery;  Laterality: N/A;   CORONARY STENT INTERVENTION N/A 11/05/2016   Procedure: CORONARY STENT INTERVENTION;  Surgeon: Martinique, Peter M, MD;  Location: [facility];  Service: Cardiovascular;  Laterality: N/A;   CYSTOSCOPY WITH STENT PLACEMENT  "3 times"  (11/05/2016)   DILATION AND CURETTAGE OF UTERUS  1989   ESOPHAGOGASTRODUODENOSCOPY (EGD) WITH PROPOFOL N/A 03/25/2020   Procedure: ESOPHAGOGASTRODUODENOSCOPY (EGD) WITH PROPOFOL;  Surgeon: Gabrielle Flock, MD;  Location: [place];  Service: Gastroenterology;  Laterality: N/A;   HEMOSTASIS CLIP PLACEMENT  03/25/2020   Procedure: HEMOSTASIS CLIP PLACEMENT;  Surgeon: Gabrielle Flock, MD;  Location: [place];  Service: Gastroenterology;;   INTRAVASCULAR ULTRASOUND/IVUS N/A 03/22/2017   Procedure: Intravascular Ultrasound/IVUS;  Surgeon: Martinique, Peter M, MD;  Location: [facility];  Service: Cardiovascular;  Laterality: N/A;   KNEE ARTHROSCOPY Right 2000s   LEFT HEART CATH AND CORONARY ANGIOGRAPHY N/A 11/05/2016   Procedure: LEFT HEART CATH AND CORONARY ANGIOGRAPHY;  Surgeon: Martinique, Peter M, MD;  Location: [place];  Service: Cardiovascular;  Laterality: N/A;   LEFT HEART CATH AND CORONARY ANGIOGRAPHY N/A 03/22/2017   Procedure: LEFT HEART CATH AND CORONARY ANGIOGRAPHY;  Surgeon: Martinique, Peter M, MD;  Location: [facility];  Service: Cardiovascular;  Laterality: N/A;  LEFT HEART CATH AND CORS/GRAFTS ANGIOGRAPHY N/A 03/05/2019   Procedure: LEFT HEART CATH AND CORS/GRAFTS ANGIOGRAPHY;  Surgeon: Leonie Man, MD;  Location: [place] [facility];  Service: Cardiovascular;  Laterality: N/A;   NASAL SINUS SURGERY  ~ 2009   "exposed to mildew; had to have the infectoin drained; cut the outside of my face"   OVARIAN CYST SURGERY  X 6   RIGHT/LEFT HEART CATH AND CORONARY/GRAFT ANGIOGRAPHY N/A 11/24/2018   Procedure: RIGHT/LEFT HEART CATH AND CORONARY/GRAFT ANGIOGRAPHY;  Surgeon: Leonie Man, MD;  Location: [facility];  Service: Cardiovascular;  Laterality: N/A;   TEE WITHOUT CARDIOVERSION N/A 03/26/2017   Procedure: TRANSESOPHAGEAL ECHOCARDIOGRAM (TEE);  Surgeon: Prescott Gum, [name], MD;  Location: [facility];  Service: Open Heart Surgery;  Laterality: N/A;    UPPER GASTROINTESTINAL ENDOSCOPY      Current Medications: Current Meds  Medication Sig   albuterol (VENTOLIN HFA) 108 (90 Base) MCG/ACT inhaler Inhale 2 puffs into the lungs every 6 (six) hours as needed for wheezing or shortness of breath.   AMITIZA 24 MCG capsule TAKE 1 CAPSULE BY MOUTH TWICE DAILY WITH A MEAL   aspirin EC 81 MG tablet Take 81 mg by mouth daily.   atorvastatin (LIPITOR) 80 MG tablet Take 1 tablet (80 mg total) by mouth daily at 6 PM.   busPIRone (BUSPAR) 10 MG tablet Take 10 mg by mouth 3 (three) times daily.   cetirizine (ZYRTEC) 10 MG tablet Take 10 mg by mouth daily.   clopidogrel (PLAVIX) 75 MG tablet Take 75 mg by mouth daily.   DULoxetine (CYMBALTA) 20 MG capsule Take 20 mg by mouth daily.   famotidine (PEPCID) 20 MG tablet Take 1 tablet (20 mg total) by mouth 2 (two) times daily.   furosemide (LASIX) 40 MG tablet TAKE 1 TABLET BY MOUTH 3 TIMES A WEEK   gabapentin (NEURONTIN) 100 MG capsule Take 100 mg by mouth 2 (two) times daily.   midodrine (PROAMATINE) 10 MG tablet Take 1 tablet (10 mg total) by mouth 4 (four) times daily.   montelukast (SINGULAIR) 10 MG tablet Take 1 tablet (10 mg total) by mouth at bedtime.   Multiple Vitamin (MULTIVITAMIN WITH MINERALS) TABS tablet Take 1 tablet by mouth daily.   nitroGLYCERIN (NITROSTAT) 0.4 MG SL tablet Place 0.4 mg under the tongue every 5 (five) minutes as needed for chest pain.   ondansetron (ZOFRAN) 4 MG tablet Take 1 tablet (4 mg total) by mouth every 8 (eight) hours as needed for nausea or vomiting.   pantoprazole (PROTONIX) 40 MG tablet Take 1 tablet (40 mg total) by mouth 2 (two) times daily.   potassium chloride SA (KLOR-CON) 20 MEQ tablet Take 20 mEq by mouth daily as needed (with each dose of furosemide).   ranolazine (RANEXA) 1000 MG SR tablet Take 1 tablet by mouth twice daily   rOPINIRole (REQUIP) 0.5 MG tablet Take 0.5 mg by mouth at bedtime.   sertraline (ZOLOFT) 50 MG tablet Take 50 mg by mouth daily.    TALTZ 80 MG/ML SOSY Inject into the skin every 14 (fourteen) days.   trazodone (DESYREL) 300 MG tablet Take 300 mg by mouth at bedtime.     Allergies:   Amiodarone, Colchicine, Compazine [prochlorperazine edisylate], and Other   Social History   Socioeconomic History   Marital status: Divorced    Spouse name: Not on file   Number of children: Not on file   Years of education: Not on file  Highest education level: Not on file  Occupational History   Occupation: disable  Tobacco Use   Smoking status: Never   Smokeless tobacco: Never  Vaping Use   Vaping Use: Never used  Substance and Sexual Activity   Alcohol use: Yes    Comment: 11/05/2016 "maybe 1 glass of wine/month"   Drug use: No   Sexual activity: Not Currently  Other Topics Concern   Not on file  Social History Narrative   Right handed    Lives with boy friend    Social Determinants of Health   Financial Resource Strain: Not on file  Food Insecurity: Not on file  Transportation Needs: Not on file  Physical Activity: Not on file  Stress: Not on file  Social Connections: Not on file     Family History: The patient's family history includes Alcohol abuse in her father; Asthma in her sister; Breast cancer in her sister; Colon cancer in her paternal grandfather; Colon polyps in her paternal grandfather; Diabetes in her maternal grandmother; Diabetes Mellitus II in her father; Heart attack in her father; Hypertension in her father; Suicidality in her mother. There is no history of Esophageal cancer, Rectal cancer, or Stomach cancer. ROS:   Please see the history of present illness.    All other systems reviewed and are negative.  EKGs/Labs/Other Studies Reviewed:    The following studies were reviewed today:    Recent Labs: No results found for requested labs within last 365 days.  Recent Lipid Panel    Component Value Date/Time   CHOL 154 08/10/2020 1108   TRIG 128 08/10/2020 1108   HDL 53 08/10/2020 1108    CHOLHDL 2.9 08/10/2020 1108   CHOLHDL 4.4 03/05/2019 0452   VLDL 39 03/05/2019 0452   LDLCALC 78 08/10/2020 1108    Physical Exam:    VS:  BP (!) 92/54   Pulse (!) 54   Ht '5\' 3"'$  (1.6 Page)   Wt 110 lb 3.2 oz (50 kg)   SpO2 97%   BMI 19.52 kg/Page     Wt Readings from Last 3 Encounters:  09/11/21 110 lb 3.2 oz (50 kg)  07/17/21 112 lb 12.8 oz (51.2 kg)  06/12/21 119 lb (54 kg)     GEN: She looks better than before he is gaining weight well nourished, well developed in no acute distress HEENT: Normal NECK: No JVD; No carotid bruits LYMPHATICS: No lymphadenopathy CARDIAC: RRR, no murmurs, rubs, gallops RESPIRATORY:  Clear to auscultation without rales, wheezing or rhonchi  ABDOMEN: Soft, non-tender, non-distended MUSCULOSKELETAL:  No edema; No deformity  SKIN: Warm and dry NEUROLOGIC:  Alert and oriented x 3 PSYCHIATRIC:  Normal affect    Signed, Gabrielle More, MD  09/11/2021 8:34 AM    [place]

## 2021-09-11 ENCOUNTER — Ambulatory Visit: Payer: Medicaid Other | Admitting: Cardiology

## 2021-09-11 ENCOUNTER — Encounter: Payer: Self-pay | Admitting: Cardiology

## 2021-09-11 VITALS — BP 92/54 | HR 54 | Ht 63.0 in | Wt 110.2 lb

## 2021-09-11 DIAGNOSIS — D500 Iron deficiency anemia secondary to blood loss (chronic): Secondary | ICD-10-CM

## 2021-09-11 DIAGNOSIS — I25708 Atherosclerosis of coronary artery bypass graft(s), unspecified, with other forms of angina pectoris: Secondary | ICD-10-CM | POA: Diagnosis not present

## 2021-09-11 DIAGNOSIS — I5032 Chronic diastolic (congestive) heart failure: Secondary | ICD-10-CM | POA: Diagnosis not present

## 2021-09-11 DIAGNOSIS — I950 Idiopathic hypotension: Secondary | ICD-10-CM | POA: Diagnosis not present

## 2021-09-11 DIAGNOSIS — E782 Mixed hyperlipidemia: Secondary | ICD-10-CM | POA: Diagnosis not present

## 2021-09-11 NOTE — Patient Instructions (Signed)
Medication Instructions:  Your physician recommends that you continue on your current medications as directed. Please refer to the Current Medication list given to you today.  *If you need a refill on your cardiac medications before your next appointment, please call your pharmacy*   Lab Work: None If you have labs (blood work) drawn today and your tests are completely normal, you will receive your results only by: [place] (if you have MyChart) OR A paper copy in the mail If you have any lab test that is abnormal or we need to change your treatment, we will call you to review the results.   Testing/Procedures: None   Follow-Up: At [facility], you and your health needs are our priority.  As part of our continuing mission to provide you with exceptional heart care, we have created designated Provider Care Teams.  These Care Teams include your primary Cardiologist (physician) and Advanced Practice Providers (APPs -  Physician Assistants and Nurse Practitioners) who all work together to provide you with the care you need, when you need it.  We recommend signing up for the patient portal called "MyChart".  Sign up information is provided on this After Visit Summary.  MyChart is used to connect with patients for Virtual Visits (Telemedicine).  Patients are able to view lab/test results, encounter notes, upcoming appointments, etc.  Non-urgent messages can be sent to your provider as well.   To learn more about what you can do with MyChart, go to [email or web address].    Your next appointment:   6 month(s)  The format for your next appointment:   In Person  Provider:   Shirlee More, MD    Other Instructions Take a childrens chewable with iron daily  Important Information About Sugar

## 2021-09-14 ENCOUNTER — Ambulatory Visit: Payer: Medicaid Other | Admitting: Allergy and Immunology

## 2021-09-27 ENCOUNTER — Other Ambulatory Visit: Payer: Self-pay | Admitting: *Deleted

## 2021-09-27 MED ORDER — FAMOTIDINE 20 MG PO TABS: 20.0000 mg | ORAL_TABLET | Freq: Two times a day (BID) | ORAL | 3 refills | Status: AC

## 2021-10-03 ENCOUNTER — Encounter (HOSPITAL_COMMUNITY): Payer: Self-pay | Admitting: Otolaryngology

## 2021-10-03 ENCOUNTER — Encounter (HOSPITAL_COMMUNITY): Payer: Self-pay | Admitting: Vascular Surgery

## 2021-10-03 ENCOUNTER — Other Ambulatory Visit: Payer: Self-pay

## 2021-10-03 NOTE — Progress Notes (Signed)
PCP - Jaclynn Major, NP Cardiologist - Dr Shirlee More Gastroenterology - Dr Laurier Nancy  CT Chest x-ray - 03/25/20 EKG - 03/14/21 Stress Test - 03/22/17 CE ECHO - 11/25/18 Cardiac Cath - 03/05/19  ICD Pacemaker/Loop - n/a  Sleep Study -  n/a CPAP - none  Aspirin/Blood Thinner Instructions: Follow your surgeon's instructions on when to stop Aspirin and Plavix prior to surgery,  If no instructions were given by your surgeon then you will need to call the office for those instructions.  Patient agrees to call MD's office for instructions.  ERAS: Clear liquids til 7:15 AM DOS  Anesthesia review: Yes  STOP now taking any Aspirin (unless otherwise instructed by your surgeon), Aleve, Naproxen, Ibuprofen, Motrin, Advil, Goody's, BC's, all herbal medications, fish oil, and all vitamins.   Coronavirus Screening Do you have any of the following symptoms:  Cough yes/no: No Fever (>100.66F)  yes/no: No Runny nose yes/no: No Sore throat yes/no: No Difficulty breathing/shortness of breath  Yes  Have you traveled in the last 14 days and where? yes/no: No  Patient verbalized understanding of instructions that were given via phone.

## 2021-10-03 NOTE — Progress Notes (Signed)
Anesthesia Chart Review:  Case: [id number] Date/Time: 10/04/21 1000   Procedure: RIGID ESOPHAGOSCOPY WITH DILATION (Bilateral)   Anesthesia type: General   Pre-op diagnosis:      Pharyngoesophageal dysphagia     Throat pain   Location: [facility] OR ROOM 10 / [place] [facility]   Surgeons: Melida Quitter, MD       DISCUSSION: Patient is a 61 year old female scheduled for the above procedure. 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 Referred to ENT with "unremarkable" fiberoptic exam of the pharynx and larynx on 08/08/21.   History includes never smoker, HTN (with idiopathic hypotension), HLD, CAD (NSTEMI, s/p DES mid LAD 11/05/16, s/p CABG 03/26/17: LIMA-LAD, SVG-DIAG, SVG-OM, post-operative PAF and small pericardial effusion; inferolateral STEMI 11/24/18 with 99% OM1 @ SVG insertion->100% after unsuccessful attempt at PCI & with occluded SVG-DIAG, small LIMA-LAD with competitive flow to the distal LAD and patent LAD stent, managed medically; stable anatomy 03/05/19), chronic diastolic CHF,  LBBB (08/3417), asthma, GERD, IBS, PUD, anemia, pharyngoesophageal dysphagia, pancreatitis (1991), hepatitis (in setting of cholecystectomy 1991), seizures (possible 2021->had neurology evaluation with Dr. Delice Lesch and etiology of symptoms unclear, no recurrence since 12/2019), psoriasis, spinal surgery. She denied known history of clotting disorder.   Last cardiology visit with Dr. Bettina Gavia was on 09/11/21. CAD and diastolic CHF felt stable on ASA, ranolazine, statin, loop diuretic, and b-blocker. On midodrine for idiopathic hypotension. On OTC iron for anemia. Six month follow-up planned.  She is on ASA and Plavix. She was not given any specific instructions regarding these by Dr. Redmond Baseman.  Case is posted for dilation. Patient was advised to clarify with surgeon if she could continue anti-platelet therapy perioperatively. I also spoke with Jinny Blossom at Dr. Redmond Baseman' office. She will follow-up with surgeon/staff so they can advised patient as needed.   Anesthesia team to evaluate on the day of surgery.   VS:  BP Readings from Last 3 Encounters:  09/11/21 (!) 92/54  07/17/21 (!) 94/52  06/12/21 (!) 95/56   Pulse Readings from Last 3 Encounters:  09/11/21 (!) 54  07/17/21 (!) 56  06/12/21 (!) 53     PROVIDERS: Jaclynn Major, NP is PCP  Shirlee More, MD is cardiologist Ellouise Newer, MD is neurologist. As needed follow-up of as of 12/01/20 office visit. Allena Katz, MD is allergist. Last visit 07/17/21. Referred to ENT to evaluate pharyngeal esophageal dysphagia.  Thornton Park, MD is GI. S/p esophageal dilatation. As of 03/24/20, normal appearing esophageal lumen, s/p empiric dilation.    LABS: For day of surgery as indicated.   OTHER: Spirometry 07/17/21: FEV1 of 1.95 @ 80 % of predicted. FEV1/FVC = 0.78. Normal respiratory function.  EEG 02/04/20: Impression: This awake EEG is normal.   Clinical Correlation: A normal EEG does not exclude a clinical diagnosis of epilepsy.  If further clinical questions remain, prolonged EEG may be helpful.  Clinical correlation is advised.    IMAGES: CT Chest/abd/pelvis 03/25/20: IMPRESSION: 1. No CT findings for esophageal perforation, pneumomediastinum or retroperitoneal hematoma. No free abdominal/pelvic fluid or air. 2. Mild distal esophageal wall thickening but no mass or intramural gas. 3. Stable surgical changes from coronary artery bypass surgery. 4. Stable age advanced coronary artery calcifications and LAD stent. 5. Status post cholecystectomy and sphincterotomy with pneumobilia. 6. Left renal calculus. 7. Remote  T12 and L2 compression fractures.    EKG: 03/14/21 ([facility]): SB at 59 bpm. LBBB  (old)   CV: Long term ZioXT monitor 03/19/19-03/26/19: - The heart rhythm throughout was sinus and leftbundlebranchblock, with minimum average and maximum heart rates of 50, 64 and 97 bpm. - There were no pauses of 3 seconds or greater and no episodes of second or third-degree AV block or sinus node exit block. - Ventricular ectopy was rare with isolated PVCs - Supraventricular ectopy was rare with isolated APCs and no episodes of atrial fibrillation or flutter. - There were 7 triggered and no diary events.  1 triggered event showed a single PVC in sinus rhythm 2 showed isolated APCs and sinus rhythm and the others sinus rhythm without arrhythmia. Conclusion: left bundle branch block, rare ventricular and supraventricular ectopy.   Cardiac cath 03/05/19: The left ventricular systolic function is normal. The left ventricular ejection fraction is 55-65% by visual estimate. LV end diastolic pressure is normal. With significantly reduced/borderline hypotensive systolic pressures (after sedation) ------------ NATIVE CORONARY ANGIOGRAPHY ----------------- Dist LM lesion is 40% stenosed. Ost LAD lesion is 80% stenosed. Mid LAD- stent is 80% stenosed. Marta Lamas 1st Diag lesion is 60% stenosed. Proximal-mid LAD-1 lesion is 60% stenosed. 1st Mrg lesion is 90% stenosed, just at the anastomosis site of the occluded SVG, but with recanalization of the distal vessel (noted previously been shut down). -------------- GRAFTS ---------------- LIMA-dLAD graft was visualized by angiography and is normal in caliber. The graft exhibits no disease. The distal LAD is free of any significant disease. Retrograde flow which is the previously grafted diagonal SVG-Diag is 100% stenosed at the aortic origin SVG-OM graft was visualized by angiography and is 100% stenosed from the aortic origin to Insertion   SUMMARY Severe two-vessel disease with ostial 80% LAD at the proximal edge of the stent then essentially sub-total  occlusion (90%) after stent with patent LIMA to the LAD, recanalization of major OM branch that had previously been occluded during last PCI-persistent 90% stenosis at previous SVG anastomosis site. Known occluded SVG-Diagl and SVG-OM Widely patent RCA Normal LV function and EDP -with borderline low systolic pressures   RECOMMENDATIONS The patient actually has relatively stable disease from the time of her last MI.  The OM branch has now recanalized with persistent lesion.  This was previously not able to be crossed with a wire leading to occlusion of the vessel downstream.  Given her current stable situation, I felt it prudent to hold off on attempting to rewire this vessel with the risk of a similar undesired outcome. For now the recommendation would be to optimize medical management, if unable to control angina then last option will be to we tried to cross this lesion with the understanding that it could potentially end up causing the distal vessel to occlude. The patient is on 1000 mg twice daily Ranexa -> we will continue to titrate other antianginals as blood pressure tolerates.   Echo 11/25/18: IMPRESSIONS   1. Left ventricular ejection fraction, by visual estimation, is 55 to  60%. The left ventricle has normal function. Normal left ventricular size.  There is mildly increased left ventricular hypertrophy.   2. Elevated mean left atrial pressure.   3. Left ventricular diastolic Doppler parameters are consistent with  impaired relaxation pattern of LV diastolic filling.   4. Global right ventricle has mildly reduced systolic function.The right  ventricular size is normal. No increase in right ventricular wall  thickness.   5. Left atrial  size was mildly dilated.   6. Right atrial size was normal.   7. The mitral valve is abnormal. Mild to moderate mitral valve  regurgitation.   8. The tricuspid valve is abnormal. Tricuspid valve regurgitation  mild-moderate.   9. Normal pulmonary  artery systolic pressure. RVSP 28 mmHg  10. The inferior vena cava is normal in size with <50% respiratory  variability, suggesting right atrial pressure of 8 mmHg.  11. Abnormal septal motion consistent with left bundle branch block.    US Carotid 03/24/17: Final Interpretation:  - Right Carotid: Velocities in the right ICA are consistent with a 1-39%  stenosis.  - Left Carotid: Velocities in the left ICA are consistent with a 1-39%  stenosis.  - Vertebrals: Both vertebral arteries were patent with antegrade flow.      Past Medical History:  Diagnosis Date   Abdominal pain 04/08/2017   Abnormal abdominal CT scan 05/14/2019   Acute ST elevation myocardial infarction (STEMI) of inferolateral wall (HCC) 11/24/2018   Anemia 05/14/2019   Anxiety 05/14/2019   Arthritis    Asthma    CHF (congestive heart failure) ([place])    Chronic bronchitis ([place])    "usually get it q yr" (11/05/2016)   Chronic diastolic heart failure ([place]) 04/17/2017   Constipation 05/14/2019   Coronary artery disease    Coronary artery disease involving native coronary artery of native heart with unstable angina pectoris ([place])    Coronary artery disease involving native coronary artery with angina pectoris ([place]) 04/08/2017   Dyssynergic defecation    Esophageal perforation    Fecal impaction ([place]) 05/14/2019   GERD (gastroesophageal reflux disease)    Headache    "weekly" (11/05/2016)   Heart murmur    "dx'd when I was a child"   Hematemesis without nausea    Hepatitis 1991   "when I had my gallbladder out" (11/05/2016)   History of blood transfusion 1985   S/P childbirth   History of kidney stones    History of peptic ulcer 05/14/2019   HLD (hyperlipidemia) 11/27/2018   HTN (hypertension) 05/14/2019   Hypokalemia 04/17/2017   Iron deficiency anemia 03/07/2019   Irritable bowel syndrome 05/14/2019   Kidney stones    LBBB (left bundle branch block) 11/05/2016   NSTEMI (non-ST elevated myocardial  infarction) ([place]) 11/05/2016   9/18 PCI/DESx1 to p/mLAD   Orthostatic hypotension    Osteoporosis    Pancreatitis 1991   Pericardial effusion 04/08/2017   Pharyngoesophageal dysphagia 08/08/2021   Pneumonia    "twice" (11/05/2016)   Psoriasis    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   Seizures ([place])    last one in her 20's, none since   Severe mitral regurgitation 05/14/2019   Syncope 05/14/2019   Therapeutic opioid induced constipation 05/14/2019   Throat pain 08/08/2021   Ulcer of esophagus with bleeding    Unstable angina ([place]) 03/04/2019   Upper GI bleed 03/25/2020    Past Surgical History:  Procedure Laterality Date   ABDOMINAL HYSTERECTOMY  1990   ANAL RECTAL MANOMETRY N/A 10/26/2020   Procedure: ANO RECTAL MANOMETRY;  Surgeon: Thornton Park, MD;  Location: [facility];  Service: Gastroenterology;  Laterality: N/A;   ANKLE FRACTURE SURGERY  2021   left   APPENDECTOMY  1992   BACK SURGERY     BILE DUCT EXPLORATION  1991   "reconstruction"   [place]  11/05/2016   CORONARY ARTERY BYPASS GRAFT N/A 03/26/2017   Procedure: CORONARY ARTERY BYPASS GRAFTING (CABG) x 3 WITH ENDOSCOPIC HARVESTING OF RIGHT SAPHENOUS VEIN;  Surgeon: Ivin Poot, MD;  Location: [place];  Service: Open Heart Surgery;  Laterality: N/A;   CORONARY STENT INTERVENTION N/A 11/05/2016   Procedure: CORONARY STENT INTERVENTION;  Surgeon: Martinique, Peter M, MD;  Location: [place] [facility];  Service: Cardiovascular;  Laterality: N/A;   CYSTOSCOPY WITH STENT PLACEMENT  "3 times" (11/05/2016)   DILATION AND CURETTAGE OF UTERUS  1989   ESOPHAGOGASTRODUODENOSCOPY (EGD) WITH PROPOFOL N/A 03/25/2020   Procedure: ESOPHAGOGASTRODUODENOSCOPY (EGD) WITH PROPOFOL;  Surgeon: Yetta Flock, MD;  Location: [place];  Service: Gastroenterology;  Laterality: N/A;   HEMOSTASIS CLIP PLACEMENT  03/25/2020   Procedure:  HEMOSTASIS CLIP PLACEMENT;  Surgeon: Yetta Flock, MD;  Location: [place];  Service: Gastroenterology;;   INTRAVASCULAR ULTRASOUND/IVUS N/A 03/22/2017   Procedure: Intravascular Ultrasound/IVUS;  Surgeon: Martinique, Peter M, MD;  Location: [place] [facility];  Service: Cardiovascular;  Laterality: N/A;   KNEE ARTHROSCOPY Right 2000s   LEFT HEART CATH AND CORONARY ANGIOGRAPHY N/A 11/05/2016   Procedure: LEFT HEART CATH AND CORONARY ANGIOGRAPHY;  Surgeon: Martinique, Peter M, MD;  Location: [place] [facility];  Service: Cardiovascular;  Laterality: N/A;   LEFT HEART CATH AND CORONARY ANGIOGRAPHY N/A 03/22/2017   Procedure: LEFT HEART CATH AND CORONARY ANGIOGRAPHY;  Surgeon: Martinique, Peter M, MD;  Location: [place] [facility];  Service: Cardiovascular;  Laterality: N/A;   LEFT HEART CATH AND CORS/GRAFTS ANGIOGRAPHY N/A 03/05/2019   Procedure: LEFT HEART CATH AND CORS/GRAFTS ANGIOGRAPHY;  Surgeon: Leonie Man, MD;  Location: [place] [facility];  Service: Cardiovascular;  Laterality: N/A;   NASAL SINUS SURGERY  ~ 2009   "exposed to mildew; had to have the infectoin drained; cut the outside of my face"   OVARIAN CYST SURGERY  X 6   RIGHT/LEFT HEART CATH AND CORONARY/GRAFT ANGIOGRAPHY N/A 11/24/2018   Procedure: RIGHT/LEFT HEART CATH AND CORONARY/GRAFT ANGIOGRAPHY;  Surgeon: Leonie Man, MD;  Location: [place] [facility];  Service: Cardiovascular;  Laterality: N/A;   TEE WITHOUT CARDIOVERSION N/A 03/26/2017   Procedure: TRANSESOPHAGEAL ECHOCARDIOGRAM (TEE);  Surgeon: Prescott Gum, [name], MD;  Location: [place];  Service: Open Heart Surgery;  Laterality: N/A;   UPPER GASTROINTESTINAL ENDOSCOPY      MEDICATIONS: No current facility-administered medications for this encounter.    albuterol (VENTOLIN HFA) 108 (90 Base) MCG/ACT inhaler   aspirin EC 81 MG tablet   atorvastatin (LIPITOR) 80 MG tablet   busPIRone (BUSPAR) 10 MG tablet   cetirizine (ZYRTEC) 10 MG tablet   clopidogrel (PLAVIX)  75 MG tablet   DULoxetine (CYMBALTA) 20 MG capsule   famotidine (PEPCID) 20 MG tablet   furosemide (LASIX) 40 MG tablet   gabapentin (NEURONTIN) 100 MG capsule   Iron Carbonyl-Vitamin C-FOS (CHEWABLE IRON PO)   metoprolol tartrate (LOPRESSOR) 25 MG tablet   midodrine (PROAMATINE) 10 MG tablet   montelukast (SINGULAIR) 10 MG tablet   Multiple Vitamin (MULTIVITAMIN WITH MINERALS) TABS tablet   nitroGLYCERIN (NITROSTAT) 0.4 MG SL tablet   ondansetron (ZOFRAN) 4 MG tablet   pantoprazole (PROTONIX) 40 MG tablet   Potassium Chloride ER 20 MEQ TBCR   ranolazine (RANEXA) 1000 MG SR tablet   rOPINIRole (REQUIP) 0.5 MG tablet   sertraline (ZOLOFT) 50 MG tablet   TALTZ 80 MG/ML SOSY   traZODone (DESYREL) 100 MG tablet   trazodone (DESYREL)  300 MG tablet   AMITIZA 24 MCG capsule   dicyclomine (BENTYL) 10 MG capsule   mupirocin ointment (BACTROBAN) 2 %    Myra Gianotti, PA-C Surgical Short Stay/Anesthesiology [facility] Phone [phone] [facility] Phone [phone] 10/03/2021 2:59 PM

## 2021-10-03 NOTE — Anesthesia Preprocedure Evaluation (Signed)
Anesthesia Evaluation    Airway        Dental   Pulmonary           Cardiovascular hypertension,      Neuro/Psych    GI/Hepatic   Endo/Other    Renal/GU      Musculoskeletal   Abdominal   Peds  Hematology   Anesthesia Other Findings   Reproductive/Obstetrics                             Anesthesia Physical Anesthesia Plan  ASA:   Anesthesia Plan:    Post-op Pain Management:    Induction:   PONV Risk Score and Plan:   Airway Management Planned:   Additional Equipment:   Intra-op Plan:   Post-operative Plan:   Informed Consent:   Plan Discussed with:   Anesthesia Plan Comments: (PAT note written 10/03/2021 by Myra Gianotti, PA-C. )        Anesthesia Quick Evaluation

## 2021-10-04 ENCOUNTER — Ambulatory Visit (HOSPITAL_COMMUNITY): Admission: RE | Admit: 2021-10-04 | Payer: Medicaid Other | Source: Home / Self Care | Admitting: Otolaryngology

## 2021-10-04 SURGERY — ESOPHAGOSCOPY, RIGID
Anesthesia: General | Laterality: Bilateral

## 2021-10-06 ENCOUNTER — Encounter (HOSPITAL_COMMUNITY): Payer: Self-pay | Admitting: Otolaryngology

## 2021-10-06 NOTE — Progress Notes (Signed)
I spoke with Ms Gabrielle Page who reports that surgery was  not done last week because she was still on Plavix and Aspirin. Ms Gabrielle Page reports that ASA and Plavix have now been on hold since 10/04/21. I confirmed with Ms Gabrielle Page of arrival time of 1030 on Monday and to stop clear liquids by 1015.

## 2021-10-09 ENCOUNTER — Encounter (HOSPITAL_COMMUNITY)
Admission: RE | Disposition: A | Discharge status: Home / Self Care | Payer: Self-pay | Source: Ambulatory Visit | Attending: Otolaryngology

## 2021-10-09 ENCOUNTER — Ambulatory Visit (HOSPITAL_BASED_OUTPATIENT_CLINIC_OR_DEPARTMENT_OTHER): Payer: Medicaid Other | Admitting: Vascular Surgery

## 2021-10-09 ENCOUNTER — Other Ambulatory Visit: Payer: Self-pay

## 2021-10-09 ENCOUNTER — Ambulatory Visit (HOSPITAL_COMMUNITY)
Admission: RE | Admit: 2021-10-09 | Discharge: 2021-10-09 | Disposition: A | Discharge status: Home / Self Care | Payer: Medicaid Other | Source: Ambulatory Visit | Attending: Otolaryngology | Admitting: Otolaryngology

## 2021-10-09 ENCOUNTER — Ambulatory Visit (HOSPITAL_COMMUNITY): Payer: Medicaid Other | Admitting: Vascular Surgery

## 2021-10-09 ENCOUNTER — Encounter (HOSPITAL_COMMUNITY): Payer: Self-pay | Admitting: Otolaryngology

## 2021-10-09 DIAGNOSIS — J449 Chronic obstructive pulmonary disease, unspecified: Secondary | ICD-10-CM

## 2021-10-09 DIAGNOSIS — R1314 Dysphagia, pharyngoesophageal phase: Secondary | ICD-10-CM | POA: Insufficient documentation

## 2021-10-09 DIAGNOSIS — F419 Anxiety disorder, unspecified: Secondary | ICD-10-CM | POA: Insufficient documentation

## 2021-10-09 DIAGNOSIS — Z955 Presence of coronary angioplasty implant and graft: Secondary | ICD-10-CM | POA: Insufficient documentation

## 2021-10-09 DIAGNOSIS — Z79899 Other long term (current) drug therapy: Secondary | ICD-10-CM | POA: Insufficient documentation

## 2021-10-09 DIAGNOSIS — I252 Old myocardial infarction: Secondary | ICD-10-CM | POA: Diagnosis not present

## 2021-10-09 DIAGNOSIS — Z951 Presence of aortocoronary bypass graft: Secondary | ICD-10-CM | POA: Insufficient documentation

## 2021-10-09 DIAGNOSIS — I25119 Atherosclerotic heart disease of native coronary artery with unspecified angina pectoris: Secondary | ICD-10-CM | POA: Diagnosis not present

## 2021-10-09 DIAGNOSIS — K222 Esophageal obstruction: Secondary | ICD-10-CM | POA: Insufficient documentation

## 2021-10-09 DIAGNOSIS — R070 Pain in throat: Secondary | ICD-10-CM

## 2021-10-09 DIAGNOSIS — I110 Hypertensive heart disease with heart failure: Secondary | ICD-10-CM | POA: Diagnosis not present

## 2021-10-09 DIAGNOSIS — I509 Heart failure, unspecified: Secondary | ICD-10-CM | POA: Insufficient documentation

## 2021-10-09 DIAGNOSIS — K219 Gastro-esophageal reflux disease without esophagitis: Secondary | ICD-10-CM | POA: Diagnosis not present

## 2021-10-09 DIAGNOSIS — D649 Anemia, unspecified: Secondary | ICD-10-CM | POA: Insufficient documentation

## 2021-10-09 HISTORY — PX: RIGID ESOPHAGOSCOPY: SHX5226

## 2021-10-09 HISTORY — DX: Personal history of other diseases of the digestive system: Z87.19

## 2021-10-09 LAB — CBC
HCT: 32.7 % — ABNORMAL LOW (ref 36.0–46.0)
Hemoglobin: 11.2 g/dL — ABNORMAL LOW (ref 12.0–15.0)
MCH: 32.3 pg (ref 26.0–34.0)
MCHC: 34.3 g/dL (ref 30.0–36.0)
MCV: 94.2 fL (ref 80.0–100.0)
Platelets: 243 10*3/uL (ref 150–400)
RBC: 3.47 MIL/uL — ABNORMAL LOW (ref 3.87–5.11)
RDW: 12.5 % (ref 11.5–15.5)
WBC: 5.1 10*3/uL (ref 4.0–10.5)
nRBC: 0 % (ref 0.0–0.2)

## 2021-10-09 LAB — COMPREHENSIVE METABOLIC PANEL
ALT: 17 U/L (ref 0–44)
AST: 21 U/L (ref 15–41)
Albumin: 4.2 g/dL (ref 3.5–5.0)
Alkaline Phosphatase: 33 U/L — ABNORMAL LOW (ref 38–126)
Anion gap: 8 (ref 5–15)
BUN: 18 mg/dL (ref 6–20)
CO2: 25 mmol/L (ref 22–32)
Calcium: 9 mg/dL (ref 8.9–10.3)
Chloride: 105 mmol/L (ref 98–111)
Creatinine, Ser: 0.99 mg/dL (ref 0.44–1.00)
GFR, Estimated: >60 mL/min (ref >60)
Glucose, Bld: 87 mg/dL (ref 70–99)
Potassium: 3.5 mmol/L (ref 3.5–5.1)
Sodium: 138 mmol/L (ref 135–145)
Total Bilirubin: 0.6 mg/dL (ref 0.3–1.2)
Total Protein: 6.6 g/dL (ref 6.5–8.1)

## 2021-10-09 LAB — SURGICAL PCR SCREEN
MRSA, PCR: NEGATIVE
Staphylococcus aureus: NEGATIVE

## 2021-10-09 SURGERY — ESOPHAGOSCOPY, RIGID
Anesthesia: General | Laterality: Bilateral

## 2021-10-09 MED ORDER — CHLORHEXIDINE GLUCONATE 0.12 % MT SOLN
15.0000 mL | Freq: Once | OROMUCOSAL | Status: AC
  Administered 2021-10-09: 15 mL via OROMUCOSAL
  Filled 2021-10-09: qty 15

## 2021-10-09 MED ORDER — PROPOFOL 10 MG/ML IV BOLUS
INTRAVENOUS | Status: DC | PRN
  Administered 2021-10-09: 150 mg via INTRAVENOUS

## 2021-10-09 MED ORDER — LIDOCAINE 2% (20 MG/ML) 5 ML SYRINGE
INTRAMUSCULAR | Status: DC | PRN
  Administered 2021-10-09: 20 mg via INTRAVENOUS

## 2021-10-09 MED ORDER — ONDANSETRON HCL 4 MG/2ML IJ SOLN
INTRAMUSCULAR | Status: AC
Start: 2021-10-09 — End: ?
  Filled 2021-10-09: qty 2

## 2021-10-09 MED ORDER — CEFAZOLIN SODIUM-DEXTROSE 2-4 GM/100ML-% IV SOLN
2.0000 g | INTRAVENOUS | Status: AC
  Administered 2021-10-09: 2 g via INTRAVENOUS
  Filled 2021-10-09: qty 100

## 2021-10-09 MED ORDER — MIDAZOLAM HCL 2 MG/2ML IJ SOLN
INTRAMUSCULAR | Status: AC
Start: 2021-10-09 — End: ?
  Filled 2021-10-09: qty 2

## 2021-10-09 MED ORDER — ONDANSETRON HCL 4 MG/2ML IJ SOLN
INTRAMUSCULAR | Status: DC | PRN
  Administered 2021-10-09: 4 mg via INTRAVENOUS

## 2021-10-09 MED ORDER — LIDOCAINE 2% (20 MG/ML) 5 ML SYRINGE
INTRAMUSCULAR | Status: AC
  Filled 2021-10-09: qty 5

## 2021-10-09 MED ORDER — DEXAMETHASONE SODIUM PHOSPHATE 10 MG/ML IJ SOLN
INTRAMUSCULAR | Status: DC | PRN
  Administered 2021-10-09: 5 mg via INTRAVENOUS

## 2021-10-09 MED ORDER — OXYCODONE HCL 5 MG PO TABS: 5.0000 mg | ORAL_TABLET | Freq: Once | ORAL | Status: DC | PRN

## 2021-10-09 MED ORDER — FENTANYL CITRATE (PF) 100 MCG/2ML IJ SOLN: 25.0000 ug | INTRAMUSCULAR | Status: DC | PRN

## 2021-10-09 MED ORDER — LACTATED RINGERS IV SOLN
INTRAVENOUS | Status: DC
Start: 2021-10-09 — End: 2021-10-09

## 2021-10-09 MED ORDER — MEPERIDINE HCL 25 MG/ML IJ SOLN: 6.2500 mg | INTRAMUSCULAR | Status: DC | PRN

## 2021-10-09 MED ORDER — OXYCODONE HCL 5 MG/5ML PO SOLN: 5.0000 mg | Freq: Once | ORAL | Status: DC | PRN

## 2021-10-09 MED ORDER — SUCCINYLCHOLINE 20MG/ML (10ML) SYRINGE FOR MEDFUSION PUMP - OPTIME
INTRAMUSCULAR | Status: DC | PRN
  Administered 2021-10-09: 140 mg via INTRAVENOUS

## 2021-10-09 MED ORDER — FENTANYL CITRATE (PF) 250 MCG/5ML IJ SOLN
INTRAMUSCULAR | Status: AC
  Filled 2021-10-09: qty 5

## 2021-10-09 MED ORDER — ORAL CARE MOUTH RINSE: 15.0000 mL | Freq: Once | OROMUCOSAL | Status: AC

## 2021-10-09 MED ORDER — ACETAMINOPHEN 500 MG PO TABS
1000.0000 mg | ORAL_TABLET | Freq: Once | ORAL | Status: AC
  Administered 2021-10-09: 1000 mg via ORAL
  Filled 2021-10-09: qty 2

## 2021-10-09 MED ORDER — PROPOFOL 10 MG/ML IV BOLUS
INTRAVENOUS | Status: AC
  Filled 2021-10-09: qty 20

## 2021-10-09 MED ORDER — MIDAZOLAM HCL 2 MG/2ML IJ SOLN
INTRAMUSCULAR | Status: DC | PRN
  Administered 2021-10-09: 2 mg via INTRAVENOUS

## 2021-10-09 MED ORDER — MIDAZOLAM HCL 2 MG/2ML IJ SOLN: 0.5000 mg | Freq: Once | INTRAMUSCULAR | Status: DC | PRN

## 2021-10-09 MED ORDER — DEXAMETHASONE SODIUM PHOSPHATE 10 MG/ML IJ SOLN
INTRAMUSCULAR | Status: AC
  Filled 2021-10-09: qty 1

## 2021-10-09 MED ORDER — FENTANYL CITRATE (PF) 250 MCG/5ML IJ SOLN
INTRAMUSCULAR | Status: DC | PRN
Start: 2021-10-09 — End: 2021-10-09
  Administered 2021-10-09: 50 ug via INTRAVENOUS

## 2021-10-09 SURGICAL SUPPLY — 24 items
BAG COUNTER SPONGE SURGICOUNT (BAG) ×1 IMPLANT
BLADE SURG 15 STRL LF DISP TIS (BLADE) IMPLANT
BLADE SURG 15 STRL SS (BLADE)
CANISTER SUCT 3000ML PPV (MISCELLANEOUS) ×1 IMPLANT
COVER BACK TABLE 60X90IN (DRAPES) ×1 IMPLANT
COVER MAYO STAND STRL (DRAPES) ×1 IMPLANT
DRAPE HALF SHEET 40X57 (DRAPES) ×1 IMPLANT
GAUZE SPONGE 4X4 12PLY STRL (GAUZE/BANDAGES/DRESSINGS) ×1 IMPLANT
GLOVE BIO SURGEON STRL SZ7.5 (GLOVE) ×1 IMPLANT
GOWN STRL REUS W/ TWL LRG LVL3 (GOWN DISPOSABLE) ×2 IMPLANT
GOWN STRL REUS W/TWL LRG LVL3 (GOWN DISPOSABLE) ×2
KIT BASIN OR (CUSTOM PROCEDURE TRAY) ×1 IMPLANT
KIT TURNOVER KIT B (KITS) ×1 IMPLANT
NDL HYPO 25GX1X1/2 BEV (NEEDLE) IMPLANT
NEEDLE HYPO 25GX1X1/2 BEV (NEEDLE) IMPLANT
NS IRRIG 1000ML POUR BTL (IV SOLUTION) ×1 IMPLANT
PAD ARMBOARD 7.5X6 YLW CONV (MISCELLANEOUS) ×2 IMPLANT
PATTIES SURGICAL .5 X3 (DISPOSABLE) IMPLANT
POSITIONER HEAD DONUT 9IN (MISCELLANEOUS) IMPLANT
SOL ANTI FOG 6CC (MISCELLANEOUS) ×1 IMPLANT
SOLUTION ANTI FOG 6CC (MISCELLANEOUS) ×1
SURGILUBE 2OZ TUBE FLIPTOP (MISCELLANEOUS) ×1 IMPLANT
TOWEL GREEN STERILE FF (TOWEL DISPOSABLE) ×1 IMPLANT
TUBE CONNECTING 12X1/4 (SUCTIONS) ×1 IMPLANT

## 2021-10-09 NOTE — Anesthesia Postprocedure Evaluation (Signed)
Anesthesia Post Note  Patient: Gabrielle Page  Procedure(s) Performed: RIGID ESOPHAGOSCOPY WITH DILATION (Bilateral)     Patient location during evaluation: PACU Anesthesia Type: General Level of consciousness: awake and alert, patient cooperative and oriented Pain management: pain level controlled Vital Signs Assessment: post-procedure vital signs reviewed and stable Respiratory status: spontaneous breathing, nonlabored ventilation and respiratory function stable Cardiovascular status: blood pressure returned to baseline and stable Postop Assessment: no apparent nausea or vomiting and adequate PO intake Anesthetic complications: no   No notable events documented.  Last Vitals:  Vitals:   10/09/21 1440 10/09/21 1455  BP: (!) 91/53 (!) 91/53  Pulse: (!) 50 (!) 52  Resp: 15 13  Temp:  36.4 C  SpO2: 100% 98%    Last Pain:  Vitals:   10/09/21 1455  TempSrc:   PainSc: 0-No pain                 Curlee Bogan,E. Blanche Scovell

## 2021-10-09 NOTE — Anesthesia Preprocedure Evaluation (Addendum)
Anesthesia Evaluation  Patient identified by MRN, date of birth, ID band Patient awake    Reviewed: Allergy & Precautions, NPO status , Patient's Chart, lab work & pertinent test results, reviewed documented beta blocker date and time   History of Anesthesia Complications Negative for: history of anesthetic complications  Airway Mallampati: I  TM Distance: >3 FB Neck ROM: Full    Dental  (+) Edentulous Upper, Edentulous Lower   Pulmonary asthma , COPD,  COPD inhaler,    breath sounds clear to auscultation       Cardiovascular hypertension, Pt. on medications and Pt. on home beta blockers + angina with exertion + CAD, + Past MI, + Cardiac Stents and + CABG  + Valvular Problems/Murmurs MR  Rhythm:Regular Rate:Normal  '20 ECHO: EF 55-60%. The LV has normal function,  mildly increased left ventricular hypertrophy, impaired relaxation pattern of LV diastolic filling, RV has mildly reduced systolic function, Mild-mod MR, mild-mod TR   Neuro/Psych  Headaches, Anxiety    GI/Hepatic hiatal hernia, GERD  Medicated and Controlled,(+) Hepatitis -  Endo/Other  negative endocrine ROS  Renal/GU Renal InsufficiencyRenal disease     Musculoskeletal  (+) Arthritis ,   Abdominal   Peds  Hematology  (+) Blood dyscrasia (Hb 11.2), anemia ,   Anesthesia Other Findings   Reproductive/Obstetrics                            Anesthesia Physical Anesthesia Plan  ASA: 3  Anesthesia Plan: General   Post-op Pain Management: Tylenol PO (pre-op)*   Induction: Intravenous  PONV Risk Score and Plan: 3 and Ondansetron, Dexamethasone and Treatment may vary due to age or medical condition  Airway Management Planned: Oral ETT  Additional Equipment: None  Intra-op Plan:   Post-operative Plan: Extubation in OR  Informed Consent: I have reviewed the patients History and Physical, chart, labs and discussed the procedure  including the risks, benefits and alternatives for the proposed anesthesia with the patient or authorized representative who has indicated his/her understanding and acceptance.       Plan Discussed with: CRNA and Surgeon  Anesthesia Plan Comments:        Anesthesia Quick Evaluation

## 2021-10-09 NOTE — Op Note (Signed)
Preop diagnosis: Dysphagia Postop diagnosis: same Procedure: Rigid esophagoscopy and dilation Surgeon: Redmond Baseman Anesth: General endotracheal anesthesia Compl: None Indications: The patient is a 61 year old female with a history of dysphagia who underwent barium swallow suggesting an upper esophageal narrowing.  She presents for surgical evaluation and biopsy. Findings: There was no evidence of narrowing of the esophagus in the upper third of the esophagus.  A 44 Fr. Dilator was easily passed. Description:  After discussing risks, benefits, and alternatives, the patient was brought to the operating room and placed on the operative table in the supine position.  Anesthesia was induced and the patient was intubated by the anesthesia team without difficulty.  The eyes were taped closed and the bed was turned 90 degrees from anesthesia.  A damp gauze was placed over the upper gum.  A rigid cervical esophagoscope was inserted and passed down the esophagus keeping the lumen in view down to the mid esophagus.  The scope was then slowly backed out while evaluating the esophagus.  A 44 Fr Maloney dilator was passed without difficulty and then removed.  The upper esophagus was again viewed with the cervical esophagoscope.  After completion, the esophagoscope was removed.  The patient was then turned back to anesthesia for wake up and was extubated and moved to the recovery room in stable condition.

## 2021-10-09 NOTE — H&P (Signed)
Gabrielle Page is an 61 y.o. female.   Chief Complaint: Dysphagia HPI: 61 year old female with couple of years of difficulty swallowing found to have an upper esophageal narrowing by barium swallow and presents for dilation.  Past Medical History:  Diagnosis Date   Abdominal pain 04/08/2017   Abnormal abdominal CT scan 05/14/2019   Acute ST elevation myocardial infarction (STEMI) of inferolateral wall (HCC) 11/24/2018   Anemia 05/14/2019   Anxiety 05/14/2019   Arthritis    Asthma    CHF (congestive heart failure) ([place])    Chronic bronchitis ([place])    "usually get it q yr" (11/05/2016)   Chronic diastolic heart failure ([place]) 04/17/2017   Constipation 05/14/2019   Coronary artery disease    Coronary artery disease involving native coronary artery of native heart with unstable angina pectoris ([place])    Coronary artery disease involving native coronary artery with angina pectoris ([place]) 04/08/2017   Dyssynergic defecation    Esophageal perforation    Fecal impaction ([place]) 05/14/2019   GERD (gastroesophageal reflux disease)    Headache    "weekly" (11/05/2016)   Heart murmur    "dx'd when I was a child"   Hematemesis without nausea    Hepatitis 1991   "when I had my gallbladder out" (11/05/2016)   History of blood transfusion 1985   S/P childbirth   History of hiatal hernia    History of kidney stones    History of peptic ulcer 05/14/2019   HLD (hyperlipidemia) 11/27/2018   HTN (hypertension) 05/14/2019   Hypokalemia 04/17/2017   Iron deficiency anemia 03/07/2019   Irritable bowel syndrome 05/14/2019   Kidney stones    LBBB (left bundle branch block) 11/05/2016   NSTEMI (non-ST elevated myocardial infarction) ([place]) 11/05/2016   9/18 PCI/DESx1 to p/mLAD   Orthostatic hypotension    Osteoporosis    Pancreatitis 1991   Pericardial effusion 04/08/2017   Pharyngoesophageal dysphagia 08/08/2021   Pneumonia    "twice" (11/05/2016)   Psoriasis    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    Seizures ([place])    last one in her 20's, none since   Severe mitral regurgitation 05/14/2019   Syncope 05/14/2019   Therapeutic opioid induced constipation 05/14/2019   Throat pain 08/08/2021   Ulcer of esophagus with bleeding    Unstable angina ([place]) 03/04/2019   Upper GI bleed 03/25/2020    Past Surgical History:  Procedure Laterality Date   ABDOMINAL HYSTERECTOMY  1990   ANAL RECTAL MANOMETRY N/A 10/26/2020   Procedure: ANO RECTAL MANOMETRY;  Surgeon: Thornton Park, MD;  Location: [facility];  Service: Gastroenterology;  Laterality: N/A;   ANKLE FRACTURE SURGERY  2021   left   APPENDECTOMY  1992   BACK SURGERY     BILE DUCT EXPLORATION  1991   "reconstruction"   [place]   COLONOSCOPY     CORONARY ANGIOPLASTY WITH STENT PLACEMENT  11/05/2016   CORONARY ARTERY BYPASS GRAFT N/A 03/26/2017   Procedure: CORONARY ARTERY BYPASS GRAFTING (CABG) x 3 WITH ENDOSCOPIC HARVESTING OF RIGHT SAPHENOUS VEIN;  Surgeon: Ivin Poot, MD;  Location: [facility];  Service: Open Heart Surgery;  Laterality: N/A;   CORONARY STENT INTERVENTION N/A 11/05/2016   Procedure: CORONARY STENT INTERVENTION;  Surgeon: Martinique, Peter M, MD;  Location: [facility];  Service: Cardiovascular;  Laterality: N/A;   CYSTOSCOPY WITH STENT PLACEMENT  "3 times" (11/05/2016)   [place]  ESOPHAGOGASTRODUODENOSCOPY (EGD) WITH PROPOFOL N/A 03/25/2020   Procedure: ESOPHAGOGASTRODUODENOSCOPY (EGD) WITH PROPOFOL;  Surgeon: Yetta Flock, MD;  Location: [facility];  Service: Gastroenterology;  Laterality: N/A;   HEMOSTASIS CLIP PLACEMENT  03/25/2020   Procedure: HEMOSTASIS CLIP PLACEMENT;  Surgeon: Yetta Flock, MD;  Location: [facility];  Service: Gastroenterology;;   INTRAVASCULAR ULTRASOUND/IVUS N/A 03/22/2017   Procedure: Intravascular Ultrasound/IVUS;  Surgeon: Martinique, Peter M, MD;  Location: [facility];  Service:  Cardiovascular;  Laterality: N/A;   KNEE ARTHROSCOPY Right 2000s   LEFT HEART CATH AND CORONARY ANGIOGRAPHY N/A 11/05/2016   Procedure: LEFT HEART CATH AND CORONARY ANGIOGRAPHY;  Surgeon: Martinique, Peter M, MD;  Location: [facility];  Service: Cardiovascular;  Laterality: N/A;   LEFT HEART CATH AND CORONARY ANGIOGRAPHY N/A 03/22/2017   Procedure: LEFT HEART CATH AND CORONARY ANGIOGRAPHY;  Surgeon: Martinique, Peter M, MD;  Location: [facility];  Service: Cardiovascular;  Laterality: N/A;   LEFT HEART CATH AND CORS/GRAFTS ANGIOGRAPHY N/A 03/05/2019   Procedure: LEFT HEART CATH AND CORS/GRAFTS ANGIOGRAPHY;  Surgeon: Leonie Man, MD;  Location: [facility];  Service: Cardiovascular;  Laterality: N/A;   NASAL SINUS SURGERY  ~ 2009   "exposed to mildew; had to have the infectoin drained; cut the outside of my face"   OVARIAN CYST SURGERY  X 6   RIGHT/LEFT HEART CATH AND CORONARY/GRAFT ANGIOGRAPHY N/A 11/24/2018   Procedure: RIGHT/LEFT HEART CATH AND CORONARY/GRAFT ANGIOGRAPHY;  Surgeon: Leonie Man, MD;  Location: [facility];  Service: Cardiovascular;  Laterality: N/A;   TEE WITHOUT CARDIOVERSION N/A 03/26/2017   Procedure: TRANSESOPHAGEAL ECHOCARDIOGRAM (TEE);  Surgeon: Prescott Gum, [name], MD;  Location: [facility];  Service: Open Heart Surgery;  Laterality: N/A;   UPPER GASTROINTESTINAL ENDOSCOPY      Family History  Problem Relation Age of Onset   Suicidality Mother    Hypertension Father    Alcohol abuse Father    Heart attack Father    Diabetes Mellitus II Father    Asthma Sister    Breast cancer Sister    Diabetes Maternal Grandmother    Colon cancer Paternal Grandfather    Colon polyps Paternal Grandfather    Esophageal cancer Neg Hx    Rectal cancer Neg Hx    Stomach cancer Neg Hx    Social History:  reports that she has never smoked. She has never used smokeless tobacco. She reports current alcohol use. She reports that she does not use  drugs.  Allergies:  Allergies  Allergen Reactions   Amiodarone Diarrhea and Nausea And Vomiting    In combination with colchicine   Colchicine Diarrhea and Nausea And Vomiting    Pt states she had rnx to medication; severe V/D   Compazine [Prochlorperazine Edisylate] Other (See Comments)    SEIZURE   Other Other (See Comments) and Cough    Steroids cause uncontrollable coughing    Medications Prior to Admission  Medication Sig Dispense Refill   atorvastatin (LIPITOR) 80 MG tablet Take 1 tablet (80 mg total) by mouth daily at 6 PM. (Patient taking differently: Take 80 mg by mouth at bedtime.) 30 tablet 6   busPIRone (BUSPAR) 10 MG tablet Take 10 mg by mouth 3 (three) times daily.     cetirizine (ZYRTEC) 10 MG tablet Take 10 mg by mouth daily.     DULoxetine (CYMBALTA) 20 MG capsule Take 20 mg by mouth daily.     famotidine (PEPCID) 20 MG  tablet Take 1 tablet (20 mg total) by mouth 2 (two) times daily. 60 tablet 3   furosemide (LASIX) 40 MG tablet TAKE 1 TABLET BY MOUTH 3 TIMES A WEEK 45 tablet 3   gabapentin (NEURONTIN) 100 MG capsule Take 100 mg by mouth 2 (two) times daily.     Iron Carbonyl-Vitamin C-FOS (CHEWABLE IRON PO) Take 1 tablet by mouth daily.     metoprolol tartrate (LOPRESSOR) 25 MG tablet Take 1 tablet (25 mg total) by mouth 2 (two) times daily. 180 tablet 3   midodrine (PROAMATINE) 10 MG tablet Take 1 tablet (10 mg total) by mouth 4 (four) times daily. 360 tablet 2   montelukast (SINGULAIR) 10 MG tablet Take 1 tablet (10 mg total) by mouth at bedtime. 30 tablet 5   Multiple Vitamin (MULTIVITAMIN WITH MINERALS) TABS tablet Take 1 tablet by mouth daily.     ondansetron (ZOFRAN) 4 MG tablet Take 1 tablet (4 mg total) by mouth every 8 (eight) hours as needed for nausea or vomiting. 30 tablet 3   pantoprazole (PROTONIX) 40 MG tablet Take 1 tablet (40 mg total) by mouth 2 (two) times daily. 60 tablet 2   Potassium Chloride ER 20 MEQ TBCR Take 20 mEq by mouth 3 (three) times a  week.     ranolazine (RANEXA) 1000 MG SR tablet Take 1 tablet by mouth twice daily 180 tablet 1   rOPINIRole (REQUIP) 0.5 MG tablet Take 0.5 mg by mouth at bedtime.     sertraline (ZOLOFT) 50 MG tablet Take 50 mg by mouth daily.     TALTZ 80 MG/ML SOSY Inject 80 mg into the skin every 30 (thirty) days.     traZODone (DESYREL) 100 MG tablet Take 100 mg by mouth daily.     trazodone (DESYREL) 300 MG tablet Take 300 mg by mouth at bedtime.     albuterol (VENTOLIN HFA) 108 (90 Base) MCG/ACT inhaler Inhale 2 puffs into the lungs every 6 (six) hours as needed for wheezing or shortness of breath.     AMITIZA 24 MCG capsule TAKE 1 CAPSULE BY MOUTH TWICE DAILY WITH A MEAL (Patient not taking: Reported on 09/29/2021) 180 capsule 3   aspirin EC 81 MG tablet Take 81 mg by mouth daily.     clopidogrel (PLAVIX) 75 MG tablet Take 75 mg by mouth daily.     dicyclomine (BENTYL) 10 MG capsule Take 1 capsule (10 mg total) by mouth every 8 (eight) hours as needed for spasms. (Patient not taking: Reported on 09/11/2021) 30 capsule 5   mupirocin ointment (BACTROBAN) 2 % Apply to left nostril three times daily for ten days. (Patient not taking: Reported on 09/29/2021) 22 g 0   nitroGLYCERIN (NITROSTAT) 0.4 MG SL tablet Place 0.4 mg under the tongue every 5 (five) minutes as needed for chest pain.      Results for orders placed or performed during the hospital encounter of 10/09/21 (from the past 48 hour(s))  Comprehensive metabolic panel per protocol     Status: Abnormal   Collection Time: 10/09/21 11:38 AM  Result Value Ref Range   Sodium 138 135 - 145 mmol/L   Potassium 3.5 3.5 - 5.1 mmol/L   Chloride 105 98 - 111 mmol/L   CO2 25 22 - 32 mmol/L   Glucose, Bld 87 70 - 99 mg/dL    Comment: Glucose reference range applies only to samples taken after fasting for at least 8 hours.   BUN 18 6 - 20 mg/dL  Creatinine, Ser 0.99 0.44 - 1.00 mg/dL   Calcium 9.0 8.9 - 10.3 mg/dL   Total Protein 6.6 6.5 - 8.1 g/dL    Albumin 4.2 3.5 - 5.0 g/dL   AST 21 15 - 41 U/L   ALT 17 0 - 44 U/L   Alkaline Phosphatase 33 (L) 38 - 126 U/L   Total Bilirubin 0.6 0.3 - 1.2 mg/dL   GFR, Estimated >60 >60 mL/min    Comment: (NOTE) Calculated using the CKD-EPI Creatinine Equation (2021)    Anion gap 8 5 - 15    Comment: Performed at [facility] [address], [place], [place] [address]  CBC per protocol     Status: Abnormal   Collection Time: 10/09/21 11:38 AM  Result Value Ref Range   WBC 5.1 4.0 - 10.5 K/uL   RBC 3.47 (L) 3.87 - 5.11 MIL/uL   Hemoglobin 11.2 (L) 12.0 - 15.0 g/dL   HCT 32.7 (L) 36.0 - 46.0 %   MCV 94.2 80.0 - 100.0 fL   MCH 32.3 26.0 - 34.0 pg   MCHC 34.3 30.0 - 36.0 g/dL   RDW 12.5 11.5 - 15.5 %   Platelets 243 150 - 400 K/uL   nRBC 0.0 0.0 - 0.2 %    Comment: Performed at [facility], [place] [address] [place], [place] [address]   No results found.  Review of Systems  All other systems reviewed and are negative.   Blood pressure (!) 149/79, pulse (!) 55, temperature 97.8 F (36.6 C), temperature source Oral, resp. rate 17, height '5\' 3"'$  (1.6 m), weight 49 kg, SpO2 99 %. Physical Exam Constitutional:      Appearance: Normal appearance. She is normal weight.  HENT:     Head: Normocephalic and atraumatic.     Right Ear: External ear normal.     Left Ear: External ear normal.     Nose: Nose normal.     Mouth/Throat:     Mouth: Mucous membranes are moist.     Pharynx: Oropharynx is clear.  Eyes:     Extraocular Movements: Extraocular movements intact.     Pupils: Pupils are equal, round, and reactive to light.  Cardiovascular:     Rate and Rhythm: Normal rate.  Pulmonary:     Effort: Pulmonary effort is normal.  Musculoskeletal:     Cervical back: Normal range of motion.  Skin:    General: Skin is warm and dry.  Neurological:     General: No focal deficit present.     Mental Status: She is alert and oriented to person, place, and time.  Psychiatric:        Mood  and Affect: Mood normal.        Behavior: Behavior normal.        Thought Content: Thought content normal.        Judgment: Judgment normal.      Assessment/Plan Dysphagia and esophageal stricture  To OR for esophagoscopy and dilation.  Melida Quitter, MD 10/09/2021, 1:24 PM

## 2021-10-09 NOTE — Transfer of Care (Signed)
Immediate Anesthesia Transfer of Care Note  Patient: Gabrielle Page  Procedure(s) Performed: RIGID ESOPHAGOSCOPY WITH DILATION (Bilateral)  Patient Location: PACU  Anesthesia Type:General  Level of Consciousness: drowsy, patient cooperative and responds to stimulation  Airway & Oxygen Therapy: Patient Spontanous Breathing and Patient connected to face mask oxygen  Post-op Assessment: Report given to RN, Post -op Vital signs reviewed and stable and Patient moving all extremities X 4  Post vital signs: Reviewed and stable  Last Vitals:  Vitals Value Taken Time  BP 95/44 10/09/21 1425  Temp    Pulse 50 10/09/21 1426  Resp 17 10/09/21 1426  SpO2 100 % 10/09/21 1426  Vitals shown include unvalidated device data.  Last Pain:  Vitals:   10/09/21 1201  TempSrc:   PainSc: 10-Worst pain ever      Patients Stated Pain Goal: 3 (70/26/37 8588)  Complications: No notable events documented.

## 2021-10-09 NOTE — Brief Op Note (Signed)
10/09/2021  2:09 PM  PATIENT:  Gabrielle Page  61 y.o. female  PRE-OPERATIVE DIAGNOSIS:  Pharyngoesophageal dysphagia Throat pain  POST-OPERATIVE DIAGNOSIS:  Pharyngoesophageal dysphagia Throat pain  PROCEDURE:  Procedure(s): RIGID ESOPHAGOSCOPY WITH DILATION (Bilateral)  SURGEON:  Surgeon(s) and Role:    Melida Quitter, MD - Primary  PHYSICIAN ASSISTANT:   ASSISTANTS: none   ANESTHESIA:   general  EBL:  None   BLOOD ADMINISTERED:none  DRAINS: none   LOCAL MEDICATIONS USED:  NONE  SPECIMEN:  No Specimen  DISPOSITION OF SPECIMEN:  N/A  COUNTS:  YES  TOURNIQUET:  * No tourniquets in log *  DICTATION: .Note written in EPIC  PLAN OF CARE: Discharge to home after PACU  PATIENT DISPOSITION:  PACU - hemodynamically stable.   Delay start of Pharmacological VTE agent (>24hrs) due to surgical blood loss or risk of bleeding: no

## 2021-10-09 NOTE — Anesthesia Procedure Notes (Signed)
Procedure Name: Intubation Date/Time: 10/09/2021 2:01 PM  Performed by: Rande Brunt, CRNAPre-anesthesia Checklist: Patient identified, Patient being monitored, Timeout performed, Emergency Drugs available and Suction available Patient Re-evaluated:Patient Re-evaluated prior to induction Oxygen Delivery Method: Circle system utilized Preoxygenation: Pre-oxygenation with 100% oxygen Induction Type: IV induction Ventilation: Mask ventilation without difficulty Laryngoscope Size: Miller and 2 Grade View: Grade I Tube type: Oral Tube size: 7.0 mm Number of attempts: 1 Airway Equipment and Method: Stylet Placement Confirmation: ETT inserted through vocal cords under direct vision, positive ETCO2 and breath sounds checked- equal and bilateral Secured at: 21 cm Tube secured with: Tape Dental Injury: Teeth and Oropharynx as per pre-operative assessment

## 2021-10-09 NOTE — Discharge Instructions (Addendum)
Resume Plavix tomorrow.

## 2021-10-10 ENCOUNTER — Encounter (HOSPITAL_COMMUNITY): Payer: Self-pay | Admitting: Otolaryngology

## 2022-03-09 ENCOUNTER — Other Ambulatory Visit: Payer: Self-pay | Admitting: Cardiology

## 2022-07-23 IMAGING — RF DG ESOPHAGUS
14 of 21 series · 14 of 24 positions shown · non-contrast
Comparison: CT imaging from February 2021.

CLINICAL DATA: Dysphagia, GERD and abdominal pain in a 60-year-old
female.

EXAM:
ESOPHOGRAM / BARIUM SWALLOW / BARIUM TABLET STUDY
TECHNIQUE: Combined double contrast and single contrast examination performed
using effervescent crystals, thick barium liquid, and thin barium
liquid. The patient was observed with fluoroscopy swallowing a 13 mm
barium sulphate tablet.
FLUOROSCOPY:
Radiation Exposure Index (as provided by the fluoroscopic device):
11.6 mGy Kerma

[Series 1: cp_standard · 0.35mm/px · 1 of 26 frames shown (1 of 14)]
[frame 4/26]
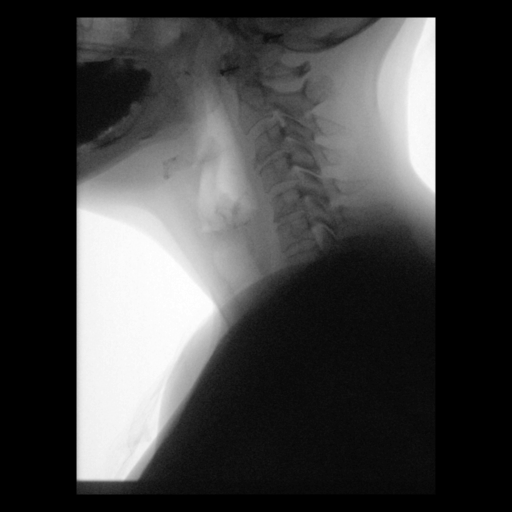

[Series 2: cp_standard · 0.35mm/px · 1 of 42 frames shown (2 of 14)]
[frame 22/42]
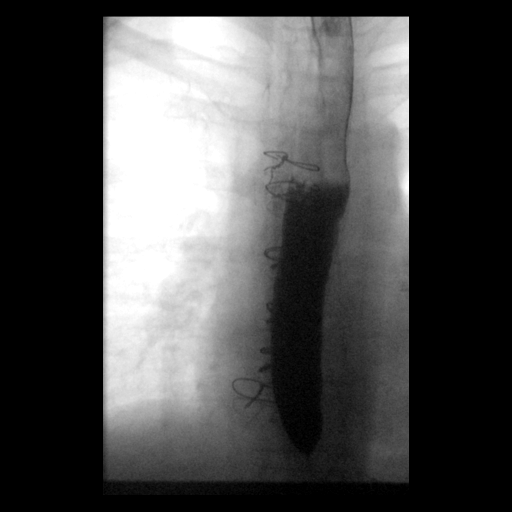

[Series 5: cp_standard · 0.36mm/px · 1 of 3 frames shown (3 of 14)]
[frame 1/3]
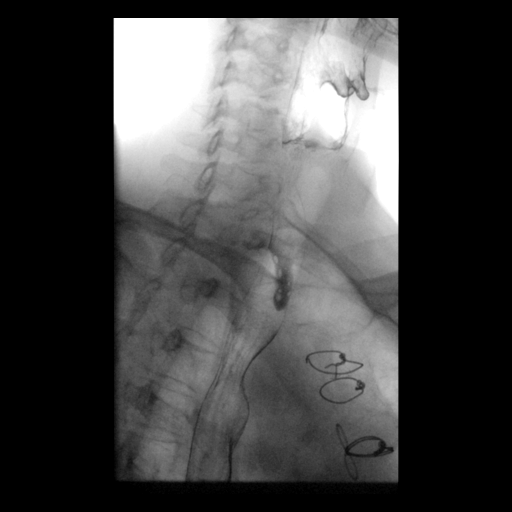

[Series 6: cp_standard · 0.36mm/px · 1 of 20 frames shown (4 of 14)]
[frame 18/20]
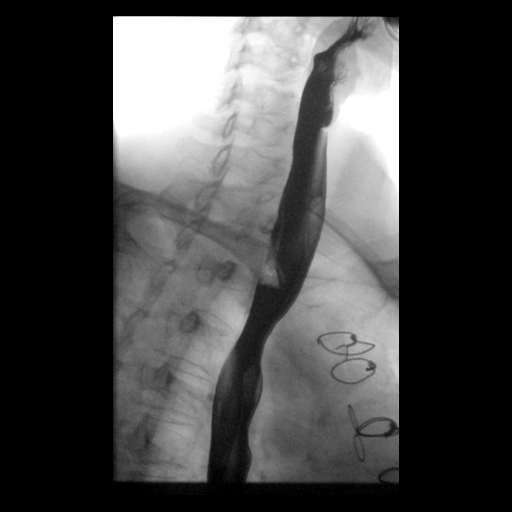

[Series 7: cp_standard · 0.36mm/px · 1 of 16 frames shown (5 of 14)]
[frame 13/16]
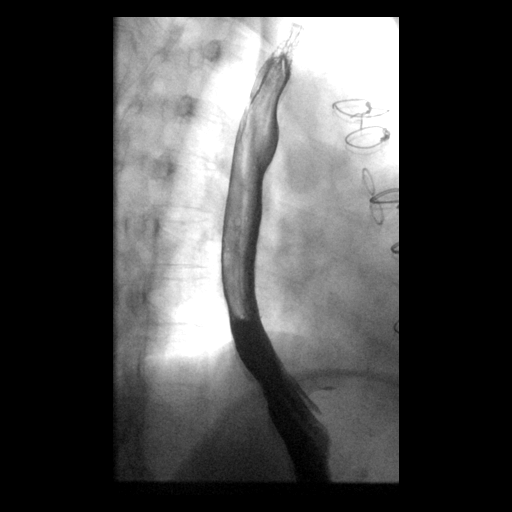

[Series 10: cp_standard · 0.36mm/px · 1 of 3 frames shown (6 of 14)]
[frame 3/3]
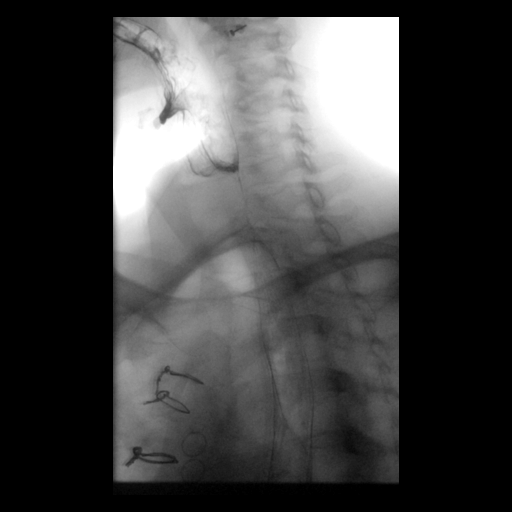

[Series 12: cp_standard · 0.36mm/px · 1 of 28 frames shown (7 of 14)]
[frame 15/28]
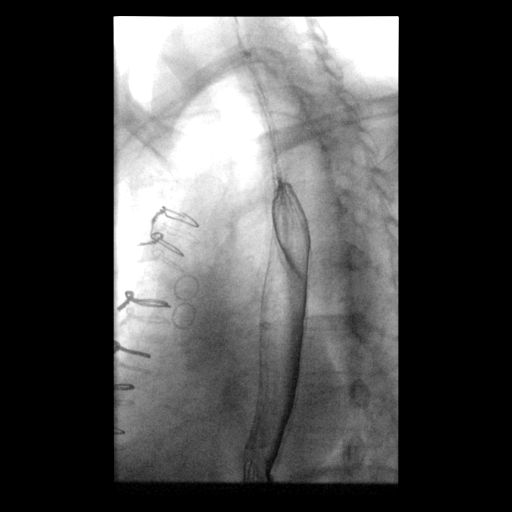

[Series 13: cp_standard · 0.36mm/px · 1 of 10 frames shown (8 of 14)]
[frame 9/10]
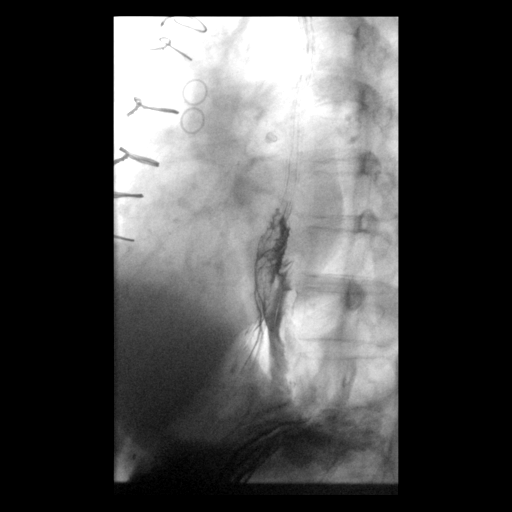

[Series 15: cp_standard · 0.36mm/px · 1 of 8 frames shown (9 of 14)]
[frame 5/8]
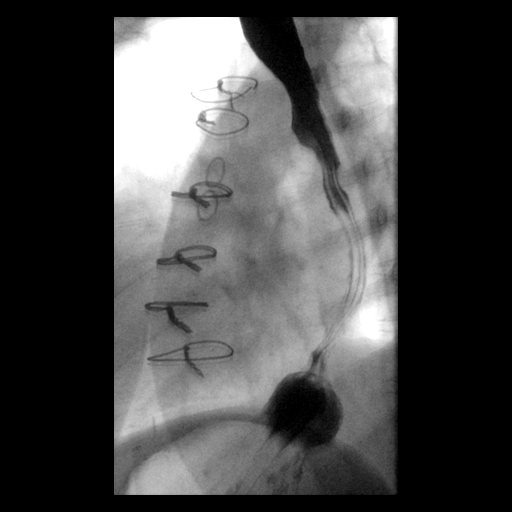

[Series 17: cp_standard · 0.18mm/px · 1 of 1 slices shown (10 of 14)]
[im 1/1]
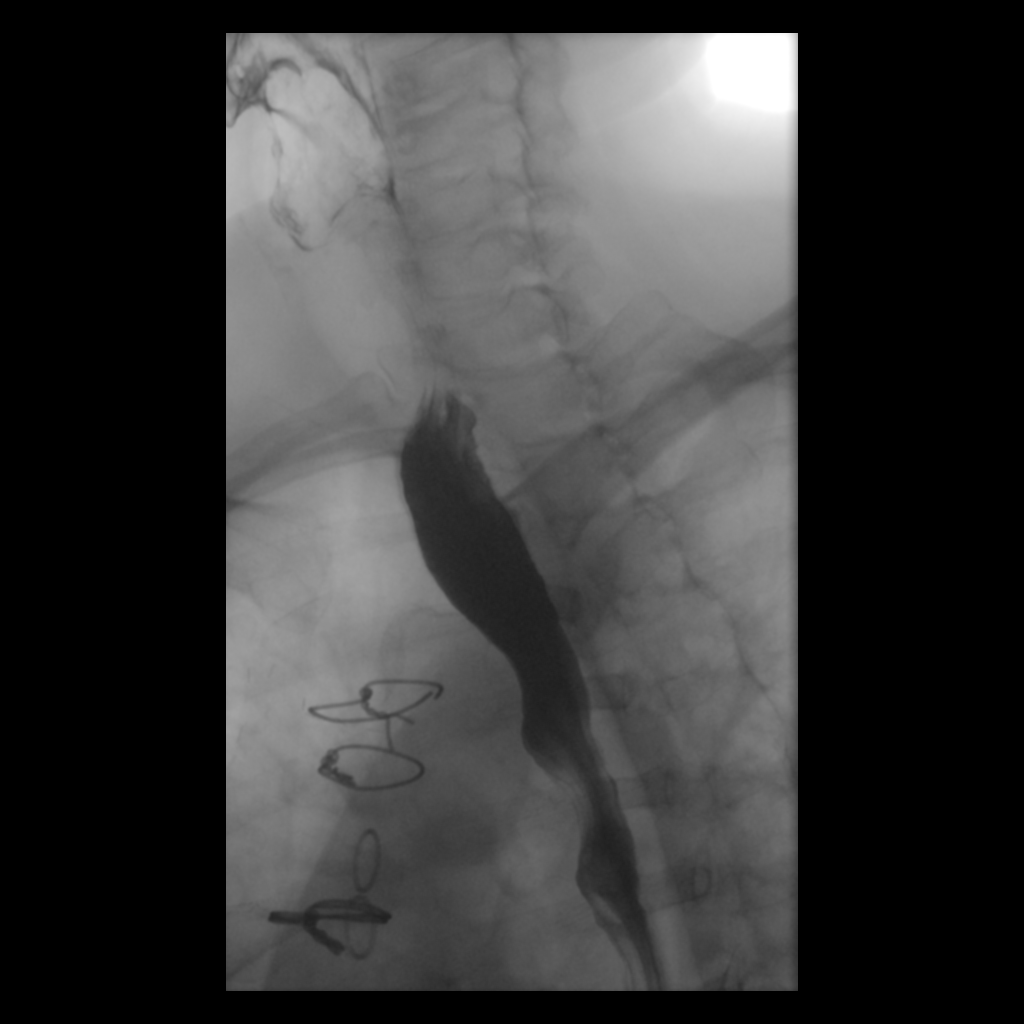

[Series 19: cp_standard · 0.37mm/px · 1 of 7 frames shown (11 of 14)]
[frame 3/7]
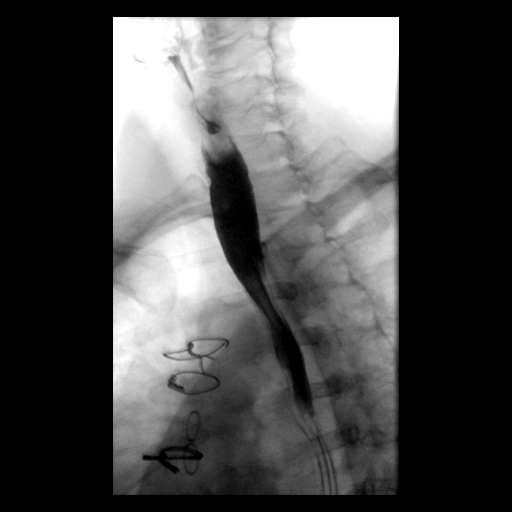

[Series 20: cp_standard · 0.37mm/px · 1 of 5 frames shown (12 of 14)]
[frame 1/5]
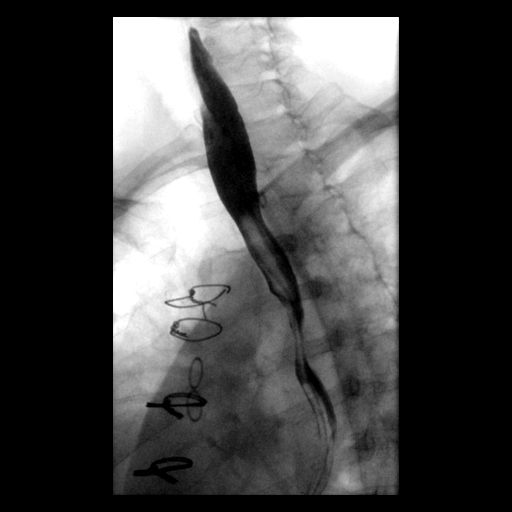

[Series 22: cp_standard · 0.37mm/px · 1 of 53 frames shown (13 of 14)]
[frame 46/53]
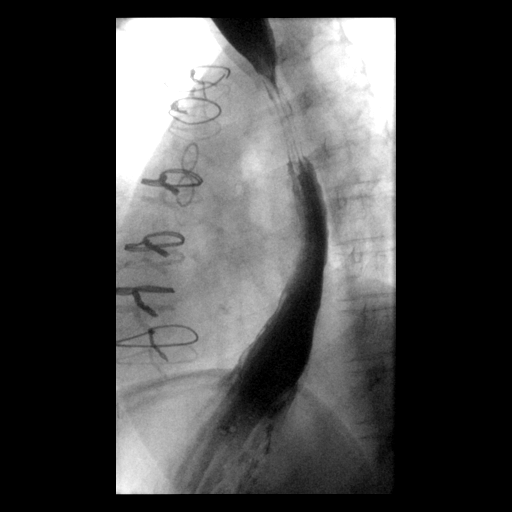

[Series 24: cp_standard · 0.37mm/px · 1 of 38 frames shown (14 of 14)]
[frame 34/38]
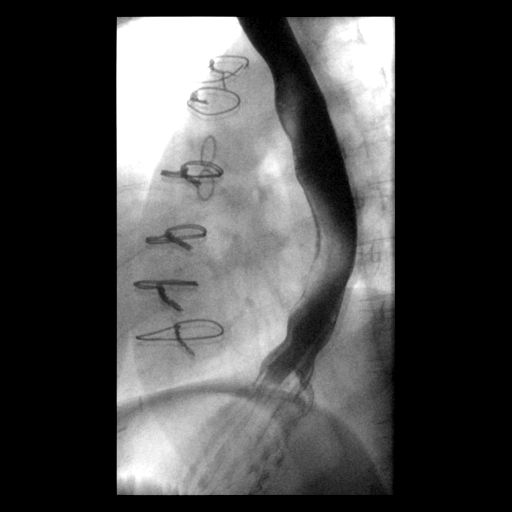

[14 of 24 positions shown; findings below may reference images not displayed]

FINDINGS: Swallowing first performed in the lateral projection showing no
gross swallow dysfunction. A thin indentation in the barium contrast
column at the level of the cricopharyngeus muscle is seen anteriorly
measuring approximately 4-5 mm. Not visible on the AP view, seen
only on the lateral projection.

Effervescent crystals and thick barium within administered showing
normal esophageal distensibility aside from above finding.

No gross lesion or signs of stricture with moderate size sliding
type hiatal hernia. Distal esophagus is widely patent.

Single swallow with primary wave showing incomplete stripping with
greater than 50% of the bolus showing stasis in the proximal
esophagus which failed to clear with subsequent swallowing.

Full column assessment shows normal distensibility of the esophagus
with sliding type hiatal hernia.

Barium tablet passed without difficulty. Gastroesophageal reflux
seen to the level of the mid esophagus during provocative maneuvers
IMPRESSION: 1. Findings of suspected esophageal web in the proximal esophagus.
Direct visualization may be helpful.
2. Moderate size hiatal hernia without distal esophageal narrowing
and with signs of gastroesophageal reflux to the mid esophagus.
3. Signs of dysmotility with incomplete esophageal stripping which
fails to clear after multiple dry swallows.

## 2022-11-02 ENCOUNTER — Other Ambulatory Visit: Payer: Self-pay | Admitting: Cardiology
